# Patient Record
Sex: Female | Born: 1965 | Race: Black or African American | Hispanic: No | Marital: Single | Smoking: Former smoker
Health system: Southern US, Community
[De-identification: ages and names within clinical notes are randomized; demographics above are authoritative.]

## PROBLEM LIST (undated history)

## (undated) DIAGNOSIS — F419 Anxiety disorder, unspecified: Secondary | ICD-10-CM

## (undated) DIAGNOSIS — E785 Hyperlipidemia, unspecified: Secondary | ICD-10-CM

## (undated) DIAGNOSIS — I10 Essential (primary) hypertension: Secondary | ICD-10-CM

## (undated) DIAGNOSIS — H547 Unspecified visual loss: Secondary | ICD-10-CM

## (undated) DIAGNOSIS — C801 Malignant (primary) neoplasm, unspecified: Secondary | ICD-10-CM

## (undated) DIAGNOSIS — I639 Cerebral infarction, unspecified: Secondary | ICD-10-CM

## (undated) HISTORY — PX: HERNIA REPAIR: SHX51

## (undated) HISTORY — DX: Unspecified visual loss: H54.7

## (undated) HISTORY — DX: Cerebral infarction, unspecified: I63.9

---

## 1999-09-22 ENCOUNTER — Ambulatory Visit (HOSPITAL_COMMUNITY): Admission: RE | Admit: 1999-09-22 | Discharge: 1999-09-22 | Payer: Self-pay | Admitting: General Surgery

## 1999-09-22 ENCOUNTER — Encounter: Payer: Self-pay | Admitting: General Surgery

## 1999-10-20 ENCOUNTER — Ambulatory Visit (HOSPITAL_COMMUNITY): Admission: RE | Admit: 1999-10-20 | Discharge: 1999-10-21 | Payer: Self-pay | Admitting: General Surgery

## 2000-02-06 ENCOUNTER — Emergency Department (HOSPITAL_COMMUNITY): Admission: EM | Admit: 2000-02-06 | Discharge: 2000-02-06 | Payer: Self-pay | Admitting: Emergency Medicine

## 2000-09-22 ENCOUNTER — Emergency Department (HOSPITAL_COMMUNITY): Admission: EM | Admit: 2000-09-22 | Discharge: 2000-09-22 | Payer: Self-pay | Admitting: Emergency Medicine

## 2001-05-15 ENCOUNTER — Emergency Department (HOSPITAL_COMMUNITY): Admission: EM | Admit: 2001-05-15 | Discharge: 2001-05-15 | Payer: Self-pay | Admitting: *Deleted

## 2005-07-10 ENCOUNTER — Emergency Department (HOSPITAL_COMMUNITY): Admission: EM | Admit: 2005-07-10 | Discharge: 2005-07-10 | Payer: Self-pay | Admitting: Emergency Medicine

## 2005-08-11 ENCOUNTER — Emergency Department (HOSPITAL_COMMUNITY): Admission: EM | Admit: 2005-08-11 | Discharge: 2005-08-11 | Payer: Self-pay | Admitting: Emergency Medicine

## 2007-06-07 ENCOUNTER — Encounter: Payer: Self-pay | Admitting: Pulmonary Disease

## 2007-06-07 DIAGNOSIS — R911 Solitary pulmonary nodule: Secondary | ICD-10-CM | POA: Insufficient documentation

## 2007-06-07 DIAGNOSIS — J984 Other disorders of lung: Secondary | ICD-10-CM | POA: Insufficient documentation

## 2007-07-11 ENCOUNTER — Ambulatory Visit: Payer: Self-pay | Admitting: Internal Medicine

## 2007-07-11 DIAGNOSIS — C3490 Malignant neoplasm of unspecified part of unspecified bronchus or lung: Secondary | ICD-10-CM | POA: Insufficient documentation

## 2007-07-11 DIAGNOSIS — Z87891 Personal history of nicotine dependence: Secondary | ICD-10-CM | POA: Insufficient documentation

## 2007-07-15 ENCOUNTER — Ambulatory Visit: Payer: Self-pay | Admitting: Cardiovascular Disease

## 2007-08-24 ENCOUNTER — Emergency Department (HOSPITAL_COMMUNITY): Admission: EM | Admit: 2007-08-24 | Discharge: 2007-08-24 | Payer: Self-pay | Admitting: Emergency Medicine

## 2007-08-30 ENCOUNTER — Emergency Department (HOSPITAL_COMMUNITY): Admission: EM | Admit: 2007-08-30 | Discharge: 2007-08-30 | Payer: Self-pay | Admitting: Emergency Medicine

## 2009-02-25 ENCOUNTER — Emergency Department (HOSPITAL_COMMUNITY): Admission: EM | Admit: 2009-02-25 | Discharge: 2009-02-25 | Payer: Self-pay | Admitting: Emergency Medicine

## 2009-05-01 ENCOUNTER — Emergency Department (HOSPITAL_COMMUNITY): Admission: EM | Admit: 2009-05-01 | Discharge: 2009-05-01 | Payer: Self-pay | Admitting: Emergency Medicine

## 2010-02-13 ENCOUNTER — Emergency Department (HOSPITAL_COMMUNITY)
Admission: EM | Admit: 2010-02-13 | Discharge: 2010-02-13 | Payer: Self-pay | Source: Home / Self Care | Admitting: Emergency Medicine

## 2010-03-02 ENCOUNTER — Encounter: Payer: Self-pay | Admitting: Obstetrics & Gynecology

## 2010-05-03 ENCOUNTER — Inpatient Hospital Stay (HOSPITAL_COMMUNITY)
Admission: AD | Admit: 2010-05-03 | Discharge: 2010-05-03 | Disposition: A | Discharge status: Home / Self Care | Payer: Self-pay | Source: Ambulatory Visit | Attending: Obstetrics & Gynecology | Admitting: Obstetrics & Gynecology

## 2010-05-03 DIAGNOSIS — IMO0002 Reserved for concepts with insufficient information to code with codable children: Secondary | ICD-10-CM | POA: Insufficient documentation

## 2010-05-03 DIAGNOSIS — R109 Unspecified abdominal pain: Secondary | ICD-10-CM | POA: Insufficient documentation

## 2010-05-03 LAB — COMPREHENSIVE METABOLIC PANEL
Alkaline Phosphatase: 61 U/L (ref 39–117)
BUN: 8 mg/dL (ref 6–23)
CO2: 26 mEq/L (ref 19–32)
GFR calc non Af Amer: >60 mL/min (ref >60)
Glucose, Bld: 87 mg/dL (ref 70–99)
Potassium: 4.3 mEq/L (ref 3.5–5.1)
Total Bilirubin: 0.4 mg/dL (ref 0.3–1.2)
Total Protein: 6.8 g/dL (ref 6.0–8.3)

## 2010-05-03 LAB — URINALYSIS, ROUTINE W REFLEX MICROSCOPIC
Leukocytes, UA: NEGATIVE
Nitrite: NEGATIVE
Specific Gravity, Urine: 1.01 (ref 1.005–1.030)
pH: 5 (ref 5.0–8.0)

## 2010-05-03 LAB — CBC
HCT: 38.1 % (ref 36.0–46.0)
MCV: 91.6 fL (ref 78.0–100.0)
RDW: 13.8 % (ref 11.5–15.5)
WBC: 6.1 10*3/uL (ref 4.0–10.5)

## 2010-05-03 LAB — POCT PREGNANCY, URINE: Preg Test, Ur: NEGATIVE

## 2010-05-03 LAB — URINE MICROSCOPIC-ADD ON

## 2010-05-05 LAB — CBC
Hemoglobin: 12.5 g/dL (ref 12.0–15.0)
RBC: 4.03 MIL/uL (ref 3.87–5.11)
WBC: 5.1 10*3/uL (ref 4.0–10.5)

## 2010-05-05 LAB — DIFFERENTIAL
Lymphocytes Relative: 14 % (ref 12–46)
Monocytes Absolute: 0.6 10*3/uL (ref 0.1–1.0)
Monocytes Relative: 11 % (ref 3–12)
Neutro Abs: 3.7 10*3/uL (ref 1.7–7.7)

## 2010-05-05 LAB — BASIC METABOLIC PANEL
CO2: 26 mEq/L (ref 19–32)
Calcium: 9.1 mg/dL (ref 8.4–10.5)
GFR calc Af Amer: >60 mL/min (ref >60)
GFR calc non Af Amer: >60 mL/min (ref >60)
Sodium: 134 mEq/L — ABNORMAL LOW (ref 135–145)

## 2010-06-27 NOTE — Op Note (Signed)
[place]  Patient:    Andrea Mullen, Andrea Mullen                       MRN: [id number] Proc. Date: 10/20/99 Adm. Date:  [id number] Attending:  Brandy Hale                           Operative Report  PREOPERATIVE DIAGNOSIS:  Ventral incisional hernia.  POSTOPERATIVE DIAGNOSIS:  Incarcerated ventral incisional hernia.  OPERATION PERFORMED:  Laparoscopic repair of incarcerated ventral incisional hernia with dual mesh.  SURGEON:  Angelia Mould. Derrell Lolling, M.D.  ASSISTANT:  Adolph Pollack, M.D.  ANESTHESIA:  INDICATIONS FOR PROCEDURE:  This is a 45 year old black female who has had eight pregnancies and has seven children.  She has had three cesarean sections in the past.  She presents with abdominal pain around her umbilicus.  On exam, she has a ventral hernia at the umbilicus and below the umbilicus associated with the lower midline incision which extends up to the umbilicus.  Options for intervention have been discussed.  We have decided to go ahead with the laparoscopic repair of her ventral incisional hernia.  OPERATIVE FINDINGS:  The patient had an incarcerated ventral hernia with the defect approximately 3 to 4 cm in diameter and there was incarcerated omentum in the hernia.  The large intestine, small intestine, stomach and liver looked normal.  The lower midline looked perfectly intact and I saw no other hernias anywhere.  DESCRIPTION OF PROCEDURE:  Following the induction of general endotracheal anesthesia, a Foley catheter was inserted.  The abdomen was prepped and draped in a sterile fashion.  0.5% Marcaine with epinephrine was used as a local infiltration anesthetic.  A transverse incision was made in the far lateral left abdominal wall.  Dissection was carried down to the external oblique fascia which was incised transversely.  The internal oblique muscle was retracted.  The transversalis muscle was incised and divided.  Peritoneum  was elevated and we entered the abdominal cavity under direct vision.  A 10 mm Hasson trocar was inserted and secured with a pursestring suture of 0 Vicryl. Pneumoperitoneum was created.  A video camera was inserted with visualization and the findings were as described above.  A 5 mm trocar was placed in the left upper quadrant and a 5 mm trocar placed in the right upper quadrant.  The incarcerated omentum was dissected out of the hernia sac and returned to the abdominal cavity.  We had no significant bleeding from the omentum.  Using spinal needles, we marked out the hernia boundaries and in fact marked it out quite widely.  We made sure that we had used at least 3 or 4 cm of mesh beyond the hernia sac on all sides.  We basically used a 15 cm x 19 cm elliptical piece of mesh.  Externally, the skin and the mesh were marked with a marking pen to orient [place] to the 12 oclock, 3 oclock, 6 oclock and 9 oclock positions.  Mattress sutures of 0 Novofil were placed on the rough side of the mesh in these four locations.  The mesh was rolled up and then inserted into the abdominal cavity.  With video guidance, the mesh was spread out until it was oriented as it had been marked.  Great care was taken to put the rough side of the mesh on the abdominal wall to keep the smooth side toward  the intestine.  We made small incisions at the 12 oclock, 3 oclock, 6 oclock and 9 oclock positions outside of the perimeter of the mesh.  Using the Endoclose device, we brought the ends of the Novofil suture through all four sites and then tied these securely which suspended the mesh against the abdominal wall.  We used two 5 mm tackers firing about 45 to 50 tacks to secure the mesh further all the way around its perimeter and then an inner rim of tacks was placed.  This made two concentric circles of tacks and spread the mesh out quite nicely and securely well over the hernia defect.  Abdominal and pelvic cavities were  inspected.  There was no bleeding or other abnormality. Trocars were removed under direct vision.  There was no bleeding from the trocar sites.  The pneumoperitoneum was released under direct vision.  After all the pneumoperitoneum was released, all of the large trocar was removed. The fascia in the left abdominal wall was closed with 0 Vicryl suture.  All the incisions were closed with subcuticular sutures of 4-0 Vicryl and Steri-Strips.  Clean bandages were placed and the patient taken to the recovery room in stable condition.  Estimated blood loss was about 15 to 20 cc.  Complications were none.  Sponge, needle and instrument counts were correct. DD:  10/20/99 TD:  10/20/99 Job: [id number]

## 2010-12-07 ENCOUNTER — Emergency Department (HOSPITAL_COMMUNITY)
Admission: EM | Admit: 2010-12-07 | Discharge: 2010-12-08 | Discharge status: Against Medical Advice | Payer: Medicaid Other | Attending: Emergency Medicine | Admitting: Emergency Medicine

## 2010-12-07 DIAGNOSIS — R059 Cough, unspecified: Secondary | ICD-10-CM | POA: Insufficient documentation

## 2010-12-07 DIAGNOSIS — R0602 Shortness of breath: Secondary | ICD-10-CM | POA: Insufficient documentation

## 2010-12-07 DIAGNOSIS — R05 Cough: Secondary | ICD-10-CM | POA: Insufficient documentation

## 2010-12-07 DIAGNOSIS — R509 Fever, unspecified: Secondary | ICD-10-CM | POA: Insufficient documentation

## 2010-12-07 DIAGNOSIS — R0989 Other specified symptoms and signs involving the circulatory and respiratory systems: Secondary | ICD-10-CM | POA: Insufficient documentation

## 2010-12-08 ENCOUNTER — Emergency Department (HOSPITAL_COMMUNITY): Payer: Medicaid Other

## 2011-06-01 ENCOUNTER — Encounter (HOSPITAL_COMMUNITY): Payer: Self-pay | Admitting: Emergency Medicine

## 2011-06-01 ENCOUNTER — Emergency Department (HOSPITAL_COMMUNITY): Payer: Medicaid Other

## 2011-06-01 ENCOUNTER — Emergency Department (HOSPITAL_COMMUNITY)
Admission: EM | Admit: 2011-06-01 | Discharge: 2011-06-01 | Disposition: A | Discharge status: Home / Self Care | Payer: Medicaid Other | Attending: Emergency Medicine | Admitting: Emergency Medicine

## 2011-06-01 DIAGNOSIS — R079 Chest pain, unspecified: Secondary | ICD-10-CM | POA: Insufficient documentation

## 2011-06-01 DIAGNOSIS — F419 Anxiety disorder, unspecified: Secondary | ICD-10-CM

## 2011-06-01 DIAGNOSIS — F17200 Nicotine dependence, unspecified, uncomplicated: Secondary | ICD-10-CM | POA: Insufficient documentation

## 2011-06-01 DIAGNOSIS — J45909 Unspecified asthma, uncomplicated: Secondary | ICD-10-CM | POA: Insufficient documentation

## 2011-06-01 DIAGNOSIS — F411 Generalized anxiety disorder: Secondary | ICD-10-CM | POA: Insufficient documentation

## 2011-06-01 DIAGNOSIS — Z85118 Personal history of other malignant neoplasm of bronchus and lung: Secondary | ICD-10-CM | POA: Insufficient documentation

## 2011-06-01 DIAGNOSIS — R42 Dizziness and giddiness: Secondary | ICD-10-CM | POA: Insufficient documentation

## 2011-06-01 DIAGNOSIS — R51 Headache: Secondary | ICD-10-CM | POA: Insufficient documentation

## 2011-06-01 HISTORY — DX: Anxiety disorder, unspecified: F41.9

## 2011-06-01 HISTORY — DX: Malignant (primary) neoplasm, unspecified: C80.1

## 2011-06-01 LAB — CBC
HCT: 37.7 % (ref 36.0–46.0)
Hemoglobin: 13.2 g/dL (ref 12.0–15.0)
MCH: 30.9 pg (ref 26.0–34.0)
MCHC: 35 g/dL (ref 30.0–36.0)
RBC: 4.27 MIL/uL (ref 3.87–5.11)

## 2011-06-01 LAB — BASIC METABOLIC PANEL
BUN: 8 mg/dL (ref 6–23)
CO2: 25 mEq/L (ref 19–32)
GFR calc non Af Amer: 82 mL/min — ABNORMAL LOW (ref >90)
Glucose, Bld: 99 mg/dL (ref 70–99)
Potassium: 3.9 mEq/L (ref 3.5–5.1)

## 2011-06-01 LAB — URINE DRUGS OF ABUSE SCREEN W ALC, ROUTINE (REF LAB)
Barbiturate Quant, Ur: NEGATIVE
Benzodiazepines.: NEGATIVE
Creatinine,U: 118.3 mg/dL
Marijuana Metabolite: NEGATIVE
Methadone: NEGATIVE
Phencyclidine (PCP): NEGATIVE

## 2011-06-01 LAB — POCT I-STAT TROPONIN I

## 2011-06-01 MED ORDER — ASPIRIN 81 MG PO CHEW
324.0000 mg | CHEWABLE_TABLET | Freq: Once | ORAL | Status: AC
  Administered 2011-06-01: 324 mg via ORAL
  Filled 2011-06-01: qty 4

## 2011-06-01 NOTE — ED Notes (Signed)
Pt d/c home in NAD. Pt voiced understanding of d/c instructions and follow up care. Pt ambulated with quick, steady gait and is no longer c/o pain.

## 2011-06-01 NOTE — Discharge Instructions (Signed)
Read instructions below for reasons to return to the Emergency Department. It is recommended that your follow up with your Primary Care Doctor in regards to today's visit.   Chest Pain (Nonspecific)  HOME CARE INSTRUCTIONS  For the next few days, avoid physical activities that bring on chest pain. Continue physical activities as directed.  Do not smoke cigarettes or drink alcohol until your symptoms are gone.  Only take over-the-counter or prescription medicine for pain, discomfort, or fever as directed by your caregiver.  Follow your caregiver's suggestions for further testing if your chest pain does not go away.  Keep any follow-up appointments you made. If you do not go to an appointment, you could develop lasting (chronic) problems with pain. If there is any problem keeping an appointment, you must call to reschedule.  SEEK MEDICAL CARE IF:  You think you are having problems from the medicine you are taking. Read your medicine instructions carefully.  Your chest pain does not go away, even after treatment.  You develop a rash with blisters on your chest.  SEEK IMMEDIATE MEDICAL CARE IF:  You have increased chest pain or pain that spreads to your arm, neck, jaw, back, or belly (abdomen).  You develop shortness of breath, an increasing cough, or you are coughing up blood.  You have severe back or abdominal pain, feel sick to your stomach (nauseous) or throw up (vomit).  You develop severe weakness, fainting, or chills.  You have an oral temperature above 102 F (38.9 C), not controlled by medicine.   THIS IS AN EMERGENCY. Do not wait to see if the pain will go away. Get medical help at once. Call your local emergency services (911 in [place] Do not drive yourself to the hospital.   RESOURCE GUIDE  Dental Problems  Patients with Medicaid: [facility]                      Dental [address]                                           [address] Phone:  [phone]                                                  Phone:  [phone]  If unable to pay or uninsured, contact:  [facility] or [facility] to become qualified for the adult dental clinic.  Chronic Pain Problems Contact [facility]  [phone] Patients need to be referred by their primary care doctor.  Insufficient Money for Medicine Contact [employer]:  call "211" or [facility] 271-5999.  No Primary Care Doctor Call [facility]  832-8000 Other agencies that provide inexpensive medical care    [facility]  832-8035    [facility]  832-7272    [facility]  [phone]    Women[facility] Clinic  832-4777    [facility]  [phone]    [facility]  [phone]  Psychological Services [facility]  [phone] [facility]  [phone] [facility]   800 853-5163 (emergency services 641-4993)  Substance Abuse Resources [facility]    336-882-2125 [facility] 336-784-9470 [facility] 336-285-9073 [facility] 336-845-3988 Residential & Outpatient Substance Abuse Program  800-659-3381  Abuse/Neglect [place] Child Abuse Hotline (336) 641-3795 [place] Child Abuse Hotline 800-378-5315 (After Hours)  Emergency Shelter [facility] (336) 271-5985  [facility] (336) 275-9566 [facility] (704) 372-4663  MRSA Hotline #:   832-7006    [place] [facility]  [facility] [facility]     [employer]                          [facility] [address] [place]                       [address]      [address]  [place]                                                [place]                            [place] Phone:  [phone]                                   Phone:  [phone]                  Phone:  [phone]  [facility] Phone:  [phone]  [place] Child Abuse Hotline (336) 342-1394 (336) 342-3537 (After Hours)   

## 2011-06-01 NOTE — ED Provider Notes (Signed)
Medical screening examination/treatment/procedure(s) were performed by non-physician practitioner and as supervising physician I was immediately available for consultation/collaboration.  Devondre Guzzetta, MD 06/01/11 1621 

## 2011-06-01 NOTE — ED Notes (Signed)
Per ems- pt having complaints of headache x1 day. Left sided chest pain in triage. Upon arrival pt began complaining of upper abdominal cramping. Pt recently ran out of meds.

## 2011-06-01 NOTE — ED Notes (Addendum)
Pt c/o headache, abd cramping, and pain under left breast. Pt. States she hasnt had Xanax in 2 months and requesting to be seen for anxiety and states "that may be part of the problem." Pt states that she almost passed out last night, was "seeing white spots" also states that she does not use drugs but "sells and touches a lot of cocaine".

## 2011-06-01 NOTE — ED Provider Notes (Signed)
History     CSN: [id number]  Arrival date & time 06/01/11  1126   First MD Initiated Contact with Patient 06/01/11 1137      Chief Complaint  Patient presents with  . Headache    (Consider location/radiation/quality/duration/timing/severity/associated sxs/prior treatment) Patient is a 46 y.o. female presenting with headaches and chest pain. The history is provided by the patient.  Headache  This is a new problem. The current episode started 12 to 24 hours ago. The problem occurs every few hours. The problem has been gradually improving. The headache is associated with nothing. The pain is located in the frontal and temporal region. The quality of the pain is described as throbbing. The pain is at a severity of 7/10. The pain is mild. The pain does not radiate. Pertinent negatives include no anorexia, no fever, no malaise/fatigue, no chest pressure, no near-syncope, no orthopnea, no palpitations, no syncope, no shortness of breath, no nausea and no vomiting. Associated symptoms comments: Denies cough, hemoptysis, current cancer, recent surgery. .  Chest Pain The chest pain began 3 - 5 hours ago. Duration of episode(s) is 5 minutes. Chest pain occurs constantly. The chest pain is resolved. Associated with: non exertional, came on after drinking a cup of coffee. At its most intense, the pain is at 6/10. The pain is currently at 0/10. The severity of the pain is mild. The quality of the pain is described as squeezing and tightness. The pain does not radiate. Primary symptoms include dizziness. Pertinent negatives for primary symptoms include no fever, no fatigue, no syncope, no shortness of breath, no cough, no wheezing, no palpitations, no abdominal pain, no nausea, no vomiting and no altered mental status.  Dizziness does not occur with nausea, vomiting, weakness or diaphoresis.  Pertinent negatives for associated symptoms include no claudication, no diaphoresis, no lower extremity edema, no  near-syncope, no numbness, no orthopnea, no paroxysmal nocturnal dyspnea and no weakness. She tried nothing for the symptoms. Risk factors include obesity, lack of exercise and substance abuse.  Her past medical history is significant for cancer (chemo and radiation, treated lung cancer).  Pertinent negatives for past medical history include no COPD, no CHF, no DVT, no PE and no PVD.     Past Medical History  Diagnosis Date  . Cancer   . Asthma   . Anxiety     Past Surgical History  Procedure Date  . Hernia repair   . Cesarean section     No family history on file.  History  Substance Use Topics  . Smoking status: Current Everyday Smoker    Types: Cigarettes  . Smokeless tobacco: Not on file  . Alcohol Use: 3.6 oz/week    6 Cans of beer per week    OB History    Grav Para Term Preterm Abortions TAB SAB Ect Mult Living                  Review of Systems  Constitutional: Negative for fever, malaise/fatigue, diaphoresis and fatigue.  HENT: Negative for neck stiffness and ear discharge.   Respiratory: Negative for cough, shortness of breath and wheezing.   Cardiovascular: Positive for chest pain. Negative for palpitations, orthopnea, claudication, leg swelling, syncope and near-syncope.  Gastrointestinal: Negative for nausea, vomiting, abdominal pain, diarrhea, constipation and anorexia.  Neurological: Positive for dizziness and headaches. Negative for weakness and numbness.  Psychiatric/Behavioral: Negative for altered mental status.  All other systems reviewed and are negative.    Allergies  Bicillin c-r  Home Medications   Current Outpatient Rx  Name Route Sig Dispense Refill  . ALBUTEROL SULFATE HFA 108 (90 BASE) MCG/ACT IN AERS Inhalation Inhale 2 puffs into the lungs every 6 (six) hours as needed. For shortness of breath    . FLEXERIL PO Oral Take 1 tablet by mouth at bedtime as needed. For muscle spasms    . DOCUSATE SODIUM 100 MG PO CAPS Oral Take 100 mg  by mouth daily.    Marland Kitchen FERROUS SULFATE 325 (65 FE) MG PO TABS Oral Take 325 mg by mouth daily.    Marland Kitchen OMEPRAZOLE 20 MG PO CPDR Oral Take 20 mg by mouth daily as needed. For acid reflux      BP 108/67  Pulse 74  Resp 14  SpO2 100%  LMP 05/13/2011  Physical Exam  Nursing note and vitals reviewed. Constitutional: She appears well-developed and well-nourished. No distress.  HENT:  Head: Normocephalic and atraumatic.  Eyes: Conjunctivae and EOM are normal. Pupils are equal, round, and reactive to light.  Neck: Normal range of motion. Neck supple. Normal carotid pulses and no JVD present. Carotid bruit is not present. No rigidity. Normal range of motion present.  Cardiovascular: Normal rate, regular rhythm, S1 normal, S2 normal, normal heart sounds, intact distal pulses and normal pulses.  Exam reveals no gallop and no friction rub.   No murmur heard.      No pitting edema bilaterally, RRR, no aberrant sounds on auscultations, distal pulses intact, no carotid bruit or JVD.   Pulmonary/Chest: Effort normal and breath sounds normal. No accessory muscle usage or stridor. No respiratory distress. She exhibits no tenderness and no bony tenderness.  Abdominal: Bowel sounds are normal.       Obese, soft non tender. Non pulsatile aorta.   Neurological:       Cranial nerves III through XII intact, no bidirectional or vertical nystagmus, coordination and distal sensation intact, normal gait without ataxia.  Skin: Skin is warm, dry and intact. No rash noted. She is not diaphoretic. No cyanosis. Nails show no clubbing.    ED Course  Procedures (including critical care time)   Labs Reviewed  DRUGS OF ABUSE SCREEN W ALC, ROUTINE URINE  CBC  BASIC METABOLIC PANEL   No results found.   No diagnosis found.   Date: 06/01/2011  Rate: 82  Rhythm: normal sinus rhythm  QRS Axis: normal  Intervals: normal  ST/T Wave abnormalities: normal  Conduction Disutrbances: none  Narrative Interpretation:    Old EKG Reviewed: No significant changes noted     MDM  CP, HA, Anxiety   Patient was completely asymptomatic throughout her hospital stay in emergency department.  Symptoms began this morning and were intermittent however have since resolved.  No abnormalities found on physical exam.  Patient did not have a cardiac history nor a family history of early cardiac events.  Presentation non-concerning for ACS. Pt not concerning for PE bc she is not having active CP, SOB and VSS.  EKG within normal limits as well as negative troponins. Pt has been advised to follow up with her PCP this week. She verbalizes understanding.        Jaci Carrel, [place] 06/01/11 1357

## 2011-06-04 LAB — COCAINE, URINE, CONFIRMATION: Benzoylecgonine GC/MS Conf: 2545 NG/ML — ABNORMAL HIGH

## 2011-07-15 ENCOUNTER — Other Ambulatory Visit (HOSPITAL_COMMUNITY): Payer: Self-pay | Admitting: Neurology

## 2011-07-15 DIAGNOSIS — R55 Syncope and collapse: Secondary | ICD-10-CM

## 2011-07-17 ENCOUNTER — Encounter (INDEPENDENT_AMBULATORY_CARE_PROVIDER_SITE_OTHER): Payer: Medicaid Other

## 2011-07-17 DIAGNOSIS — R55 Syncope and collapse: Secondary | ICD-10-CM

## 2011-07-21 ENCOUNTER — Ambulatory Visit (HOSPITAL_COMMUNITY): Payer: Medicaid Other | Attending: Internal Medicine

## 2011-07-21 DIAGNOSIS — F17200 Nicotine dependence, unspecified, uncomplicated: Secondary | ICD-10-CM | POA: Insufficient documentation

## 2011-07-21 DIAGNOSIS — Z9221 Personal history of antineoplastic chemotherapy: Secondary | ICD-10-CM | POA: Insufficient documentation

## 2011-07-21 DIAGNOSIS — R55 Syncope and collapse: Secondary | ICD-10-CM | POA: Insufficient documentation

## 2011-07-21 DIAGNOSIS — Z902 Acquired absence of lung [part of]: Secondary | ICD-10-CM | POA: Insufficient documentation

## 2011-07-21 DIAGNOSIS — C3490 Malignant neoplasm of unspecified part of unspecified bronchus or lung: Secondary | ICD-10-CM | POA: Insufficient documentation

## 2011-07-21 DIAGNOSIS — R42 Dizziness and giddiness: Secondary | ICD-10-CM | POA: Insufficient documentation

## 2011-07-21 DIAGNOSIS — I359 Nonrheumatic aortic valve disorder, unspecified: Secondary | ICD-10-CM | POA: Insufficient documentation

## 2011-07-21 DIAGNOSIS — Z923 Personal history of irradiation: Secondary | ICD-10-CM | POA: Insufficient documentation

## 2011-07-21 NOTE — Progress Notes (Signed)
Echocardiogram performed.  

## 2011-07-22 ENCOUNTER — Encounter (HOSPITAL_COMMUNITY): Payer: Self-pay | Admitting: Neurology

## 2011-09-26 ENCOUNTER — Emergency Department (HOSPITAL_COMMUNITY): Payer: Medicaid Other

## 2011-09-26 ENCOUNTER — Emergency Department (HOSPITAL_COMMUNITY)
Admission: EM | Admit: 2011-09-26 | Discharge: 2011-09-26 | Disposition: A | Discharge status: Home / Self Care | Payer: Medicaid Other | Attending: Emergency Medicine | Admitting: Emergency Medicine

## 2011-09-26 ENCOUNTER — Encounter (HOSPITAL_COMMUNITY): Payer: Self-pay

## 2011-09-26 DIAGNOSIS — T148XXA Other injury of unspecified body region, initial encounter: Secondary | ICD-10-CM

## 2011-09-26 DIAGNOSIS — S0083XA Contusion of other part of head, initial encounter: Secondary | ICD-10-CM | POA: Insufficient documentation

## 2011-09-26 DIAGNOSIS — R42 Dizziness and giddiness: Secondary | ICD-10-CM | POA: Insufficient documentation

## 2011-09-26 DIAGNOSIS — S0003XA Contusion of scalp, initial encounter: Secondary | ICD-10-CM | POA: Insufficient documentation

## 2011-09-26 DIAGNOSIS — R51 Headache: Secondary | ICD-10-CM | POA: Insufficient documentation

## 2011-09-26 DIAGNOSIS — S0990XA Unspecified injury of head, initial encounter: Secondary | ICD-10-CM | POA: Insufficient documentation

## 2011-09-26 MED ORDER — ACETAMINOPHEN 325 MG PO TABS
650.0000 mg | ORAL_TABLET | Freq: Once | ORAL | Status: AC
  Administered 2011-09-26: 650 mg via ORAL
  Filled 2011-09-26: qty 2
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
## 2012-07-28 ENCOUNTER — Emergency Department (HOSPITAL_COMMUNITY)
Admission: EM | Admit: 2012-07-28 | Discharge: 2012-07-28 | Disposition: A | Discharge status: Home / Self Care | Payer: Medicaid Other | Source: Home / Self Care | Attending: Family Medicine | Admitting: Family Medicine

## 2012-07-28 ENCOUNTER — Encounter (HOSPITAL_COMMUNITY): Payer: Self-pay | Admitting: Emergency Medicine

## 2012-07-28 DIAGNOSIS — G8929 Other chronic pain: Secondary | ICD-10-CM

## 2012-07-28 DIAGNOSIS — M25569 Pain in unspecified knee: Secondary | ICD-10-CM

## 2012-07-28 MED ORDER — TRAMADOL HCL 50 MG PO TABS: 50.0000 mg | ORAL_TABLET | Freq: Four times a day (QID) | ORAL | Status: DC | PRN

## 2012-07-28 MED ORDER — DICLOFENAC SODIUM 3 % TD GEL: 1.0000 "application " | Freq: Three times a day (TID) | TRANSDERMAL | Status: DC | PRN

## 2012-07-28 MED ORDER — CAPSAICIN IN LIDOCAINE VEHICLE 0.25 % EX CREA: 1.0000 | TOPICAL_CREAM | Freq: Three times a day (TID) | CUTANEOUS | Status: DC | PRN

## 2012-07-28 NOTE — ED Provider Notes (Signed)
History     CSN: [id number]  Arrival date & time 07/28/12  1356   First MD Initiated Contact with Patient 07/28/12 1422      Chief Complaint  Patient presents with  . Fall    (Consider location/radiation/quality/duration/timing/severity/associated sxs/prior treatment) HPI Comments: 47 year old smoker female with history of chronic bilateral knee pain. Here complaining of bilateral knee pain exacerbation since yesterday. Patient states she slipped while going down stairs yesterday she down her left knee begins the wall and the right knee apparently gave out making her almost fall. No bruising, abrasions or swelling. Her knees have been more tender since yesterday. States she's not taking any medications for pain as she has gastrointestinal side effects with ibuprofen and Tylenol.   Past Medical History  Diagnosis Date  . Cancer   . Asthma   . Anxiety     Past Surgical History  Procedure Laterality Date  . Hernia repair    . Cesarean section      No family history on file.  History  Substance Use Topics  . Smoking status: Current Every Day Smoker    Types: Cigarettes  . Smokeless tobacco: Not on file  . Alcohol Use: 3.6 oz/week    6 Cans of beer per week    OB History   Grav Para Term Preterm Abortions TAB SAB Ect Mult Living                  Review of Systems  Constitutional: Negative for fever and chills.  Cardiovascular: Negative for palpitations and leg swelling.  Musculoskeletal: Negative for joint swelling.  Skin: Negative for color change, rash and wound.  Neurological: Negative for dizziness and headaches.  All other systems reviewed and are negative.    Allergies  Bicillin c-r; Ibuprofen; Penicillins; and Tylenol  Home Medications   Current Outpatient Rx  Name  Route  Sig  Dispense  Refill  . cyclobenzaprine (FLEXERIL) 10 MG tablet   Oral   Take 10 mg by mouth every other day as needed. For muscle spasms         . albuterol (PROVENTIL  HFA;VENTOLIN HFA) 108 (90 BASE) MCG/ACT inhaler   Inhalation   Inhale 2 puffs into the lungs every 6 (six) hours as needed. For shortness of breath         . Capsaicin in Lidocaine Vehicle 0.25 % CREA   Apply externally   Apply 1 applicator topically 3 (three) times daily as needed.   1 Tube   0   . Diclofenac Sodium 3 % GEL   Transdermal   Place 1 application onto the skin 3 (three) times daily as needed.   1 Tube   0   . docusate sodium (COLACE) 100 MG capsule   Oral   Take 100 mg by mouth daily.         . Multiple Vitamin (MULTIVITAMIN WITH MINERALS) TABS   Oral   Take 1 tablet by mouth daily.         Marland Kitchen omeprazole (PRILOSEC) 20 MG capsule   Oral   Take 20 mg by mouth daily as needed. For acid reflux         . traMADol (ULTRAM) 50 MG tablet   Oral   Take 1 tablet (50 mg total) by mouth every 6 (six) hours as needed for pain.   15 tablet   0     BP 120/69  Pulse 104  Temp(Src) 98.7 F (37.1 C)  Resp 15  SpO2 100%  LMP 06/28/2012  Physical Exam  Nursing note and vitals reviewed. Constitutional: She is oriented to person, place, and time. She appears well-developed and well-nourished. No distress.  HENT:  Head: Normocephalic and atraumatic.  Eyes: No scleral icterus.  Cardiovascular: Normal heart sounds.   Pulmonary/Chest: Breath sounds normal.  Musculoskeletal:  Right and left knee: No swelling or deformity. No redness, bruising or increased temperature. No palpable effusion. Reported diffused pain with palpation below to the patella in both knees. No hyper laxity on stress valgo or varus. Some crepitus fell with bilateral flexion and extension and also patient reported pain with active and passive movement bilateral. Negative drawer test. Do not feel Baker's cyst. Both lower extremity with normal superficial sensation and dorsal pedal and tibial posterior pulses. Patient is weightbearing in both legs with no limp.   Neurological: She is alert and  oriented to person, place, and time.  Skin: No rash noted. She is not diaphoretic.  No skin abrasions or lacerations.     ED Course  Procedures (including critical care time)  Labs Reviewed - No data to display No results found.   1. Bilateral chronic knee pain       MDM  Treated with tramadol and topical diclofenac. Also prescribed capsaicin/lidocaine.  Orthopedic referral as needed. Supportive care and red flags that should prompt her return to medical attention discussed with patient and provided in writing.        Sharin Grave, MD 07/29/12 1235

## 2012-07-28 NOTE — ED Notes (Signed)
Pt c/o fall yesterday down the steps. Right knee "gave out" and then she fell. Bruised left knee, but both knees are throbbing still. Patient took Naproxen and arthritis medicine with mild relief. Patient is alert and oriented.

## 2012-10-06 ENCOUNTER — Emergency Department (HOSPITAL_COMMUNITY): Payer: Medicaid Other

## 2012-10-06 ENCOUNTER — Encounter (HOSPITAL_COMMUNITY): Payer: Self-pay | Admitting: Adult Health

## 2012-10-06 ENCOUNTER — Emergency Department (HOSPITAL_COMMUNITY)
Admission: EM | Admit: 2012-10-06 | Discharge: 2012-10-07 | Disposition: A | Discharge status: Home / Self Care | Payer: Medicaid Other | Attending: Emergency Medicine | Admitting: Emergency Medicine

## 2012-10-06 DIAGNOSIS — M25511 Pain in right shoulder: Secondary | ICD-10-CM

## 2012-10-06 DIAGNOSIS — Z888 Allergy status to other drugs, medicaments and biological substances status: Secondary | ICD-10-CM | POA: Insufficient documentation

## 2012-10-06 DIAGNOSIS — Y9389 Activity, other specified: Secondary | ICD-10-CM | POA: Insufficient documentation

## 2012-10-06 DIAGNOSIS — Z79899 Other long term (current) drug therapy: Secondary | ICD-10-CM | POA: Insufficient documentation

## 2012-10-06 DIAGNOSIS — M51369 Other intervertebral disc degeneration, lumbar region without mention of lumbar back pain or lower extremity pain: Secondary | ICD-10-CM

## 2012-10-06 DIAGNOSIS — M5126 Other intervertebral disc displacement, lumbar region: Secondary | ICD-10-CM | POA: Insufficient documentation

## 2012-10-06 DIAGNOSIS — Z881 Allergy status to other antibiotic agents status: Secondary | ICD-10-CM | POA: Insufficient documentation

## 2012-10-06 DIAGNOSIS — J45909 Unspecified asthma, uncomplicated: Secondary | ICD-10-CM | POA: Insufficient documentation

## 2012-10-06 DIAGNOSIS — Y92410 Unspecified street and highway as the place of occurrence of the external cause: Secondary | ICD-10-CM | POA: Insufficient documentation

## 2012-10-06 DIAGNOSIS — F17200 Nicotine dependence, unspecified, uncomplicated: Secondary | ICD-10-CM | POA: Insufficient documentation

## 2012-10-06 DIAGNOSIS — Z880 Allergy status to penicillin: Secondary | ICD-10-CM | POA: Insufficient documentation

## 2012-10-06 DIAGNOSIS — M545 Low back pain, unspecified: Secondary | ICD-10-CM | POA: Insufficient documentation

## 2012-10-06 DIAGNOSIS — R209 Unspecified disturbances of skin sensation: Secondary | ICD-10-CM | POA: Insufficient documentation

## 2012-10-06 DIAGNOSIS — F411 Generalized anxiety disorder: Secondary | ICD-10-CM | POA: Insufficient documentation

## 2012-10-06 DIAGNOSIS — M25519 Pain in unspecified shoulder: Secondary | ICD-10-CM | POA: Insufficient documentation

## 2012-10-06 DIAGNOSIS — Z859 Personal history of malignant neoplasm, unspecified: Secondary | ICD-10-CM | POA: Insufficient documentation

## 2012-10-06 DIAGNOSIS — M5136 Other intervertebral disc degeneration, lumbar region: Secondary | ICD-10-CM

## 2012-10-06 DIAGNOSIS — M542 Cervicalgia: Secondary | ICD-10-CM | POA: Insufficient documentation
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
## 2012-10-07 ENCOUNTER — Emergency Department (HOSPITAL_COMMUNITY): Payer: Medicaid Other

## 2012-10-07 ENCOUNTER — Encounter (HOSPITAL_COMMUNITY): Payer: Self-pay | Admitting: Radiology

## 2012-10-07 MED ORDER — METHOCARBAMOL 500 MG PO TABS: 500.0000 mg | ORAL_TABLET | Freq: Two times a day (BID) | ORAL | Status: DC

## 2012-10-07 MED ORDER — OXYCODONE HCL 5 MG PO TABS: 5.0000 mg | ORAL_TABLET | Freq: Three times a day (TID) | ORAL | Status: DC | PRN

## 2012-10-07 NOTE — ED Notes (Signed)
Pt discharged.Vital signs stable and GCS 15.Pt refused for vital signs.

## 2012-10-09 NOTE — ED Provider Notes (Signed)
Medical screening examination/treatment/procedure(s) were performed by non-physician practitioner and as supervising physician I was immediately available for consultation/collaboration.   Richardean Canal, MD 10/09/12 [phone]

## 2012-11-06 ENCOUNTER — Emergency Department (HOSPITAL_COMMUNITY): Payer: Medicaid Other

## 2012-11-06 ENCOUNTER — Encounter (HOSPITAL_COMMUNITY): Payer: Self-pay | Admitting: *Deleted

## 2012-11-06 ENCOUNTER — Emergency Department (HOSPITAL_COMMUNITY)
Admission: EM | Admit: 2012-11-06 | Discharge: 2012-11-06 | Disposition: A | Discharge status: Home / Self Care | Payer: Medicaid Other | Attending: Emergency Medicine | Admitting: Emergency Medicine

## 2012-11-06 DIAGNOSIS — Z79899 Other long term (current) drug therapy: Secondary | ICD-10-CM | POA: Insufficient documentation

## 2012-11-06 DIAGNOSIS — R1110 Vomiting, unspecified: Secondary | ICD-10-CM | POA: Insufficient documentation

## 2012-11-06 DIAGNOSIS — F17200 Nicotine dependence, unspecified, uncomplicated: Secondary | ICD-10-CM | POA: Insufficient documentation

## 2012-11-06 DIAGNOSIS — J45901 Unspecified asthma with (acute) exacerbation: Secondary | ICD-10-CM | POA: Insufficient documentation

## 2012-11-06 DIAGNOSIS — Z880 Allergy status to penicillin: Secondary | ICD-10-CM | POA: Insufficient documentation

## 2012-11-06 DIAGNOSIS — Z859 Personal history of malignant neoplasm, unspecified: Secondary | ICD-10-CM | POA: Insufficient documentation

## 2012-11-06 DIAGNOSIS — F411 Generalized anxiety disorder: Secondary | ICD-10-CM | POA: Insufficient documentation

## 2012-11-06 MED ORDER — HYDROCODONE-HOMATROPINE 5-1.5 MG/5ML PO SYRP: 2.5000 mL | ORAL_SOLUTION | Freq: Four times a day (QID) | ORAL | Status: DC | PRN

## 2012-11-06 MED ORDER — ALBUTEROL SULFATE (5 MG/ML) 0.5% IN NEBU
5.0000 mg | INHALATION_SOLUTION | Freq: Once | RESPIRATORY_TRACT | Status: AC
  Administered 2012-11-06: 5 mg via RESPIRATORY_TRACT
  Filled 2012-11-06: qty 1

## 2012-11-06 MED ORDER — ALBUTEROL SULFATE HFA 108 (90 BASE) MCG/ACT IN AERS: 2.0000 | INHALATION_SPRAY | Freq: Once | RESPIRATORY_TRACT | Status: DC

## 2012-11-06 MED ORDER — IPRATROPIUM BROMIDE 0.02 % IN SOLN
0.5000 mg | Freq: Once | RESPIRATORY_TRACT | Status: AC
  Administered 2012-11-06: 0.5 mg via RESPIRATORY_TRACT
  Filled 2012-11-06: qty 2.5

## 2012-11-06 MED ORDER — PREDNISONE 20 MG PO TABS
60.0000 mg | ORAL_TABLET | Freq: Once | ORAL | Status: DC
  Filled 2012-11-06: qty 3

## 2012-11-06 MED ORDER — ALBUTEROL SULFATE HFA 108 (90 BASE) MCG/ACT IN AERS: 1.0000 | INHALATION_SPRAY | Freq: Four times a day (QID) | RESPIRATORY_TRACT | Status: DC | PRN

## 2012-11-06 MED ORDER — ALBUTEROL SULFATE (2.5 MG/3ML) 0.083% IN NEBU: 5.0000 mg | INHALATION_SOLUTION | RESPIRATORY_TRACT | Status: DC | PRN

## 2012-11-06 NOTE — ED Provider Notes (Signed)
CSN: [id number]     Arrival date & time 11/06/12  1212 History   First MD Initiated Contact with Patient 11/06/12 1219     Chief Complaint  Patient presents with  . Asthma   Patient is a 47 y.o. female presenting with asthma. The history is provided by the patient.  Asthma This is a recurrent problem. The current episode started 6 to 12 hours ago. The problem occurs hourly. The problem has been gradually worsening. Associated symptoms include shortness of breath. Pertinent negatives include no chest pain. Exacerbated by: activity. The symptoms are relieved by rest. She has tried rest for the symptoms.  Pt reports she woke up with cough/wheeze and shortness of breath She reports she has run out of her asthma meds No active CP She denies h/o ICU admissions She reports she is a smoker but she is trying to quit   Past Medical History  Diagnosis Date  . Cancer   . Asthma   . Anxiety    Past Surgical History  Procedure Laterality Date  . Hernia repair    . Cesarean section     History reviewed. No pertinent family history. History  Substance Use Topics  . Smoking status: Current Every Day Smoker    Types: Cigarettes  . Smokeless tobacco: Not on file  . Alcohol Use: 3.6 oz/week    6 Cans of beer per week   OB History   Grav Para Term Preterm Abortions TAB SAB Ect Mult Living                 Review of Systems  Respiratory: Positive for shortness of breath.   Cardiovascular: Negative for chest pain and leg swelling.  Gastrointestinal: Positive for vomiting.       Post tussive emesis   All other systems reviewed and are negative.    Allergies  Prednisone; Bicillin c-r; Ibuprofen; Nsaids; Penicillins; and Tylenol  Home Medications   Current Outpatient Rx  Name  Route  Sig  Dispense  Refill  . albuterol (PROVENTIL HFA;VENTOLIN HFA) 108 (90 BASE) MCG/ACT inhaler   Inhalation   Inhale 2 puffs into the lungs every 6 (six) hours as needed. For shortness of breath         . albuterol (PROVENTIL) (2.5 MG/3ML) 0.083% nebulizer solution   Nebulization   Take 2.5 mg by nebulization every 6 (six) hours as needed for wheezing or shortness of breath.         . methocarbamol (ROBAXIN) 500 MG tablet   Oral   Take 1 tablet (500 mg total) by mouth 2 (two) times daily.   20 tablet   0   . montelukast (SINGULAIR) 10 MG tablet   Oral   Take 10 mg by mouth at bedtime.         . Multiple Vitamin (MULTIVITAMIN WITH MINERALS) TABS   Oral   Take 1 tablet by mouth daily.          BP 93/52  Pulse 110  Temp(Src) 98.4 F (36.9 C) (Oral)  Resp 24  SpO2 98% BP 94/61  Pulse 84  Temp(Src) 98.4 F (36.9 C) (Oral)  Resp 18  Ht 5\' 4"  (1.626 m)  Wt 230 lb (104.327 kg)  BMI 39.46 kg/m2  SpO2 100%  LMP 10/16/2012  Physical Exam CONSTITUTIONAL: Well developed/well nourished HEAD: Normocephalic/atraumatic EYES: EOMI/PERRL ENMT: Mucous membranes moist, uvula midline, normal phonation, no stridor NECK: supple no meningeal signs SPINE:entire spine nontender CV: S1/S2 noted, no murmurs/rubs/gallops noted LUNGS:  mild tachypnea noted, decreased breath sounds bilaterally with scattered wheeze, ABDOMEN: soft, nontender, no rebound or guarding GU:no cva tenderness NEURO: Pt is awake/alert, moves all extremitiesx4 EXTREMITIES: pulses normal, full ROM, no LE edema SKIN: warm, color normal PSYCH: no abnormalities of mood noted  ED Course  Procedures  12:57 PM Pt ambulatory in the ED I suspect she will improve with neb treatment She refuses to take any steroids for her asthma Will reassess after nebs 1:34 PM Pt reports her SOB is improved but reports she feels soreness in throat and her chest Her lungs are clear and improved air entry Will perform CXR/EKG 3:02 PM Pt stable, watching TV She is in no distress and she has no signs of respiratory compromise She denies any active Chest pain at this tme I advised her to continue using her nebs at home and f/u  with PCP I doubt ACS/PE at this time No signs of acute CHF  MDM  No diagnosis found. Nursing notes including past medical history and social history reviewed and considered in documentation Previous records reviewed and considered - recent ED visits reviewed xrays reviewed and considered    Date: 11/06/2012  Rate: 98  Rhythm: normal sinus rhythm  QRS Axis: normal  Intervals: normal  ST/T Wave abnormalities: nonspecific ST changes  Conduction Disutrbances:none  Narrative Interpretation:   Old EKG Reviewed: unchanged    Joya Gaskins, MD 11/06/12 [phone]

## 2012-11-06 NOTE — ED Notes (Addendum)
Pt reports waking up thsi am with asthma and sob. Pt is out of inhaler and neb tx. spo2 98% and speaking in full sentences, states that it feels difficulty to swallow.

## 2012-12-30 ENCOUNTER — Encounter (HOSPITAL_COMMUNITY): Payer: Self-pay | Admitting: Emergency Medicine

## 2012-12-30 ENCOUNTER — Emergency Department (HOSPITAL_COMMUNITY)
Admission: EM | Admit: 2012-12-30 | Discharge: 2012-12-30 | Discharge status: Absent without leave | Payer: Medicaid Other | Source: Home / Self Care

## 2012-12-30 ENCOUNTER — Emergency Department (HOSPITAL_COMMUNITY)
Admission: EM | Admit: 2012-12-30 | Discharge: 2012-12-30 | Disposition: A | Discharge status: Home / Self Care | Payer: Medicaid Other | Source: Home / Self Care

## 2012-12-30 DIAGNOSIS — T148XXA Other injury of unspecified body region, initial encounter: Secondary | ICD-10-CM

## 2012-12-30 DIAGNOSIS — X58XXXA Exposure to other specified factors, initial encounter: Secondary | ICD-10-CM

## 2012-12-30 DIAGNOSIS — M545 Low back pain: Secondary | ICD-10-CM

## 2012-12-30 MED ORDER — TRAMADOL HCL 50 MG PO TABS: 50.0000 mg | ORAL_TABLET | Freq: Four times a day (QID) | ORAL | Status: DC | PRN
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
## 2012-12-30 NOTE — ED Notes (Signed)
Pt called in all waiting areas - no response. 

## 2012-12-30 NOTE — ED Notes (Signed)
Back pain and cold, onset this morning

## 2012-12-30 NOTE — ED Provider Notes (Signed)
Medical screening examination/treatment/procedure(s) were performed by resident physician or non-physician practitioner and as supervising physician I was immediately available for consultation/collaboration.   Barkley Bruns MD.   Linna Hoff, MD 12/30/12 [phone]

## 2012-12-30 NOTE — ED Notes (Signed)
Questioned referral, reviewed with david, mabe np

## 2013-03-11 ENCOUNTER — Emergency Department (HOSPITAL_COMMUNITY): Payer: Medicaid Other

## 2013-03-11 ENCOUNTER — Emergency Department (HOSPITAL_COMMUNITY)
Admission: EM | Admit: 2013-03-11 | Discharge: 2013-03-11 | Discharge status: Absent without leave | Payer: Medicaid Other | Source: Home / Self Care

## 2013-03-11 ENCOUNTER — Emergency Department (HOSPITAL_COMMUNITY)
Admission: EM | Admit: 2013-03-11 | Discharge: 2013-03-11 | Disposition: A | Discharge status: Home / Self Care | Payer: Medicaid Other | Attending: Emergency Medicine | Admitting: Emergency Medicine

## 2013-03-11 ENCOUNTER — Encounter (HOSPITAL_COMMUNITY): Payer: Self-pay | Admitting: Emergency Medicine

## 2013-03-11 DIAGNOSIS — M7660 Achilles tendinitis, unspecified leg: Secondary | ICD-10-CM

## 2013-03-11 DIAGNOSIS — J40 Bronchitis, not specified as acute or chronic: Secondary | ICD-10-CM

## 2013-03-11 DIAGNOSIS — Z8659 Personal history of other mental and behavioral disorders: Secondary | ICD-10-CM | POA: Insufficient documentation

## 2013-03-11 DIAGNOSIS — M65979 Unspecified synovitis and tenosynovitis, unspecified ankle and foot: Secondary | ICD-10-CM | POA: Insufficient documentation

## 2013-03-11 DIAGNOSIS — F17200 Nicotine dependence, unspecified, uncomplicated: Secondary | ICD-10-CM | POA: Insufficient documentation

## 2013-03-11 DIAGNOSIS — Z79899 Other long term (current) drug therapy: Secondary | ICD-10-CM | POA: Insufficient documentation

## 2013-03-11 DIAGNOSIS — J45901 Unspecified asthma with (acute) exacerbation: Secondary | ICD-10-CM | POA: Insufficient documentation

## 2013-03-11 DIAGNOSIS — M659 Synovitis and tenosynovitis, unspecified: Secondary | ICD-10-CM | POA: Insufficient documentation

## 2013-03-11 DIAGNOSIS — M5380 Other specified dorsopathies, site unspecified: Secondary | ICD-10-CM | POA: Insufficient documentation

## 2013-03-11 DIAGNOSIS — Z791 Long term (current) use of non-steroidal anti-inflammatories (NSAID): Secondary | ICD-10-CM | POA: Insufficient documentation

## 2013-03-11 DIAGNOSIS — M62838 Other muscle spasm: Secondary | ICD-10-CM

## 2013-03-11 DIAGNOSIS — Z880 Allergy status to penicillin: Secondary | ICD-10-CM | POA: Insufficient documentation

## 2013-03-11 DIAGNOSIS — Z859 Personal history of malignant neoplasm, unspecified: Secondary | ICD-10-CM | POA: Insufficient documentation

## 2013-03-11 MED ORDER — TRAMADOL HCL 50 MG PO TABS: 50.0000 mg | ORAL_TABLET | Freq: Four times a day (QID) | ORAL | Status: DC | PRN

## 2013-03-11 MED ORDER — METHOCARBAMOL 500 MG PO TABS: 500.0000 mg | ORAL_TABLET | Freq: Two times a day (BID) | ORAL | Status: DC

## 2013-03-11 MED ORDER — AZITHROMYCIN 250 MG PO TABS: 250.0000 mg | ORAL_TABLET | Freq: Every day | ORAL | Status: DC

## 2013-03-11 NOTE — ED Provider Notes (Signed)
Medical screening examination/treatment/procedure(s) were performed by non-physician practitioner and as supervising physician I was immediately available for consultation/collaboration.  EKG Interpretation   None         Hoy Morn, MD 03/11/13 [phone]

## 2013-03-11 NOTE — ED Notes (Signed)
Pt c/o onset L ankle pain onset yesterday. Denies any injuries. Cms intact. States ankle is tender to touch and it hurts when she walks on it

## 2013-03-11 NOTE — Discharge Instructions (Signed)
Achilles Tendinitis Achilles tendinitis is inflammation of the tough, cord-like band that attaches the lower muscles of your leg to your heel (Achilles tendon). It is usually caused by overusing the tendon and joint involved.  CAUSES Achilles tendinitis can happen because of:  A sudden increase in exercise or activity (such as running).  Doing the same exercises or activities (such as jumping) over and over.  Not warming up calf muscles before exercising.  Exercising in shoes that are worn out or not made for exercise.  Having arthritis or a bone growth on the back of the heel bone. This can rub against the tendon and hurt the tendon. SIGNS AND SYMPTOMS The most common symptoms are:  Pain in the back of the leg, just above the heel. The pain usually gets worse with exercise and better with rest.  Stiffness or soreness in the back of the leg, especially in the morning.  Swelling of the skin over the Achilles tendon.  Trouble standing on tiptoe. Sometimes, an Achilles tendon tears (ruptures). Symptoms of an Achilles tendon rupture can include:  Sudden, severe pain in the back of the leg.  Trouble putting weight on the foot or walking normally. DIAGNOSIS Achilles tendinitis will be diagnosed based on symptoms and a physical examination. An X-ray may be done to check if another condition is causing your symptoms. An MRI may be ordered if your health care provider suspects you may have completely torn your tendon, which is called an Achilles tendon rupture.  TREATMENT  Achilles tendinitis usually gets better over time. It can take weeks to months to heal completely. Treatment focuses on treating the symptoms and helping the injury heal. HOME CARE INSTRUCTIONS   Rest your Achilles tendon and avoid activities that cause pain.  Apply ice to the injured area:  Put ice in a plastic bag.  Place a towel between your skin and the bag.  Leave the ice on for 20 minutes, 2 3 times a  day  Try to avoid using the tendon (other than gentle range of motion) while the tendon is painful. Do not resume use until instructed by your health care provider. Then begin use gradually. Do not increase use to the point of pain. If pain does develop, decrease use and continue the above measures. Gradually increase activities that do not cause discomfort until you achieve normal use.  Do exercises to make your calf muscles stronger and more flexible. Your health care provider or physical therapist can recommend exercises for you to do.  Wrap your ankle with an elastic bandage or other wrap. This can help keep your tendon from moving too much. Your health care provider will show you how to wrap your ankle correctly.  Only take over-the-counter or prescription medicines for pain, discomfort, or fever as directed by your health care provider. SEEK MEDICAL CARE IF:   Your pain and swelling increase or pain is uncontrolled with medicines.  You develop new, unexplained symptoms or your symptoms get worse.  You are unable to move your toes or foot.  You develop warmth and swelling in your foot.  You have an unexplained temperature. MAKE SURE YOU:   Understand these instructions.  Will watch your condition.  Will get help right away if you are not doing well or get worse. Document Released: 11/05/2004 Document Revised: 11/16/2012 Document Reviewed: 09/07/2012 [facility] Patient Information 2014 [place].  Bronchitis Bronchitis is swelling (inflammation) of the air tubes leading to your lungs (bronchi). This causes mucus and  a cough. If the swelling gets bad, you may have trouble breathing. HOME CARE   Rest.  Drink enough fluids to keep your pee (urine) clear or pale yellow (unless you have a condition where you have to watch how much you drink).  Only take medicine as told by your doctor. If you were given antibiotic medicines, finish them even if you start to feel  better.  Avoid smoke, irritating chemicals, and strong smells. These make the problem worse. Quit smoking if you smoke. This helps your lungs heal faster.  Use a cool mist humidifier. Change the water in the humidifier every day. You can also sit in the bathroom with hot shower running for 5 10 minutes. Keep the door closed.  See your health care provider as told.  Wash your hands often. GET HELP IF: Your problems do not get better after 1 week. GET HELP RIGHT AWAY IF:   Your fever gets worse.  You have chills.  Your chest hurts.  Your problems breathing get worse.  You have blood in your mucus.  You pass out (faint).  You feel lightheaded.  You have a bad headache.  You throw up (vomit) again and again. MAKE SURE YOU:  Understand these instructions.  Will watch your condition.  Will get help right away if you are not doing well or get worse. Document Released: 07/15/2007 Document Revised: 11/16/2012 Document Reviewed: 09/20/2012 [facility] Patient Information 2014 [place], [place].  Muscle Cramps and Spasms Muscle cramps and spasms occur when a muscle or muscles tighten and you have no control over this tightening (involuntary muscle contraction). They are a common problem and can develop in any muscle. The most common place is in the calf muscles of the leg. Both muscle cramps and muscle spasms are involuntary muscle contractions, but they also have differences:   Muscle cramps are sporadic and painful. They may last a few seconds to a quarter of an hour. Muscle cramps are often more forceful and last longer than muscle spasms.  Muscle spasms may or may not be painful. They may also last just a few seconds or much longer. CAUSES  It is uncommon for cramps or spasms to be due to a serious underlying problem. In many cases, the cause of cramps or spasms is unknown. Some common causes are:   Overexertion.   Overuse from repetitive motions (doing the same thing over and  over).   Remaining in a certain position for a long period of time.   Improper preparation, form, or technique while performing a sport or activity.   Dehydration.   Injury.   Side effects of some medicines.   Abnormally low levels of the salts and ions in your blood (electrolytes), especially potassium and calcium. This could happen if you are taking water pills (diuretics) or you are pregnant.  Some underlying medical problems can make it more likely to develop cramps or spasms. These include, but are not limited to:   Diabetes.   Parkinson disease.   Hormone disorders, such as thyroid problems.   Alcohol abuse.   Diseases specific to muscles, joints, and bones.   Blood vessel disease where not enough blood is getting to the muscles.  HOME CARE INSTRUCTIONS   Stay well hydrated. Drink enough water and fluids to keep your urine clear or pale yellow.  It may be helpful to massage, stretch, and relax the affected muscle.  For tight or tense muscles, use a warm towel, heating pad, or hot shower water  directed to the affected area.  If you are sore or have pain after a cramp or spasm, applying ice to the affected area may relieve discomfort.  Put ice in a plastic bag.  Place a towel between your skin and the bag.  Leave the ice on for 15-20 minutes, 03-04 times a day.  Medicines used to treat a known cause of cramps or spasms may help reduce their frequency or severity. Only take over-the-counter or prescription medicines as directed by your caregiver. SEEK MEDICAL CARE IF:  Your cramps or spasms get more severe, more frequent, or do not improve over time.  MAKE SURE YOU:   Understand these instructions.  Will watch your condition.  Will get help right away if you are not doing well or get worse. Document Released: 07/18/2001 Document Revised: 05/23/2012 Document Reviewed: 01/13/2012 [facility] Patient Information 2014 [place], [place].

## 2013-03-11 NOTE — ED Provider Notes (Signed)
CSN: [id number]     Arrival date & time 03/11/13  1449 History   This chart was scribed for Ignacia Felling, PA working with Hoy Morn, MD, by Esmond Camper ED Scribe. This patient was seen in room TR06C/TR06C and the patient's care was started at 3:45 PM.      Chief Complaint  Patient presents with  . Ankle Pain    The history is provided by the patient. No language interpreter was used.   HPI Comments: Andrea Mullen is a 48 y.o. female who presents to the Emergency Department complaining of left achilles tendon pain that began two days ago.  Pt denies any recent injuries to the area. Pt states that the pain is worsened by weight bearing and ambulation. Pt is able to move the left ankle. Pt states that she has tried epsom salt and warm water soaking without relief. Pt denies any numbness and tingling. Pt does not have an orthopedist.     Past Medical History  Diagnosis Date  . Cancer   . Asthma   . Anxiety    Past Surgical History  Procedure Laterality Date  . Hernia repair    . Cesarean section     History reviewed. No pertinent family history. History  Substance Use Topics  . Smoking status: Current Every Day Smoker    Types: Cigarettes  . Smokeless tobacco: Not on file  . Alcohol Use: 3.6 oz/week    6 Cans of beer per week   OB History   Grav Para Term Preterm Abortions TAB SAB Ect Mult Living                 Review of Systems  Allergies  Prednisone; Bicillin c-r; Ibuprofen; Nsaids; Penicillins; and Tylenol  Home Medications   Current Outpatient Rx  Name  Route  Sig  Dispense  Refill  . albuterol (PROVENTIL HFA;VENTOLIN HFA) 108 (90 BASE) MCG/ACT inhaler   Inhalation   Inhale 2 puffs into the lungs every 6 (six) hours as needed. For shortness of breath         . albuterol (PROVENTIL) (2.5 MG/3ML) 0.083% nebulizer solution   Nebulization   Take 2.5 mg by nebulization every 6 (six) hours as needed for wheezing or shortness of breath.         .  meloxicam (MOBIC) 15 MG tablet   Oral   Take 15 mg by mouth daily.         . Multiple Vitamin (MULTIVITAMIN WITH MINERALS) TABS   Oral   Take 1 tablet by mouth daily.          Triage Vitals: BP 102/79  Pulse 88  Temp(Src) 98.2 F (36.8 C) (Oral)  Resp 16  Ht 5\' 3"  (1.6 m)  Wt 237 lb 11.2 oz (107.82 kg)  BMI 42.12 kg/m2  SpO2 98%  LMP 03/04/2013  Physical Exam  Nursing note and vitals reviewed. Constitutional: She is oriented to person, place, and time. She appears well-developed and well-nourished. No distress.  HENT:  Head: Normocephalic and atraumatic.  Right Ear: External ear normal.  Left Ear: External ear normal.  Mouth/Throat: Oropharynx is clear and moist. No oropharyngeal exudate.  Boggy nasal mucosa  Eyes: Conjunctivae are normal. Pupils are equal, round, and reactive to light. No scleral icterus.  Neck: Normal range of motion. Neck supple.  Cardiovascular: Normal rate, regular rhythm and normal heart sounds.  Exam reveals no gallop and no friction rub.   No murmur heard. Pulmonary/Chest: Effort  normal and breath sounds normal. No respiratory distress. She has no wheezes. She has no rales. She exhibits no tenderness.  Musculoskeletal:       Right ankle: She exhibits normal range of motion, no swelling, no ecchymosis and normal pulse. No tenderness. No lateral malleolus and no medial malleolus tenderness found. Achilles tendon normal. Achilles tendon exhibits no pain and no defect.       Left ankle: She exhibits normal range of motion, no swelling, no ecchymosis and normal pulse. No tenderness. No lateral malleolus and no medial malleolus tenderness found. Achilles tendon exhibits pain. Achilles tendon exhibits no defect.  Lymphadenopathy:    She has no cervical adenopathy.  Neurological: She is alert and oriented to person, place, and time. She exhibits normal muscle tone. Coordination normal.  Skin: Skin is warm and dry. No rash noted. No erythema. No pallor.   Psychiatric: She has a normal mood and affect. Her behavior is normal. Judgment and thought content normal.    ED Course  Procedures (including critical care time)  DIAGNOSTIC STUDIES: Oxygen Saturation is 98% on RA, normal by my interpretation.    COORDINATION OF CARE: 3:53 PM- Pt advised of plan for treatment and pt agrees.     Labs Review Labs Reviewed - No data to display Imaging Review No results found.  EKG Interpretation   None      Results for orders placed during the hospital encounter of 06/01/11  DRUGS OF ABUSE SCREEN W ALC, ROUTINE URINE      Result Value Range   Amphetamine Screen, Ur NEGATIVE  Negative   Marijuana Metabolite NEGATIVE  Negative   Barbiturate Quant, Ur NEGATIVE  Negative   Methadone NEGATIVE  Negative   Propoxyphene NEGATIVE  Negative   Benzodiazepines. NEGATIVE  Negative   Phencyclidine (PCP) NEGATIVE  Negative   Cocaine Metabolites POSITIVE (*) Negative   Opiate Screen, Urine NEGATIVE  Negative   Ethyl Alcohol <10  <10 mg/dL   Creatinine,U 118.3    CBC      Result Value Range   WBC 3.7 (*) 4.0 - 10.5 K/uL   RBC 4.27  3.87 - 5.11 MIL/uL   Hemoglobin 13.2  12.0 - 15.0 g/dL   HCT 37.7  36.0 - 46.0 %   MCV 88.3  78.0 - 100.0 fL   MCH 30.9  26.0 - 34.0 pg   MCHC 35.0  30.0 - 36.0 g/dL   RDW 13.2  11.5 - 15.5 %   Platelets 230  150 - 400 K/uL  BASIC METABOLIC PANEL      Result Value Range   Sodium 134 (*) 135 - 145 mEq/L   Potassium 3.9  3.5 - 5.1 mEq/L   Chloride 99  96 - 112 mEq/L   CO2 25  19 - 32 mEq/L   Glucose, Bld 99  70 - 99 mg/dL   BUN 8  6 - 23 mg/dL   Creatinine, Ser 0.85  0.50 - 1.10 mg/dL   Calcium 9.2  8.4 - 10.5 mg/dL   GFR calc non Af Amer 82 (*) >90 mL/min   GFR calc Af Amer >90  >90 mL/min  COCAINE, URINE, CONFIRMATION      Result Value Range   Benzoylecgonine GC/MS Conf 2545 (*) CUT-OFF: 150 NG/ML  POCT I-STAT TROPONIN I      Result Value Range   Troponin i, poc 0.00  0.00 - 0.08 ng/mL   Comment 3  Dg Ankle Complete Left  03/11/2013   CLINICAL DATA:  Pain at back of heel near Achilles tendon  EXAM: LEFT ANKLE COMPLETE - 3+ VIEW  COMPARISON:  None  FINDINGS: Osseous mineralization normal.  Ankle mortise intact.  No acute fracture, dislocation or bone destruction.  Small plantar and Achilles insertion calcaneal spurs.  IMPRESSION: Calcaneal spurring.  Otherwise negative exam.   Electronically Signed   By: Lavonia Dana M.D.   On: 03/11/2013 15:47     MDM  Left achilles tendonitis Bronchitis  Patient here with multiple complaints including cough, nasal congestion x 3 days, 2 days of left achilles pain, and "muscle spasms to under my breasts" and back.  Imaging negative - no clinical suspicion for achilles tendon rupture, will give pain control, muscle relaxer and zithromax.  I personally performed the services described in this documentation, which was scribed in my presence. The recorded information has been reviewed and is accurate.   Idalia Needle Joelyn Oms, [place] 03/11/13 [phone]

## 2013-10-09 ENCOUNTER — Encounter (HOSPITAL_COMMUNITY): Payer: Self-pay | Admitting: Emergency Medicine

## 2013-10-09 ENCOUNTER — Emergency Department (HOSPITAL_COMMUNITY)
Admission: EM | Admit: 2013-10-09 | Discharge: 2013-10-09 | Disposition: A | Discharge status: Home / Self Care | Payer: Medicaid Other | Attending: Emergency Medicine | Admitting: Emergency Medicine

## 2013-10-09 DIAGNOSIS — Z792 Long term (current) use of antibiotics: Secondary | ICD-10-CM | POA: Diagnosis not present

## 2013-10-09 DIAGNOSIS — Z791 Long term (current) use of non-steroidal anti-inflammatories (NSAID): Secondary | ICD-10-CM | POA: Diagnosis not present

## 2013-10-09 DIAGNOSIS — Z859 Personal history of malignant neoplasm, unspecified: Secondary | ICD-10-CM | POA: Diagnosis not present

## 2013-10-09 DIAGNOSIS — Z8659 Personal history of other mental and behavioral disorders: Secondary | ICD-10-CM | POA: Insufficient documentation

## 2013-10-09 DIAGNOSIS — Z880 Allergy status to penicillin: Secondary | ICD-10-CM | POA: Insufficient documentation

## 2013-10-09 DIAGNOSIS — M25562 Pain in left knee: Secondary | ICD-10-CM

## 2013-10-09 DIAGNOSIS — Z79899 Other long term (current) drug therapy: Secondary | ICD-10-CM | POA: Diagnosis not present

## 2013-10-09 DIAGNOSIS — M25569 Pain in unspecified knee: Secondary | ICD-10-CM | POA: Diagnosis not present

## 2013-10-09 DIAGNOSIS — E669 Obesity, unspecified: Secondary | ICD-10-CM | POA: Insufficient documentation

## 2013-10-09 DIAGNOSIS — F17200 Nicotine dependence, unspecified, uncomplicated: Secondary | ICD-10-CM | POA: Insufficient documentation

## 2013-10-09 DIAGNOSIS — J45909 Unspecified asthma, uncomplicated: Secondary | ICD-10-CM | POA: Diagnosis not present

## 2013-10-09 DIAGNOSIS — G8929 Other chronic pain: Secondary | ICD-10-CM | POA: Insufficient documentation

## 2013-10-09 DIAGNOSIS — M25561 Pain in right knee: Secondary | ICD-10-CM

## 2013-10-09 MED ORDER — MELOXICAM 15 MG PO TABS: 15.0000 mg | ORAL_TABLET | Freq: Every day | ORAL | Status: DC

## 2013-10-09 NOTE — ED Notes (Signed)
Bilateral knee supports applied. Had to put on outside of pants for now due to tight legged jeans. Instructed to wear under clothes when she gets home.

## 2013-10-09 NOTE — Discharge Instructions (Signed)
Read the information below.  Use the prescribed medication as directed.  Please discuss all new medications with your pharmacist.  You may return to the Emergency Department at any time for worsening condition or any new symptoms that concern you.  If you develop uncontrolled pain, weakness or numbness of the extremity, severe discoloration of the skin, or you are unable to walk, return to the ER for a recheck.      Knee Pain The knee is the complex joint between your thigh and your lower leg. It is made up of bones, tendons, ligaments, and cartilage. The bones that make up the knee are:  The femur in the thigh.  The tibia and fibula in the lower leg.  The patella or kneecap riding in the groove on the lower femur. CAUSES  Knee pain is a common complaint with many causes. A few of these causes are:  Injury, such as:  A ruptured ligament or tendon injury.  Torn cartilage.  Medical conditions, such as:  Gout  Arthritis  Infections  Overuse, over training, or overdoing a physical activity. Knee pain can be minor or severe. Knee pain can accompany debilitating injury. Minor knee problems often respond well to self-care measures or get well on their own. More serious injuries may need medical intervention or even surgery. SYMPTOMS The knee is complex. Symptoms of knee problems can vary widely. Some of the problems are:  Pain with movement and weight bearing.  Swelling and tenderness.  Buckling of the knee.  Inability to straighten or extend your knee.  Your knee locks and you cannot straighten it.  Warmth and redness with pain and fever.  Deformity or dislocation of the kneecap. DIAGNOSIS  Determining what is wrong may be very straight forward such as when there is an injury. It can also be challenging because of the complexity of the knee. Tests to make a diagnosis may include:  Your caregiver taking a history and doing a physical exam.  Routine X-rays can be used to  rule out other problems. X-rays will not reveal a cartilage tear. Some injuries of the knee can be diagnosed by:  Arthroscopy a surgical technique by which a small video camera is inserted through tiny incisions on the sides of the knee. This procedure is used to examine and repair internal knee joint problems. Tiny instruments can be used during arthroscopy to repair the torn knee cartilage (meniscus).  Arthrography is a radiology technique. A contrast liquid is directly injected into the knee joint. Internal structures of the knee joint then become visible on X-ray film.  An MRI scan is a non X-ray radiology procedure in which magnetic fields and a computer produce two- or three-dimensional images of the inside of the knee. Cartilage tears are often visible using an MRI scanner. MRI scans have largely replaced arthrography in diagnosing cartilage tears of the knee.  Blood work.  Examination of the fluid that helps to lubricate the knee joint (synovial fluid). This is done by taking a sample out using a needle and a syringe. TREATMENT The treatment of knee problems depends on the cause. Some of these treatments are:  Depending on the injury, proper casting, splinting, surgery, or physical therapy care will be needed.  Give yourself adequate recovery time. Do not overuse your joints. If you begin to get sore during workout routines, back off. Slow down or do fewer repetitions.  For repetitive activities such as cycling or running, maintain your strength and nutrition.  Alternate muscle  groups. For example, if you are a weight lifter, work the upper body on one day and the lower body the next.  Either tight or weak muscles do not give the proper support for your knee. Tight or weak muscles do not absorb the stress placed on the knee joint. Keep the muscles surrounding the knee strong.  Take care of mechanical problems.  If you have flat feet, orthotics or special shoes may help. See your  caregiver if you need help.  Arch supports, sometimes with wedges on the inner or outer aspect of the heel, can help. These can shift pressure away from the side of the knee most bothered by osteoarthritis.  A brace called an "unloader" brace also may be used to help ease the pressure on the most arthritic side of the knee.  If your caregiver has prescribed crutches, braces, wraps or ice, use as directed. The acronym for this is PRICE. This means protection, rest, ice, compression, and elevation.  Nonsteroidal anti-inflammatory drugs (NSAIDs), can help relieve pain. But if taken immediately after an injury, they may actually increase swelling. Take NSAIDs with food in your stomach. Stop them if you develop stomach problems. Do not take these if you have a history of ulcers, stomach pain, or bleeding from the bowel. Do not take without your caregiver's approval if you have problems with fluid retention, heart failure, or kidney problems.  For ongoing knee problems, physical therapy may be helpful.  Glucosamine and chondroitin are over-the-counter dietary supplements. Both may help relieve the pain of osteoarthritis in the knee. These medicines are different from the usual anti-inflammatory drugs. Glucosamine may decrease the rate of cartilage destruction.  Injections of a corticosteroid drug into your knee joint may help reduce the symptoms of an arthritis flare-up. They may provide pain relief that lasts a few months. You may have to wait a few months between injections. The injections do have a small increased risk of infection, water retention, and elevated blood sugar levels.  Hyaluronic acid injected into damaged joints may ease pain and provide lubrication. These injections may work by reducing inflammation. A series of shots may give relief for as long as 6 months.  Topical painkillers. Applying certain ointments to your skin may help relieve the pain and stiffness of osteoarthritis. Ask your  pharmacist for suggestions. Many over the-counter products are approved for temporary relief of arthritis pain.  In some countries, doctors often prescribe topical NSAIDs for relief of chronic conditions such as arthritis and tendinitis. A review of treatment with NSAID creams found that they worked as well as oral medications but without the serious side effects. PREVENTION  Maintain a healthy weight. Extra pounds put more strain on your joints.  Get strong, stay limber. Weak muscles are a common cause of knee injuries. Stretching is important. Include flexibility exercises in your workouts.  Be smart about exercise. If you have osteoarthritis, chronic knee pain or recurring injuries, you may need to change the way you exercise. This does not mean you have to stop being active. If your knees ache after jogging or playing basketball, consider switching to swimming, water aerobics, or other low-impact activities, at least for a few days a week. Sometimes limiting high-impact activities will provide relief.  Make sure your shoes fit well. Choose footwear that is right for your sport.  Protect your knees. Use the proper gear for knee-sensitive activities. Use kneepads when playing volleyball or laying carpet. Buckle your seat belt every time you drive. Most  shattered kneecaps occur in car accidents.  Rest when you are tired. SEEK MEDICAL CARE IF:  You have knee pain that is continual and does not seem to be getting better.  SEEK IMMEDIATE MEDICAL CARE IF:  Your knee joint feels hot to the touch and you have a high fever. MAKE SURE YOU:   Understand these instructions.  Will watch your condition.  Will get help right away if you are not doing well or get worse. Document Released: 11/23/2006 Document Revised: 04/20/2011 Document Reviewed: 11/23/2006 [facility] Patient Information 2015 [place], [place]. This information is not intended to replace advice given to you by your health care provider.  Make sure you discuss any questions you have with your health care provider.

## 2013-10-09 NOTE — ED Provider Notes (Signed)
CSN: [id number]     Arrival date & time 10/09/13  1049 History  This chart was scribed for non-physician practitioner, Clayton Bibles, PA-C,working with Varney Biles, MD, by Marlowe Kays, ED Scribe. This patient was seen in room TR04C/TR04C and the patient's care was started at 11:40 AM.  Chief Complaint  Patient presents with  . Knee Pain   Patient is a 48 y.o. female presenting with knee pain. The history is provided by the patient. No language interpreter was used.  Knee Pain Associated symptoms: no fever    HPI Comments:  Andrea Mullen is a 48 y.o. obese female with PMH of cancer who presents to the Emergency Department complaining of worsening severe aching, throbbing bilateral knee pain that started approximately one year ago. Pt states the left knee hurts worse than the right.  Pain is improved with compression of tights she is currently wearing.  She states the weather makes her pain worse. She reports falling about two months ago but otherwise has had no injuries and has had no acute change in her pain. Does not take anything for pain. She wants a referral to an orthopedist stating she thinks she has arthritis. She denies fevers, leg swelling, tingling or weakness of the lower extremities.   Past Medical History  Diagnosis Date  . Cancer   . Asthma   . Anxiety    Past Surgical History  Procedure Laterality Date  . Hernia repair    . Cesarean section     No family history on file. History  Substance Use Topics  . Smoking status: Current Every Day Smoker    Types: Cigarettes  . Smokeless tobacco: Not on file  . Alcohol Use: 3.6 oz/week    6 Cans of beer per week   OB History   Grav Para Term Preterm Abortions TAB SAB Ect Mult Living                 Review of Systems  Constitutional: Negative for fever.  Cardiovascular: Negative for leg swelling.  Musculoskeletal: Positive for arthralgias. Negative for gait problem.  Skin: Negative for color change and wound.   Allergic/Immunologic: Negative for immunocompromised state.  Neurological: Negative for weakness and numbness.    Allergies  Prednisone; Bicillin c-r; Ibuprofen; Nsaids; Penicillins; and Tylenol  Home Medications   Prior to Admission medications   Medication Sig Start Date End Date Taking? Authorizing Provider  albuterol (PROVENTIL HFA;VENTOLIN HFA) 108 (90 BASE) MCG/ACT inhaler Inhale 2 puffs into the lungs every 6 (six) hours as needed. For shortness of breath    Historical Provider, MD  albuterol (PROVENTIL) (2.5 MG/3ML) 0.083% nebulizer solution Take 2.5 mg by nebulization every 6 (six) hours as needed for wheezing or shortness of breath.    Historical Provider, MD  azithromycin (ZITHROMAX) 250 MG tablet Take 1 tablet (250 mg total) by mouth daily. Take first 2 tablets together, then 1 every day until finished. 03/11/13   Idalia Needle. Sanford, PA-C  meloxicam (MOBIC) 15 MG tablet Take 15 mg by mouth daily.    Historical Provider, MD  methocarbamol (ROBAXIN) 500 MG tablet Take 1 tablet (500 mg total) by mouth 2 (two) times daily. 03/11/13   Idalia Needle. Sanford, PA-C  Multiple Vitamin (MULTIVITAMIN WITH MINERALS) TABS Take 1 tablet by mouth daily.    Historical Provider, MD  traMADol (ULTRAM) 50 MG tablet Take 1 tablet (50 mg total) by mouth every 6 (six) hours as needed. 03/11/13   Idalia Needle. Joelyn Oms, PA-C  Triage Vitals: BP 146/69  Pulse 97  Temp(Src) 97.9 F (36.6 C) (Oral)  Resp 20  Ht 5\' 3"  (1.6 m)  Wt 224 lb (101.606 kg)  BMI 39.69 kg/m2  SpO2 99%  LMP 10/02/2013 Physical Exam  Nursing note and vitals reviewed. Constitutional: She appears well-developed and well-nourished. No distress.  HENT:  Head: Normocephalic and atraumatic.  Neck: Neck supple.  Pulmonary/Chest: Effort normal.  Musculoskeletal: She exhibits tenderness. She exhibits no edema.  Diffused anterior tenderness of bilateral knees. No joint laxity of either knee. Full active ROM of knees bilaterally. Lower  extremities:  Strength 5/5, sensation intact, distal pulses intact.      Neurological: She is alert.  Skin: She is not diaphoretic. No erythema.  No erythema, edema, or warmth of bilateral knees.    ED Course  Procedures (including critical care time) DIAGNOSTIC STUDIES: Oxygen Saturation is 99% on RA, normal by my interpretation.   COORDINATION OF CARE: 11:46 AM- Will order bilateral knee sleeves and give orthopedic referral. Will prescribe pain medication. Pt verbalizes understanding and agrees to plan.  Medications - No data to display  Labs Review Labs Reviewed - No data to display  Imaging Review No results found.   EKG Interpretation None      MDM   Final diagnoses:  Bilateral chronic knee pain    Afebrile, nontoxic patient with chronic bilateral knee pain without injury or acute change. No red flags.  Doubt septic joints.  D/C home with orthopedic follow up, knee sleeves, mobic.  Discussed result, findings, treatment, and follow up  with patient.  Pt given return precautions.  Pt verbalizes understanding and agrees with plan.       I personally performed the services described in this documentation, which was scribed in my presence. The recorded information has been reviewed and is accurate.    Clayton Bibles, PA-C 10/09/13 1553

## 2013-10-09 NOTE — ED Notes (Signed)
Patient states has bilateral knee pain.   Patient states she needs to be referred to orthopedics.  Patient states needs surgery, but has never been diagnosed as such.

## 2013-10-12 NOTE — ED Provider Notes (Signed)
Medical screening examination/treatment/procedure(s) were performed by non-physician practitioner and as supervising physician I was immediately available for consultation/collaboration.   EKG Interpretation None       Varney Biles, MD 10/12/13 0720

## 2013-11-08 ENCOUNTER — Emergency Department (HOSPITAL_COMMUNITY)
Admission: EM | Admit: 2013-11-08 | Discharge: 2013-11-08 | Disposition: A | Discharge status: Home / Self Care | Payer: Medicaid Other | Attending: Emergency Medicine | Admitting: Emergency Medicine

## 2013-11-08 ENCOUNTER — Encounter (HOSPITAL_COMMUNITY): Payer: Self-pay | Admitting: Emergency Medicine

## 2013-11-08 DIAGNOSIS — M7989 Other specified soft tissue disorders: Secondary | ICD-10-CM | POA: Diagnosis not present

## 2013-11-08 DIAGNOSIS — Z79899 Other long term (current) drug therapy: Secondary | ICD-10-CM | POA: Diagnosis not present

## 2013-11-08 DIAGNOSIS — Z880 Allergy status to penicillin: Secondary | ICD-10-CM | POA: Insufficient documentation

## 2013-11-08 DIAGNOSIS — M79609 Pain in unspecified limb: Secondary | ICD-10-CM

## 2013-11-08 DIAGNOSIS — M25569 Pain in unspecified knee: Secondary | ICD-10-CM | POA: Diagnosis present

## 2013-11-08 DIAGNOSIS — F17200 Nicotine dependence, unspecified, uncomplicated: Secondary | ICD-10-CM | POA: Insufficient documentation

## 2013-11-08 DIAGNOSIS — Z791 Long term (current) use of non-steroidal anti-inflammatories (NSAID): Secondary | ICD-10-CM | POA: Diagnosis not present

## 2013-11-08 DIAGNOSIS — Z87891 Personal history of nicotine dependence: Secondary | ICD-10-CM

## 2013-11-08 DIAGNOSIS — G8929 Other chronic pain: Secondary | ICD-10-CM | POA: Diagnosis not present

## 2013-11-08 DIAGNOSIS — F411 Generalized anxiety disorder: Secondary | ICD-10-CM | POA: Diagnosis not present

## 2013-11-08 DIAGNOSIS — M25561 Pain in right knee: Secondary | ICD-10-CM

## 2013-11-08 DIAGNOSIS — Z85118 Personal history of other malignant neoplasm of bronchus and lung: Secondary | ICD-10-CM | POA: Insufficient documentation

## 2013-11-08 DIAGNOSIS — J45909 Unspecified asthma, uncomplicated: Secondary | ICD-10-CM | POA: Diagnosis not present

## 2013-11-08 DIAGNOSIS — Z792 Long term (current) use of antibiotics: Secondary | ICD-10-CM | POA: Diagnosis not present

## 2013-11-08 DIAGNOSIS — C3490 Malignant neoplasm of unspecified part of unspecified bronchus or lung: Secondary | ICD-10-CM

## 2013-11-08 DIAGNOSIS — R209 Unspecified disturbances of skin sensation: Secondary | ICD-10-CM | POA: Diagnosis not present

## 2013-11-08 MED ORDER — TRAMADOL HCL 50 MG PO TABS: 50.0000 mg | ORAL_TABLET | Freq: Four times a day (QID) | ORAL | Status: DC | PRN

## 2013-11-08 MED ORDER — TRAMADOL HCL 50 MG PO TABS
50.0000 mg | ORAL_TABLET | Freq: Once | ORAL | Status: AC
  Administered 2013-11-08: 50 mg via ORAL
  Filled 2013-11-08: qty 1

## 2013-11-08 NOTE — ED Notes (Signed)
Pt states that she has been having bilateral knee pain x 6 months.  Pt states that rt knee is worse.  Swelling and redness noted.  States "I need to go see an orthopedic"

## 2013-11-08 NOTE — Discharge Instructions (Signed)
You have been evaluated for your knee pain.  No evidence of blood clot, infection, or broken bone on today's exam.  Take pain medication as needed but please follow up with your doctor or with orthopedic doctor for further management of your condition.  Continue with exercise and stretching to alleviate your discomfort.    Knee Pain The knee is the complex joint between your thigh and your lower leg. It is made up of bones, tendons, ligaments, and cartilage. The bones that make up the knee are:  The femur in the thigh.  The tibia and fibula in the lower leg.  The patella or kneecap riding in the groove on the lower femur. CAUSES  Knee pain is a common complaint with many causes. A few of these causes are:  Injury, such as:  A ruptured ligament or tendon injury.  Torn cartilage.  Medical conditions, such as:  Gout  Arthritis  Infections  Overuse, over training, or overdoing a physical activity. Knee pain can be minor or severe. Knee pain can accompany debilitating injury. Minor knee problems often respond well to self-care measures or get well on their own. More serious injuries may need medical intervention or even surgery. SYMPTOMS The knee is complex. Symptoms of knee problems can vary widely. Some of the problems are:  Pain with movement and weight bearing.  Swelling and tenderness.  Buckling of the knee.  Inability to straighten or extend your knee.  Your knee locks and you cannot straighten it.  Warmth and redness with pain and fever.  Deformity or dislocation of the kneecap. DIAGNOSIS  Determining what is wrong may be very straight forward such as when there is an injury. It can also be challenging because of the complexity of the knee. Tests to make a diagnosis may include:  Your caregiver taking a history and doing a physical exam.  Routine X-rays can be used to rule out other problems. X-rays will not reveal a cartilage tear. Some injuries of the knee can be  diagnosed by:  Arthroscopy a surgical technique by which a small video camera is inserted through tiny incisions on the sides of the knee. This procedure is used to examine and repair internal knee joint problems. Tiny instruments can be used during arthroscopy to repair the torn knee cartilage (meniscus).  Arthrography is a radiology technique. A contrast liquid is directly injected into the knee joint. Internal structures of the knee joint then become visible on X-ray film.  An MRI scan is a non X-ray radiology procedure in which magnetic fields and a computer produce two- or three-dimensional images of the inside of the knee. Cartilage tears are often visible using an MRI scanner. MRI scans have largely replaced arthrography in diagnosing cartilage tears of the knee.  Blood work.  Examination of the fluid that helps to lubricate the knee joint (synovial fluid). This is done by taking a sample out using a needle and a syringe. TREATMENT The treatment of knee problems depends on the cause. Some of these treatments are:  Depending on the injury, proper casting, splinting, surgery, or physical therapy care will be needed.  Give yourself adequate recovery time. Do not overuse your joints. If you begin to get sore during workout routines, back off. Slow down or do fewer repetitions.  For repetitive activities such as cycling or running, maintain your strength and nutrition.  Alternate muscle groups. For example, if you are a weight lifter, work the upper body on one day and the lower  body the next.  Either tight or weak muscles do not give the proper support for your knee. Tight or weak muscles do not absorb the stress placed on the knee joint. Keep the muscles surrounding the knee strong.  Take care of mechanical problems.  If you have flat feet, orthotics or special shoes may help. See your caregiver if you need help.  Arch supports, sometimes with wedges on the inner or outer aspect of  the heel, can help. These can shift pressure away from the side of the knee most bothered by osteoarthritis.  A brace called an "unloader" brace also may be used to help ease the pressure on the most arthritic side of the knee.  If your caregiver has prescribed crutches, braces, wraps or ice, use as directed. The acronym for this is PRICE. This means protection, rest, ice, compression, and elevation.  Nonsteroidal anti-inflammatory drugs (NSAIDs), can help relieve pain. But if taken immediately after an injury, they may actually increase swelling. Take NSAIDs with food in your stomach. Stop them if you develop stomach problems. Do not take these if you have a history of ulcers, stomach pain, or bleeding from the bowel. Do not take without your caregiver's approval if you have problems with fluid retention, heart failure, or kidney problems.  For ongoing knee problems, physical therapy may be helpful.  Glucosamine and chondroitin are over-the-counter dietary supplements. Both may help relieve the pain of osteoarthritis in the knee. These medicines are different from the usual anti-inflammatory drugs. Glucosamine may decrease the rate of cartilage destruction.  Injections of a corticosteroid drug into your knee joint may help reduce the symptoms of an arthritis flare-up. They may provide pain relief that lasts a few months. You may have to wait a few months between injections. The injections do have a small increased risk of infection, water retention, and elevated blood sugar levels.  Hyaluronic acid injected into damaged joints may ease pain and provide lubrication. These injections may work by reducing inflammation. A series of shots may give relief for as long as 6 months.  Topical painkillers. Applying certain ointments to your skin may help relieve the pain and stiffness of osteoarthritis. Ask your pharmacist for suggestions. Many over the-counter products are approved for temporary relief of  arthritis pain.  In some countries, doctors often prescribe topical NSAIDs for relief of chronic conditions such as arthritis and tendinitis. A review of treatment with NSAID creams found that they worked as well as oral medications but without the serious side effects. PREVENTION  Maintain a healthy weight. Extra pounds put more strain on your joints.  Get strong, stay limber. Weak muscles are a common cause of knee injuries. Stretching is important. Include flexibility exercises in your workouts.  Be smart about exercise. If you have osteoarthritis, chronic knee pain or recurring injuries, you may need to change the way you exercise. This does not mean you have to stop being active. If your knees ache after jogging or playing basketball, consider switching to swimming, water aerobics, or other low-impact activities, at least for a few days a week. Sometimes limiting high-impact activities will provide relief.  Make sure your shoes fit well. Choose footwear that is right for your sport.  Protect your knees. Use the proper gear for knee-sensitive activities. Use kneepads when playing volleyball or laying carpet. Buckle your seat belt every time you drive. Most shattered kneecaps occur in car accidents.  Rest when you are tired. SEEK MEDICAL CARE IF:  You have  knee pain that is continual and does not seem to be getting better.  SEEK IMMEDIATE MEDICAL CARE IF:  Your knee joint feels hot to the touch and you have a high fever. MAKE SURE YOU:   Understand these instructions.  Will watch your condition.  Will get help right away if you are not doing well or get worse. Document Released: 11/23/2006 Document Revised: 04/20/2011 Document Reviewed: 11/23/2006 [facility] Patient Information 2015 [place], [place]. This information is not intended to replace advice given to you by your health care provider. Make sure you discuss any questions you have with your health care provider.

## 2013-11-08 NOTE — ED Provider Notes (Signed)
CSN: [id number]     Arrival date & time 11/08/13  1159 History  This chart was scribed for Domenic Moras, PA with Charlesetta Shanks, MD by Edison Simon, ED Scribe. This patient was seen in room WTR5/WTR5 and the patient's care was started at 12:34 PM.    Chief Complaint  Patient presents with  . Knee Pain   The history is provided by the patient. No language interpreter was used.    HPI Comments: Andrea Mullen is a 48 y.o. female who presents to the Emergency Department complaining of right knee pain worsening this morning with associated headache. She described the pain as sharp, aching, and throbbing. She reports pain every day in both knees with onset 1 year ago, worse at night and with activity. She states her chronic pain is worse when walking and sometimes eases after stopping walking. She reports an intermittent sharp pain to her posterior knee radiating into her calf; she notes pain to her anterior and lateral knee as well. She states she sometimes has numbness to her lateral knee area and notes swelling to her lower extremity. She also reports hip pain. She denies recent surgery or injury. She reports prior lung cancer that is in remission and states she is being screened for breast cancer tomorrow. She denies history of blood clots.   Past Medical History  Diagnosis Date  . Cancer   . Asthma   . Anxiety    Past Surgical History  Procedure Laterality Date  . Hernia repair    . Cesarean section     No family history on file. History  Substance Use Topics  . Smoking status: Current Every Day Smoker    Types: Cigarettes  . Smokeless tobacco: Not on file  . Alcohol Use: 3.6 oz/week    6 Cans of beer per week   OB History   Grav Para Term Preterm Abortions TAB SAB Ect Mult Living                 Review of Systems  Cardiovascular: Positive for leg swelling.  Musculoskeletal: Positive for arthralgias.  Neurological: Positive for numbness and headaches.      Allergies   Prednisone; Bicillin c-r; Ibuprofen; Nsaids; Penicillins; and Tylenol  Home Medications   Prior to Admission medications   Medication Sig Start Date End Date Taking? Authorizing Provider  albuterol (PROVENTIL HFA;VENTOLIN HFA) 108 (90 BASE) MCG/ACT inhaler Inhale 2 puffs into the lungs every 6 (six) hours as needed. For shortness of breath    Historical Provider, MD  albuterol (PROVENTIL) (2.5 MG/3ML) 0.083% nebulizer solution Take 2.5 mg by nebulization every 6 (six) hours as needed for wheezing or shortness of breath.    Historical Provider, MD  azithromycin (ZITHROMAX) 250 MG tablet Take 1 tablet (250 mg total) by mouth daily. Take first 2 tablets together, then 1 every day until finished. 03/11/13   Idalia Needle. Sanford, PA-C  meloxicam (MOBIC) 15 MG tablet Take 15 mg by mouth daily.    Historical Provider, MD  meloxicam (MOBIC) 15 MG tablet Take 1 tablet (15 mg total) by mouth daily. 10/09/13   Clayton Bibles, PA-C  methocarbamol (ROBAXIN) 500 MG tablet Take 1 tablet (500 mg total) by mouth 2 (two) times daily. 03/11/13   Idalia Needle. Sanford, PA-C  Multiple Vitamin (MULTIVITAMIN WITH MINERALS) TABS Take 1 tablet by mouth daily.    Historical Provider, MD  traMADol (ULTRAM) 50 MG tablet Take 1 tablet (50 mg total) by mouth every 6 (six)  hours as needed. 03/11/13   Idalia Needle. Sanford, PA-C   BP 134/72  Pulse 104  Temp(Src) 97.9 F (36.6 C) (Oral)  Resp 20  SpO2 98%  LMP 10/02/2013 Physical Exam  Nursing note and vitals reviewed. Constitutional: She is oriented to person, place, and time. She appears well-developed and well-nourished.  HENT:  Head: Normocephalic and atraumatic.  Eyes: Conjunctivae are normal.  Neck: Normal range of motion. Neck supple.  Pulmonary/Chest: Effort normal.  Musculoskeletal: Normal range of motion.  Right knee tenderness to the medial aspects of knee on palpation without any obvious deformity, no surrounding erythema or warmth, no joint laxity, pain increased with  knee flexion and extension, mild calf tenderness but no significant edema noted, sensation intact, distal pulses intact  Neurological: She is alert and oriented to person, place, and time.  Skin: Skin is warm and dry.  Psychiatric: She has a normal mood and affect.    ED Course  Procedures (including critical care time) Labs Review Labs Reviewed - No data to display  Imaging Review No results found.   EKG Interpretation None     DIAGNOSTIC STUDIES: Oxygen Saturation is 98% on room air, normal by my interpretation.    COORDINATION OF CARE: 12:39 PM She denies recent injury so unlikely to have acute fracture. Since she does have history of cancer as well as pain to her calf area, I am concerned about possibility of DVT and so will order appropriate tests; she denies previous blood clots in her legs or lungs.   1:36 PM Pt is negative for DVT.  Will provide a short course of pain medication for her knee pain but pt will benefit from orthopedic management or PCP for her chronic knee pain.  Return precaution discussed.    Andrea Mullen, Andrea Mullen Female Jun 13, 1965 [id number]            Progress Notes by Charlaine Dalton, RVT at 11/08/2013 1:26 PM    Author: Charlaine Dalton, RVT Service: Vascular Lab Author Type: Cardiovascular Sonographer   Filed: 11/08/2013 1:27 PM Note Time: 11/08/2013 1:26 PM Status: Signed   Editor: Charlaine Dalton, RVT (Cardiovascular Sonographer)      Right lower extremity venous duplex completed. Right: No evidence of DVT, superficial thrombosis, or Baker's cyst. Left: Negative for DVT in the common femoral vein.       MDM   Final diagnoses:  Chronic knee pain, right    BP 134/72  Pulse 104  Temp(Src) 97.9 F (36.6 C) (Oral)  Resp 20  SpO2 98%  LMP 10/02/2013   I have reviewed nursing notes and vital signs. I personally reviewed the imaging tests through PACS system  I reviewed available ER/hospitalization records thought the EMR  I personally  performed the services described in this documentation, which was scribed in my presence. The recorded information has been reviewed and is accurate.     Domenic Moras, PA-C 11/08/13 1341

## 2013-11-08 NOTE — Progress Notes (Signed)
Right lower extremity venous duplex completed.  Right:  No evidence of DVT, superficial thrombosis, or Baker's cyst.  Left:  Negative for DVT in the common femoral vein.  

## 2013-11-08 NOTE — ED Provider Notes (Signed)
Medical screening examination/treatment/procedure(s) were performed by non-physician practitioner and as supervising physician I was immediately available for consultation/collaboration.   EKG Interpretation None       Charlesetta Shanks, MD 11/08/13 [phone]

## 2014-04-07 ENCOUNTER — Encounter (HOSPITAL_COMMUNITY): Payer: Self-pay | Admitting: Emergency Medicine

## 2014-04-07 ENCOUNTER — Emergency Department (HOSPITAL_COMMUNITY): Payer: Medicaid Other

## 2014-04-07 DIAGNOSIS — J45901 Unspecified asthma with (acute) exacerbation: Secondary | ICD-10-CM | POA: Diagnosis not present

## 2014-04-07 DIAGNOSIS — R079 Chest pain, unspecified: Secondary | ICD-10-CM | POA: Diagnosis not present

## 2014-04-07 DIAGNOSIS — J029 Acute pharyngitis, unspecified: Secondary | ICD-10-CM | POA: Insufficient documentation

## 2014-04-07 DIAGNOSIS — Z720 Tobacco use: Secondary | ICD-10-CM | POA: Insufficient documentation

## 2014-04-07 LAB — BASIC METABOLIC PANEL
ANION GAP: 5 (ref 5–15)
BUN: 10 mg/dL (ref 6–23)
CALCIUM: 8.9 mg/dL (ref 8.4–10.5)
CO2: 29 mmol/L (ref 19–32)
Chloride: 101 mmol/L (ref 96–112)
Creatinine, Ser: 0.74 mg/dL (ref 0.50–1.10)
GFR calc Af Amer: >90 mL/min (ref >90)
Glucose, Bld: 92 mg/dL (ref 70–99)
POTASSIUM: 4.1 mmol/L (ref 3.5–5.1)
Sodium: 135 mmol/L (ref 135–145)

## 2014-04-07 LAB — CBC
HCT: 37 % (ref 36.0–46.0)
Hemoglobin: 12.2 g/dL (ref 12.0–15.0)
MCH: 30.2 pg (ref 26.0–34.0)
MCHC: 33 g/dL (ref 30.0–36.0)
MCV: 91.6 fL (ref 78.0–100.0)
Platelets: 255 10*3/uL (ref 150–400)
RBC: 4.04 MIL/uL (ref 3.87–5.11)
RDW: 14 % (ref 11.5–15.5)
WBC: 4.9 10*3/uL (ref 4.0–10.5)

## 2014-04-07 NOTE — ED Notes (Signed)
Pt sts she has been having sore throat, cough, chest pain and sob x 3 days. Denies congestion when asked about chest discomfort.

## 2014-04-08 ENCOUNTER — Encounter (HOSPITAL_COMMUNITY): Payer: Self-pay | Admitting: *Deleted

## 2014-04-08 ENCOUNTER — Emergency Department (HOSPITAL_COMMUNITY)
Admission: EM | Admit: 2014-04-08 | Discharge: 2014-04-08 | Discharge status: Against Medical Advice | Payer: Medicaid Other | Attending: Emergency Medicine | Admitting: Emergency Medicine

## 2014-04-08 ENCOUNTER — Emergency Department (HOSPITAL_COMMUNITY)
Admission: EM | Admit: 2014-04-08 | Discharge: 2014-04-09 | Disposition: A | Discharge status: Home / Self Care | Payer: Medicaid Other | Attending: Emergency Medicine | Admitting: Emergency Medicine

## 2014-04-08 DIAGNOSIS — J069 Acute upper respiratory infection, unspecified: Secondary | ICD-10-CM

## 2014-04-08 DIAGNOSIS — Z720 Tobacco use: Secondary | ICD-10-CM | POA: Insufficient documentation

## 2014-04-08 DIAGNOSIS — Z79899 Other long term (current) drug therapy: Secondary | ICD-10-CM | POA: Insufficient documentation

## 2014-04-08 DIAGNOSIS — R05 Cough: Secondary | ICD-10-CM | POA: Diagnosis present

## 2014-04-08 DIAGNOSIS — J45909 Unspecified asthma, uncomplicated: Secondary | ICD-10-CM | POA: Diagnosis not present

## 2014-04-08 DIAGNOSIS — Z859 Personal history of malignant neoplasm, unspecified: Secondary | ICD-10-CM | POA: Diagnosis not present

## 2014-04-08 DIAGNOSIS — R45850 Homicidal ideations: Secondary | ICD-10-CM | POA: Insufficient documentation

## 2014-04-08 DIAGNOSIS — Z880 Allergy status to penicillin: Secondary | ICD-10-CM | POA: Diagnosis not present

## 2014-04-08 DIAGNOSIS — Z792 Long term (current) use of antibiotics: Secondary | ICD-10-CM | POA: Diagnosis not present

## 2014-04-08 DIAGNOSIS — Z791 Long term (current) use of non-steroidal anti-inflammatories (NSAID): Secondary | ICD-10-CM | POA: Diagnosis not present

## 2014-04-08 LAB — URINALYSIS, ROUTINE W REFLEX MICROSCOPIC
Bilirubin Urine: NEGATIVE
Glucose, UA: NEGATIVE mg/dL
HGB URINE DIPSTICK: NEGATIVE
KETONES UR: NEGATIVE mg/dL
Leukocytes, UA: NEGATIVE
NITRITE: NEGATIVE
Protein, ur: NEGATIVE mg/dL
SPECIFIC GRAVITY, URINE: 1.023 (ref 1.005–1.030)
Urobilinogen, UA: 1 mg/dL (ref 0.0–1.0)
pH: 7 (ref 5.0–8.0)

## 2014-04-08 LAB — RAPID URINE DRUG SCREEN, HOSP PERFORMED
Amphetamines: NOT DETECTED
Barbiturates: NOT DETECTED
Benzodiazepines: NOT DETECTED
Cocaine: POSITIVE — AB
Opiates: NOT DETECTED
TETRAHYDROCANNABINOL: NOT DETECTED

## 2014-04-08 LAB — RAPID STREP SCREEN (MED CTR MEBANE ONLY): Streptococcus, Group A Screen (Direct): NEGATIVE

## 2014-04-08 MED ORDER — ALBUTEROL SULFATE HFA 108 (90 BASE) MCG/ACT IN AERS: 2.0000 | INHALATION_SPRAY | RESPIRATORY_TRACT | Status: DC | PRN

## 2014-04-08 MED ORDER — ONDANSETRON HCL 4 MG PO TABS: 4.0000 mg | ORAL_TABLET | Freq: Three times a day (TID) | ORAL | Status: DC | PRN

## 2014-04-08 MED ORDER — NICOTINE 21 MG/24HR TD PT24: 21.0000 mg | MEDICATED_PATCH | Freq: Every day | TRANSDERMAL | Status: DC

## 2014-04-08 MED ORDER — ALBUTEROL SULFATE HFA 108 (90 BASE) MCG/ACT IN AERS
2.0000 | INHALATION_SPRAY | Freq: Once | RESPIRATORY_TRACT | Status: AC
  Administered 2014-04-08: 2 via RESPIRATORY_TRACT
  Filled 2014-04-08: qty 6.7

## 2014-04-08 NOTE — ED Notes (Signed)
[employer] at bedside 

## 2014-04-08 NOTE — BHH Counselor (Signed)
Recommended observation overnight with possible am discharge Per Agustina Caroli NP pt does not meet inpatient criteria.   Bedelia Person, M.S., LPCA, [place], [facility] Licensed Professional Counselor Associate  Triage Specialist  [facility]  Therapeutic Triage Services Phone: [phone] Fax: [phone]
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
## 2014-04-08 NOTE — ED Notes (Signed)
PA at bedside.

## 2014-04-08 NOTE — ED Notes (Signed)
ETOH LEVEL D/C'D D/T PT REFUSED MULTIPLE AFTER MUCH ENCOURAGEMENT.

## 2014-04-08 NOTE — ED Notes (Signed)
Patient awake calm cooperative sitter at bedside. Patient denies SI or HI.
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
## 2014-04-08 NOTE — ED Notes (Addendum)
Patient was here and sleeping  Patient was removed from the computer.  Labs, EKG and xray already done.  Flu like symptoms for 2 weeks

## 2014-04-08 NOTE — ED Notes (Signed)
Pt requested Coke and recv the same

## 2014-04-08 NOTE — ED Notes (Signed)
Social Worker at bedside to discuss options for housing assistance appointment tomorow

## 2014-04-08 NOTE — ED Notes (Signed)
PA in process of talking to [employer]

## 2014-04-08 NOTE — ED Notes (Signed)
Christine from TTS called, consult to take place
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
## 2014-04-08 NOTE — ED Notes (Signed)
Breakfast tray ordered 

## 2014-04-09 DIAGNOSIS — R45850 Homicidal ideations: Secondary | ICD-10-CM | POA: Insufficient documentation

## 2014-04-09 DIAGNOSIS — T7621XA Adult sexual abuse, suspected, initial encounter: Secondary | ICD-10-CM

## 2014-04-09 DIAGNOSIS — T7611XA Adult physical abuse, suspected, initial encounter: Secondary | ICD-10-CM

## 2014-04-09 NOTE — ED Notes (Signed)
Pt given [employer] taxi voucher for transport to housing appt and walked to discharge. Pt signed documents stating received all valuables.

## 2014-04-09 NOTE — ED Notes (Signed)
Second attempt to contact Heloise Purpura from [facility] re patients discharge status. Message left.
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
## 2014-04-09 NOTE — ED Provider Notes (Addendum)
Discussed pt with Psych NP - pt has been assessed and plan to d/c home.  Pt not currently a danger to self or others.   Quintella Reichert, MD 04/09/14 [place], MD 04/09/14 [phone]
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
## 2014-04-09 NOTE — Discharge Instructions (Signed)
Upper Respiratory Infection, Adult An upper respiratory infection (URI) is also sometimes known as the common cold. The upper respiratory tract includes the nose, sinuses, throat, trachea, and bronchi. Bronchi are the airways leading to the lungs. Most people improve within 1 week, but symptoms can last up to 2 weeks. A residual cough may last even longer.  CAUSES Many different viruses can infect the tissues lining the upper respiratory tract. The tissues become irritated and inflamed and often become very moist. Mucus production is also common. A cold is contagious. You can easily spread the virus to others by oral contact. This includes kissing, sharing a glass, coughing, or sneezing. Touching your mouth or nose and then touching a surface, which is then touched by another person, can also spread the virus. SYMPTOMS  Symptoms typically develop 1 to 3 days after you come in contact with a cold virus. Symptoms vary from person to person. They may include:  Runny nose.  Sneezing.  Nasal congestion.  Sinus irritation.  Sore throat.  Loss of voice (laryngitis).  Cough.  Fatigue.  Muscle aches.  Loss of appetite.  Headache.  Low-grade fever. DIAGNOSIS  You might diagnose your own cold based on familiar symptoms, since most people get a cold 2 to 3 times a year. Your caregiver can confirm this based on your exam. Most importantly, your caregiver can check that your symptoms are not due to another disease such as strep throat, sinusitis, pneumonia, asthma, or epiglottitis. Blood tests, throat tests, and X-rays are not necessary to diagnose a common cold, but they may sometimes be helpful in excluding other more serious diseases. Your caregiver will decide if any further tests are required. RISKS AND COMPLICATIONS  You may be at risk for a more severe case of the common cold if you smoke cigarettes, have chronic heart disease (such as heart failure) or lung disease (such as asthma), or if  you have a weakened immune system. The very young and very old are also at risk for more serious infections. Bacterial sinusitis, middle ear infections, and bacterial pneumonia can complicate the common cold. The common cold can worsen asthma and chronic obstructive pulmonary disease (COPD). Sometimes, these complications can require emergency medical care and may be life-threatening. PREVENTION  The best way to protect against getting a cold is to practice good hygiene. Avoid oral or hand contact with people with cold symptoms. Wash your hands often if contact occurs. There is no clear evidence that vitamin C, vitamin E, echinacea, or exercise reduces the chance of developing a cold. However, it is always recommended to get plenty of rest and practice good nutrition. TREATMENT  Treatment is directed at relieving symptoms. There is no cure. Antibiotics are not effective, because the infection is caused by a virus, not by bacteria. Treatment may include:  Increased fluid intake. Sports drinks offer valuable electrolytes, sugars, and fluids.  Breathing heated mist or steam (vaporizer or shower).  Eating chicken soup or other clear broths, and maintaining good nutrition.  Getting plenty of rest.  Using gargles or lozenges for comfort.  Controlling fevers with ibuprofen or acetaminophen as directed by your caregiver.  Increasing usage of your inhaler if you have asthma. Zinc gel and zinc lozenges, taken in the first 24 hours of the common cold, can shorten the duration and lessen the severity of symptoms. Pain medicines may help with fever, muscle aches, and throat pain. A variety of non-prescription medicines are available to treat congestion and runny nose. Your caregiver   can make recommendations and may suggest nasal or lung inhalers for other symptoms.  HOME CARE INSTRUCTIONS   Only take over-the-counter or prescription medicines for pain, discomfort, or fever as directed by your  caregiver.  Use a warm mist humidifier or inhale steam from a shower to increase air moisture. This may keep secretions moist and make it easier to breathe.  Drink enough water and fluids to keep your urine clear or pale yellow.  Rest as needed.  Return to work when your temperature has returned to normal or as your caregiver advises. You may need to stay home longer to avoid infecting others. You can also use a face mask and careful hand washing to prevent spread of the virus. SEEK MEDICAL CARE IF:   After the first few days, you feel you are getting worse rather than better.  You need your caregiver's advice about medicines to control symptoms.  You develop chills, worsening shortness of breath, or brown or red sputum. These may be signs of pneumonia.  You develop yellow or brown nasal discharge or pain in the face, especially when you bend forward. These may be signs of sinusitis.  You develop a fever, swollen neck glands, pain with swallowing, or white areas in the back of your throat. These may be signs of strep throat. SEEK IMMEDIATE MEDICAL CARE IF:   You have a fever.  You develop severe or persistent headache, ear pain, sinus pain, or chest pain.  You develop wheezing, a prolonged cough, cough up blood, or have a change in your usual mucus (if you have chronic lung disease).  You develop sore muscles or a stiff neck. Document Released: 07/22/2000 Document Revised: 04/20/2011 Document Reviewed: 05/03/2013 [employer] Patient Information 2015 [employer]. This information is not intended to replace advice given to you by your health care provider. Make sure you discuss any questions you have with your health care provider.  

## 2014-04-09 NOTE — ED Notes (Signed)
[facility] states they will review telepsych with patient at 0830

## 2014-04-10 LAB — CULTURE, GROUP A STREP: Strep A Culture: NEGATIVE

## 2014-05-09 ENCOUNTER — Encounter (HOSPITAL_COMMUNITY): Payer: Self-pay | Admitting: Emergency Medicine

## 2014-05-09 ENCOUNTER — Emergency Department (HOSPITAL_COMMUNITY)
Admission: EM | Admit: 2014-05-09 | Discharge: 2014-05-09 | Disposition: A | Discharge status: Home / Self Care | Payer: Medicaid Other | Attending: Emergency Medicine | Admitting: Emergency Medicine

## 2014-05-09 DIAGNOSIS — Z720 Tobacco use: Secondary | ICD-10-CM | POA: Insufficient documentation

## 2014-05-09 DIAGNOSIS — Z880 Allergy status to penicillin: Secondary | ICD-10-CM | POA: Diagnosis not present

## 2014-05-09 DIAGNOSIS — M791 Myalgia: Secondary | ICD-10-CM | POA: Insufficient documentation

## 2014-05-09 DIAGNOSIS — R51 Headache: Secondary | ICD-10-CM | POA: Diagnosis not present

## 2014-05-09 DIAGNOSIS — Z8589 Personal history of malignant neoplasm of other organs and systems: Secondary | ICD-10-CM | POA: Diagnosis not present

## 2014-05-09 DIAGNOSIS — F419 Anxiety disorder, unspecified: Secondary | ICD-10-CM | POA: Diagnosis not present

## 2014-05-09 DIAGNOSIS — G8929 Other chronic pain: Secondary | ICD-10-CM

## 2014-05-09 MED ORDER — DIPHENHYDRAMINE HCL 25 MG PO CAPS
25.0000 mg | ORAL_CAPSULE | Freq: Once | ORAL | Status: AC
  Administered 2014-05-09: 25 mg via ORAL
  Filled 2014-05-09: qty 1

## 2014-05-09 MED ORDER — METOCLOPRAMIDE HCL 10 MG PO TABS
10.0000 mg | ORAL_TABLET | Freq: Once | ORAL | Status: AC
  Administered 2014-05-09: 10 mg via ORAL
  Filled 2014-05-09: qty 1

## 2014-05-09 MED ORDER — OXYCODONE HCL 5 MG PO TABS
5.0000 mg | ORAL_TABLET | Freq: Once | ORAL | Status: AC
  Administered 2014-05-09: 5 mg via ORAL
  Filled 2014-05-09: qty 1

## 2014-05-09 NOTE — ED Provider Notes (Signed)
CSN: [id number]     Arrival date & time 05/09/14  0020 History   First MD Initiated Contact with Patient 05/09/14 [phone]     Chief Complaint  Patient presents with  . Migraine  . Generalized Body Aches     (Consider location/radiation/quality/duration/timing/severity/associated sxs/prior Treatment) Patient is a 49 y.o. female presenting with headaches. The history is provided by the patient. No language interpreter was used.  Headache Pain location:  Generalized Quality:  Dull Radiates to:  Does not radiate Severity currently:  7/10 Severity at highest:  10/10 Onset quality:  Sudden Duration: years' since 2008 or 2009. Timing: Daily. Progression:  Waxing and waning Chronicity:  Chronic Similar to prior headaches: yes   Relieved by:  Nothing Worsened by:  Nothing Ineffective treatments:  None tried Associated symptoms: myalgias   Associated symptoms: no abdominal pain, no back pain, no congestion, no cough, no diarrhea, no fatigue, no fever, no nausea, no neck pain, no neck stiffness, no numbness, no photophobia, no sore throat, no vomiting and no weakness   Associated symptoms comment:  Today is also complaining of generalized body aches.  She reports increased psychosocial stressors, poor sleep.   Past Medical History  Diagnosis Date  . Cancer   . Asthma   . Anxiety    Past Surgical History  Procedure Laterality Date  . Hernia repair    . Cesarean section     History reviewed. No pertinent family history. History  Substance Use Topics  . Smoking status: Current Every Day Smoker    Types: Cigarettes  . Smokeless tobacco: Not on file  . Alcohol Use: 3.6 oz/week    6 Cans of beer per week   OB History    No data available     Review of Systems  Constitutional: Negative for fever, chills, diaphoresis, activity change, appetite change and fatigue.  HENT: Negative for congestion, facial swelling, rhinorrhea and sore throat.   Eyes: Negative for photophobia and  discharge.  Respiratory: Negative for cough, chest tightness and shortness of breath.   Cardiovascular: Negative for chest pain, palpitations and leg swelling.  Gastrointestinal: Negative for nausea, vomiting, abdominal pain and diarrhea.  Endocrine: Negative for polydipsia and polyuria.  Genitourinary: Negative for dysuria, frequency, difficulty urinating and pelvic pain.  Musculoskeletal: Positive for myalgias. Negative for back pain, arthralgias, neck pain and neck stiffness.  Skin: Negative for color change and wound.  Allergic/Immunologic: Negative for immunocompromised state.  Neurological: Positive for headaches. Negative for facial asymmetry, weakness and numbness.  Hematological: Does not bruise/bleed easily.  Psychiatric/Behavioral: Negative for confusion and agitation.      Allergies  Prednisone; Bicillin c-r; Ibuprofen; Nsaids; Penicillins; and Tylenol  Home Medications   Prior to Admission medications   Medication Sig Start Date End Date Taking? Authorizing Provider  azithromycin (ZITHROMAX) 250 MG tablet Take 1 tablet (250 mg total) by mouth daily. Take first 2 tablets together, then 1 every day until finished. Patient not taking: Reported on 04/08/2014 03/11/13   Ignacia Felling, PA-C  meloxicam (MOBIC) 15 MG tablet Take 1 tablet (15 mg total) by mouth daily. Patient not taking: Reported on 04/08/2014 10/09/13   Clayton Bibles, PA-C  methocarbamol (ROBAXIN) 500 MG tablet Take 1 tablet (500 mg total) by mouth 2 (two) times daily. Patient not taking: Reported on 04/08/2014 03/11/13   Ignacia Felling, PA-C  traMADol (ULTRAM) 50 MG tablet Take 1 tablet (50 mg total) by mouth every 6 (six) hours as needed. Patient not taking: Reported on 05/09/2014 11/08/13  Domenic Moras, PA-C   BP 130/91 mmHg  Pulse 98  Temp(Src) 97.9 F (36.6 C) (Oral)  Resp 18  SpO2 100%  LMP 05/06/2014 (Exact Date) Physical Exam  Constitutional: She is oriented to person, place, and time. She appears  well-developed and well-nourished. No distress.  HENT:  Head: Normocephalic and atraumatic.  Mouth/Throat: No oropharyngeal exudate.  Eyes: Pupils are equal, round, and reactive to light.  Neck: Normal range of motion. Neck supple.  Cardiovascular: Normal rate, regular rhythm and normal heart sounds.  Exam reveals no gallop and no friction rub.   No murmur heard. Pulmonary/Chest: Effort normal and breath sounds normal. No respiratory distress. She has no wheezes. She has no rales.  Abdominal: Soft. Bowel sounds are normal. She exhibits no distension and no mass. There is no tenderness. There is no rebound and no guarding.  Musculoskeletal: Normal range of motion. She exhibits no edema or tenderness.  Neurological: She is alert and oriented to person, place, and time. She has normal strength. She displays no atrophy and no tremor. No cranial nerve deficit or sensory deficit. She exhibits normal muscle tone. She displays a negative Romberg sign. She displays no seizure activity. Coordination and gait normal. GCS eye subscore is 4. GCS verbal subscore is 5. GCS motor subscore is 6.  Skin: Skin is warm and dry.  Psychiatric: She has a normal mood and affect.    ED Course  Procedures (including critical care time) Labs Review Labs Reviewed - No data to display  Imaging Review No results found.   EKG Interpretation None      MDM   Final diagnoses:  Chronic nonintractable headache, unspecified headache type    Pt is a 49 y.o. female with Pmhx as above who presents with daily chronic headaches since 2008 or 9.  She states nothing makes them better or worse.  She does not have associated symptoms.  She also reports current pain all over.  She admits to drinking alcohol and smoking marijuana when her pain gets bad, she seen a neurologist in the past been on for some time.  She reports having history of CTs, which is been negative and interpreted review, I do see a negative CT from 2013.  On  physical exam vitals are stable.  Patient is in no acute distress.  She's no focal neuro findings, though does report having intermittent numbness and tingling in her feet and hands.  Symptoms are not consistent with CVA, TIA, subarachnoid hemorrhage or meningitis.  I do not feel that she needs repeat neuro imaging.  She felt somewhat improved after a by mouth migraine cocktail.  We have asked her to reestablish care with neurology for further symptom Control.      Sallee Provencal evaluation in the Emergency Department is complete. It has been determined that no acute conditions requiring further emergency intervention are present at this time. The patient/guardian have been advised of the diagnosis and plan. We have discussed signs and symptoms that warrant return to the ED, such as changes or worsening in symptoms, fevers, numbness, weakness, confusion      Ernestina Patches, MD 05/09/14 [phone]

## 2014-05-09 NOTE — Discharge Instructions (Signed)

## 2014-05-09 NOTE — ED Notes (Signed)
Pt states she has been having headaches for the past few months but they have been worse lately  Pt states she has pain that goes down the back of her head, neck, shoulders, and back   Pt states she is going through a lot of mental stress right now  Pt states she has arthritis and is aching all over  Pt states she is a cancer survivor as well

## 2014-05-15 ENCOUNTER — Ambulatory Visit: Payer: Self-pay | Admitting: Neurology

## 2014-05-16 ENCOUNTER — Encounter: Payer: Self-pay | Admitting: Neurology

## 2014-10-30 ENCOUNTER — Encounter (HOSPITAL_COMMUNITY): Payer: Self-pay

## 2014-10-30 ENCOUNTER — Emergency Department (HOSPITAL_COMMUNITY): Payer: Medicaid Other

## 2014-10-30 ENCOUNTER — Observation Stay (HOSPITAL_COMMUNITY)
Admission: EM | Admit: 2014-10-30 | Discharge: 2014-11-01 | Disposition: A | Discharge status: Home / Self Care | Payer: Medicaid Other | Attending: Internal Medicine | Admitting: Internal Medicine

## 2014-10-30 DIAGNOSIS — F1910 Other psychoactive substance abuse, uncomplicated: Secondary | ICD-10-CM | POA: Insufficient documentation

## 2014-10-30 DIAGNOSIS — F1010 Alcohol abuse, uncomplicated: Secondary | ICD-10-CM | POA: Diagnosis not present

## 2014-10-30 DIAGNOSIS — IMO0001 Reserved for inherently not codable concepts without codable children: Secondary | ICD-10-CM

## 2014-10-30 DIAGNOSIS — I214 Non-ST elevation (NSTEMI) myocardial infarction: Secondary | ICD-10-CM | POA: Diagnosis not present

## 2014-10-30 DIAGNOSIS — Z6841 Body Mass Index (BMI) 40.0 and over, adult: Secondary | ICD-10-CM | POA: Diagnosis not present

## 2014-10-30 DIAGNOSIS — D649 Anemia, unspecified: Secondary | ICD-10-CM | POA: Insufficient documentation

## 2014-10-30 DIAGNOSIS — R7989 Other specified abnormal findings of blood chemistry: Secondary | ICD-10-CM | POA: Insufficient documentation

## 2014-10-30 DIAGNOSIS — F17210 Nicotine dependence, cigarettes, uncomplicated: Secondary | ICD-10-CM | POA: Insufficient documentation

## 2014-10-30 DIAGNOSIS — R0789 Other chest pain: Secondary | ICD-10-CM | POA: Diagnosis present

## 2014-10-30 DIAGNOSIS — R072 Precordial pain: Secondary | ICD-10-CM

## 2014-10-30 DIAGNOSIS — F17200 Nicotine dependence, unspecified, uncomplicated: Secondary | ICD-10-CM

## 2014-10-30 DIAGNOSIS — Z85118 Personal history of other malignant neoplasm of bronchus and lung: Secondary | ICD-10-CM | POA: Diagnosis not present

## 2014-10-30 DIAGNOSIS — K219 Gastro-esophageal reflux disease without esophagitis: Secondary | ICD-10-CM | POA: Insufficient documentation

## 2014-10-30 DIAGNOSIS — F411 Generalized anxiety disorder: Secondary | ICD-10-CM | POA: Insufficient documentation

## 2014-10-30 DIAGNOSIS — Z720 Tobacco use: Secondary | ICD-10-CM | POA: Diagnosis not present

## 2014-10-30 DIAGNOSIS — F419 Anxiety disorder, unspecified: Secondary | ICD-10-CM | POA: Diagnosis not present

## 2014-10-30 DIAGNOSIS — R079 Chest pain, unspecified: Secondary | ICD-10-CM | POA: Diagnosis present

## 2014-10-30 DIAGNOSIS — F1410 Cocaine abuse, uncomplicated: Secondary | ICD-10-CM | POA: Insufficient documentation

## 2014-10-30 DIAGNOSIS — J45909 Unspecified asthma, uncomplicated: Secondary | ICD-10-CM | POA: Insufficient documentation

## 2014-10-30 DIAGNOSIS — R778 Other specified abnormalities of plasma proteins: Secondary | ICD-10-CM | POA: Insufficient documentation

## 2014-10-30 DIAGNOSIS — I2510 Atherosclerotic heart disease of native coronary artery without angina pectoris: Secondary | ICD-10-CM | POA: Diagnosis not present

## 2014-10-30 DIAGNOSIS — R0602 Shortness of breath: Secondary | ICD-10-CM

## 2014-10-30 LAB — TROPONIN I
TROPONIN I: 0.09 ng/mL — AB (ref <0.031)
TROPONIN I: 0.1 ng/mL — AB (ref <0.031)
TROPONIN I: 0.07 ng/mL — AB (ref <0.031)

## 2014-10-30 LAB — BASIC METABOLIC PANEL
Anion gap: 7 (ref 5–15)
BUN: 9 mg/dL (ref 6–20)
CHLORIDE: 105 mmol/L (ref 101–111)
CO2: 25 mmol/L (ref 22–32)
CREATININE: 0.8 mg/dL (ref 0.44–1.00)
Calcium: 9.1 mg/dL (ref 8.9–10.3)
GFR calc Af Amer: >60 mL/min (ref >60)
GFR calc non Af Amer: >60 mL/min (ref >60)
Glucose, Bld: 107 mg/dL — ABNORMAL HIGH (ref 65–99)
Potassium: 4 mmol/L (ref 3.5–5.1)
SODIUM: 137 mmol/L (ref 135–145)

## 2014-10-30 LAB — CBC
HCT: 34.7 % — ABNORMAL LOW (ref 36.0–46.0)
Hemoglobin: 11.3 g/dL — ABNORMAL LOW (ref 12.0–15.0)
MCH: 29.1 pg (ref 26.0–34.0)
MCHC: 32.6 g/dL (ref 30.0–36.0)
MCV: 89.4 fL (ref 78.0–100.0)
PLATELETS: 229 10*3/uL (ref 150–400)
RBC: 3.88 MIL/uL (ref 3.87–5.11)
RDW: 14.2 % (ref 11.5–15.5)
WBC: 4.2 10*3/uL (ref 4.0–10.5)

## 2014-10-30 LAB — RAPID URINE DRUG SCREEN, HOSP PERFORMED
AMPHETAMINES: NOT DETECTED
Barbiturates: NOT DETECTED
Benzodiazepines: NOT DETECTED
Cocaine: POSITIVE — AB
OPIATES: POSITIVE — AB
Tetrahydrocannabinol: NOT DETECTED

## 2014-10-30 LAB — I-STAT TROPONIN, ED: Troponin i, poc: 0.1 ng/mL (ref 0.00–0.08)

## 2014-10-30 LAB — TSH: TSH: 3.304 u[IU]/mL (ref 0.350–4.500)

## 2014-10-30 LAB — D-DIMER, QUANTITATIVE: D-Dimer, Quant: 1.02 ug/mL-FEU — ABNORMAL HIGH (ref 0.00–0.48)

## 2014-10-30 LAB — BRAIN NATRIURETIC PEPTIDE: B Natriuretic Peptide: 35.4 pg/mL (ref 0.0–100.0)

## 2014-10-30 MED ORDER — DOCUSATE SODIUM 100 MG PO CAPS
100.0000 mg | ORAL_CAPSULE | Freq: Two times a day (BID) | ORAL | Status: DC
  Administered 2014-10-30 – 2014-11-01 (×4): 100 mg via ORAL
  Filled 2014-10-30 (×4): qty 1

## 2014-10-30 MED ORDER — PANTOPRAZOLE SODIUM 40 MG IV SOLR
40.0000 mg | Freq: Two times a day (BID) | INTRAVENOUS | Status: DC
  Administered 2014-10-30 – 2014-10-31 (×2): 40 mg via INTRAVENOUS
  Filled 2014-10-30 (×2): qty 40

## 2014-10-30 MED ORDER — IOHEXOL 350 MG/ML SOLN
100.0000 mL | Freq: Once | INTRAVENOUS | Status: AC | PRN
  Administered 2014-10-30: 100 mL via INTRAVENOUS

## 2014-10-30 MED ORDER — ALBUTEROL SULFATE (2.5 MG/3ML) 0.083% IN NEBU: 2.5000 mg | INHALATION_SOLUTION | RESPIRATORY_TRACT | Status: DC | PRN

## 2014-10-30 MED ORDER — SODIUM CHLORIDE 0.9 % IV SOLN
INTRAVENOUS | Status: DC
  Administered 2014-10-30: 75 mL via INTRAVENOUS
  Administered 2014-10-31: 05:00:00 via INTRAVENOUS

## 2014-10-30 MED ORDER — ONDANSETRON HCL 4 MG PO TABS: 4.0000 mg | ORAL_TABLET | Freq: Four times a day (QID) | ORAL | Status: DC | PRN

## 2014-10-30 MED ORDER — NITROGLYCERIN 0.4 MG SL SUBL: 0.4000 mg | SUBLINGUAL_TABLET | SUBLINGUAL | Status: DC | PRN

## 2014-10-30 MED ORDER — SODIUM CHLORIDE 0.9 % IJ SOLN
3.0000 mL | Freq: Two times a day (BID) | INTRAMUSCULAR | Status: DC
  Administered 2014-10-31: 3 mL via INTRAVENOUS

## 2014-10-30 MED ORDER — ONDANSETRON HCL 4 MG/2ML IJ SOLN: 4.0000 mg | Freq: Four times a day (QID) | INTRAMUSCULAR | Status: DC | PRN

## 2014-10-30 MED ORDER — IPRATROPIUM-ALBUTEROL 0.5-2.5 (3) MG/3ML IN SOLN
3.0000 mL | Freq: Once | RESPIRATORY_TRACT | Status: AC
  Administered 2014-10-30: 3 mL via RESPIRATORY_TRACT
  Filled 2014-10-30: qty 3

## 2014-10-30 MED ORDER — MORPHINE SULFATE (PF) 4 MG/ML IV SOLN
4.0000 mg | Freq: Once | INTRAVENOUS | Status: AC
  Administered 2014-10-30: 4 mg via INTRAVENOUS
  Filled 2014-10-30: qty 1

## 2014-10-30 MED ORDER — ALUM & MAG HYDROXIDE-SIMETH 200-200-20 MG/5ML PO SUSP: 30.0000 mL | Freq: Four times a day (QID) | ORAL | Status: DC | PRN

## 2014-10-30 MED ORDER — ENOXAPARIN SODIUM 40 MG/0.4ML ~~LOC~~ SOLN
40.0000 mg | SUBCUTANEOUS | Status: DC
  Filled 2014-10-30 (×2): qty 0.4

## 2014-10-30 MED ORDER — ASPIRIN 81 MG PO CHEW
324.0000 mg | CHEWABLE_TABLET | Freq: Once | ORAL | Status: AC
  Administered 2014-10-30: 324 mg via ORAL
  Filled 2014-10-30: qty 4

## 2014-10-30 MED ORDER — MORPHINE SULFATE (PF) 2 MG/ML IV SOLN: 1.0000 mg | INTRAVENOUS | Status: DC | PRN

## 2014-10-30 MED ORDER — SODIUM CHLORIDE 0.9 % IV BOLUS (SEPSIS)
1000.0000 mL | Freq: Once | INTRAVENOUS | Status: AC
  Administered 2014-10-30: 1000 mL via INTRAVENOUS

## 2014-10-30 MED ORDER — FAMOTIDINE IN NACL 20-0.9 MG/50ML-% IV SOLN
20.0000 mg | Freq: Once | INTRAVENOUS | Status: AC
  Administered 2014-10-30: 20 mg via INTRAVENOUS
  Filled 2014-10-30: qty 50

## 2014-10-30 NOTE — Progress Notes (Signed)
Patient refused Lovenox injection. Education provided regarding benefits/goals of medication. M.D. Paged regarding pt.'s refusal

## 2014-10-30 NOTE — ED Provider Notes (Signed)
CSN: [id number]     Arrival date & time 10/30/14  1950 History   First MD Initiated Contact with Patient 10/30/14 [phone]     Chief Complaint  Patient presents with  . Chest Pain  . Shortness of Breath     (Consider location/radiation/quality/duration/timing/severity/associated sxs/prior Treatment) HPI   Blood pressure 135/94, pulse 105, temperature 98.3 F (36.8 C), temperature source Oral, resp. rate 19, last menstrual period 10/29/2014, SpO2 100 %.  Andrea Mullen is a 49 y.o. female  with history of lung cancer in remission x9 years ago, asthma, and panic attacks presenting to ED with chest pain and shortness of breath. Chest pain started approximately one week, initially attributed to allergies and took allergy medication; however, no relief. Pain is is predominantly right sided, worse with deep inspiration, with occasional radiation to neck and arm. Complains of cough that is intermittently productive with yellow/green sputum. Reports quitting smoking one week ago (~10.5 pack year history). Endorses DOE and wheezing. Palpitations occasionally. Positive 20lb weight gain in last month. She ambulates without the need for assistive devices. Denies anticoagulant therapy or recent surgery, history of DVT or PE, calf pain or leg swelling. Father positive history of MI in 50-60s.   Past Medical History  Diagnosis Date  . Cancer   . Asthma   . Anxiety    Past Surgical History  Procedure Laterality Date  . Hernia repair    . Cesarean section     History reviewed. No pertinent family history. Social History  Substance Use Topics  . Smoking status: Current Every Day Smoker    Types: Cigarettes  . Smokeless tobacco: None  . Alcohol Use: 3.6 oz/week    6 Cans of beer per week   OB History    No data available     Review of Systems  10 systems reviewed and found to be negative, except as noted in the HPI.  Allergies  Prednisone; Bicillin c-r; Ibuprofen; Nsaids; Penicillins; and  Tylenol  Home Medications   Prior to Admission medications   Medication Sig Start Date End Date Taking? Authorizing Provider  PRESCRIPTION MEDICATION Take 1 tablet by mouth daily as needed (for sinus's). *Sinus Pill* - pharmacy has never filled a sinus pill for patient. Unable to verify name of medication   Yes Historical Provider, MD   BP 119/73 mmHg  Pulse 81  Temp(Src) 98.3 F (36.8 C) (Oral)  Resp 20  SpO2 100%  LMP 10/29/2014 Physical Exam  Constitutional: She is oriented to person, place, and time. She appears well-developed and well-nourished. No distress.  Obese  HENT:  Head: Normocephalic.  Mouth/Throat: Oropharynx is clear and moist.  Eyes: Conjunctivae and EOM are normal.  Neck: Normal range of motion. No JVD present. No tracheal deviation present.  Cardiovascular: Normal rate, regular rhythm and intact distal pulses.   Radial pulse equal bilaterally  Pulmonary/Chest: Effort normal and breath sounds normal. No stridor. No respiratory distress. She has no wheezes. She has no rales. She exhibits tenderness.  Reproducible right chest wall pain at sternal border with palpation.  Abdominal: Soft. She exhibits no distension and no mass. There is no tenderness. There is no rebound and no guarding.  Musculoskeletal: Normal range of motion. She exhibits no edema or tenderness.  No calf asymmetry, superficial collaterals, palpable cords, edema, Homans sign negative bilaterally.    Neurological: She is alert and oriented to person, place, and time.  Skin: Skin is warm. She is not diaphoretic.  Psychiatric: She has a  normal mood and affect.  Nursing note and vitals reviewed.   ED Course  Procedures (including critical care time) Labs Review Labs Reviewed  BASIC METABOLIC PANEL - Abnormal; Notable for the following:    Glucose, Bld 107 (*)    All other components within normal limits  CBC - Abnormal; Notable for the following:    Hemoglobin 11.3 (*)    HCT 34.7 (*)    All  other components within normal limits  D-DIMER, QUANTITATIVE (NOT AT [facility]) - Abnormal; Notable for the following:    D-Dimer, Quant 1.02 (*)    All other components within normal limits  TROPONIN I - Abnormal; Notable for the following:    Troponin I 0.09 (*)    All other components within normal limits  I-STAT TROPOININ, ED - Abnormal; Notable for the following:    Troponin i, poc 0.10 (*)    All other components within normal limits  BRAIN NATRIURETIC PEPTIDE    Imaging Review Dg Chest 2 View  10/30/2014   CLINICAL DATA:  Chest pain, nausea and shortness of Breath for 1 week.  EXAM: CHEST  2 VIEW  COMPARISON:  04/07/2014  FINDINGS: The heart size and mediastinal contours are within normal limits. Both lungs are clear. The visualized skeletal structures are unremarkable.  IMPRESSION: No active cardiopulmonary disease.   Electronically Signed   By: Rolm Baptise M.D.   On: 10/30/2014 10:14   Ct Angio Chest Pe W/cm &/or Wo Cm  10/30/2014   CLINICAL DATA:  Chest pain, shortness of breath.  EXAM: CT ANGIOGRAPHY CHEST WITH CONTRAST  TECHNIQUE: Multidetector CT imaging of the chest was performed using the standard protocol during bolus administration of intravenous contrast. Multiplanar CT image reconstructions and MIPs were obtained to evaluate the vascular anatomy.  CONTRAST:  113m OMNIPAQUE IOHEXOL 350 MG/ML SOLN  COMPARISON:  CT scan of July 15, 2007.  FINDINGS: No pneumothorax or pleural effusion is noted. Scarring is noted anteriorly in the right upper lobe. No acute pulmonary disease is noted. There is no evidence thoracic aortic dissection or aneurysm. There is no evidence of pulmonary embolus. No mediastinal mass or adenopathy is noted. Visualized portion of upper abdomen is unremarkable no significant osseous abnormality is noted.  Review of the MIP images confirms the above findings.  IMPRESSION: No evidence of pulmonary embolus. No acute cardiopulmonary abnormality seen.   Electronically  Signed   By: JMarijo Conception M.D.   On: 10/30/2014 13:12   I have personally reviewed and evaluated these images and lab results as part of my medical decision-making.   EKG Interpretation   Date/Time:  Tuesday October 30 2014 09:36:08 EDT Ventricular Rate:  106 PR Interval:  129 QRS Duration: 74 QT Interval:  337 QTC Calculation: 447 R Axis:   19 Text Interpretation:  Sinus tachycardia Probable left atrial enlargement  Low voltage, precordial leads rate increased compared to prior. Confirmed  by [facility] MD, TREY (([id number] on 10/30/2014 11:24:53 AM      MDM   Final diagnoses:  Chest pain, unspecified chest pain type  SOB (shortness of breath)  Elevated troponin    Filed Vitals:   10/30/14 0938 10/30/14 1217  BP: 135/94 119/73  Pulse: 105 81  Temp: 98.3 F (36.8 C)   TempSrc: Oral   Resp: 19 20  SpO2: 100% 100%    Medications  sodium chloride 0.9 % bolus 1,000 mL (1,000 mLs Intravenous New Bag/Given 10/30/14 1102)  aspirin chewable tablet 324 mg (324  mg Oral Given 10/30/14 1102)  famotidine (PEPCID) IVPB 20 mg premix (0 mg Intravenous Stopped 10/30/14 1132)  ipratropium-albuterol (DUONEB) 0.5-2.5 (3) MG/3ML nebulizer solution 3 mL (3 mLs Nebulization Given 10/30/14 1110)  morphine 4 MG/ML injection 4 mg (4 mg Intravenous Given 10/30/14 1102)  iohexol (OMNIPAQUE) 350 MG/ML injection 100 mL (100 mLs Intravenous Contrast Given 10/30/14 1232)    Andrea Mullen is a pleasant 49 y.o. female presenting with right-sided pleuritic chest pain. Patient is mildly tachycardic, EKG with no ischemia or arrhythmia. Chest x-ray normal. Patient given full dose aspirin and Vicodin for likely musculoskeletal chest pain. This is a right-sided pain that is reproducible to palpation. Patient pending troponin, dimer, proBNP. Patient has no cardiac history, she is low risk by heart score.  Troponin is mildly elevated at 0.1. Will check a lab troponin as this was an i-STAT. Her dimers are also  elevated.   Patient reports complete resolution of chest pain after morphine. CT angiogram is negative for PE or other intrathoracic abnormality.  Case discussed with cardiology, I think this patient can be seen in the morning, obviously troponins are trending up they will need to be called in to evaluate sooner.  Case discussed with [facility] hospitalist Dr. Tyrell Antonio who is uncomfortable admitting this patient without cardiology consult ASAP. Discussed with cardiology who will come to evaluate this patient soon as possible.    Monico Blitz, PA-C 10/30/14 [place], PA-C 10/30/14 [place], MD 10/31/14 2109

## 2014-10-30 NOTE — ED Notes (Signed)
Pt c/o chest pain and nausea "forever" and SOB x 1 week.  Pain score 7/10.  Pt reports she has been taking OTC cold medication w/o relief.  Also, reports she has been under "alot" of stress.

## 2014-10-30 NOTE — ED Notes (Signed)
Patient transported to CT 

## 2014-10-30 NOTE — H&P (Signed)
[facility] History and Physical  Andrea Mullen NOB:[id number] DOB: [birth year] DOA: 10/30/2014  Referring physician: PA, [place] PCP: Pcp Not In System   Chief Complaint: Chest pain.   HPI: Andrea Mullen is a 49 y.o. female with PMH significant for Lung cancer 2008 , remission, Asthma, Current smoker, Obese who presents complaining  of chest pain, middle chest, sharp in quality, Intermittent. Happen at rest when she is " thinking " . She denies chest pain on exertion, does relates dyspnea on exertion. Denies drug use currently. She has been feeling sick, not feeling well. She is chest pain free after morphine.   Evaluation in the ED; EKG sinu tachycardia, D dimer elevated at 1, CT angio negative for PE, Troponin 0.10.   Review of Systems:  Negative, except as per HPI/   Past Medical History  Diagnosis Date  . Cancer   . Asthma   . Anxiety    Past Surgical History  Procedure Laterality Date  . Hernia repair    . Cesarean section     Social History:  reports that she has been smoking Cigarettes.  She does not have any smokeless tobacco history on file. She reports that she drinks about 3.6 oz of alcohol per week. She reports that she does not use illicit drugs.  Allergies  Allergen Reactions  . Prednisone Anaphylaxis    seizure  . Bicillin C-R Hives  . Ibuprofen Nausea Only  . Nsaids Other (See Comments)    Hives   . Penicillins Hives    Has patient had a PCN reaction causing immediate rash, facial/tongue/throat swelling, SOB or lightheadedness with hypotension: No Has patient had a PCN reaction causing severe rash involving mucus membranes or skin necrosis: Yes Has patient had a PCN reaction that required hospitalization No Has patient had a PCN reaction occurring within the last 10 years: Yes  If all of the above answers are "NO", then may proceed with Cephalosporin use.   . Tylenol [Acetaminophen] Nausea Only   Family History: Father HTN, Diabetes.    Prior to Admission medications   Medication Sig Start Date End Date Taking? Authorizing Provider  PRESCRIPTION MEDICATION Take 1 tablet by mouth daily as needed (for sinus's). *Sinus Pill* - pharmacy has never filled a sinus pill for patient. Unable to verify name of medication   Yes Historical Provider, MD   Physical Exam: Filed Vitals:   10/30/14 0938 10/30/14 1217  BP: 135/94 119/73  Pulse: 105 81  Temp: 98.3 F (36.8 C)   TempSrc: Oral   Resp: 19 20  SpO2: 100% 100%    Wt Readings from Last 3 Encounters:  04/08/14 105.235 kg (232 lb)  10/09/13 101.606 kg (224 lb)  03/11/13 107.82 kg (237 lb 11.2 oz)    General:  Appears calm and comfortable Eyes: PERRL, normal lids, irises & conjunctiva ENT: grossly normal hearing, lips & tongue Neck: no LAD, masses or thyromegaly Cardiovascular: RRR, no m/r/g. No LE edema. Telemetry: SR, no arrhythmias  Respiratory: CTA bilaterally, no w/r/r. Normal respiratory effort. Abdomen: soft, ntnd Skin: no rash or induration seen on limited exam Musculoskeletal: grossly normal tone BUE/BLE Psychiatric: grossly normal mood and affect, speech fluent and appropriate Neurologic: grossly non-focal.          Labs on Admission:  Basic Metabolic Panel:  Recent Labs Lab 10/30/14 1047  NA 137  K 4.0  CL 105  CO2 25  GLUCOSE 107*  BUN 9  CREATININE 0.80  CALCIUM 9.1  Liver Function Tests: No results for input(s): AST, ALT, ALKPHOS, BILITOT, PROT, ALBUMIN in the last 168 hours. No results for input(s): LIPASE, AMYLASE in the last 168 hours. No results for input(s): AMMONIA in the last 168 hours. CBC:  Recent Labs Lab 10/30/14 1047  WBC 4.2  HGB 11.3*  HCT 34.7*  MCV 89.4  PLT 229   Cardiac Enzymes:  Recent Labs Lab 10/30/14 1047  TROPONINI 0.09*    BNP (last 3 results)  Recent Labs  10/30/14 1047  BNP 35.4    ProBNP (last 3 results) No results for input(s): PROBNP in the last 8760 hours.  CBG: No results for  input(s): GLUCAP in the last 168 hours.  Radiological Exams on Admission: Dg Chest 2 View  10/30/2014   CLINICAL DATA:  Chest pain, nausea and shortness of Breath for 1 week.  EXAM: CHEST  2 VIEW  COMPARISON:  04/07/2014  FINDINGS: The heart size and mediastinal contours are within normal limits. Both lungs are clear. The visualized skeletal structures are unremarkable.  IMPRESSION: No active cardiopulmonary disease.   Electronically Signed   By: Rolm Baptise M.D.   On: 10/30/2014 10:14   Ct Angio Chest Pe W/cm &/or Wo Cm  10/30/2014   CLINICAL DATA:  Chest pain, shortness of breath.  EXAM: CT ANGIOGRAPHY CHEST WITH CONTRAST  TECHNIQUE: Multidetector CT imaging of the chest was performed using the standard protocol during bolus administration of intravenous contrast. Multiplanar CT image reconstructions and MIPs were obtained to evaluate the vascular anatomy.  CONTRAST:  166m OMNIPAQUE IOHEXOL 350 MG/ML SOLN  COMPARISON:  CT scan of July 15, 2007.  FINDINGS: No pneumothorax or pleural effusion is noted. Scarring is noted anteriorly in the right upper lobe. No acute pulmonary disease is noted. There is no evidence thoracic aortic dissection or aneurysm. There is no evidence of pulmonary embolus. No mediastinal mass or adenopathy is noted. Visualized portion of upper abdomen is unremarkable no significant osseous abnormality is noted.  Review of the MIP images confirms the above findings.  IMPRESSION: No evidence of pulmonary embolus. No acute cardiopulmonary abnormality seen.   Electronically Signed   By: JMarijo Conception M.D.   On: 10/30/2014 13:12    EKG: Independently reviewed. Sinus tachycardia.   Assessment/Plan Active Problems:   Morbid obesity   Atypical chest pain   Smoker   Chest pain   1-Chest pain:  -Admit to telemetry/ continue to cycle troponin. Patient is chest pain free. Cardiology will se patient in am. If troponin continue to increase, might need to be seen by cardiology sooner.  Discussed with Dr CStanford Breed  CT angio negative for PE.  Will check ECHO. Hb-A1c, lipid panel. UDS. Nitroglycerin PRN. patient received aspirin.  IV protonix.  Suspect will need further cardia evaluation, patient is current smoker, obese, prior history of drug use.  NPO after midnight.  If troponin continue to increase, will need to consider start anticoagulation.   2-Asthma: Appears stable. Albuterol PRN.     Code Status: Lovenox.  DVT Prophylaxis: Family Communication: care discussed with patient.  Disposition Plan admit for observation, evaluation chest pain  Time spent: 75 minutes.   RNiel HummerA [facility] Pager [phone]

## 2014-10-31 ENCOUNTER — Observation Stay (HOSPITAL_BASED_OUTPATIENT_CLINIC_OR_DEPARTMENT_OTHER): Payer: Medicaid Other

## 2014-10-31 DIAGNOSIS — R0789 Other chest pain: Secondary | ICD-10-CM

## 2014-10-31 DIAGNOSIS — R079 Chest pain, unspecified: Secondary | ICD-10-CM

## 2014-10-31 DIAGNOSIS — F411 Generalized anxiety disorder: Secondary | ICD-10-CM | POA: Insufficient documentation

## 2014-10-31 DIAGNOSIS — I214 Non-ST elevation (NSTEMI) myocardial infarction: Secondary | ICD-10-CM | POA: Insufficient documentation

## 2014-10-31 DIAGNOSIS — F1910 Other psychoactive substance abuse, uncomplicated: Secondary | ICD-10-CM | POA: Diagnosis not present

## 2014-10-31 LAB — CBC
HCT: 33.8 % — ABNORMAL LOW (ref 36.0–46.0)
HEMOGLOBIN: 10.9 g/dL — AB (ref 12.0–15.0)
MCH: 28.8 pg (ref 26.0–34.0)
MCHC: 32.2 g/dL (ref 30.0–36.0)
MCV: 89.4 fL (ref 78.0–100.0)
PLATELETS: 227 10*3/uL (ref 150–400)
RBC: 3.78 MIL/uL — AB (ref 3.87–5.11)
RDW: 14.2 % (ref 11.5–15.5)
WBC: 3.3 10*3/uL — AB (ref 4.0–10.5)

## 2014-10-31 LAB — COMPREHENSIVE METABOLIC PANEL
ALBUMIN: 3.5 g/dL (ref 3.5–5.0)
ALT: 21 U/L (ref 14–54)
ANION GAP: 6 (ref 5–15)
AST: 24 U/L (ref 15–41)
Alkaline Phosphatase: 57 U/L (ref 38–126)
BILIRUBIN TOTAL: 0.6 mg/dL (ref 0.3–1.2)
BUN: 6 mg/dL (ref 6–20)
CO2: 23 mmol/L (ref 22–32)
Calcium: 8.6 mg/dL — ABNORMAL LOW (ref 8.9–10.3)
Chloride: 105 mmol/L (ref 101–111)
Creatinine, Ser: 0.71 mg/dL (ref 0.44–1.00)
GFR calc Af Amer: >60 mL/min (ref >60)
GLUCOSE: 95 mg/dL (ref 65–99)
POTASSIUM: 4.1 mmol/L (ref 3.5–5.1)
Sodium: 134 mmol/L — ABNORMAL LOW (ref 135–145)
TOTAL PROTEIN: 6.5 g/dL (ref 6.5–8.1)

## 2014-10-31 LAB — LIPID PANEL
CHOL/HDL RATIO: 6.8 ratio
Cholesterol: 183 mg/dL (ref 0–200)
HDL: 27 mg/dL — AB (ref >40)
LDL Cholesterol: 127 mg/dL — ABNORMAL HIGH (ref 0–99)
Triglycerides: 147 mg/dL (ref <150)
VLDL: 29 mg/dL (ref 0–40)

## 2014-10-31 LAB — TROPONIN I: TROPONIN I: 0.06 ng/mL — AB (ref <0.031)

## 2014-10-31 MED ORDER — SODIUM CHLORIDE 0.9 % IV SOLN: 250.0000 mL | INTRAVENOUS | Status: DC | PRN

## 2014-10-31 MED ORDER — ASPIRIN 81 MG PO CHEW
81.0000 mg | CHEWABLE_TABLET | ORAL | Status: AC
  Administered 2014-11-01: 10:00:00 81 mg via ORAL
  Filled 2014-10-31: qty 1

## 2014-10-31 MED ORDER — LORAZEPAM 0.5 MG PO TABS
0.5000 mg | ORAL_TABLET | Freq: Four times a day (QID) | ORAL | Status: DC | PRN
  Administered 2014-10-31 – 2014-11-01 (×3): 0.5 mg via ORAL
  Filled 2014-10-31 (×3): qty 1

## 2014-10-31 MED ORDER — SODIUM CHLORIDE 0.9 % WEIGHT BASED INFUSION
3.0000 mL/kg/h | INTRAVENOUS | Status: DC
  Administered 2014-11-01: 3 mL/kg/h via INTRAVENOUS

## 2014-10-31 MED ORDER — SODIUM CHLORIDE 0.9 % IJ SOLN: 3.0000 mL | INTRAMUSCULAR | Status: DC | PRN

## 2014-10-31 MED ORDER — SODIUM CHLORIDE 0.9 % WEIGHT BASED INFUSION: 1.0000 mL/kg/h | INTRAVENOUS | Status: DC

## 2014-10-31 MED ORDER — SODIUM CHLORIDE 0.9 % IJ SOLN
3.0000 mL | Freq: Two times a day (BID) | INTRAMUSCULAR | Status: DC
  Administered 2014-11-01: 09:00:00 3 mL via INTRAVENOUS

## 2014-10-31 MED ORDER — PANTOPRAZOLE SODIUM 40 MG PO TBEC
40.0000 mg | DELAYED_RELEASE_TABLET | Freq: Two times a day (BID) | ORAL | Status: DC
  Administered 2014-10-31 – 2014-11-01 (×2): 40 mg via ORAL
  Filled 2014-10-31 (×2): qty 1

## 2014-10-31 MED ORDER — ASPIRIN 325 MG PO TABS
325.0000 mg | ORAL_TABLET | Freq: Every day | ORAL | Status: DC
  Administered 2014-10-31: 325 mg via ORAL
  Filled 2014-10-31 (×2): qty 1

## 2014-10-31 NOTE — Progress Notes (Signed)
  Echocardiogram 2D Echocardiogram has been performed.  Darlina Sicilian M 10/31/2014, 1:33 PM

## 2014-10-31 NOTE — Progress Notes (Signed)
PROGRESS NOTE    Andrea Mullen MPN:[id number] DOB: May 15, 1965 DOA: 10/30/2014 PCP: Pcp Not In System  HPI/Brief narrative 49 year old female patient with history of lung cancer in remission, asthma, polysubstance abuse-tobacco, alcohol & cocaine, presented to the [facility] ED on 10/30/14 with complaints of intermittent midsternal chest pain & DOE. In the ED, EKG: Sinus tachycardia, elevated d-dimer, CTA chest negative for PE and troponin 0.10. Admitted to telemetry. Chest pain resolved. Troponins elevated but downward trend. Cardiology evaluated on 9/21 and recommend transferring to [facility] for cardiac cath on 9/22. Patient and family agreeable to plan.  Assessment/Plan:  NSTEMI - Last used cocaine 5 days PTA. Claims to have quit smoking 5 days PTA - EKG: T-wave inversion in lead 3. Troponin peak at 0.1 but since then gradually trending down - CTA chest: No PE and no acute cardiopulmonary abnormality. - Chest pain resolved. - Discussed with cardiology and their input appreciated: Recommend diagnostic cardiac cath. They have discussed with patient who is agreeable. - Although NSAIDs have been listed as allergy, patient tolerated full dose aspirin on admission. Continue aspirin 325 MG daily.  Polysubstance abuse-tobacco, cocaine & alcohol use - States that she last did cocaine 5 days PTA. Claims to drink 40 ounce beers 2 couple times a week-last time was 5 days PTA. Goes through a pack of cigarette in a week. - Cessation counseled. - Check CIWA and if elevated consider Ativan protocol.  Anxiety - When necessary Ativan  Asthma - Stable  Anemia - Unclear etiology. Follow CBCs  NSSVT - Monitor on telemetry. - TSH normal - Follow EF at Cardiac cath   DVT prophylaxis: Lovenox Code Status: Full Family Communication: Discussed with patient and her 2 daughters at bedside on 9/21 Disposition Plan: Transfer to [facility] cardiac telemetry bed. DC home pending clearance by  cardiology and medical stability. Discussed with Dr. Eliseo Squires.   Consultants:  Cardiology  Procedures:  None  Antibiotics:  None   Subjective: No chest pain.  Objective: Filed Vitals:   10/30/14 1618 10/30/14 1700 10/30/14 2145 10/31/14 0425  BP: 133/80 112/96 125/76 124/68  Pulse: 86 77 77 81  Temp:  97.6 F (36.4 C) 98.2 F (36.8 C) 98 F (36.7 C)  TempSrc:  Oral Oral Oral  Resp: '19 20 16 16  '$ Height:  '5\' 5"'$  (1.651 m)    Weight:  112.6 kg (248 lb 3.8 oz)    SpO2: 100% 100% 100% 100%    Intake/Output Summary (Last 24 hours) at 10/31/14 1156 Last data filed at 10/31/14 0455  Gross per 24 hour  Intake   1330 ml  Output      0 ml  Net   1330 ml   Filed Weights   10/30/14 1700  Weight: 112.6 kg (248 lb 3.8 oz)     Exam:  General exam: Moderately built and obese middle-aged female sitting up comfortably in bed. Slightly anxious. Respiratory system: Clear. No increased work of breathing. Cardiovascular system: S1 & S2 heard, RRR. No JVD, murmurs, gallops, clicks or pedal edema. Telemetry: Sinus rhythm with an episode of nonsustained SVT. Gastrointestinal system: Abdomen is nondistended, soft and nontender. Normal bowel sounds heard. Central nervous system: Alert and oriented. No focal neurological deficits. Extremities: Symmetric 5 x 5 power.   Data Reviewed: Basic Metabolic Panel:  Recent Labs Lab 10/30/14 1047 10/31/14 0419  NA 137 134*  K 4.0 4.1  CL 105 105  CO2 25 23  GLUCOSE 107* 95  BUN 9 6  CREATININE 0.80 0.71  CALCIUM 9.1 8.6*   Liver Function Tests:  Recent Labs Lab 10/31/14 0419  AST 24  ALT 21  ALKPHOS 57  BILITOT 0.6  PROT 6.5  ALBUMIN 3.5   No results for input(s): LIPASE, AMYLASE in the last 168 hours. No results for input(s): AMMONIA in the last 168 hours. CBC:  Recent Labs Lab 10/30/14 1047 10/31/14 0419  WBC 4.2 3.3*  HGB 11.3* 10.9*  HCT 34.7* 33.8*  MCV 89.4 89.4  PLT 229 227   Cardiac Enzymes:  Recent  Labs Lab 10/30/14 1047 10/30/14 1446 10/30/14 2100 10/31/14 0419  TROPONINI 0.09* 0.10* 0.07* 0.06*   BNP (last 3 results) No results for input(s): PROBNP in the last 8760 hours. CBG: No results for input(s): GLUCAP in the last 168 hours.  No results found for this or any previous visit (from the past 240 hour(s)).         Studies: Dg Chest 2 View  10/30/2014   CLINICAL DATA:  Chest pain, nausea and shortness of Breath for 1 week.  EXAM: CHEST  2 VIEW  COMPARISON:  04/07/2014  FINDINGS: The heart size and mediastinal contours are within normal limits. Both lungs are clear. The visualized skeletal structures are unremarkable.  IMPRESSION: No active cardiopulmonary disease.   Electronically Signed   By: Rolm Baptise M.D.   On: 10/30/2014 10:14   Ct Angio Chest Pe W/cm &/or Wo Cm  10/30/2014   CLINICAL DATA:  Chest pain, shortness of breath.  EXAM: CT ANGIOGRAPHY CHEST WITH CONTRAST  TECHNIQUE: Multidetector CT imaging of the chest was performed using the standard protocol during bolus administration of intravenous contrast. Multiplanar CT image reconstructions and MIPs were obtained to evaluate the vascular anatomy.  CONTRAST:  162m OMNIPAQUE IOHEXOL 350 MG/ML SOLN  COMPARISON:  CT scan of July 15, 2007.  FINDINGS: No pneumothorax or pleural effusion is noted. Scarring is noted anteriorly in the right upper lobe. No acute pulmonary disease is noted. There is no evidence thoracic aortic dissection or aneurysm. There is no evidence of pulmonary embolus. No mediastinal mass or adenopathy is noted. Visualized portion of upper abdomen is unremarkable no significant osseous abnormality is noted.  Review of the MIP images confirms the above findings.  IMPRESSION: No evidence of pulmonary embolus. No acute cardiopulmonary abnormality seen.   Electronically Signed   By: JMarijo Conception M.D.   On: 10/30/2014 13:12        Scheduled Meds: . docusate sodium  100 mg Oral BID  . enoxaparin  (LOVENOX) injection  40 mg Subcutaneous Q24H  . pantoprazole (PROTONIX) IV  40 mg Intravenous Q12H  . sodium chloride  3 mL Intravenous Q12H   Continuous Infusions: . sodium chloride 10 mL/hr at 10/31/14 1144    Active Problems:   Morbid obesity   Atypical chest pain   Smoker   Chest pain    Time spent: 473minutes    HONGALGI,ANAND, MD, FACP, FHM. [facility] Pager [phone] If 7PM-7AM, please contact night-coverage [email or web address] Password [facility]9/21/2016, 11:56 AM

## 2014-10-31 NOTE — Progress Notes (Signed)
Patient arrived to floor from [facility] stable no complications only complains of being in the hospital and doesn't want to be here her teenage daughter at bedside trying to encourage her to stay and seek care. IV team tried to put second IV site in for cardiac cath after one stick and unable to get she became very loud and refused any more attempts.

## 2014-10-31 NOTE — Hospital Discharge Follow-Up (Signed)
Met with the patient at the request of Myraette McGibboney, CM to inquire about PCP follow up. The patient stated that she already has a PCP - Dr Jimmye Norman.  She also noted that she is able to obtain all of her medications without any problem; however, she does not always take them as ordered. She voiced  no other needs/concerns   Update provided to Andrea Mullen, CM

## 2014-10-31 NOTE — Consult Note (Signed)
Cardiologist:  New Reason for Consult:  Chest Pain Referring Physician:   [name] is an 49 y.o. female.  HPI:   The patient is a 49yo obese female with a history of continued tobacco abuse, lung cancer 2007 or 8, asthma, drug abuse and anxiety.  Echocardiogram 2013 revealed ejection fraction of 65%, mild LVH, moderate focal basal hypertrophy of the septum. Diastolic parameters were normal. There is mild aortic valve regurgitation..  Patient presents with chest pain.  She reports the pain started about a week ago and only occurs at night when sleeping.  It is associated with N, V, SOB.  She is convinced it is her allergies.  Troponin mildly elevated 0.1. D-dimer was elevated and CT angiogram chest showed no pulmonary embolism and no acute cardiopulmonary abnormalities. EKG shows sinus tachycardia.  She is adamant that the last time she used cocaine was 30 days ago however the UDS is positive for cocaine and opiates.  She takes no prescribed meds.  Family history:  Dad is 79 and had MI in 50-60's with CABG.  Mom is 40 and healthy.    Past Medical History  Diagnosis Date  . Cancer   . Asthma   . Anxiety     Past Surgical History  Procedure Laterality Date  . Hernia repair    . Cesarean section      History reviewed. No pertinent family history.  Social History:  reports that she has been smoking Cigarettes.  She does not have any smokeless tobacco history on file. She reports that she drinks about 3.6 oz of alcohol per week. She reports that she does not use illicit drugs.  Allergies:  Allergies  Allergen Reactions  . Prednisone Anaphylaxis    seizure  . Bicillin C-R Hives  . Ibuprofen Nausea Only  . Nsaids Other (See Comments)    Hives   . Penicillins Hives    Has patient had a PCN reaction causing immediate rash, facial/tongue/throat swelling, SOB or lightheadedness with hypotension: No Has patient had a PCN reaction causing severe rash involving mucus membranes or  skin necrosis: Yes Has patient had a PCN reaction that required hospitalization No Has patient had a PCN reaction occurring within the last 10 years: Yes  If all of the above answers are "NO", then may proceed with Cephalosporin use.   . Tylenol [Acetaminophen] Nausea Only    Medications:  Scheduled Meds: . docusate sodium  100 mg Oral BID  . enoxaparin (LOVENOX) injection  40 mg Subcutaneous Q24H  . pantoprazole (PROTONIX) IV  40 mg Intravenous Q12H  . sodium chloride  3 mL Intravenous Q12H   Continuous Infusions: . sodium chloride 75 mL/hr at 10/31/14 0455   PRN Meds:.albuterol, alum & mag hydroxide-simeth, morphine injection, nitroGLYCERIN, ondansetron **OR** ondansetron (ZOFRAN) IV   Results for orders placed or performed during the hospital encounter of 10/30/14 (from the past 48 hour(s))  Basic metabolic panel     Status: Abnormal   Collection Time: 10/30/14 10:47 AM  Result Value Ref Range   Sodium 137 135 - 145 mmol/L   Potassium 4.0 3.5 - 5.1 mmol/L   Chloride 105 101 - 111 mmol/L   CO2 25 22 - 32 mmol/L   Glucose, Bld 107 (H) 65 - 99 mg/dL   BUN 9 6 - 20 mg/dL   Creatinine, Ser 0.80 0.44 - 1.00 mg/dL   Calcium 9.1 8.9 - 10.3 mg/dL   GFR calc non Af Amer >60 >60 mL/min  GFR calc Af Amer >60 >60 mL/min    Comment: (NOTE) The eGFR has been calculated using the CKD EPI equation. This calculation has not been validated in all clinical situations. eGFR's persistently <60 mL/min signify possible Chronic Kidney Disease.    Anion gap 7 5 - 15  CBC     Status: Abnormal   Collection Time: 10/30/14 10:47 AM  Result Value Ref Range   WBC 4.2 4.0 - 10.5 K/uL   RBC 3.88 3.87 - 5.11 MIL/uL   Hemoglobin 11.3 (L) 12.0 - 15.0 g/dL   HCT 34.7 (L) 36.0 - 46.0 %   MCV 89.4 78.0 - 100.0 fL   MCH 29.1 26.0 - 34.0 pg   MCHC 32.6 30.0 - 36.0 g/dL   RDW 14.2 11.5 - 15.5 %   Platelets 229 150 - 400 K/uL  Brain natriuretic peptide     Status: None   Collection Time: 10/30/14  10:47 AM  Result Value Ref Range   B Natriuretic Peptide 35.4 0.0 - 100.0 pg/mL  D-dimer, quantitative (not at [facility])     Status: Abnormal   Collection Time: 10/30/14 10:47 AM  Result Value Ref Range   D-Dimer, Quant 1.02 (H) 0.00 - 0.48 ug/mL-FEU    Comment:        AT THE INHOUSE ESTABLISHED CUTOFF VALUE OF 0.48 ug/mL FEU, THIS ASSAY HAS BEEN DOCUMENTED IN THE LITERATURE TO HAVE A SENSITIVITY AND NEGATIVE PREDICTIVE VALUE OF AT LEAST 98 TO 99%.  THE TEST RESULT SHOULD BE CORRELATED WITH AN ASSESSMENT OF THE CLINICAL PROBABILITY OF DVT / VTE.   Troponin I     Status: Abnormal   Collection Time: 10/30/14 10:47 AM  Result Value Ref Range   Troponin I 0.09 (H) <0.031 ng/mL    Comment:        PERSISTENTLY INCREASED TROPONIN VALUES IN THE RANGE OF 0.04-0.49 ng/mL CAN BE SEEN IN:       -UNSTABLE ANGINA       -CONGESTIVE HEART FAILURE       -MYOCARDITIS       -CHEST TRAUMA       -ARRYHTHMIAS       -LATE PRESENTING MYOCARDIAL INFARCTION       -COPD   CLINICAL FOLLOW-UP RECOMMENDED.   I-stat troponin, ED     Status: Abnormal   Collection Time: 10/30/14 10:52 AM  Result Value Ref Range   Troponin i, poc 0.10 (HH) 0.00 - 0.08 ng/mL   Comment NOTIFIED PHYSICIAN    Comment 3            Comment: Due to the release kinetics of cTnI, a negative result within the first hours of the onset of symptoms does not rule out myocardial infarction with certainty. If myocardial infarction is still suspected, repeat the test at appropriate intervals.   Troponin I (q 6hr x 3)     Status: Abnormal   Collection Time: 10/30/14  2:46 PM  Result Value Ref Range   Troponin I 0.10 (H) <0.031 ng/mL    Comment:        PERSISTENTLY INCREASED TROPONIN VALUES IN THE RANGE OF 0.04-0.49 ng/mL CAN BE SEEN IN:       -UNSTABLE ANGINA       -CONGESTIVE HEART FAILURE       -MYOCARDITIS       -CHEST TRAUMA       -ARRYHTHMIAS       -LATE PRESENTING MYOCARDIAL INFARCTION       -COPD  CLINICAL FOLLOW-UP  RECOMMENDED.   Troponin I (q 6hr x 3)     Status: Abnormal   Collection Time: 10/30/14  9:00 PM  Result Value Ref Range   Troponin I 0.07 (H) <0.031 ng/mL    Comment:        PERSISTENTLY INCREASED TROPONIN VALUES IN THE RANGE OF 0.04-0.49 ng/mL CAN BE SEEN IN:       -UNSTABLE ANGINA       -CONGESTIVE HEART FAILURE       -MYOCARDITIS       -CHEST TRAUMA       -ARRYHTHMIAS       -LATE PRESENTING MYOCARDIAL INFARCTION       -COPD   CLINICAL FOLLOW-UP RECOMMENDED.   TSH     Status: None   Collection Time: 10/30/14  9:00 PM  Result Value Ref Range   TSH 3.304 0.350 - 4.500 uIU/mL  Urine rapid drug screen (hosp performed)     Status: Abnormal   Collection Time: 10/30/14  9:22 PM  Result Value Ref Range   Opiates POSITIVE (A) NONE DETECTED   Cocaine POSITIVE (A) NONE DETECTED   Benzodiazepines NONE DETECTED NONE DETECTED   Amphetamines NONE DETECTED NONE DETECTED   Tetrahydrocannabinol NONE DETECTED NONE DETECTED   Barbiturates NONE DETECTED NONE DETECTED    Comment:        DRUG SCREEN FOR MEDICAL PURPOSES ONLY.  IF CONFIRMATION IS NEEDED FOR ANY PURPOSE, NOTIFY LAB WITHIN 5 DAYS.        LOWEST DETECTABLE LIMITS FOR URINE DRUG SCREEN Drug Class       Cutoff (ng/mL) Amphetamine      1000 Barbiturate      200 Benzodiazepine   884 Tricyclics       166 Opiates          300 Cocaine          300 THC              50   Troponin I (q 6hr x 3)     Status: Abnormal   Collection Time: 10/31/14  4:19 AM  Result Value Ref Range   Troponin I 0.06 (H) <0.031 ng/mL    Comment:        PERSISTENTLY INCREASED TROPONIN VALUES IN THE RANGE OF 0.04-0.49 ng/mL CAN BE SEEN IN:       -UNSTABLE ANGINA       -CONGESTIVE HEART FAILURE       -MYOCARDITIS       -CHEST TRAUMA       -ARRYHTHMIAS       -LATE PRESENTING MYOCARDIAL INFARCTION       -COPD   CLINICAL FOLLOW-UP RECOMMENDED.   Comprehensive metabolic panel     Status: Abnormal   Collection Time: 10/31/14  4:19 AM  Result Value  Ref Range   Sodium 134 (L) 135 - 145 mmol/L   Potassium 4.1 3.5 - 5.1 mmol/L   Chloride 105 101 - 111 mmol/L   CO2 23 22 - 32 mmol/L   Glucose, Bld 95 65 - 99 mg/dL   BUN 6 6 - 20 mg/dL   Creatinine, Ser 0.71 0.44 - 1.00 mg/dL   Calcium 8.6 (L) 8.9 - 10.3 mg/dL   Total Protein 6.5 6.5 - 8.1 g/dL   Albumin 3.5 3.5 - 5.0 g/dL   AST 24 15 - 41 U/L   ALT 21 14 - 54 U/L   Alkaline Phosphatase 57 38 - 126 U/L   Total Bilirubin  0.6 0.3 - 1.2 mg/dL   GFR calc non Af Amer >60 >60 mL/min   GFR calc Af Amer >60 >60 mL/min    Comment: (NOTE) The eGFR has been calculated using the CKD EPI equation. This calculation has not been validated in all clinical situations. eGFR's persistently <60 mL/min signify possible Chronic Kidney Disease.    Anion gap 6 5 - 15  CBC     Status: Abnormal   Collection Time: 10/31/14  4:19 AM  Result Value Ref Range   WBC 3.3 (L) 4.0 - 10.5 K/uL   RBC 3.78 (L) 3.87 - 5.11 MIL/uL   Hemoglobin 10.9 (L) 12.0 - 15.0 g/dL   HCT 33.8 (L) 36.0 - 46.0 %   MCV 89.4 78.0 - 100.0 fL   MCH 28.8 26.0 - 34.0 pg   MCHC 32.2 30.0 - 36.0 g/dL   RDW 14.2 11.5 - 15.5 %   Platelets 227 150 - 400 K/uL    Dg Chest 2 View  10/30/2014   CLINICAL DATA:  Chest pain, nausea and shortness of Breath for 1 week.  EXAM: CHEST  2 VIEW  COMPARISON:  04/07/2014  FINDINGS: The heart size and mediastinal contours are within normal limits. Both lungs are clear. The visualized skeletal structures are unremarkable.  IMPRESSION: No active cardiopulmonary disease.   Electronically Signed   By: Rolm Baptise M.D.   On: 10/30/2014 10:14   Ct Angio Chest Pe W/cm &/or Wo Cm  10/30/2014   CLINICAL DATA:  Chest pain, shortness of breath.  EXAM: CT ANGIOGRAPHY CHEST WITH CONTRAST  TECHNIQUE: Multidetector CT imaging of the chest was performed using the standard protocol during bolus administration of intravenous contrast. Multiplanar CT image reconstructions and MIPs were obtained to evaluate the vascular  anatomy.  CONTRAST:  142m OMNIPAQUE IOHEXOL 350 MG/ML SOLN  COMPARISON:  CT scan of July 15, 2007.  FINDINGS: No pneumothorax or pleural effusion is noted. Scarring is noted anteriorly in the right upper lobe. No acute pulmonary disease is noted. There is no evidence thoracic aortic dissection or aneurysm. There is no evidence of pulmonary embolus. No mediastinal mass or adenopathy is noted. Visualized portion of upper abdomen is unremarkable no significant osseous abnormality is noted.  Review of the MIP images confirms the above findings.  IMPRESSION: No evidence of pulmonary embolus. No acute cardiopulmonary abnormality seen.   Electronically Signed   By: JMarijo Conception M.D.   On: 10/30/2014 13:12    Review of Systems  Constitutional: Negative for fever and diaphoresis.  HENT: Positive for congestion (Nasal).   Respiratory: Positive for shortness of breath. Negative for cough.   Cardiovascular: Positive for chest pain and palpitations. Negative for orthopnea, leg swelling and PND.  Gastrointestinal: Positive for nausea and vomiting. Negative for abdominal pain, blood in stool and melena.  Musculoskeletal: Negative for myalgias.  Neurological: Negative for dizziness.  All other systems reviewed and are negative.  Blood pressure 124/68, pulse 81, temperature 98 F (36.7 C), temperature source Oral, resp. rate 16, height _0  (1.651 m), weight 248 lb 3.8 oz (112.6 kg), last menstrual period 10/29/2014, SpO2 100 %. Physical Exam  Nursing note and vitals reviewed. Constitutional: She appears well-developed. No distress.  Obese  HENT:  Head: Normocephalic and atraumatic.  Eyes: Pupils are equal, round, and reactive to light. No scleral icterus.  Neck: Normal range of motion. Neck supple.  Cardiovascular: Normal rate, regular rhythm, S1 normal and S2 normal.   No murmur heard. Pulses:  Radial pulses are 2+ on the right side, and 2+ on the left side.       Dorsalis pedis pulses are 2+ on  the right side, and 2+ on the left side.  Respiratory: Effort normal and breath sounds normal. She has no wheezes. She has no rales.  GI: Soft. Bowel sounds are normal. She exhibits no distension. There is no tenderness.  Musculoskeletal: She exhibits no edema.  Lymphadenopathy:    She has no cervical adenopathy.  Neurological: She is alert.  Skin: Skin is warm and dry.  Psychiatric: She has a normal mood and affect.    Assessment/Plan: Active Problems:   Morbid obesity   Atypical chest pain   Smoker   Chest pain   NSVT  49yo obese female with a history of continued tobacco abuse, lung cancer 2007 or 8, asthma, drug abuse and anxiety. She presents with CP which only occurs at night when sleeping.  Troponin peaked 0.10.  UDS + for cocaine and opiates.  EKG with TWI in III.  She had one episode of 5 beat NSVT.  Given the above risk factors, and patients size, coronary angiography is recommended.  The risks and benefits were discussed.  The patient is very apprehensive/anxious and wants to wait to see what the echo looks like.  No beta blockers.  Will consider a coronary CT.  HR 70's.   Tarri Fuller, [place] 10/31/2014, 8:02 AM   Patient examined chart reviewed. Possible SEMI ? Due to cocaine although she denies UDS positive She has asthma, obesity, tachycardia with short bursts SVT Not a candidate for cardiac CT or myovue.  Discussed at length favor diagnostic cath.  She needs anxiolytic discussed with Dr Algis Liming.  [facility] to [facility] And plan cath in am with Dr Irish Lack.  Risks discussed with patient and daughters willing to proceed.  Jenkins Rouge

## 2014-10-31 NOTE — Progress Notes (Signed)
Patient is very anxious to leave, explained in detail on leaving against medical advise. Patient is going to wait for now, will continue to monitor.

## 2014-10-31 NOTE — Progress Notes (Signed)

## 2014-11-01 ENCOUNTER — Encounter (HOSPITAL_COMMUNITY)
Admission: EM | Disposition: A | Discharge status: Home / Self Care | Payer: Self-pay | Source: Home / Self Care | Attending: Emergency Medicine

## 2014-11-01 ENCOUNTER — Encounter (HOSPITAL_COMMUNITY): Payer: Self-pay | Admitting: Interventional Cardiology

## 2014-11-01 DIAGNOSIS — K210 Gastro-esophageal reflux disease with esophagitis: Secondary | ICD-10-CM

## 2014-11-01 DIAGNOSIS — R000 Tachycardia, unspecified: Secondary | ICD-10-CM

## 2014-11-01 DIAGNOSIS — R079 Chest pain, unspecified: Secondary | ICD-10-CM | POA: Diagnosis not present

## 2014-11-01 DIAGNOSIS — F1910 Other psychoactive substance abuse, uncomplicated: Secondary | ICD-10-CM | POA: Diagnosis not present

## 2014-11-01 DIAGNOSIS — R7989 Other specified abnormal findings of blood chemistry: Secondary | ICD-10-CM | POA: Insufficient documentation

## 2014-11-01 DIAGNOSIS — I2510 Atherosclerotic heart disease of native coronary artery without angina pectoris: Secondary | ICD-10-CM | POA: Diagnosis not present

## 2014-11-01 DIAGNOSIS — Z720 Tobacco use: Secondary | ICD-10-CM

## 2014-11-01 DIAGNOSIS — R778 Other specified abnormalities of plasma proteins: Secondary | ICD-10-CM | POA: Insufficient documentation

## 2014-11-01 DIAGNOSIS — I214 Non-ST elevation (NSTEMI) myocardial infarction: Secondary | ICD-10-CM | POA: Diagnosis not present

## 2014-11-01 HISTORY — PX: CARDIAC CATHETERIZATION: SHX172

## 2014-11-01 LAB — PROTIME-INR
INR: 1.07 (ref 0.00–1.49)
Prothrombin Time: 14.1 seconds (ref 11.6–15.2)

## 2014-11-01 LAB — MRSA PCR SCREENING: MRSA by PCR: NEGATIVE

## 2014-11-01 LAB — PREGNANCY, URINE: PREG TEST UR: NEGATIVE

## 2014-11-01 LAB — HEMOGLOBIN A1C
Hgb A1c MFr Bld: 5.9 % — ABNORMAL HIGH (ref 4.8–5.6)
Mean Plasma Glucose: 123 mg/dL

## 2014-11-01 SURGERY — LEFT HEART CATH AND CORONARY ANGIOGRAPHY
Anesthesia: LOCAL

## 2014-11-01 MED ORDER — ENOXAPARIN SODIUM 40 MG/0.4ML ~~LOC~~ SOLN
40.0000 mg | SUBCUTANEOUS | Status: DC
  Filled 2014-11-01: qty 0.4

## 2014-11-01 MED ORDER — NITROGLYCERIN 1 MG/10 ML FOR IR/CATH LAB
INTRA_ARTERIAL | Status: AC
  Filled 2014-11-01: qty 10

## 2014-11-01 MED ORDER — GUAIFENESIN ER 600 MG PO TB12
600.0000 mg | ORAL_TABLET | Freq: Two times a day (BID) | ORAL | Status: DC
  Administered 2014-11-01: 600 mg via ORAL
  Filled 2014-11-01 (×2): qty 1

## 2014-11-01 MED ORDER — HEPARIN SODIUM (PORCINE) 1000 UNIT/ML IJ SOLN
INTRAMUSCULAR | Status: DC | PRN
  Administered 2014-11-01: 5000 [IU] via INTRAVENOUS

## 2014-11-01 MED ORDER — LIDOCAINE HCL (PF) 1 % IJ SOLN
INTRAMUSCULAR | Status: DC | PRN
  Administered 2014-11-01: 3 mL

## 2014-11-01 MED ORDER — HEPARIN (PORCINE) IN NACL 2-0.9 UNIT/ML-% IJ SOLN
INTRAMUSCULAR | Status: DC | PRN
  Administered 2014-11-01: 13:00:00

## 2014-11-01 MED ORDER — HEPARIN SODIUM (PORCINE) 1000 UNIT/ML IJ SOLN
INTRAMUSCULAR | Status: AC
  Filled 2014-11-01: qty 1

## 2014-11-01 MED ORDER — NITROGLYCERIN 0.4 MG SL SUBL: 0.4000 mg | SUBLINGUAL_TABLET | SUBLINGUAL | Status: DC | PRN

## 2014-11-01 MED ORDER — ATORVASTATIN CALCIUM 40 MG PO TABS: 40.0000 mg | ORAL_TABLET | Freq: Every day | ORAL | Status: DC

## 2014-11-01 MED ORDER — FENTANYL CITRATE (PF) 100 MCG/2ML IJ SOLN
INTRAMUSCULAR | Status: AC
  Filled 2014-11-01: qty 4

## 2014-11-01 MED ORDER — VERAPAMIL HCL 2.5 MG/ML IV SOLN
INTRAVENOUS | Status: AC
  Filled 2014-11-01: qty 2

## 2014-11-01 MED ORDER — ONDANSETRON HCL 4 MG/2ML IJ SOLN: 4.0000 mg | Freq: Four times a day (QID) | INTRAMUSCULAR | Status: DC | PRN

## 2014-11-01 MED ORDER — CARVEDILOL 3.125 MG PO TABS: 3.1250 mg | ORAL_TABLET | Freq: Two times a day (BID) | ORAL | Status: DC

## 2014-11-01 MED ORDER — HEPARIN (PORCINE) IN NACL 2-0.9 UNIT/ML-% IJ SOLN
INTRAMUSCULAR | Status: AC
  Filled 2014-11-01: qty 1000

## 2014-11-01 MED ORDER — SODIUM CHLORIDE 0.9 % IJ SOLN: 3.0000 mL | INTRAMUSCULAR | Status: DC | PRN

## 2014-11-01 MED ORDER — FENTANYL CITRATE (PF) 100 MCG/2ML IJ SOLN
INTRAMUSCULAR | Status: DC | PRN
  Administered 2014-11-01 (×2): 50 ug via INTRAVENOUS

## 2014-11-01 MED ORDER — SALINE SPRAY 0.65 % NA SOLN
1.0000 | NASAL | Status: DC | PRN
  Filled 2014-11-01: qty 44

## 2014-11-01 MED ORDER — ASPIRIN EC 81 MG PO TBEC: 81.0000 mg | DELAYED_RELEASE_TABLET | Freq: Every day | ORAL | Status: DC

## 2014-11-01 MED ORDER — SODIUM CHLORIDE 0.9 % WEIGHT BASED INFUSION
1.0000 mL/kg/h | INTRAVENOUS | Status: AC
  Administered 2014-11-01: 13:00:00 1 mL/kg/h via INTRAVENOUS

## 2014-11-01 MED ORDER — MIDAZOLAM HCL 2 MG/2ML IJ SOLN
INTRAMUSCULAR | Status: DC | PRN
  Administered 2014-11-01 (×2): 1 mg via INTRAVENOUS

## 2014-11-01 MED ORDER — SODIUM CHLORIDE 0.9 % IJ SOLN
3.0000 mL | Freq: Two times a day (BID) | INTRAMUSCULAR | Status: DC
  Administered 2014-11-01: 14:00:00 3 mL via INTRAVENOUS

## 2014-11-01 MED ORDER — LORATADINE 10 MG PO TABS
10.0000 mg | ORAL_TABLET | Freq: Every day | ORAL | Status: DC | PRN
  Administered 2014-11-01: 06:00:00 10 mg via ORAL
  Filled 2014-11-01 (×2): qty 1

## 2014-11-01 MED ORDER — MIDAZOLAM HCL 2 MG/2ML IJ SOLN
INTRAMUSCULAR | Status: AC
  Filled 2014-11-01: qty 4

## 2014-11-01 MED ORDER — VERAPAMIL HCL 2.5 MG/ML IV SOLN
INTRAVENOUS | Status: DC | PRN
  Administered 2014-11-01: 12:00:00 via INTRA_ARTERIAL

## 2014-11-01 MED ORDER — SODIUM CHLORIDE 0.9 % IV SOLN: 250.0000 mL | INTRAVENOUS | Status: DC | PRN

## 2014-11-01 MED ORDER — LIDOCAINE HCL (PF) 1 % IJ SOLN
INTRAMUSCULAR | Status: AC
  Filled 2014-11-01: qty 30

## 2014-11-01 MED ORDER — IOHEXOL 350 MG/ML SOLN
INTRAVENOUS | Status: DC | PRN
  Administered 2014-11-01: 100 mL via INTRAVENOUS

## 2014-11-01 MED ORDER — DILTIAZEM HCL 60 MG PO TABS: 60.0000 mg | ORAL_TABLET | Freq: Three times a day (TID) | ORAL | Status: DC

## 2014-11-01 SURGICAL SUPPLY — 9 items
CATH INFINITI 5 FR JL3.5 (CATHETERS) ×2 IMPLANT
CATH INFINITI JR4 5F (CATHETERS) ×2 IMPLANT
DEVICE RAD COMP TR BAND LRG (VASCULAR PRODUCTS) ×2 IMPLANT
GLIDESHEATH SLEND A-KIT 6F 22G (SHEATH) ×2 IMPLANT
KIT HEART LEFT (KITS) ×2 IMPLANT
PACK CARDIAC CATHETERIZATION (CUSTOM PROCEDURE TRAY) ×2 IMPLANT
TRANSDUCER W/STOPCOCK (MISCELLANEOUS) ×2 IMPLANT
TUBING CIL FLEX 10 FLL-RA (TUBING) ×2 IMPLANT
WIRE SAFE-T 1.5MM-J .035X260CM (WIRE) ×2 IMPLANT

## 2014-11-01 NOTE — Discharge Instructions (Signed)

## 2014-11-01 NOTE — Interval H&P Note (Signed)
Cath Lab Visit (complete for each Cath Lab visit)  Clinical Evaluation Leading to the Procedure:   ACS: Yes.    Non-ACS:    Anginal Classification: CCS II  Anti-ischemic medical therapy: Minimal Therapy (1 class of medications)  Non-Invasive Test Results: No non-invasive testing performed  Prior CABG: No previous CABG      History and Physical Interval Note:  11/01/2014 8:58 AM  Andrea Mullen  has presented today for surgery, with the diagnosis of cp  The various methods of treatment have been discussed with the patient and family. After consideration of risks, benefits and other options for treatment, the patient has consented to  Procedure(s): Left Heart Cath and Coronary Angiography (N/A) as a surgical intervention .  The patient's history has been reviewed, patient examined, no change in status, stable for surgery.  I have reviewed the patient's chart and labs.  Questions were answered to the patient's satisfaction.     Sinclair Grooms

## 2014-11-01 NOTE — Plan of Care (Signed)
     Andrea Mullen was admitted to the Hospital on 10/30/2014 and Discharged  11/01/2014 and should be excused from work/school   for 3  days starting 10/30/2014 , may return to work/school without any restrictions.  Call [name] MD, [facility]  [phone] with questions.  Thurnell Lose M.D on 11/01/2014,at 5:32 PM  [facility]   Office  [phone]

## 2014-11-01 NOTE — Progress Notes (Signed)
Patient ID: Andrea Mullen, female   DOB: 1965-08-27, 49 y.o.   MRN: [id number]    Subjective:  Denies SSCP, palpitations or Dyspnea Very anxious had panic attack yesterday  Objective:  Filed Vitals:   10/31/14 0425 10/31/14 1401 10/31/14 2115 11/01/14 0457  BP: 124/68 117/56 129/57 97/35  Pulse: 81 83 86 58  Temp: 98 F (36.7 C) 98.3 F (36.8 C) 98.3 F (36.8 C) 98.6 F (37 C)  TempSrc: Oral Oral Oral Oral  Resp: '16 18 25 20  '$ Height:      Weight:   112.583 kg (248 lb 3.2 oz) 112.6 kg (248 lb 3.8 oz)  SpO2: 100% 100% 100% 99%    Intake/Output from previous day: No intake or output data in the 24 hours ending 11/01/14 7517  Physical Exam: Affect appropriate Anxious obese black female  HEENT: normal Neck supple with no adenopathy JVP normal no bruits no thyromegaly Lungs clear with no wheezing and good diaphragmatic motion Heart:  S1/S2 no murmur, no rub, gallop or click PMI normal Abdomen: benighn, BS positve, no tenderness, no AAA no bruit.  No HSM or HJR Distal pulses intact with no bruits No edema Neuro non-focal Skin warm and dry No muscular weakness   Lab Results: Basic Metabolic Panel:  Recent Labs  10/30/14 1047 10/31/14 0419  NA 137 134*  K 4.0 4.1  CL 105 105  CO2 25 23  GLUCOSE 107* 95  BUN 9 6  CREATININE 0.80 0.71  CALCIUM 9.1 8.6*   Liver Function Tests:  Recent Labs  10/31/14 0419  AST 24  ALT 21  ALKPHOS 57  BILITOT 0.6  PROT 6.5  ALBUMIN 3.5   CBC:  Recent Labs  10/30/14 1047 10/31/14 0419  WBC 4.2 3.3*  HGB 11.3* 10.9*  HCT 34.7* 33.8*  MCV 89.4 89.4  PLT 229 227   Cardiac Enzymes:  Recent Labs  10/30/14 1446 10/30/14 2100 10/31/14 0419  TROPONINI 0.10* 0.07* 0.06*   BNP: Invalid input(s): POCBNP D-Dimer:  Recent Labs  10/30/14 1047  DDIMER 1.02*   Hemoglobin A1C:  Recent Labs  10/30/14 2100  HGBA1C 5.9*   Fasting Lipid Panel:  Recent Labs  10/31/14 0419  CHOL 183  HDL 27*  LDLCALC  127*  TRIG 147  CHOLHDL 6.8   Thyroid Function Tests:  Recent Labs  10/30/14 2100  TSH 3.304    Imaging: Dg Chest 2 View  10/30/2014   CLINICAL DATA:  Chest pain, nausea and shortness of Breath for 1 week.  EXAM: CHEST  2 VIEW  COMPARISON:  04/07/2014  FINDINGS: The heart size and mediastinal contours are within normal limits. Both lungs are clear. The visualized skeletal structures are unremarkable.  IMPRESSION: No active cardiopulmonary disease.   Electronically Signed   By: Rolm Baptise M.D.   On: 10/30/2014 10:14   Ct Angio Chest Pe W/cm &/or Wo Cm  10/30/2014   CLINICAL DATA:  Chest pain, shortness of breath.  EXAM: CT ANGIOGRAPHY CHEST WITH CONTRAST  TECHNIQUE: Multidetector CT imaging of the chest was performed using the standard protocol during bolus administration of intravenous contrast. Multiplanar CT image reconstructions and MIPs were obtained to evaluate the vascular anatomy.  CONTRAST:  15m OMNIPAQUE IOHEXOL 350 MG/ML SOLN  COMPARISON:  CT scan of July 15, 2007.  FINDINGS: No pneumothorax or pleural effusion is noted. Scarring is noted anteriorly in the right upper lobe. No acute pulmonary disease is noted. There is no evidence thoracic aortic dissection or aneurysm.  There is no evidence of pulmonary embolus. No mediastinal mass or adenopathy is noted. Visualized portion of upper abdomen is unremarkable no significant osseous abnormality is noted.  Review of the MIP images confirms the above findings.  IMPRESSION: No evidence of pulmonary embolus. No acute cardiopulmonary abnormality seen.   Electronically Signed   By: Marijo Conception, M.D.   On: 10/30/2014 13:12    Cardiac Studies:  ECG:  SR no acute ST changes    Telemetry:  NSR no arrhythmia   Echo:  EF 55-60% no RWMA  Medications:   . aspirin  81 mg Oral Pre-Cath  . aspirin  325 mg Oral Daily  . docusate sodium  100 mg Oral BID  . enoxaparin (LOVENOX) injection  40 mg Subcutaneous Q24H  . guaiFENesin  600 mg Oral  BID  . pantoprazole  40 mg Oral BID  . sodium chloride  3 mL Intravenous Q12H  . sodium chloride  3 mL Intravenous Q12H     . sodium chloride 10 mL/hr at 10/31/14 1144  . sodium chloride 1 mL/kg/hr (11/01/14 5217)    Assessment/Plan:  Chest Pain:  Asthma and obesity preclude myovue.  Tachycardia and panic attacks with short burst SVT preclude cardiac CT For diagnostic cath today. Will need ativan and fentanyl on table   GERD: on protonix if she needs stenting plavix would not be best agent  Jenkins Rouge 11/01/2014, 8:22 AM

## 2014-11-01 NOTE — Progress Notes (Signed)
TR BAND REMOVAL  LOCATION:    right radial  DEFLATED PER PROTOCOL:    Yes.    TIME BAND OFF / DRESSING APPLIED:    1500   SITE UPON ARRIVAL:    Level 0  SITE AFTER BAND REMOVAL:    Level 0  REVERSE ALLEN'S TEST:     positive  CIRCULATION SENSATION AND MOVEMENT:    Within Normal Limits   Yes.    COMMENTS:   Tolerated procedure well

## 2014-11-01 NOTE — Discharge Summary (Signed)
Andrea Mullen, is a 49 y.o. female  DOB [birth year]  MRN [id number].  Admission date:  10/30/2014  Admitting Physician  Elmarie Shiley, MD  Discharge Date:  11/01/2014   Primary MD  Pcp Not In System  Recommendations for primary care physician for things to follow:   Monitor secondaries factors for CAD   Admission Diagnosis  SOB (shortness of breath) [R06.02] Elevated troponin [R79.89] Chest pain, unspecified chest pain type [R07.9]   Discharge Diagnosis  SOB (shortness of breath) [R06.02] Elevated troponin [R79.89] Chest pain, unspecified chest pain type [R07.9]     Active Problems:   Morbid obesity   Atypical chest pain   Smoker   Chest pain   NSTEMI (non-ST elevated myocardial infarction)   Polysubstance abuse   Anxiety state   Elevated troponin      Past Medical History  Diagnosis Date  . Cancer   . Asthma   . Anxiety     Past Surgical History  Procedure Laterality Date  . Hernia repair    . Cesarean section         HPI  from the history and physical done on the day of admission:    Andrea Mullen is a 49 y.o. female with PMH significant for Lung cancer 2008 , remission, Asthma, Current smoker, Obese who presents complaining of chest pain, middle chest, sharp in quality, Intermittent. Happen at rest when she is " thinking " . She denies chest pain on exertion, does relates dyspnea on exertion. Denies drug use currently. She has been feeling sick, not feeling well. She is chest pain free after morphine.   Evaluation in the ED; EKG sinu tachycardia, D dimer elevated at 1, CT angio negative for PE, Troponin 0.10.     Hospital Course:      NSTEMI - Last used cocaine 5 days PTA. Claims to have quit smoking 5 days PTA - Seen by cardiology underwent left heart cath which showed  nonocclusive CAD, tolerated aspirin well while inpatient and now has been placed on aspirin and statin along with Cardizem, avoiding beta blocker due to cocaine use. Outpatient cardiology follow-up. Symptom-free now per cardiology cleared for discharge.   Polysubstance abuse-tobacco, cocaine & alcohol use - Positive for cocaine, also smokes and uses alcohol regularly. Counseled to quit all no signs of withdrawal.  Anxiety - When necessary Ativan  Asthma - Stable  Anemia - Outpatient follow-up with PCP for outpatient anemia workup  NSSVT - Monitor on telemetry. - TSH normal - Stable on telemetry placed on Cardizem and follow with cardiologist postdischarge       Discharge Condition: Stable  Follow UP  Follow-up Information    Follow up with [facility]     In 1 week.   Contact information:   [address]  [place] [address] [phone]      Follow up with [facility]. Schedule an appointment as soon as possible for a visit in 1 week.  Contact information:   [address] [place] [address]        Consults obtained - Cardiology  Diet and Activity recommendation: See Discharge Instructions below  Discharge Instructions           Discharge Instructions    Diet - low sodium heart healthy    Complete by:  As directed      Discharge instructions    Complete by:  As directed   Follow with Primary MD in 7 days   Get CBC, CMP, 2 view Chest X ray checked  by Primary MD next visit.    Activity: As tolerated with Full fall precautions use walker/cane & assistance as needed   Disposition Home     Diet: Heart Healthy    For Heart failure patients - Check your Weight same time everyday, if you gain over 2 pounds, or you develop in leg swelling, experience more shortness of breath or chest pain, call your Primary MD immediately. Follow Cardiac Low Salt Diet and 1.5  lit/day fluid restriction.   On your next visit with your primary care physician please Get Medicines reviewed and adjusted.   Please request your Prim.MD to go over all Hospital Tests and Procedure/Radiological results at the follow up, please get all Hospital records sent to your Prim MD by signing hospital release before you go home.   If you experience worsening of your admission symptoms, develop shortness of breath, life threatening emergency, suicidal or homicidal thoughts you must seek medical attention immediately by calling 911 or calling your MD immediately  if symptoms less severe.  You Must read complete instructions/literature along with all the possible adverse reactions/side effects for all the Medicines you take and that have been prescribed to you. Take any new Medicines after you have completely understood and accpet all the possible adverse reactions/side effects.   Do not drive, operating heavy machinery, perform activities at heights, swimming or participation in water activities or provide baby sitting services if your were admitted for syncope or siezures until you have seen by Primary MD or a Neurologist and advised to do so again.  Do not drive when taking Pain medications.    Do not take more than prescribed Pain, Sleep and Anxiety Medications  Special Instructions: If you have smoked or chewed Tobacco  in the last 2 yrs please stop smoking, stop any regular Alcohol  and or any Recreational drug use.  Wear Seat belts while driving.   Please note  You were cared for by a hospitalist during your hospital stay. If you have any questions about your discharge medications or the care you received while you were in the hospital after you are discharged, you can call the unit and asked to speak with the hospitalist on call if the hospitalist that took care of you is not available. Once you are discharged, your primary care physician will handle any further medical  issues. Please note that NO REFILLS for any discharge medications will be authorized once you are discharged, as it is imperative that you return to your primary care physician (or establish a relationship with a primary care physician if you do not have one) for your aftercare needs so that they can reassess your need for medications and monitor your lab values.     Increase activity slowly    Complete by:  As directed              Discharge Medications  Medication List    TAKE these medications        aspirin EC 81 MG tablet  Take 1 tablet (81 mg total) by mouth daily.     atorvastatin 40 MG tablet  Commonly known as:  LIPITOR  Take 1 tablet (40 mg total) by mouth daily.     diltiazem 60 MG tablet  Commonly known as:  CARDIZEM  Take 1 tablet (60 mg total) by mouth 3 (three) times daily. Please discontinue the previous Coreg prescription     nitroGLYCERIN 0.4 MG SL tablet  Commonly known as:  NITROSTAT  Place 1 tablet (0.4 mg total) under the tongue every 5 (five) minutes as needed for chest pain.     PRESCRIPTION MEDICATION  Take 1 tablet by mouth daily as needed (for sinus's). *Sinus Pill* - pharmacy has never filled a sinus pill for patient. Unable to verify name of medication        Major procedures and Radiology Reports - PLEASE review detailed and final reports for all details, in brief -   L heart Cath by Dr Linard Millers  1. Prox RCA to Mid RCA lesion, 50% stenosed. 2. RPDA lesion, 50% stenosed. 3. Ost 1st Mrg to 1st Mrg lesion, 65% stenosed.   Moderate diffuse mid RCA, eccentric moderate PDA, and less than 70% obstruction in the ostium of the small first obtuse marginal. No critical disease is noted.  Normal left ventricular size and function.  Elevated troponin, trace, uncertain etiology and possibly related to demand supply mismatch.   RECOMMENDATIONS:   Per treating team and possibly discharge later this afternoon.  Ongoing aggressive risk  factor modification including permanent smoking cessation. Attention to lipid management.   Dg Chest 2 View  10/30/2014   CLINICAL DATA:  Chest pain, nausea and shortness of Breath for 1 week.  EXAM: CHEST  2 VIEW  COMPARISON:  04/07/2014  FINDINGS: The heart size and mediastinal contours are within normal limits. Both lungs are clear. The visualized skeletal structures are unremarkable.  IMPRESSION: No active cardiopulmonary disease.   Electronically Signed   By: Rolm Baptise M.D.   On: 10/30/2014 10:14   Ct Angio Chest Pe W/cm &/or Wo Cm  10/30/2014   CLINICAL DATA:  Chest pain, shortness of breath.  EXAM: CT ANGIOGRAPHY CHEST WITH CONTRAST  TECHNIQUE: Multidetector CT imaging of the chest was performed using the standard protocol during bolus administration of intravenous contrast. Multiplanar CT image reconstructions and MIPs were obtained to evaluate the vascular anatomy.  CONTRAST:  177m OMNIPAQUE IOHEXOL 350 MG/ML SOLN  COMPARISON:  CT scan of July 15, 2007.  FINDINGS: No pneumothorax or pleural effusion is noted. Scarring is noted anteriorly in the right upper lobe. No acute pulmonary disease is noted. There is no evidence thoracic aortic dissection or aneurysm. There is no evidence of pulmonary embolus. No mediastinal mass or adenopathy is noted. Visualized portion of upper abdomen is unremarkable no significant osseous abnormality is noted.  Review of the MIP images confirms the above findings.  IMPRESSION: No evidence of pulmonary embolus. No acute cardiopulmonary abnormality seen.   Electronically Signed   By: JMarijo Conception M.D.   On: 10/30/2014 13:12    Micro Results     Recent Results (from the past 240 hour(s))  MRSA PCR Screening     Status: None   Collection Time: 10/31/14 10:35 PM  Result Value Ref Range Status   MRSA by PCR NEGATIVE NEGATIVE Final    Comment:  The GeneXpert MRSA Assay (FDA approved for NASAL specimens only), is one component of a comprehensive MRSA  colonization surveillance program. It is not intended to diagnose MRSA infection nor to guide or monitor treatment for MRSA infections.        Today   Subjective    Andrea Mullen today has no headache,no chest abdominal pain,no new weakness tingling or numbness, feels much better wants to go home today.    Objective   Blood pressure 161/83, pulse 80, temperature 98.1 F (36.7 C), temperature source Oral, resp. rate 15, height '5\' 5"'$  (1.651 m), weight 112.6 kg (248 lb 3.8 oz), last menstrual period 10/29/2014, SpO2 100 %.  No intake or output data in the 24 hours ending 11/01/14 1259  Exam  Awake Alert, Oriented x 3, No new F.N deficits, Normal affect [place].AT,PERRAL Supple Neck,No JVD, No cervical lymphadenopathy appriciated.  Symmetrical Chest wall movement, Good air movement bilaterally, CTAB RRR,No Gallops,Rubs or new Murmurs, No Parasternal Heave +ve B.Sounds, Abd Soft, Non tender, No organomegaly appriciated, No rebound -guarding or rigidity. No Cyanosis, Clubbing or edema, No new Rash or bruise    Data Review   CBC w Diff:  Lab Results  Component Value Date   WBC 3.3* 10/31/2014   HGB 10.9* 10/31/2014   HCT 33.8* 10/31/2014   PLT 227 10/31/2014   LYMPHOPCT 14 05/01/2009   MONOPCT 11 05/01/2009   EOSPCT 2 05/01/2009   BASOPCT 0 05/01/2009    CMP:  Lab Results  Component Value Date   NA 134* 10/31/2014   K 4.1 10/31/2014   CL 105 10/31/2014   CO2 23 10/31/2014   BUN 6 10/31/2014   CREATININE 0.71 10/31/2014   PROT 6.5 10/31/2014   ALBUMIN 3.5 10/31/2014   BILITOT 0.6 10/31/2014   ALKPHOS 57 10/31/2014   AST 24 10/31/2014   ALT 21 10/31/2014  . Lipid Panel     Component Value Date/Time   CHOL 183 10/31/2014 0419   TRIG 147 10/31/2014 0419   HDL 27* 10/31/2014 0419   CHOLHDL 6.8 10/31/2014 0419   VLDL 29 10/31/2014 0419   LDLCALC 127* 10/31/2014 0419      Total Time in preparing paper work, data evaluation and todays exam - 35  minutes  [name] K M.D on 11/01/2014 at 12:59 PM  [facility]   Office  [phone]

## 2014-11-01 NOTE — H&P (View-Only) (Signed)
Patient ID: AI SONNENFELD, female   DOB: 01/29/1966, 49 y.o.   MRN: [id number]    Subjective:  Denies SSCP, palpitations or Dyspnea Very anxious had panic attack yesterday  Objective:  Filed Vitals:   10/31/14 0425 10/31/14 1401 10/31/14 2115 11/01/14 0457  BP: 124/68 117/56 129/57 97/35  Pulse: 81 83 86 58  Temp: 98 F (36.7 C) 98.3 F (36.8 C) 98.3 F (36.8 C) 98.6 F (37 C)  TempSrc: Oral Oral Oral Oral  Resp: '16 18 25 20  '$ Height:      Weight:   112.583 kg (248 lb 3.2 oz) 112.6 kg (248 lb 3.8 oz)  SpO2: 100% 100% 100% 99%    Intake/Output from previous day: No intake or output data in the 24 hours ending 11/01/14 9211  Physical Exam: Affect appropriate Anxious obese black female  HEENT: normal Neck supple with no adenopathy JVP normal no bruits no thyromegaly Lungs clear with no wheezing and good diaphragmatic motion Heart:  S1/S2 no murmur, no rub, gallop or click PMI normal Abdomen: benighn, BS positve, no tenderness, no AAA no bruit.  No HSM or HJR Distal pulses intact with no bruits No edema Neuro non-focal Skin warm and dry No muscular weakness   Lab Results: Basic Metabolic Panel:  Recent Labs  10/30/14 1047 10/31/14 0419  NA 137 134*  K 4.0 4.1  CL 105 105  CO2 25 23  GLUCOSE 107* 95  BUN 9 6  CREATININE 0.80 0.71  CALCIUM 9.1 8.6*   Liver Function Tests:  Recent Labs  10/31/14 0419  AST 24  ALT 21  ALKPHOS 57  BILITOT 0.6  PROT 6.5  ALBUMIN 3.5   CBC:  Recent Labs  10/30/14 1047 10/31/14 0419  WBC 4.2 3.3*  HGB 11.3* 10.9*  HCT 34.7* 33.8*  MCV 89.4 89.4  PLT 229 227   Cardiac Enzymes:  Recent Labs  10/30/14 1446 10/30/14 2100 10/31/14 0419  TROPONINI 0.10* 0.07* 0.06*   BNP: Invalid input(s): POCBNP D-Dimer:  Recent Labs  10/30/14 1047  DDIMER 1.02*   Hemoglobin A1C:  Recent Labs  10/30/14 2100  HGBA1C 5.9*   Fasting Lipid Panel:  Recent Labs  10/31/14 0419  CHOL 183  HDL 27*  LDLCALC  127*  TRIG 147  CHOLHDL 6.8   Thyroid Function Tests:  Recent Labs  10/30/14 2100  TSH 3.304    Imaging: Dg Chest 2 View  10/30/2014   CLINICAL DATA:  Chest pain, nausea and shortness of Breath for 1 week.  EXAM: CHEST  2 VIEW  COMPARISON:  04/07/2014  FINDINGS: The heart size and mediastinal contours are within normal limits. Both lungs are clear. The visualized skeletal structures are unremarkable.  IMPRESSION: No active cardiopulmonary disease.   Electronically Signed   By: Rolm Baptise M.D.   On: 10/30/2014 10:14   Ct Angio Chest Pe W/cm &/or Wo Cm  10/30/2014   CLINICAL DATA:  Chest pain, shortness of breath.  EXAM: CT ANGIOGRAPHY CHEST WITH CONTRAST  TECHNIQUE: Multidetector CT imaging of the chest was performed using the standard protocol during bolus administration of intravenous contrast. Multiplanar CT image reconstructions and MIPs were obtained to evaluate the vascular anatomy.  CONTRAST:  133m OMNIPAQUE IOHEXOL 350 MG/ML SOLN  COMPARISON:  CT scan of July 15, 2007.  FINDINGS: No pneumothorax or pleural effusion is noted. Scarring is noted anteriorly in the right upper lobe. No acute pulmonary disease is noted. There is no evidence thoracic aortic dissection or aneurysm.  There is no evidence of pulmonary embolus. No mediastinal mass or adenopathy is noted. Visualized portion of upper abdomen is unremarkable no significant osseous abnormality is noted.  Review of the MIP images confirms the above findings.  IMPRESSION: No evidence of pulmonary embolus. No acute cardiopulmonary abnormality seen.   Electronically Signed   By: Marijo Conception, M.D.   On: 10/30/2014 13:12    Cardiac Studies:  ECG:  SR no acute ST changes    Telemetry:  NSR no arrhythmia   Echo:  EF 55-60% no RWMA  Medications:   . aspirin  81 mg Oral Pre-Cath  . aspirin  325 mg Oral Daily  . docusate sodium  100 mg Oral BID  . enoxaparin (LOVENOX) injection  40 mg Subcutaneous Q24H  . guaiFENesin  600 mg Oral  BID  . pantoprazole  40 mg Oral BID  . sodium chloride  3 mL Intravenous Q12H  . sodium chloride  3 mL Intravenous Q12H     . sodium chloride 10 mL/hr at 10/31/14 1144  . sodium chloride 1 mL/kg/hr (11/01/14 9458)    Assessment/Plan:  Chest Pain:  Asthma and obesity preclude myovue.  Tachycardia and panic attacks with short burst SVT preclude cardiac CT For diagnostic cath today. Will need ativan and fentanyl on table   GERD: on protonix if she needs stenting plavix would not be best agent  Jenkins Rouge 11/01/2014, 8:22 AM

## 2015-04-18 ENCOUNTER — Emergency Department (HOSPITAL_COMMUNITY)
Admission: EM | Admit: 2015-04-18 | Discharge: 2015-04-18 | Disposition: A | Discharge status: Home / Self Care | Payer: Medicaid Other

## 2015-04-18 NOTE — ED Notes (Addendum)
Pt advising she is leaving AMA.  Attempted to get pt back to do EKG and she stated she was leaving and going to [employer]. Pt stated "Cut this arm band off"

## 2015-11-17 ENCOUNTER — Emergency Department (HOSPITAL_COMMUNITY)
Admission: EM | Admit: 2015-11-17 | Discharge: 2015-11-17 | Disposition: A | Discharge status: Home / Self Care | Payer: Medicaid Other | Attending: Emergency Medicine | Admitting: Emergency Medicine

## 2015-11-17 ENCOUNTER — Emergency Department (HOSPITAL_COMMUNITY): Payer: Medicaid Other

## 2015-11-17 ENCOUNTER — Encounter (HOSPITAL_COMMUNITY): Payer: Self-pay

## 2015-11-17 DIAGNOSIS — F17210 Nicotine dependence, cigarettes, uncomplicated: Secondary | ICD-10-CM | POA: Insufficient documentation

## 2015-11-17 DIAGNOSIS — Z5321 Procedure and treatment not carried out due to patient leaving prior to being seen by health care provider: Secondary | ICD-10-CM | POA: Diagnosis not present

## 2015-11-17 DIAGNOSIS — R079 Chest pain, unspecified: Secondary | ICD-10-CM | POA: Diagnosis not present

## 2015-11-17 DIAGNOSIS — Z7982 Long term (current) use of aspirin: Secondary | ICD-10-CM | POA: Diagnosis not present

## 2015-11-17 DIAGNOSIS — J45909 Unspecified asthma, uncomplicated: Secondary | ICD-10-CM | POA: Diagnosis not present

## 2015-11-17 LAB — BASIC METABOLIC PANEL
Anion gap: 10 (ref 5–15)
BUN: 11 mg/dL (ref 6–20)
CO2: 23 mmol/L (ref 22–32)
Calcium: 9 mg/dL (ref 8.9–10.3)
Chloride: 102 mmol/L (ref 101–111)
Creatinine, Ser: 0.81 mg/dL (ref 0.44–1.00)
GFR calc Af Amer: >60 mL/min (ref >60)
GFR calc non Af Amer: >60 mL/min (ref >60)
Glucose, Bld: 103 mg/dL — ABNORMAL HIGH (ref 65–99)
Potassium: 3.8 mmol/L (ref 3.5–5.1)
Sodium: 135 mmol/L (ref 135–145)

## 2015-11-17 LAB — CBC
HCT: 38.7 % (ref 36.0–46.0)
Hemoglobin: 12.6 g/dL (ref 12.0–15.0)
MCH: 29.4 pg (ref 26.0–34.0)
MCHC: 32.6 g/dL (ref 30.0–36.0)
MCV: 90.4 fL (ref 78.0–100.0)
PLATELETS: 235 10*3/uL (ref 150–400)
RBC: 4.28 MIL/uL (ref 3.87–5.11)
RDW: 13.6 % (ref 11.5–15.5)
WBC: 4.5 10*3/uL (ref 4.0–10.5)

## 2015-11-17 LAB — I-STAT TROPONIN, ED: TROPONIN I, POC: 0 ng/mL (ref 0.00–0.08)

## 2015-11-17 NOTE — ED Triage Notes (Signed)
Per EMS, pt from home, woke up at 0430 with a sweat with CP radiating to left arm with left arm numbness. Pt was clammy upon ems arrival. 12 lead NSR. No cardiac hx. Pt took 324 ASA and 1 nitro prior to EMS arrival without relief. Pt rates pain 3/10. EMS VS 101/57, HR 73, RR 18, 98% RA.

## 2015-11-17 NOTE — ED Notes (Signed)
Pt unhooked self from monitor, got dressed, and walked out of emergency room. Pt was attempting to leave through EMS bay but redirected by staff. When pt name called she threw arm in the air and kept walking.

## 2016-05-31 ENCOUNTER — Encounter (HOSPITAL_COMMUNITY): Payer: Self-pay | Admitting: Emergency Medicine

## 2016-05-31 ENCOUNTER — Emergency Department (HOSPITAL_COMMUNITY)
Admission: EM | Admit: 2016-05-31 | Discharge: 2016-05-31 | Disposition: A | Discharge status: Home / Self Care | Payer: Medicaid Other | Attending: Emergency Medicine | Admitting: Emergency Medicine

## 2016-05-31 DIAGNOSIS — M25511 Pain in right shoulder: Secondary | ICD-10-CM | POA: Diagnosis present

## 2016-05-31 DIAGNOSIS — J45909 Unspecified asthma, uncomplicated: Secondary | ICD-10-CM | POA: Diagnosis not present

## 2016-05-31 DIAGNOSIS — F17210 Nicotine dependence, cigarettes, uncomplicated: Secondary | ICD-10-CM | POA: Insufficient documentation

## 2016-05-31 DIAGNOSIS — Z955 Presence of coronary angioplasty implant and graft: Secondary | ICD-10-CM | POA: Insufficient documentation

## 2016-05-31 DIAGNOSIS — Z7982 Long term (current) use of aspirin: Secondary | ICD-10-CM | POA: Insufficient documentation

## 2016-05-31 DIAGNOSIS — Z85118 Personal history of other malignant neoplasm of bronchus and lung: Secondary | ICD-10-CM | POA: Diagnosis not present

## 2016-05-31 DIAGNOSIS — I252 Old myocardial infarction: Secondary | ICD-10-CM | POA: Diagnosis not present

## 2016-05-31 DIAGNOSIS — Z79899 Other long term (current) drug therapy: Secondary | ICD-10-CM | POA: Diagnosis not present

## 2016-05-31 MED ORDER — ACETAMINOPHEN 325 MG PO TABS
650.0000 mg | ORAL_TABLET | Freq: Once | ORAL | Status: DC
Start: 2016-05-31 — End: 2016-05-31

## 2016-05-31 MED ORDER — KETOROLAC TROMETHAMINE 30 MG/ML IJ SOLN
30.0000 mg | Freq: Once | INTRAMUSCULAR | Status: AC
  Administered 2016-05-31: 30 mg via INTRAMUSCULAR
  Filled 2016-05-31: qty 1

## 2016-05-31 NOTE — Discharge Instructions (Signed)
Please read attached information. If you experience any new or worsening signs or symptoms please return to the emergency room for evaluation. Please follow-up with your primary care provider or specialist as discussed.  °

## 2016-05-31 NOTE — ED Notes (Signed)
Ortho tech at bedside applying sling.

## 2016-05-31 NOTE — ED Provider Notes (Signed)
[facility] DEPT Provider Note   CSN: [id number] Arrival date & time: 05/31/16  1333   By signing my name below, I, Soijett Blue, attest that this documentation has been prepared under the direction and in the presence of Lenn Sink, PA-C Electronically Signed: Soijett Blue, ED Scribe. 05/31/16. 3:08 PM.  History   Chief Complaint Chief Complaint  Patient presents with  . Shoulder Pain    HPI Andrea Mullen is a 51 y.o. female who presents to the Emergency Department complaining of acute-on-chronic, throbbing, right shoulder pain onset 2 weeks ago. Pt has not tried any medications for the relief of her symptoms. Pt right shoulder pain is worsened with movement and alleviated with rest and elevation. She states that she has an appointment with her sports medicine provider tomorrow for a cortisone injection. She notes that she has a hx of right shoulder pain that was alleviated for 3 years with a B12 injection administered at [facility]. She denies color change, wound, and any other symptoms. She states that she is a CA survivor and has been in remission for 10 years.    The history is provided by the patient. No language interpreter was used.    Past Medical History:  Diagnosis Date  . Anxiety   . Asthma   . Cancer [facility])     Patient Active Problem List   Diagnosis Date Noted  . Elevated troponin   . NSTEMI (non-ST elevated myocardial infarction) ([place])   . Polysubstance abuse   . Anxiety state   . Atypical chest pain 10/30/2014  . Smoker 10/30/2014  . Chest pain 10/30/2014  . Homicidal ideation   . MALIGNANT NEOPLASM BRONCHUS&LUNG UNSPEC SITE 07/11/2007  . Morbid obesity ([place]) 07/11/2007  . TOBACCO ABUSE-HISTORY OF 07/11/2007  . PULMONARY NODULE 06/07/2007    Past Surgical History:  Procedure Laterality Date  . CARDIAC CATHETERIZATION N/A 11/01/2014   Procedure: Left Heart Cath and Coronary Angiography;  Surgeon: Belva Crome, MD;  Location: [facility];  Service: Cardiovascular;  Laterality: N/A;  . CESAREAN SECTION    . HERNIA REPAIR      OB History    No data available       Home Medications    Prior to Admission medications   Medication Sig Start Date End Date Taking? Authorizing Provider  aspirin EC 81 MG tablet Take 1 tablet (81 mg total) by mouth daily. 11/01/14   Thurnell Lose, MD  atorvastatin (LIPITOR) 40 MG tablet Take 1 tablet (40 mg total) by mouth daily. 11/01/14   Thurnell Lose, MD  diltiazem (CARDIZEM) 60 MG tablet Take 1 tablet (60 mg total) by mouth 3 (three) times daily. Please discontinue the previous Coreg prescription 11/01/14   Thurnell Lose, MD  nitroGLYCERIN (NITROSTAT) 0.4 MG SL tablet Place 1 tablet (0.4 mg total) under the tongue every 5 (five) minutes as needed for chest pain. 11/01/14   Thurnell Lose, MD  PRESCRIPTION MEDICATION Take 1 tablet by mouth daily as needed (for sinus's). *Sinus Pill* - pharmacy has never filled a sinus pill for patient. Unable to verify name of medication    Historical Provider, MD    Family History No family history on file.  Social History Social History  Substance Use Topics  . Smoking status: Current Every Day Smoker    Packs/day: 0.50    Types: Cigarettes  . Smokeless tobacco: Never Used  . Alcohol use 3.6 oz/week    6 Cans of  beer per week     Allergies   Prednisone; Bicillin c-r; Ibuprofen; Nsaids; Penicillins; and Tylenol [acetaminophen]   Review of Systems Review of Systems  Musculoskeletal: Positive for arthralgias (right shoulder). Negative for joint swelling.  Skin: Negative for color change and wound.     Physical Exam Updated Vital Signs BP 136/77 (BP Location: Left Arm)   Pulse (!) 104   Temp 98.5 F (36.9 C) (Oral)   Resp 20   LMP 05/26/2016   SpO2 100%   Physical Exam  Constitutional: She is oriented to person, place, and time. She appears well-developed and well-nourished. No distress.  HENT:  Head: Normocephalic and  atraumatic.  Eyes: EOM are normal.  Neck: Neck supple.  Cardiovascular: Normal rate.   Pulmonary/Chest: Effort normal. No respiratory distress.  Abdominal: She exhibits no distension.  Musculoskeletal: Normal range of motion.       Right shoulder: She exhibits tenderness. She exhibits no bony tenderness, no swelling and normal strength.  Right shoulder atraumatic. No swelling, redness, or warmth to touch. Non-TTP. Pain with both active and passive ROM. Distal sensation, strength, and motor function intact.   Neurological: She is alert and oriented to person, place, and time.  Skin: Skin is warm and dry.  Psychiatric: She has a normal mood and affect. Her behavior is normal.  Nursing note and vitals reviewed.    ED Treatments / Results  DIAGNOSTIC STUDIES: Oxygen Saturation is 100% on RA, nl by my interpretation.    COORDINATION OF CARE: 2:35 PM Discussed treatment plan with pt at bedside which includes toradol injection and pt agreed to plan.   Procedures Procedures (including critical care time)  Medications Ordered in ED Medications  ketorolac (TORADOL) 30 MG/ML injection 30 mg (30 mg Intramuscular Given 05/31/16 1447)     Initial Impression / Assessment and Plan / ED Course  I have reviewed the triage vital signs and the nursing notes.   51 year old female presents today with acute on chronic right shoulder pain.  This is likely musculoskeletal in nature.  This is positional.  Patient requesting a B12 injection, informed her that that would not be performed here in the ED setting.  She was given a shot of Toradol which seemed to improve her symptoms.  She has no neurological deficits, no signs of infection, no concern for referred pain.  Patient referred to orthopedist which she has an appointment with tomorrow for ongoing management.  She will be discharged home with symptom Medicare instructions and strict return precautions.  She verbalized understanding and agreement to  today's plan had no further questions or concerns   Final Clinical Impressions(s) / ED Diagnoses   Final diagnoses:  Acute pain of right shoulder    New Prescriptions Discharge Medication List as of 05/31/2016  3:08 PM     I personally performed the services described in this documentation, which was scribed in my presence. The recorded information has been reviewed and is accurate.    Okey Regal, PA-C 05/31/16 1820    Charlesetta Shanks, MD 06/01/16 [phone]

## 2016-05-31 NOTE — ED Triage Notes (Signed)
Patient states that she had issues with right shoulder problems and when she went to [facility] and had a B12 shot her pain in right shoulder went away for 3 years. Patient doesn't want any pain pills, just wants a shot to knock pain out.

## 2016-06-09 ENCOUNTER — Encounter (HOSPITAL_COMMUNITY): Payer: Self-pay | Admitting: Emergency Medicine

## 2016-06-09 ENCOUNTER — Emergency Department (HOSPITAL_COMMUNITY)
Admission: EM | Admit: 2016-06-09 | Discharge: 2016-06-09 | Disposition: A | Discharge status: Home / Self Care | Payer: Medicaid Other | Attending: Physician Assistant | Admitting: Physician Assistant

## 2016-06-09 ENCOUNTER — Emergency Department (HOSPITAL_COMMUNITY): Payer: Medicaid Other

## 2016-06-09 DIAGNOSIS — Y939 Activity, unspecified: Secondary | ICD-10-CM | POA: Diagnosis not present

## 2016-06-09 DIAGNOSIS — I252 Old myocardial infarction: Secondary | ICD-10-CM | POA: Diagnosis not present

## 2016-06-09 DIAGNOSIS — S199XXA Unspecified injury of neck, initial encounter: Secondary | ICD-10-CM | POA: Insufficient documentation

## 2016-06-09 DIAGNOSIS — J45909 Unspecified asthma, uncomplicated: Secondary | ICD-10-CM | POA: Diagnosis not present

## 2016-06-09 DIAGNOSIS — Z7982 Long term (current) use of aspirin: Secondary | ICD-10-CM | POA: Insufficient documentation

## 2016-06-09 DIAGNOSIS — Z79899 Other long term (current) drug therapy: Secondary | ICD-10-CM | POA: Diagnosis not present

## 2016-06-09 DIAGNOSIS — Y999 Unspecified external cause status: Secondary | ICD-10-CM | POA: Diagnosis not present

## 2016-06-09 DIAGNOSIS — M549 Dorsalgia, unspecified: Secondary | ICD-10-CM | POA: Insufficient documentation

## 2016-06-09 DIAGNOSIS — F17210 Nicotine dependence, cigarettes, uncomplicated: Secondary | ICD-10-CM | POA: Diagnosis not present

## 2016-06-09 DIAGNOSIS — Y92410 Unspecified street and highway as the place of occurrence of the external cause: Secondary | ICD-10-CM | POA: Insufficient documentation

## 2016-06-09 MED ORDER — CYCLOBENZAPRINE HCL 10 MG PO TABS: 10.0000 mg | ORAL_TABLET | Freq: Two times a day (BID) | ORAL | 0 refills | Status: DC | PRN
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
## 2016-06-09 NOTE — Discharge Instructions (Signed)
Your x-rays are reassuring. Please return if you have any numbness tingling or increasing neck pain. Otherwise use Flexeril to help much relax your muscles. We expect your pain will be a little bit worse tomorrow. Please return follow-up with primary care physician.  To find a primary care or specialty doctor please call [phone] or [phone] to access "[facility] Doctor Service."  You may also go on the [facility] website at [email or web address]  There are also multiple [facility], [facility] and [facility] practices throughout the [place] that are frequently accepting new patients. You may find a clinic that is close to your home and contact them.  [facility] -  [address] [place] [address] [phone]  [facility] in [place] (also locations in [place] and [place]) -  [place] [address]  [place] [address] [phone]

## 2016-12-12 ENCOUNTER — Emergency Department (HOSPITAL_COMMUNITY): Payer: No Typology Code available for payment source

## 2016-12-12 ENCOUNTER — Encounter (HOSPITAL_COMMUNITY): Payer: Self-pay

## 2016-12-12 ENCOUNTER — Emergency Department (HOSPITAL_COMMUNITY)
Admission: EM | Admit: 2016-12-12 | Discharge: 2016-12-12 | Disposition: A | Discharge status: Home / Self Care | Payer: No Typology Code available for payment source | Attending: Emergency Medicine | Admitting: Emergency Medicine

## 2016-12-12 DIAGNOSIS — Z79899 Other long term (current) drug therapy: Secondary | ICD-10-CM | POA: Insufficient documentation

## 2016-12-12 DIAGNOSIS — F17210 Nicotine dependence, cigarettes, uncomplicated: Secondary | ICD-10-CM | POA: Insufficient documentation

## 2016-12-12 DIAGNOSIS — I252 Old myocardial infarction: Secondary | ICD-10-CM | POA: Diagnosis not present

## 2016-12-12 DIAGNOSIS — M5432 Sciatica, left side: Secondary | ICD-10-CM | POA: Diagnosis not present

## 2016-12-12 DIAGNOSIS — J45909 Unspecified asthma, uncomplicated: Secondary | ICD-10-CM | POA: Insufficient documentation

## 2016-12-12 DIAGNOSIS — M25552 Pain in left hip: Secondary | ICD-10-CM | POA: Insufficient documentation

## 2016-12-12 DIAGNOSIS — R519 Headache, unspecified: Secondary | ICD-10-CM

## 2016-12-12 DIAGNOSIS — Z7982 Long term (current) use of aspirin: Secondary | ICD-10-CM | POA: Insufficient documentation

## 2016-12-12 DIAGNOSIS — R51 Headache: Secondary | ICD-10-CM | POA: Insufficient documentation

## 2016-12-12 LAB — POC URINE PREG, ED: PREG TEST UR: NEGATIVE

## 2016-12-12 MED ORDER — METHOCARBAMOL 500 MG PO TABS
750.0000 mg | ORAL_TABLET | Freq: Once | ORAL | Status: AC
  Administered 2016-12-12: 750 mg via ORAL
  Filled 2016-12-12: qty 2

## 2016-12-12 MED ORDER — HYDROCODONE-ACETAMINOPHEN 5-325 MG PO TABS: 1.0000 | ORAL_TABLET | Freq: Four times a day (QID) | ORAL | 0 refills | Status: DC | PRN

## 2016-12-12 MED ORDER — METHOCARBAMOL 500 MG PO TABS: 500.0000 mg | ORAL_TABLET | Freq: Two times a day (BID) | ORAL | 0 refills | Status: DC

## 2016-12-12 MED ORDER — HYDROCODONE-ACETAMINOPHEN 5-325 MG PO TABS
1.0000 | ORAL_TABLET | Freq: Once | ORAL | Status: AC
  Administered 2016-12-12: 1 via ORAL
  Filled 2016-12-12: qty 1

## 2016-12-12 NOTE — ED Notes (Signed)
Patient transported to X-ray 

## 2016-12-12 NOTE — ED Triage Notes (Signed)
Pt ambulated to triage room without difficulty.
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
## 2016-12-12 NOTE — ED Notes (Signed)
Pt still having menstrual cycle; xray states they need pregnancy test on pt. Pt unable to urinate at this time. EDP made aware.

## 2016-12-12 NOTE — Discharge Instructions (Signed)
Take pain medications as prescribed.  Follow-up with family doctor on Wednesday as scheduled.  Return if any trouble controlling your bowels or bladder, any weakness in the extremities, any new concerning symptoms.

## 2016-12-12 NOTE — ED Notes (Signed)
Patient verbalized understanding of discharge instructions and denies any further needs or questions at this time. VS stable. Patient ambulatory with steady gait. Escorted to ED entrance in wheelchair.
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
## 2017-02-22 ENCOUNTER — Encounter (HOSPITAL_COMMUNITY): Payer: Self-pay

## 2017-02-22 ENCOUNTER — Other Ambulatory Visit: Payer: Self-pay

## 2017-02-22 ENCOUNTER — Emergency Department (HOSPITAL_COMMUNITY)
Admission: EM | Admit: 2017-02-22 | Discharge: 2017-02-22 | Disposition: A | Discharge status: Home / Self Care | Payer: Medicaid Other | Attending: Emergency Medicine | Admitting: Emergency Medicine

## 2017-02-22 DIAGNOSIS — F17210 Nicotine dependence, cigarettes, uncomplicated: Secondary | ICD-10-CM | POA: Insufficient documentation

## 2017-02-22 DIAGNOSIS — Z853 Personal history of malignant neoplasm of breast: Secondary | ICD-10-CM | POA: Insufficient documentation

## 2017-02-22 DIAGNOSIS — Z7982 Long term (current) use of aspirin: Secondary | ICD-10-CM | POA: Diagnosis not present

## 2017-02-22 DIAGNOSIS — J45909 Unspecified asthma, uncomplicated: Secondary | ICD-10-CM | POA: Diagnosis not present

## 2017-02-22 DIAGNOSIS — G8929 Other chronic pain: Secondary | ICD-10-CM | POA: Diagnosis not present

## 2017-02-22 DIAGNOSIS — M25511 Pain in right shoulder: Secondary | ICD-10-CM | POA: Insufficient documentation

## 2017-02-22 DIAGNOSIS — Z79899 Other long term (current) drug therapy: Secondary | ICD-10-CM | POA: Diagnosis not present

## 2017-02-22 MED ORDER — CYCLOBENZAPRINE HCL 10 MG PO TABS: 10.0000 mg | ORAL_TABLET | Freq: Two times a day (BID) | ORAL | 0 refills | Status: DC | PRN

## 2017-02-22 MED ORDER — MORPHINE SULFATE (PF) 4 MG/ML IV SOLN
4.0000 mg | Freq: Once | INTRAVENOUS | Status: AC
  Administered 2017-02-22: 4 mg via INTRAMUSCULAR
  Filled 2017-02-22: qty 1
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
## 2017-02-22 NOTE — ED Notes (Signed)
Pt called daughter to come get her.
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
## 2017-05-20 ENCOUNTER — Encounter (HOSPITAL_COMMUNITY): Payer: Self-pay | Admitting: *Deleted

## 2017-05-20 ENCOUNTER — Other Ambulatory Visit: Payer: Self-pay

## 2017-05-20 ENCOUNTER — Emergency Department (HOSPITAL_COMMUNITY)
Admission: EM | Admit: 2017-05-20 | Discharge: 2017-05-20 | Disposition: A | Discharge status: Home / Self Care | Payer: Medicaid Other | Attending: Emergency Medicine | Admitting: Emergency Medicine

## 2017-05-20 DIAGNOSIS — Z7982 Long term (current) use of aspirin: Secondary | ICD-10-CM | POA: Diagnosis not present

## 2017-05-20 DIAGNOSIS — Z85118 Personal history of other malignant neoplasm of bronchus and lung: Secondary | ICD-10-CM | POA: Diagnosis not present

## 2017-05-20 DIAGNOSIS — J45909 Unspecified asthma, uncomplicated: Secondary | ICD-10-CM | POA: Insufficient documentation

## 2017-05-20 DIAGNOSIS — R51 Headache: Secondary | ICD-10-CM | POA: Diagnosis not present

## 2017-05-20 DIAGNOSIS — F17210 Nicotine dependence, cigarettes, uncomplicated: Secondary | ICD-10-CM | POA: Diagnosis not present

## 2017-05-20 DIAGNOSIS — Z79899 Other long term (current) drug therapy: Secondary | ICD-10-CM | POA: Insufficient documentation

## 2017-05-20 DIAGNOSIS — R519 Headache, unspecified: Secondary | ICD-10-CM

## 2017-05-20 MED ORDER — DIPHENHYDRAMINE HCL 50 MG/ML IJ SOLN
50.0000 mg | Freq: Once | INTRAMUSCULAR | Status: AC
  Administered 2017-05-20: 50 mg via INTRAMUSCULAR
  Filled 2017-05-20: qty 1

## 2017-05-20 MED ORDER — ONDANSETRON HCL 4 MG PO TABS
4.0000 mg | ORAL_TABLET | Freq: Once | ORAL | Status: AC
  Administered 2017-05-20: 4 mg via ORAL
  Filled 2017-05-20: qty 1

## 2017-05-20 MED ORDER — ACETAMINOPHEN 500 MG PO TABS: 500.0000 mg | ORAL_TABLET | Freq: Four times a day (QID) | ORAL | 0 refills | Status: DC | PRN

## 2017-05-20 MED ORDER — ONDANSETRON HCL 4 MG PO TABS: 4.0000 mg | ORAL_TABLET | Freq: Three times a day (TID) | ORAL | 0 refills | Status: DC | PRN

## 2017-05-20 NOTE — ED Triage Notes (Signed)
Pt reports headache for over a week. Pt states that she has pain behind her eyes. Pt states that she has been evaluated for the same in the past. Pt alert and oriented in triage. No neuro deficits.

## 2017-05-20 NOTE — ED Provider Notes (Signed)
[facility] EMERGENCY DEPARTMENT Provider Note   CSN: [id number] Arrival date & time: 05/20/17  0854     History   Chief Complaint Chief Complaint  Patient presents with  . Headache    HPI Andrea Mullen is a 52 y.o. female.  52 year old female with prior history of anxiety, and STEMI, polysubstance abuse presents with complaint of headache.  Patient reports that she has had this headache for the last 2 days.  She denies any associated fever.  She denies neck stiffness, visual change, focal weakness, or other complaint.  She reports that she is taken some Tylenol at home with minimal improvement.  She reports that she normally takes Vicodin for her headache but she ran out.  The history is provided by the patient.  Headache   This is a new problem. The current episode started 2 days ago. The problem occurs hourly. The problem has not changed since onset.The headache is associated with nothing. The pain is located in the bilateral region. The pain is mild. The pain does not radiate. She has tried acetaminophen for the symptoms. The treatment provided no relief.    Past Medical History:  Diagnosis Date  . Anxiety   . Asthma   . Cancer [facility])     Patient Active Problem List   Diagnosis Date Noted  . Elevated troponin   . NSTEMI (non-ST elevated myocardial infarction) ([place])   . Polysubstance abuse ([place])   . Anxiety state   . Atypical chest pain 10/30/2014  . Smoker 10/30/2014  . Chest pain 10/30/2014  . Homicidal ideation   . MALIGNANT NEOPLASM BRONCHUS&LUNG UNSPEC SITE 07/11/2007  . Morbid obesity ([place]) 07/11/2007  . TOBACCO ABUSE-HISTORY OF 07/11/2007  . PULMONARY NODULE 06/07/2007    Past Surgical History:  Procedure Laterality Date  . CARDIAC CATHETERIZATION N/A 11/01/2014   Procedure: Left Heart Cath and Coronary Angiography;  Surgeon: Belva Crome, MD;  Location: [place] CV LAB;  Service: Cardiovascular;  Laterality: N/A;  . CESAREAN  SECTION    . HERNIA REPAIR       OB History   None      Home Medications    Prior to Admission medications   Medication Sig Start Date End Date Taking? Authorizing Provider  acetaminophen (TYLENOL) 500 MG tablet Take 1 tablet (500 mg total) by mouth every 6 (six) hours as needed. 05/20/17   Valarie Merino, MD  aspirin EC 81 MG tablet Take 1 tablet (81 mg total) by mouth daily. 11/01/14   Thurnell Lose, MD  atorvastatin (LIPITOR) 40 MG tablet Take 1 tablet (40 mg total) by mouth daily. 11/01/14   Thurnell Lose, MD  cyclobenzaprine (FLEXERIL) 10 MG tablet Take 1 tablet (10 mg total) by mouth 2 (two) times daily as needed for muscle spasms. 02/22/17   Isla Pence, MD  diltiazem (CARDIZEM) 60 MG tablet Take 1 tablet (60 mg total) by mouth 3 (three) times daily. Please discontinue the previous Coreg prescription 11/01/14   Thurnell Lose, MD  HYDROcodone-acetaminophen (NORCO) 5-325 MG tablet Take 1 tablet by mouth every 6 (six) hours as needed for moderate pain. 12/12/16   Kirichenko, Tatyana, PA-C  methocarbamol (ROBAXIN) 500 MG tablet Take 1 tablet (500 mg total) by mouth 2 (two) times daily. 12/12/16   Kirichenko, Lahoma Rocker, PA-C  nitroGLYCERIN (NITROSTAT) 0.4 MG SL tablet Place 1 tablet (0.4 mg total) under the tongue every 5 (five) minutes as needed for chest pain. 11/01/14   Candiss Norse,  Margaree Mackintosh, MD  ondansetron (ZOFRAN) 4 MG tablet Take 1 tablet (4 mg total) by mouth every 8 (eight) hours as needed for nausea or vomiting. 05/20/17   Valarie Merino, MD  PRESCRIPTION MEDICATION Take 1 tablet by mouth daily as needed (for sinus's). *Sinus Pill* - pharmacy has never filled a sinus pill for patient. Unable to verify name of medication    [provider]    Family History No family history on file.  Social History Social History   Tobacco Use  . Smoking status: Current Every Day Smoker    Packs/day: 0.50    Types: Cigarettes  . Smokeless tobacco: Never Used  Substance  Use Topics  . Alcohol use: Yes    Alcohol/week: 3.6 oz    Types: 6 Cans of beer per week  . Drug use: Yes    Types: Marijuana     Allergies   Prednisone; Bicillin c-r; Ibuprofen; Nsaids; Penicillins; and Tylenol [acetaminophen]   Review of Systems Review of Systems  Neurological: Positive for headaches.  All other systems reviewed and are negative.    Physical Exam Updated Vital Signs BP 130/69   Pulse 84   Temp 97.7 F (36.5 C) (Oral)   Resp 18   SpO2 100%   Physical Exam  Constitutional: She is oriented to person, place, and time. She appears well-developed and well-nourished. No distress.  HENT:  Head: Normocephalic and atraumatic.  Mouth/Throat: Oropharynx is clear and moist.  Eyes: Pupils are equal, round, and reactive to light. Conjunctivae and EOM are normal.  Neck: Normal range of motion. Neck supple.  Cardiovascular: Normal rate, regular rhythm and normal heart sounds.  Pulmonary/Chest: Effort normal and breath sounds normal. No respiratory distress.  Abdominal: Soft. She exhibits no distension. There is no tenderness.  Musculoskeletal: Normal range of motion. She exhibits no edema or deformity.  Neurological: She is alert and oriented to person, place, and time. She has normal strength.  Alert and oriented x3  Normal speech  No facial droop  Normal gait  Skin: Skin is warm and dry.  Psychiatric: She has a normal mood and affect.  Nursing note and vitals reviewed.    ED Treatments / Results  Labs (all labs ordered are listed, but only abnormal results are displayed) Labs Reviewed - No data to display  EKG None  Radiology No results found.  Procedures Procedures (including critical care time)  Medications Ordered in ED Medications  diphenhydrAMINE (BENADRYL) injection 50 mg (50 mg Intramuscular Given 05/20/17 1157)  ondansetron (ZOFRAN) tablet 4 mg (4 mg Oral Given 05/20/17 1157)     Initial Impression / Assessment and Plan / ED Course    I have reviewed the triage vital signs and the nursing notes.  Pertinent labs & imaging results that were available during my care of the patient were reviewed by me and considered in my medical decision making (see chart for details).     MDM  Screen complete  Patient is presenting with headache that is most likely a migraine.  No red flag symptoms or findings on exam.  Patient appears much improved following ED treatment.  She now desires discharge home.  Close follow-up is advised.  Strict return precautions are given and understood.  Final Clinical Impressions(s) / ED Diagnoses   Final diagnoses:  Acute nonintractable headache, unspecified headache type    ED Discharge Orders        Ordered    acetaminophen (TYLENOL) 500 MG tablet  Every 6  hours PRN     05/20/17 1314    ondansetron (ZOFRAN) 4 MG tablet  Every 8 hours PRN     05/20/17 1314       Valarie Merino, MD 05/20/17 1330

## 2017-06-06 ENCOUNTER — Emergency Department (HOSPITAL_COMMUNITY)
Admission: EM | Admit: 2017-06-06 | Discharge: 2017-06-07 | Discharge status: Against Medical Advice | Payer: Medicaid Other

## 2017-06-06 NOTE — ED Notes (Signed)
Pt called for triage, no answer

## 2017-06-06 NOTE — ED Notes (Signed)
Pt came up to staff asking if we "analysed food", when staff asked her what she ment she stated that she wanted her slice of cake to be x-rayed because she bit into a piece that had some glass in it. Staff let the patient know that we would look at the cake but not x-ray it. Pt got upset with staff and walked out.

## 2017-06-07 ENCOUNTER — Emergency Department (HOSPITAL_COMMUNITY)
Admission: EM | Admit: 2017-06-07 | Discharge: 2017-06-07 | Disposition: A | Discharge status: Other Acute Inpatient Hospital | Payer: Medicaid Other

## 2018-08-04 ENCOUNTER — Encounter (HOSPITAL_COMMUNITY): Payer: Self-pay | Admitting: Emergency Medicine

## 2018-08-04 ENCOUNTER — Other Ambulatory Visit: Payer: Self-pay

## 2018-08-04 ENCOUNTER — Emergency Department (HOSPITAL_COMMUNITY): Payer: Medicaid Other

## 2018-08-04 ENCOUNTER — Emergency Department (HOSPITAL_COMMUNITY)
Admission: EM | Admit: 2018-08-04 | Discharge: 2018-08-04 | Disposition: A | Discharge status: Home / Self Care | Payer: Medicaid Other | Attending: Emergency Medicine | Admitting: Emergency Medicine

## 2018-08-04 DIAGNOSIS — F17210 Nicotine dependence, cigarettes, uncomplicated: Secondary | ICD-10-CM | POA: Diagnosis not present

## 2018-08-04 DIAGNOSIS — I2510 Atherosclerotic heart disease of native coronary artery without angina pectoris: Secondary | ICD-10-CM | POA: Diagnosis not present

## 2018-08-04 DIAGNOSIS — J45909 Unspecified asthma, uncomplicated: Secondary | ICD-10-CM | POA: Diagnosis not present

## 2018-08-04 DIAGNOSIS — M25512 Pain in left shoulder: Secondary | ICD-10-CM | POA: Insufficient documentation

## 2018-08-04 DIAGNOSIS — R61 Generalized hyperhidrosis: Secondary | ICD-10-CM | POA: Diagnosis not present

## 2018-08-04 LAB — CBC
HCT: 40.2 % (ref 36.0–46.0)
Hemoglobin: 12.8 g/dL (ref 12.0–15.0)
MCH: 29.6 pg (ref 26.0–34.0)
MCHC: 31.8 g/dL (ref 30.0–36.0)
MCV: 92.8 fL (ref 80.0–100.0)
Platelets: 216 10*3/uL (ref 150–400)
RBC: 4.33 MIL/uL (ref 3.87–5.11)
RDW: 13.4 % (ref 11.5–15.5)
WBC: 4.9 10*3/uL (ref 4.0–10.5)
nRBC: 0 % (ref 0.0–0.2)

## 2018-08-04 LAB — TROPONIN I (HIGH SENSITIVITY)
Troponin I (High Sensitivity): 15 ng/L (ref <18)
Troponin I (High Sensitivity): 17 ng/L (ref <18)

## 2018-08-04 LAB — BASIC METABOLIC PANEL
Anion gap: 8 (ref 5–15)
BUN: 13 mg/dL (ref 6–20)
CO2: 26 mmol/L (ref 22–32)
Calcium: 9.1 mg/dL (ref 8.9–10.3)
Chloride: 104 mmol/L (ref 98–111)
Creatinine, Ser: 0.89 mg/dL (ref 0.44–1.00)
GFR calc Af Amer: >60 mL/min (ref >60)
GFR calc non Af Amer: >60 mL/min (ref >60)
Glucose, Bld: 119 mg/dL — ABNORMAL HIGH (ref 70–99)
Potassium: 4.2 mmol/L (ref 3.5–5.1)
Sodium: 138 mmol/L (ref 135–145)

## 2018-08-04 MED ORDER — CYCLOBENZAPRINE HCL 10 MG PO TABS: 10.0000 mg | ORAL_TABLET | Freq: Two times a day (BID) | ORAL | 0 refills | Status: DC | PRN

## 2018-08-04 NOTE — ED Provider Notes (Signed)
[facility] Emergency Department Provider Note MRN:  [id number]  Arrival date & time: 08/04/18     Chief Complaint   Back Pain and Shoulder Pain   History of Present Illness   Andrea Mullen is a 53 y.o. year-old female with a history of CAD presenting to the ED with chief complaint of shoulder pain.  Patient explains that 4 days ago she woke up with a painful stiff neck.  Pain in the neck has been persistent since that time.  Today, about 1 hour prior to arrival, patient experienced some sudden onset left shoulder and arm pain associated with diaphoresis.  Denies chest pain, no shortness of breath, no nausea, no vomiting.  Pain in the arm is constant, described as sharp.  Worse in certain positions.  Patient denies numbness or weakness to the arms or legs, no bowel or bladder dysfunction.  Pain moderate in severity, no other exacerbating or alleviating factors.  Review of Systems  A complete 10 system review of systems was obtained and all systems are negative except as noted in the HPI and PMH.   Patient's Health History    Past Medical History:  Diagnosis Date  . Anxiety   . Asthma   . Cancer [facility])     Past Surgical History:  Procedure Laterality Date  . CARDIAC CATHETERIZATION N/A 11/01/2014   Procedure: Left Heart Cath and Coronary Angiography;  Surgeon: Belva Crome, MD;  Location: [place] CV LAB;  Service: Cardiovascular;  Laterality: N/A;  . CESAREAN SECTION    . HERNIA REPAIR      No family history on file.  Social History   Socioeconomic History  . Marital status: Single    Spouse name: Not on file  . Number of children: Not on file  . Years of education: Not on file  . Highest education level: Not on file  Occupational History  . Not on file  Social Needs  . Financial resource strain: Not on file  . Food insecurity    Worry: Not on file    Inability: Not on file  . Transportation needs    Medical: Not on file    Non-medical: Not on  file  Tobacco Use  . Smoking status: Current Every Day Smoker    Packs/day: 0.50    Types: Cigarettes  . Smokeless tobacco: Never Used  Substance and Sexual Activity  . Alcohol use: Yes    Alcohol/week: 6.0 standard drinks    Types: 6 Cans of beer per week  . Drug use: Yes    Types: Marijuana  . Sexual activity: Yes  Lifestyle  . Physical activity    Days per week: Not on file    Minutes per session: Not on file  . Stress: Not on file  Relationships  . Social Herbalist on phone: Not on file    Gets together: Not on file    Attends religious service: Not on file    Active member of club or organization: Not on file    Attends meetings of clubs or organizations: Not on file    Relationship status: Not on file  . Intimate partner violence    Fear of current or ex partner: Not on file    Emotionally abused: Not on file    Physically abused: Not on file    Forced sexual activity: Not on file  Other Topics Concern  . Not on file  Social History Narrative  .  Not on file     Physical Exam  Vital Signs and Nursing Notes reviewed Vitals:   08/04/18 1500 08/04/18 1619  BP: (!) 147/84 131/79  Pulse: 77 84  Resp: 14 18  Temp:  98 F (36.7 C)  SpO2: 100% 100%    CONSTITUTIONAL: Well-appearing, NAD NEURO:  Alert and oriented x 3, no focal deficits EYES:  eyes equal and reactive ENT/NECK:  no LAD, no JVD CARDIO: Regular rate, well-perfused, normal S1 and S2 PULM:  CTAB no wheezing or rhonchi GI/GU:  normal bowel sounds, non-distended, non-tender MSK/SPINE:  No gross deformities, no edema; normal range of motion of the left shoulder, no tenderness palpation. SKIN:  no rash, atraumatic PSYCH:  Appropriate speech and behavior  Diagnostic and Interventional Summary    EKG Interpretation  Date/Time:  Thursday August 04 2018 12:36:59 EDT Ventricular Rate:  77 PR Interval:    QRS Duration: 90 QT Interval:  401 QTC Calculation: 454 R Axis:   24 Text  Interpretation:  Sinus rhythm Abnormal R-wave progression, early transition Confirmed by Gerlene Fee [phone]) on 08/04/2018 1:01:20 PM      Labs Reviewed  BASIC METABOLIC PANEL - Abnormal; Notable for the following components:      Result Value   Glucose, Bld 119 (*)    All other components within normal limits  CBC  TROPONIN I (HIGH SENSITIVITY)  TROPONIN I (HIGH SENSITIVITY)    DG Chest Port 1 View  Final Result      Medications - No data to display   Procedures Critical Care  ED Course and Medical Decision Making  I have reviewed the triage vital signs and the nursing notes.  Pertinent labs & imaging results that were available during my care of the patient were reviewed by me and considered in my medical decision making (see below for details).  Suspect radicular cervical pain as etiology of patient's arm pain, however given patient's history of CAD and patient's associated symptom of diaphoresis during the onset of left shoulder/arm pain, will evaluate with 2 troponins.  EKG is without ischemic changes.  Work-up is unrevealing.  Troponin is less than 182, per protocol is a candidate for discharge.  Low heart score, atypical presentation, admission not indicated.  Patient is without chest pain or shortness of breath, no evidence of DVT, doubt pulmonary medicine.  Patient does have a reported history of lung cancer for which she is not seeking treatment.  Had a discussion with her about this and strongly encouraged PCP follow-up.  Patient is moving to [place] next week and states that she will consider establishing with a PCP.  After the discussed management above, the patient was determined to be safe for discharge.  The patient was in agreement with this plan and all questions regarding their care were answered.  ED return precautions were discussed and the patient will return to the ED with any significant worsening of condition.  Barth Kirks. Sedonia Small, [place] [email or web address]  Final Clinical Impressions(s) / ED Diagnoses     ICD-10-CM   1. Acute pain of left shoulder  M25.512   2. Diaphoresis  R61 DG Chest Port 1 View    DG Chest Port 1 View    ED Discharge Orders         Ordered    cyclobenzaprine (FLEXERIL) 10 MG tablet  2 times daily PRN     08/04/18 1628  Maudie Flakes, MD 08/04/18 1630

## 2018-08-04 NOTE — ED Triage Notes (Signed)
Neck pain , back pain, and left arm pain  X 2-3 days  Pain rads down left arm
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
## 2018-08-04 NOTE — Discharge Instructions (Signed)
You were evaluated in the Emergency Department and after careful evaluation, we did not find any emergent condition requiring admission or further testing in the hospital.  Your symptoms today seem to be due to muscle strain or spasm.  Your testing today was reassuring.  Please return to the Emergency Department if you experience any worsening of your condition.  We encourage you to follow up with a primary care provider.  Thank you for allowing [place] to be a part of your care.

## 2018-08-04 NOTE — ED Notes (Signed)
Daughter- Andrea Mullen [phone]

## 2018-08-04 NOTE — ED Notes (Signed)
Meal given to pt.

## 2018-10-06 ENCOUNTER — Other Ambulatory Visit: Payer: Self-pay | Admitting: Physician Assistant

## 2018-10-06 DIAGNOSIS — Z1231 Encounter for screening mammogram for malignant neoplasm of breast: Secondary | ICD-10-CM

## 2018-10-12 ENCOUNTER — Encounter: Payer: Self-pay | Admitting: *Deleted

## 2018-11-11 ENCOUNTER — Encounter: Payer: Medicaid Other | Admitting: Obstetrics and Gynecology

## 2018-11-22 ENCOUNTER — Ambulatory Visit
Admission: RE | Admit: 2018-11-22 | Discharge: 2018-11-22 | Disposition: A | Discharge status: Home / Self Care | Payer: Medicaid Other | Source: Ambulatory Visit | Attending: Physician Assistant | Admitting: Physician Assistant

## 2018-11-22 ENCOUNTER — Other Ambulatory Visit: Payer: Self-pay

## 2018-11-22 DIAGNOSIS — Z1231 Encounter for screening mammogram for malignant neoplasm of breast: Secondary | ICD-10-CM

## 2019-07-21 ENCOUNTER — Emergency Department (HOSPITAL_COMMUNITY)
Admission: EM | Admit: 2019-07-21 | Discharge: 2019-07-21 | Disposition: A | Discharge status: Home / Self Care | Payer: Medicaid Other | Attending: Emergency Medicine | Admitting: Emergency Medicine

## 2019-07-21 ENCOUNTER — Encounter (HOSPITAL_COMMUNITY): Payer: Self-pay

## 2019-07-21 DIAGNOSIS — Z5321 Procedure and treatment not carried out due to patient leaving prior to being seen by health care provider: Secondary | ICD-10-CM | POA: Diagnosis not present

## 2019-07-21 DIAGNOSIS — R519 Headache, unspecified: Secondary | ICD-10-CM | POA: Insufficient documentation

## 2019-07-21 DIAGNOSIS — M545 Low back pain: Secondary | ICD-10-CM | POA: Diagnosis not present
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
## 2019-07-22 ENCOUNTER — Encounter (HOSPITAL_COMMUNITY): Payer: Self-pay | Admitting: *Deleted

## 2019-07-22 ENCOUNTER — Ambulatory Visit (HOSPITAL_COMMUNITY)
Admission: EM | Admit: 2019-07-22 | Discharge: 2019-07-22 | Disposition: A | Discharge status: Home / Self Care | Payer: Medicaid Other | Attending: Emergency Medicine | Admitting: Emergency Medicine

## 2019-07-22 ENCOUNTER — Ambulatory Visit (INDEPENDENT_AMBULATORY_CARE_PROVIDER_SITE_OTHER): Payer: Medicaid Other

## 2019-07-22 DIAGNOSIS — M545 Low back pain, unspecified: Secondary | ICD-10-CM

## 2019-07-22 DIAGNOSIS — S8002XA Contusion of left knee, initial encounter: Secondary | ICD-10-CM | POA: Diagnosis not present

## 2019-07-22 DIAGNOSIS — S060X0A Concussion without loss of consciousness, initial encounter: Secondary | ICD-10-CM

## 2019-07-22 DIAGNOSIS — M25562 Pain in left knee: Secondary | ICD-10-CM | POA: Diagnosis not present

## 2019-07-22 MED ORDER — IBUPROFEN 600 MG PO TABS: 600.0000 mg | ORAL_TABLET | Freq: Four times a day (QID) | ORAL | 0 refills | Status: DC | PRN

## 2019-07-22 MED ORDER — CYCLOBENZAPRINE HCL 5 MG PO TABS: 5.0000 mg | ORAL_TABLET | Freq: Two times a day (BID) | ORAL | 0 refills | Status: DC | PRN

## 2019-07-22 MED ORDER — ONDANSETRON 4 MG PO TBDP: 4.0000 mg | ORAL_TABLET | Freq: Three times a day (TID) | ORAL | 0 refills | Status: DC | PRN

## 2019-07-22 MED ORDER — HYDROXYZINE HCL 25 MG PO TABS: 25.0000 mg | ORAL_TABLET | Freq: Four times a day (QID) | ORAL | 0 refills | Status: DC

## 2019-07-22 NOTE — Discharge Instructions (Signed)
Use anti-inflammatories for pain/swelling. You may take up to 600 mg Ibuprofen every 8 hours with food. You may supplement Ibuprofen with Tylenol [phone] mg every 8 hours.   You may use flexeril as needed to help with pain. This is a muscle relaxer and causes sedation- please use only at bedtime or when you will be home and not have to drive/work  Ice and elevate knee  Follow up in emergency room for scan if headache and symptoms worsening
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
## 2019-11-16 ENCOUNTER — Other Ambulatory Visit: Payer: Self-pay | Admitting: Surgery

## 2019-11-16 DIAGNOSIS — K432 Incisional hernia without obstruction or gangrene: Secondary | ICD-10-CM

## 2019-12-04 ENCOUNTER — Other Ambulatory Visit: Payer: Self-pay

## 2019-12-04 ENCOUNTER — Encounter (INDEPENDENT_AMBULATORY_CARE_PROVIDER_SITE_OTHER): Payer: Self-pay

## 2019-12-04 ENCOUNTER — Ambulatory Visit
Admission: RE | Admit: 2019-12-04 | Discharge: 2019-12-04 | Disposition: A | Discharge status: Home / Self Care | Payer: Medicaid Other | Source: Ambulatory Visit | Attending: Surgery | Admitting: Surgery

## 2019-12-04 DIAGNOSIS — K432 Incisional hernia without obstruction or gangrene: Secondary | ICD-10-CM

## 2019-12-04 MED ORDER — IOPAMIDOL (ISOVUE-300) INJECTION 61%
100.0000 mL | Freq: Once | INTRAVENOUS | Status: AC | PRN
  Administered 2019-12-04: 100 mL via INTRAVENOUS

## 2020-04-20 ENCOUNTER — Ambulatory Visit (HOSPITAL_COMMUNITY)
Admission: EM | Admit: 2020-04-20 | Discharge: 2020-04-20 | Disposition: A | Discharge status: Home / Self Care | Payer: Medicaid Other | Attending: Emergency Medicine | Admitting: Emergency Medicine

## 2020-04-20 ENCOUNTER — Encounter (HOSPITAL_COMMUNITY): Payer: Self-pay | Admitting: *Deleted

## 2020-04-20 ENCOUNTER — Other Ambulatory Visit: Payer: Self-pay

## 2020-04-20 ENCOUNTER — Ambulatory Visit (INDEPENDENT_AMBULATORY_CARE_PROVIDER_SITE_OTHER): Payer: Medicaid Other

## 2020-04-20 DIAGNOSIS — R059 Cough, unspecified: Secondary | ICD-10-CM | POA: Diagnosis not present

## 2020-04-20 DIAGNOSIS — J069 Acute upper respiratory infection, unspecified: Secondary | ICD-10-CM | POA: Diagnosis not present

## 2020-04-20 DIAGNOSIS — R058 Other specified cough: Secondary | ICD-10-CM | POA: Diagnosis not present

## 2020-04-20 HISTORY — DX: Essential (primary) hypertension: I10

## 2020-04-20 MED ORDER — PROMETHAZINE-DM 6.25-15 MG/5ML PO SYRP: 5.0000 mL | ORAL_SOLUTION | Freq: Four times a day (QID) | ORAL | 0 refills | Status: DC | PRN

## 2020-04-20 NOTE — ED Triage Notes (Addendum)
C/O productive cough with green sputum x approx 1.5 wks.  Denies runny nose or congestion.  C/O HA with cough.  States chest pain only occurs with coughing, otherwise none. States had negative Covid test 2-3 days ago.  Denies fevers.

## 2020-04-20 NOTE — Discharge Instructions (Signed)
Take the cough medicine as needed for cough; do not drive, operate machinery, or drink alcohol with this medication as it will cause drowsiness.    Your chest x-ray is normal.  Follow up with your primary care provider if your symptoms are not improving.

## 2020-04-20 NOTE — ED Provider Notes (Signed)
[place]    CSN: [id number] Arrival date & time: 04/20/20  1052      History   Chief Complaint Chief Complaint  Patient presents with  . Cough    HPI Andrea Mullen is a 55 y.o. female.   Patient presents with 2-week history of cough productive of green phlegm.  She reports cough is causing a headache and sore throat.  The cough is worse at night and disturbing her sleep.  She denies fever, chills, rhinorrhea, congestion, shortness of breath, or other symptoms.  She reports having a negative Covid test 2 days ago.  Her medical history includes NSTEMI, tobacco abuse, morbid obesity, polysubstance abuse, anxiety, lung cancer.  The history is provided by the patient and medical records.    Past Medical History:  Diagnosis Date  . Anxiety   . Asthma   . Cancer ([place])   . Hypertension     Patient Active Problem List   Diagnosis Date Noted  . Elevated troponin   . NSTEMI (non-ST elevated myocardial infarction) ([place])   . Polysubstance abuse ([place])   . Anxiety state   . Atypical chest pain 10/30/2014  . Smoker 10/30/2014  . Chest pain 10/30/2014  . Homicidal ideation   . MALIGNANT NEOPLASM BRONCHUS&LUNG UNSPEC SITE 07/11/2007  . Morbid obesity ([place]) 07/11/2007  . TOBACCO ABUSE-HISTORY OF 07/11/2007  . PULMONARY NODULE 06/07/2007    Past Surgical History:  Procedure Laterality Date  . CARDIAC CATHETERIZATION N/A 11/01/2014   Procedure: Left Heart Cath and Coronary Angiography;  Surgeon: Belva Crome, MD;  Location: [place] [facility];  Service: Cardiovascular;  Laterality: N/A;  . CESAREAN SECTION     x3  . HERNIA REPAIR      OB History   No obstetric history on file.      Home Medications    Prior to Admission medications   Medication Sig Start Date End Date Taking? Authorizing Provider  promethazine-dextromethorphan (PROMETHAZINE-DM) 6.25-15 MG/5ML syrup Take 5 mLs by mouth 4 (four) times daily as needed for cough. 04/20/20  Yes Sharion Balloon, NP   acetaminophen (TYLENOL) 500 MG tablet Take 1 tablet (500 mg total) by mouth every 6 (six) hours as needed. 05/20/17   Valarie Merino, MD  aspirin EC 81 MG tablet Take 1 tablet (81 mg total) by mouth daily. Patient not taking: No sig reported 11/01/14   Thurnell Lose, MD  Aspirin-Acetaminophen-Caffeine (MIGRAINE FORMULA PO) Take 1 tablet by mouth every 6 (six) hours as needed (migraine).    [provider]  atorvastatin (LIPITOR) 40 MG tablet Take 1 tablet (40 mg total) by mouth daily. Patient not taking: No sig reported 11/01/14   Thurnell Lose, MD  cyclobenzaprine (FLEXERIL) 5 MG tablet Take 1-2 tablets (5-10 mg total) by mouth 2 (two) times daily as needed for muscle spasms. 07/22/19   Wieters, Hallie C, PA-C  diltiazem (CARDIZEM) 60 MG tablet Take 1 tablet (60 mg total) by mouth 3 (three) times daily. Please discontinue the previous Coreg prescription Patient not taking: No sig reported 11/01/14   Thurnell Lose, MD  HYDROcodone-acetaminophen (NORCO) 5-325 MG tablet Take 1 tablet by mouth every 6 (six) hours as needed for moderate pain. Patient not taking: No sig reported 12/12/16   Jeannett Senior, PA-C  hydrOXYzine (ATARAX/VISTARIL) 25 MG tablet Take 1 tablet (25 mg total) by mouth every 6 (six) hours. 07/22/19   Wieters, Hallie C, PA-C  ibuprofen (ADVIL) 600 MG tablet Take 1 tablet (  600 mg total) by mouth every 6 (six) hours as needed. 07/22/19   Wieters, Hallie C, PA-C  methocarbamol (ROBAXIN) 500 MG tablet Take 1 tablet (500 mg total) by mouth 2 (two) times daily. Patient not taking: No sig reported 12/12/16   Jeannett Senior, PA-C  nitroGLYCERIN (NITROSTAT) 0.4 MG SL tablet Place 1 tablet (0.4 mg total) under the tongue every 5 (five) minutes as needed for chest pain. Patient not taking: Reported on 08/04/2018 11/01/14   Thurnell Lose, MD  ondansetron (ZOFRAN ODT) 4 MG disintegrating tablet Take 1 tablet (4 mg total) by mouth every 8 (eight) hours as needed for  nausea or vomiting. 07/22/19   Wieters, Hallie C, PA-C  ondansetron (ZOFRAN) 4 MG tablet Take 1 tablet (4 mg total) by mouth every 8 (eight) hours as needed for nausea or vomiting. Patient not taking: No sig reported 05/20/17   Valarie Merino, MD    Family History Family History  Problem Relation Age of Onset  . Clotting disorder Mother     Social History Social History   Tobacco Use  . Smoking status: Current Every Day Smoker    Packs/day: 0.50    Types: Cigarettes  . Smokeless tobacco: Never Used  Vaping Use  . Vaping Use: Never used  Substance Use Topics  . Alcohol use: Not Currently    Comment: Sober x 1 month  . Drug use: Not Currently    Types: Marijuana, Cocaine    Comment: none x approx 1 month     Allergies   Prednisone, Bicillin c-r, Ibuprofen, Nsaids, and Penicillins   Review of Systems Review of Systems  Constitutional: Negative for chills and fever.  HENT: Positive for sore throat. Negative for ear pain.   Eyes: Negative for pain and visual disturbance.  Respiratory: Positive for cough. Negative for shortness of breath.   Cardiovascular: Negative for chest pain and palpitations.  Gastrointestinal: Negative for abdominal pain and vomiting.  Genitourinary: Negative for dysuria and hematuria.  Musculoskeletal: Negative for arthralgias and back pain.  Skin: Negative for color change and rash.  Neurological: Positive for headaches. Negative for syncope, weakness and numbness.  All other systems reviewed and are negative.    Physical Exam Triage Vital Signs ED Triage Vitals  Enc Vitals Group     BP      Pulse      Resp      Temp      Temp src      SpO2      Weight      Height      Head Circumference      Peak Flow      Pain Score      Pain Loc      Pain Edu?      Excl. in [place]?    No data found.  Updated Vital Signs BP 135/70   Pulse 92   Temp 97.7 F (36.5 C) (Oral)   Resp (!) 22   SpO2 100%   Visual Acuity Right Eye Distance:    Left Eye Distance:   Bilateral Distance:    Right Eye Near:   Left Eye Near:    Bilateral Near:     Physical Exam Vitals and nursing note reviewed.  Constitutional:      General: She is not in acute distress.    Appearance: She is well-developed. She is obese. She is not ill-appearing.  HENT:     Head: Normocephalic and atraumatic.  Nose: Nose normal.     Mouth/Throat:     Mouth: Mucous membranes are moist.     Pharynx: Oropharynx is clear.  Eyes:     Conjunctiva/sclera: Conjunctivae normal.  Cardiovascular:     Rate and Rhythm: Normal rate and regular rhythm.     Heart sounds: Normal heart sounds. No murmur heard.   Pulmonary:     Effort: Pulmonary effort is normal. No respiratory distress.     Breath sounds: Normal breath sounds.  Abdominal:     Palpations: Abdomen is soft.     Tenderness: There is no abdominal tenderness.  Musculoskeletal:     Cervical back: Neck supple.  Skin:    General: Skin is warm and dry.  Neurological:     General: No focal deficit present.     Mental Status: She is alert and oriented to person, place, and time.     Gait: Gait normal.  Psychiatric:        Mood and Affect: Mood normal.        Behavior: Behavior normal.      UC Treatments / Results  Labs (all labs ordered are listed, but only abnormal results are displayed) Labs Reviewed - No data to display  EKG   Radiology DG Chest 2 View  Result Date: 04/20/2020 CLINICAL DATA:  Productive cough for 2 weeks. EXAM: CHEST - 2 VIEW COMPARISON:  August 04, 2018 FINDINGS: The heart size and mediastinal contours are within normal limits. Both lungs are clear. The visualized skeletal structures are unremarkable. IMPRESSION: No active cardiopulmonary disease. Electronically Signed   By: Dorise Bullion III M.D   On: 04/20/2020 12:44    Procedures Procedures (including critical care time)  Medications Ordered in UC Medications - No data to display  Initial Impression / Assessment  and Plan / UC Course  I have reviewed the triage vital signs and the nursing notes.  Pertinent labs & imaging results that were available during my care of the patient were reviewed by me and considered in my medical decision making (see chart for details).   Cough, viral URI.  Patient declines COVID test today.  Chest x-ray normal.  Treating with symptomatic relief including promethazine-dextromethorphan cough syrup; precautions for drowsiness with this medication discussed.  Antibiotic stewardship discussed.  Instructed patient to follow-up with her PCP if her symptoms are not improving.  She agrees to plan of care.   Final Clinical Impressions(s) / UC Diagnoses   Final diagnoses:  Cough  Viral upper respiratory tract infection     Discharge Instructions     Take the cough medicine as needed for cough; do not drive, operate machinery, or drink alcohol with this medication as it will cause drowsiness.    Your chest x-ray is normal.  Follow up with your primary care provider if your symptoms are not improving.         ED Prescriptions    Medication Sig Dispense Auth. Provider   promethazine-dextromethorphan (PROMETHAZINE-DM) 6.25-15 MG/5ML syrup Take 5 mLs by mouth 4 (four) times daily as needed for cough. 100 mL Sharion Balloon, NP     PDMP not reviewed this encounter.   Sharion Balloon, NP 04/20/20 1304

## 2020-07-16 ENCOUNTER — Ambulatory Visit (INDEPENDENT_AMBULATORY_CARE_PROVIDER_SITE_OTHER): Payer: Medicaid Other

## 2020-07-16 ENCOUNTER — Other Ambulatory Visit: Payer: Self-pay | Admitting: Podiatry

## 2020-07-16 ENCOUNTER — Encounter: Payer: Self-pay | Admitting: Podiatry

## 2020-07-16 ENCOUNTER — Ambulatory Visit (INDEPENDENT_AMBULATORY_CARE_PROVIDER_SITE_OTHER): Payer: Medicaid Other | Admitting: Podiatry

## 2020-07-16 ENCOUNTER — Other Ambulatory Visit: Payer: Self-pay

## 2020-07-16 DIAGNOSIS — M7662 Achilles tendinitis, left leg: Secondary | ICD-10-CM

## 2020-07-16 DIAGNOSIS — M216X1 Other acquired deformities of right foot: Secondary | ICD-10-CM | POA: Diagnosis not present

## 2020-07-16 DIAGNOSIS — M2012 Hallux valgus (acquired), left foot: Secondary | ICD-10-CM | POA: Diagnosis not present

## 2020-07-16 DIAGNOSIS — M2011 Hallux valgus (acquired), right foot: Secondary | ICD-10-CM | POA: Diagnosis not present

## 2020-07-16 DIAGNOSIS — M79672 Pain in left foot: Secondary | ICD-10-CM

## 2020-07-16 DIAGNOSIS — G8929 Other chronic pain: Secondary | ICD-10-CM

## 2020-07-16 DIAGNOSIS — M2060 Acquired deformities of toe(s), unspecified, unspecified foot: Secondary | ICD-10-CM

## 2020-07-16 DIAGNOSIS — M216X2 Other acquired deformities of left foot: Secondary | ICD-10-CM

## 2020-07-16 DIAGNOSIS — M2061 Acquired deformities of toe(s), unspecified, right foot: Secondary | ICD-10-CM

## 2020-07-16 MED ORDER — CICLOPIROX 8 % EX SOLN: Freq: Every day | CUTANEOUS | 0 refills | Status: DC

## 2020-07-16 NOTE — Patient Instructions (Addendum)
Achilles Tendinitis  with Rehab Achilles tendinitis is a disorder of the Achilles tendon. The Achilles tendon connects the large calf muscles (Gastrocnemius and Soleus) to the heel bone (calcaneus). This tendon is sometimes called the heel cord. It is important for pushing-off and standing on your toes and is important for walking, running, or jumping. Tendinitis is often caused by overuse and repetitive microtrauma. SYMPTOMS  Pain, tenderness, swelling, warmth, and redness may occur over the Achilles tendon even at rest.  Pain with pushing off, or flexing or extending the ankle.  Pain that is worsened after or during activity. CAUSES   Overuse sometimes seen with rapid increase in exercise programs or in sports requiring running and jumping.  Poor physical conditioning (strength and flexibility or endurance).  Running sports, especially training running down hills.  Inadequate warm-up before practice or play or failure to stretch before participation.  Injury to the tendon. PREVENTION   Warm up and stretch before practice or competition.  Allow time for adequate rest and recovery between practices and competition.  Keep up conditioning.  Keep up ankle and leg flexibility.  Improve or keep muscle strength and endurance.  Improve cardiovascular fitness.  Use proper technique.  Use proper equipment (shoes, skates).  To help prevent recurrence, taping, protective strapping, or an adhesive bandage may be recommended for several weeks after healing is complete. PROGNOSIS   Recovery may take weeks to several months to heal.  Longer recovery is expected if symptoms have been prolonged.  Recovery is usually quicker if the inflammation is due to a direct blow as compared with overuse or sudden strain. RELATED COMPLICATIONS   Healing time will be prolonged if the condition is not correctly treated. The injury must be given plenty of time to heal.  Symptoms can reoccur if  activity is resumed too soon.  Untreated, tendinitis may increase the risk of tendon rupture requiring additional time for recovery and possibly surgery. TREATMENT   The first treatment consists of rest anti-inflammatory medication, and ice to relieve the pain.  Stretching and strengthening exercises after resolution of pain will likely help reduce the risk of recurrence. Referral to a physical therapist or athletic trainer for further evaluation and treatment may be helpful.  A walking boot or cast may be recommended to rest the Achilles tendon. This can help break the cycle of inflammation and microtrauma.  Arch supports (orthotics) may be prescribed or recommended by your caregiver as an adjunct to therapy and rest.  Surgery to remove the inflamed tendon lining or degenerated tendon tissue is rarely necessary and has shown less than predictable results. MEDICATION   Nonsteroidal anti-inflammatory medications, such as aspirin and ibuprofen, may be used for pain and inflammation relief. Do not take within 7 days before surgery. Take these as directed by your caregiver. Contact your caregiver immediately if any bleeding, stomach upset, or signs of allergic reaction occur. Other minor pain relievers, such as acetaminophen, may also be used.  Pain relievers may be prescribed as necessary by your caregiver. Do not take prescription pain medication for longer than 4 to 7 days. Use only as directed and only as much as you need.  Cortisone injections are rarely indicated. Cortisone injections may weaken tendons and predispose to rupture. It is better to give the condition more time to heal than to use them. HEAT AND COLD  Cold is used to relieve pain and reduce inflammation for acute and chronic Achilles tendinitis. Cold should be applied for 10 to 15 minutes  every 2 to 3 hours for inflammation and pain and immediately after any activity that aggravates your symptoms. Use ice packs or an ice  massage.  Heat may be used before performing stretching and strengthening activities prescribed by your caregiver. Use a heat pack or a warm soak. SEEK MEDICAL CARE IF:  Symptoms get worse or do not improve in 2 weeks despite treatment.  New, unexplained symptoms develop. Drugs used in treatment may produce side effects.  EXERCISES:  RANGE OF MOTION (ROM) AND STRETCHING EXERCISES - Achilles Tendinitis  These exercises may help you when beginning to rehabilitate your injury. Your symptoms may resolve with or without further involvement from your physician, physical therapist or athletic trainer. While completing these exercises, remember:   Restoring tissue flexibility helps normal motion to return to the joints. This allows healthier, less painful movement and activity.  An effective stretch should be held for at least 30 seconds.  A stretch should never be painful. You should only feel a gentle lengthening or release in the stretched tissue.  STRETCH  Gastroc, Standing   Place hands on wall.  Extend right / left leg, keeping the front knee somewhat bent.  Slightly point your toes inward on your back foot.  Keeping your right / left heel on the floor and your knee straight, shift your weight toward the wall, not allowing your back to arch.  You should feel a gentle stretch in the right / left calf. Hold this position for 10 seconds. Repeat 3 times. Complete this stretch 2 times per day.  STRETCH  Soleus, Standing   Place hands on wall.  Extend right / left leg, keeping the other knee somewhat bent.  Slightly point your toes inward on your back foot.  Keep your right / left heel on the floor, bend your back knee, and slightly shift your weight over the back leg so that you feel a gentle stretch deep in your back calf.  Hold this position for 10 seconds. Repeat 3 times. Complete this stretch 2 times per day.  STRETCH  Gastrocsoleus, Standing  Note: This exercise can place  a lot of stress on your foot and ankle. Please complete this exercise only if specifically instructed by your caregiver.   Place the ball of your right / left foot on a step, keeping your other foot firmly on the same step.  Hold on to the wall or a rail for balance.  Slowly lift your other foot, allowing your body weight to press your heel down over the edge of the step.  You should feel a stretch in your right / left calf.  Hold this position for 10 seconds.  Repeat this exercise with a slight bend in your knee. Repeat 3 times. Complete this stretch 2 times per day.   STRENGTHENING EXERCISES - Achilles Tendinitis These exercises may help you when beginning to rehabilitate your injury. They may resolve your symptoms with or without further involvement from your physician, physical therapist or athletic trainer. While completing these exercises, remember:   Muscles can gain both the endurance and the strength needed for everyday activities through controlled exercises.  Complete these exercises as instructed by your physician, physical therapist or athletic trainer. Progress the resistance and repetitions only as guided.  You may experience muscle soreness or fatigue, but the pain or discomfort you are trying to eliminate should never worsen during these exercises. If this pain does worsen, stop and make certain you are following the directions exactly. If  the pain is still present after adjustments, discontinue the exercise until you can discuss the trouble with your clinician.  STRENGTH - Plantar-flexors   Sit with your right / left leg extended. Holding onto both ends of a rubber exercise band/tubing, loop it around the ball of your foot. Keep a slight tension in the band.  Slowly push your toes away from you, pointing them downward.  Hold this position for 10 seconds. Return slowly, controlling the tension in the band/tubing. Repeat 3 times. Complete this exercise 2 times per day.    STRENGTH - Plantar-flexors   Stand with your feet shoulder width apart. Steady yourself with a wall or table using as little support as needed.  Keeping your weight evenly spread over the width of your feet, rise up on your toes.*  Hold this position for 10 seconds. Repeat 3 times. Complete this exercise 2 times per day.  *If this is too easy, shift your weight toward your right / left leg until you feel challenged. Ultimately, you may be asked to do this exercise with your right / left foot only.  STRENGTH  Plantar-flexors, Eccentric  Note: This exercise can place a lot of stress on your foot and ankle. Please complete this exercise only if specifically instructed by your caregiver.   Place the balls of your feet on a step. With your hands, use only enough support from a wall or rail to keep your balance.  Keep your knees straight and rise up on your toes.  Slowly shift your weight entirely to your right / left toes and pick up your opposite foot. Gently and with controlled movement, lower your weight through your right / left foot so that your heel drops below the level of the step. You will feel a slight stretch in the back of your calf at the end position.  Use the healthy leg to help rise up onto the balls of both feet, then lower weight only on the right / left leg again. Build up to 15 repetitions. Then progress to 3 consecutive sets of 15 repetitions.*  After completing the above exercise, complete the same exercise with a slight knee bend (about 30 degrees). Again, build up to 15 repetitions. Then progress to 3 consecutive sets of 15 repetitions.* Perform this exercise 2 times per day.  *When you easily complete 3 sets of 15, your physician, physical therapist or athletic trainer may advise you to add resistance by wearing a backpack filled with additional weight.  STRENGTH - Plantar Flexors, Seated   Sit on a chair that allows your feet to rest flat on the ground. If  necessary, sit at the edge of the chair.  Keeping your toes firmly on the ground, lift your right / left heel as far as you can without increasing any discomfort in your ankle. Repeat 3 times. Complete this exercise 2 times a day.    Bunion A bunion (hallux valgus) is a bump that forms slowly on the inner side of the big toe joint. It occurs when the big toe turns toward the second toe. Bunions may be small at first, but they often get larger over time. They can make walking painful. What are the causes? This condition may be caused by:  Wearing narrow or pointed shoes that force the big toe to press against the other toes.  Abnormal foot development that causes the foot to roll inward.  Changes in the foot that are caused by certain diseases, such as rheumatoid  arthritis or polio.  A foot injury. What increases the risk? The following factors may make you more likely to develop this condition:  Wearing shoes that squeeze the toes together.  Having certain diseases, such as: ? Rheumatoid arthritis. ? Polio. ? Cerebral palsy.  Having family members who have bunions.  Being born with abnormally shaped feet (a foot deformity), such as flat feet or low arches.  Doing activities that put a lot of pressure on the feet, such as ballet dancing. What are the signs or symptoms? The main symptom of this condition is a bump on your big toe that you can notice. Other symptoms may include:  Pain.  Redness and inflammation around your big toe.  Thick or hardened skin on your big toe or between your toes.  Stiffness or loss of motion in your big toe.  Trouble with walking.   How is this diagnosed? This condition may be diagnosed based on your symptoms, medical history, and activities. You may also have tests and imaging, such as:  X-rays. These allow your health care provider to check the position of the bones in your foot and look for damage to your joint. They also help your  health care provider determine the severity of your bunion and the best way to treat it.  Joint aspiration. In this test, a sample of fluid is removed from the toe joint. This test may be done if you are in a lot of pain. It helps rule out diseases that cause painful swelling of the joints, such as arthritis or gout. How is this treated? Treatment depends on the severity of your symptoms. The goal of treatment is to relieve symptoms and prevent your bunion from getting worse. Your health care provider may recommend:  Wearing shoes that have a wide toe box, or using bunion pads to cushion the affected area.  Taping your toes together to keep them in a normal position.  Placing a device inside your shoe (orthotic device) to help reduce pressure on your toe joint.  Taking medicine to ease pain and inflammation.  Putting ice or heat on the affected area.  Doing stretching exercises.  Surgery, for severe cases. Follow these instructions at home: Managing pain, stiffness, and swelling  If directed, put ice on the painful area. To do this: ? Put ice in a plastic bag. ? Place a towel between your skin and the bag. ? Leave the ice on for 20 minutes, 2-3 times a day. ? Remove the ice if your skin turns bright red. This is very important. If you cannot feel pain, heat, or cold, you have a greater risk of damage to the area.  If directed, apply heat to the affected area before you exercise. Use the heat source that your health care provider recommends, such as a moist heat pack or a heating pad. ? Place a towel between your skin and the heat source. ? Leave the heat on for 20-30 minutes. ? Remove the heat if your skin turns bright red. This is especially important if you are unable to feel pain, heat, or cold. You have a greater risk of getting burned.      General instructions  Do exercises as told by your health care provider.  Support your toe joint with proper footwear, shoe padding, or  taping as told by your health care provider.  Take over-the-counter and prescription medicines only as told by your health care provider.  Do not use any products that contain  nicotine or tobacco, such as cigarettes, e-cigarettes, and chewing tobacco. If you need help quitting, ask your health care provider.  Keep all follow-up visits. This is important. Contact a health care provider if:  Your symptoms get worse.  Your symptoms do not improve in 2 weeks. Get help right away if:  You have severe pain and trouble with walking. Summary  A bunion is a bump on the inner side of the big toe joint that forms when the big toe turns toward the second toe.  Bunions can make walking painful.  Treatment depends on the severity of your symptoms.  Support your toe joint with proper footwear, shoe padding, or taping as told by your health care provider. This information is not intended to replace advice given to you by your health care provider. Make sure you discuss any questions you have with your health care provider. Document Revised: 06/02/2019 Document Reviewed: 06/02/2019 Elsevier Patient Education  2021 [employer].

## 2020-07-21 NOTE — Progress Notes (Signed)
Subjective:   Patient ID: Andrea Mullen, female   DOB: 55 y.o.   MRN: [id number]   HPI 55 year old female presents the office with multiple concerns.  Her main issue is that she is been discomfort in her left heel putting the Achilles tendon which is been on for the last 2 weeks.  She denies any recent injury or trauma when the symptoms started.  Gradually getting worse.  Also she has a bunion on both feet that cause discomfort.  Hurts with shoes.  She is diabetic but she does not know her last A1c or blood sugar.  She is asking the nails be trimmed today as are thickened elongated she cannot do them herself.  Of note during the visit she states that she did fall at [employer] about a month ago and had pain to her Achilles started after this but it was not immediately.   Review of Systems  All other systems reviewed and are negative.  Past Medical History:  Diagnosis Date   Anxiety    Asthma    Cancer ([place])    Hypertension     Past Surgical History:  Procedure Laterality Date   CARDIAC CATHETERIZATION N/A 11/01/2014   Procedure: Left Heart Cath and Coronary Angiography;  Surgeon: Belva Crome, MD;  Location: [facility];  Service: Cardiovascular;  Laterality: N/A;   CESAREAN SECTION     x3   HERNIA REPAIR       Current Outpatient Medications:    ciclopirox (PENLAC) 8 % solution, Apply topically at bedtime. Apply over nail and surrounding skin. Apply daily over previous coat. After seven (7) days, may remove with alcohol and continue cycle., Disp: 6.6 mL, Rfl: 0   acetaminophen (TYLENOL) 500 MG tablet, Take 1 tablet (500 mg total) by mouth every 6 (six) hours as needed., Disp: 30 tablet, Rfl: 0   aspirin EC 81 MG tablet, Take 1 tablet (81 mg total) by mouth daily. (Patient not taking: No sig reported), Disp: 30 tablet, Rfl: 0   Aspirin-Acetaminophen-Caffeine (MIGRAINE FORMULA PO), Take 1 tablet by mouth every 6 (six) hours as needed (migraine)., Disp: , Rfl:    atorvastatin  (LIPITOR) 40 MG tablet, Take 1 tablet (40 mg total) by mouth daily. (Patient not taking: No sig reported), Disp: 30 tablet, Rfl: 0   cyclobenzaprine (FLEXERIL) 5 MG tablet, Take 1-2 tablets (5-10 mg total) by mouth 2 (two) times daily as needed for muscle spasms., Disp: 24 tablet, Rfl: 0   diltiazem (CARDIZEM) 60 MG tablet, Take 1 tablet (60 mg total) by mouth 3 (three) times daily. Please discontinue the previous Coreg prescription (Patient not taking: No sig reported), Disp: 90 tablet, Rfl: 0   HYDROcodone-acetaminophen (NORCO) 5-325 MG tablet, Take 1 tablet by mouth every 6 (six) hours as needed for moderate pain. (Patient not taking: No sig reported), Disp: 10 tablet, Rfl: 0   hydrOXYzine (ATARAX/VISTARIL) 25 MG tablet, Take 1 tablet (25 mg total) by mouth every 6 (six) hours., Disp: 20 tablet, Rfl: 0   ibuprofen (ADVIL) 600 MG tablet, Take 1 tablet (600 mg total) by mouth every 6 (six) hours as needed., Disp: 30 tablet, Rfl: 0   meloxicam (MOBIC) 15 MG tablet, Take 1 tablet by mouth daily., Disp: , Rfl:    methocarbamol (ROBAXIN) 500 MG tablet, Take 1 tablet (500 mg total) by mouth 2 (two) times daily. (Patient not taking: No sig reported), Disp: 12 tablet, Rfl: 0   nitroGLYCERIN (NITROSTAT) 0.4 MG SL tablet,  Place 1 tablet (0.4 mg total) under the tongue every 5 (five) minutes as needed for chest pain. (Patient not taking: Reported on 08/04/2018), Disp: 20 tablet, Rfl: 12   ondansetron (ZOFRAN ODT) 4 MG disintegrating tablet, Take 1 tablet (4 mg total) by mouth every 8 (eight) hours as needed for nausea or vomiting., Disp: 20 tablet, Rfl: 0   ondansetron (ZOFRAN) 4 MG tablet, Take 1 tablet (4 mg total) by mouth every 8 (eight) hours as needed for nausea or vomiting. (Patient not taking: No sig reported), Disp: 4 tablet, Rfl: 0   promethazine-dextromethorphan (PROMETHAZINE-DM) 6.25-15 MG/5ML syrup, Take 5 mLs by mouth 4 (four) times daily as needed for cough., Disp: 100 mL, Rfl: 0   traMADol (ULTRAM)  50 MG tablet, Take 50 mg by mouth 3 (three) times daily as needed., Disp: , Rfl:   Allergies  Allergen Reactions   Prednisone Anaphylaxis    seizure   Bicillin C-R Hives   Ibuprofen Nausea Only   Nsaids Other (See Comments)    Hives    Penicillins Hives    Has patient had a PCN reaction causing immediate rash, facial/tongue/throat swelling, SOB or lightheadedness with hypotension: No Has patient had a PCN reaction causing severe rash involving mucus membranes or skin necrosis: Yes Has patient had a PCN reaction that required hospitalization No Has patient had a PCN reaction occurring within the last 10 years: Yes  If all of the above answers are "NO", then may proceed with Cephalosporin use.           Objective:  Physical Exam  General: AAO x3, NAD  Dermatological: Nails are hypertrophic, dystrophic, brittle, discolored, elongated 10. No surrounding redness or drainage. Tenderness nails 1-5 bilaterally. No open lesions or pre-ulcerative lesions are identified today.  Vascular: Dorsalis Pedis artery and Posterior Tibial artery pedal pulses are 2/4 bilateral with immedate capillary fill time. There is no pain with calf compression, swelling, warmth, erythema.   Neruologic: Grossly intact via light touch bilateral.  Sensation intact with Semmes Weinstein monofilament  Musculoskeletal: Moderate bunions are present bilaterally.  No significant tenderness palpation today on exam but mostly tender with shoes.  No crepitation or restriction with first IPJ range of motion.  No hypermobility of the first ray.  There is tenderness on the distal portion Achilles tendon along the mid substance and also towards the insertion into the calcaneus.  No pain with all compression of calcaneus.  There is minimal edema.  There is no erythema or warmth.  Flexor, extensor tendons.  Intact.  Thompson test negative.  Muscular strength 5/5 in all groups tested bilateral.  Gait: Unassisted, Nonantalgic.        Assessment:   Achilles tendinitis left side; bilateral bunions; symptomatic onychomycosis     Plan:  -Treatment options discussed including all alternatives, risks, and complications -Etiology of symptoms were discussed -X-rays were obtained and reviewed with the patient.  No subacute fracture identified.  Bunions are evident.  Heel spurs present.  Achilles tendinitis left side - Discussed stretching, icing daily.  Dispensed a heel lift.  Discussed shoe modifications with arch supports.  Referral to physical therapy.  Symptoms continue MRI.   2. Bunions, bilateral  -Discussion modifications, offloading pads.  Offloading pads were dispensed.  If symptoms persist consider surgical intervention in the future if needed.   3. Symptomatic onychomycosis - Nails sharply debrided x2 without any complications or bleeding.  4.  Type 2 diabetes  -Discussed glucose control, daily foot inspection.  Bonna Gains  Jacqualyn Posey DPM

## 2020-08-02 ENCOUNTER — Ambulatory Visit: Payer: Medicaid Other | Attending: Podiatry

## 2020-10-17 ENCOUNTER — Ambulatory Visit: Payer: Medicaid Other | Admitting: Podiatry

## 2021-01-19 ENCOUNTER — Emergency Department (HOSPITAL_COMMUNITY): Payer: Medicaid Other

## 2021-01-19 ENCOUNTER — Other Ambulatory Visit: Payer: Self-pay

## 2021-01-19 ENCOUNTER — Emergency Department (HOSPITAL_COMMUNITY)
Admission: EM | Admit: 2021-01-19 | Discharge: 2021-01-19 | Disposition: A | Discharge status: Home / Self Care | Payer: Medicaid Other | Attending: Emergency Medicine | Admitting: Emergency Medicine

## 2021-01-19 ENCOUNTER — Encounter (HOSPITAL_COMMUNITY): Payer: Self-pay | Admitting: Emergency Medicine

## 2021-01-19 DIAGNOSIS — I10 Essential (primary) hypertension: Secondary | ICD-10-CM | POA: Insufficient documentation

## 2021-01-19 DIAGNOSIS — Z859 Personal history of malignant neoplasm, unspecified: Secondary | ICD-10-CM | POA: Diagnosis not present

## 2021-01-19 DIAGNOSIS — J45909 Unspecified asthma, uncomplicated: Secondary | ICD-10-CM | POA: Insufficient documentation

## 2021-01-19 DIAGNOSIS — R519 Headache, unspecified: Secondary | ICD-10-CM | POA: Insufficient documentation

## 2021-01-19 DIAGNOSIS — M25562 Pain in left knee: Secondary | ICD-10-CM | POA: Diagnosis present

## 2021-01-19 DIAGNOSIS — F17210 Nicotine dependence, cigarettes, uncomplicated: Secondary | ICD-10-CM | POA: Diagnosis not present

## 2021-01-19 DIAGNOSIS — Z7982 Long term (current) use of aspirin: Secondary | ICD-10-CM | POA: Diagnosis not present

## 2021-01-19 DIAGNOSIS — R2689 Other abnormalities of gait and mobility: Secondary | ICD-10-CM | POA: Diagnosis not present

## 2021-01-19 NOTE — ED Triage Notes (Signed)
Pt states she fell on steps 3-4 days ago due to a loose railing.  C/o numbness to L leg and R arm since fall and pain to L knee.  Pt unable to lift leg to put on feet of wheelchair.

## 2021-01-19 NOTE — Progress Notes (Signed)
Orthopedic Tech Progress Note Patient Details:  Andrea Mullen 07-19-1965 [id number]   Ortho Devices Type of Ortho Device: Knee Sleeve (hinged knee brace) Ortho Device/Splint Location: LLE Ortho Device/Splint Interventions: Ordered, Application   Post Interventions Patient Tolerated: Well Instructions Provided: Care of device, Adjustment of device  Andrea Mullen 01/19/2021, 4:32 PM

## 2021-01-19 NOTE — ED Provider Notes (Signed)
[facility] EMERGENCY DEPARTMENT Provider Note  CSN: [id number] Arrival date & time: 01/19/21 1448    History Chief Complaint  Patient presents with   Knee Pain    Andrea Mullen is a 55 y.o. female reports she fell about 4 days ago walking up some stairs, injuring her L knee. Did not have head injury or LOC. She has had continued pain and difficulty standing due to the pain. She was taking APAP with minimal improvement. She reports some tingling distal leg since the fall.   She also reports she has had headaches and feeling 'off-balance' for awhile. Several months at least. She spoke with her PCP about these symptoms and she reports she was told she needed to come to the ER for an MRI. She does not have any acute change in those symptoms in recent days.    Past Medical History:  Diagnosis Date   Anxiety    Asthma    Cancer ([place])    Hypertension     Past Surgical History:  Procedure Laterality Date   CARDIAC CATHETERIZATION N/A 11/01/2014   Procedure: Left Heart Cath and Coronary Angiography;  Surgeon: Belva Crome, MD;  Location: [facility];  Service: Cardiovascular;  Laterality: N/A;   CESAREAN SECTION     x3   HERNIA REPAIR      Family History  Problem Relation Age of Onset   Clotting disorder Mother     Social History   Tobacco Use   Smoking status: Every Day    Packs/day: 0.50    Types: Cigarettes   Smokeless tobacco: Never  Vaping Use   Vaping Use: Never used  Substance Use Topics   Alcohol use: Not Currently    Comment: Sober x 1 month   Drug use: Not Currently    Types: Marijuana, Cocaine    Comment: none x approx 1 month     Home Medications Prior to Admission medications   Medication Sig Start Date End Date Taking? Authorizing Provider  acetaminophen (TYLENOL) 500 MG tablet Take 1 tablet (500 mg total) by mouth every 6 (six) hours as needed. 05/20/17   Valarie Merino, MD  aspirin EC 81 MG tablet Take 1 tablet (81 mg total) by mouth  daily. Patient not taking: No sig reported 11/01/14   Thurnell Lose, MD  Aspirin-Acetaminophen-Caffeine (MIGRAINE FORMULA PO) Take 1 tablet by mouth every 6 (six) hours as needed (migraine).    [provider]  atorvastatin (LIPITOR) 40 MG tablet Take 1 tablet (40 mg total) by mouth daily. Patient not taking: No sig reported 11/01/14   Thurnell Lose, MD  ciclopirox [facility]) 8 % solution Apply topically at bedtime. Apply over nail and surrounding skin. Apply daily over previous coat. After seven (7) days, may remove with alcohol and continue cycle. 07/16/20   Trula Slade, DPM  cyclobenzaprine (FLEXERIL) 5 MG tablet Take 1-2 tablets (5-10 mg total) by mouth 2 (two) times daily as needed for muscle spasms. 07/22/19   Wieters, Hallie C, PA-C  diltiazem (CARDIZEM) 60 MG tablet Take 1 tablet (60 mg total) by mouth 3 (three) times daily. Please discontinue the previous Coreg prescription Patient not taking: No sig reported 11/01/14   Thurnell Lose, MD  HYDROcodone-acetaminophen (NORCO) 5-325 MG tablet Take 1 tablet by mouth every 6 (six) hours as needed for moderate pain. Patient not taking: No sig reported 12/12/16   Jeannett Senior, PA-C  hydrOXYzine (ATARAX/VISTARIL) 25 MG tablet Take 1 tablet (  25 mg total) by mouth every 6 (six) hours. 07/22/19   Wieters, Hallie C, PA-C  ibuprofen (ADVIL) 600 MG tablet Take 1 tablet (600 mg total) by mouth every 6 (six) hours as needed. 07/22/19   Wieters, Hallie C, PA-C  meloxicam (MOBIC) 15 MG tablet Take 1 tablet by mouth daily. 06/17/20   [provider]  methocarbamol (ROBAXIN) 500 MG tablet Take 1 tablet (500 mg total) by mouth 2 (two) times daily. Patient not taking: No sig reported 12/12/16   Jeannett Senior, PA-C  nitroGLYCERIN (NITROSTAT) 0.4 MG SL tablet Place 1 tablet (0.4 mg total) under the tongue every 5 (five) minutes as needed for chest pain. Patient not taking: Reported on 08/04/2018 11/01/14   Thurnell Lose, MD   ondansetron (ZOFRAN ODT) 4 MG disintegrating tablet Take 1 tablet (4 mg total) by mouth every 8 (eight) hours as needed for nausea or vomiting. 07/22/19   Wieters, Hallie C, PA-C  ondansetron (ZOFRAN) 4 MG tablet Take 1 tablet (4 mg total) by mouth every 8 (eight) hours as needed for nausea or vomiting. Patient not taking: No sig reported 05/20/17   Valarie Merino, MD  promethazine-dextromethorphan (PROMETHAZINE-DM) 6.25-15 MG/5ML syrup Take 5 mLs by mouth 4 (four) times daily as needed for cough. 04/20/20   Sharion Balloon, NP  traMADol (ULTRAM) 50 MG tablet Take 50 mg by mouth 3 (three) times daily as needed. 06/03/20   [provider]     Allergies    Prednisone, Bicillin c-r, Ibuprofen, Nsaids, and Penicillins   Review of Systems   Review of Systems A comprehensive review of systems was completed and negative except as noted in HPI.    Physical Exam BP (!) 107/96   Pulse 94   Temp 98 F (36.7 C) (Oral)   Resp 17   SpO2 100%   Physical Exam Vitals and nursing note reviewed.  Constitutional:      Appearance: Normal appearance.  HENT:     Head: Normocephalic and atraumatic.     Nose: Nose normal.     Mouth/Throat:     Mouth: Mucous membranes are moist.  Eyes:     Extraocular Movements: Extraocular movements intact.     Conjunctiva/sclera: Conjunctivae normal.  Cardiovascular:     Rate and Rhythm: Normal rate.  Pulmonary:     Effort: Pulmonary effort is normal.     Breath sounds: Normal breath sounds.  Abdominal:     General: Abdomen is flat.     Palpations: Abdomen is soft.     Tenderness: There is no abdominal tenderness.  Musculoskeletal:        General: Tenderness (mild diffuse L knee) present. No swelling or deformity. Normal range of motion.     Cervical back: Neck supple.  Skin:    General: Skin is warm and dry.  Neurological:     General: No focal deficit present.     Mental Status: She is alert.     Cranial Nerves: No cranial nerve deficit.      Sensory: No sensory deficit.     Motor: No weakness.  Psychiatric:        Mood and Affect: Mood normal.     ED Results / Procedures / Treatments   Labs (all labs ordered are listed, but only abnormal results are displayed) Labs Reviewed - No data to display  EKG None  Radiology DG Knee Complete 4 Views Left  Result Date: 01/19/2021 CLINICAL DATA:  Fall, left knee pain EXAM:  LEFT KNEE - COMPLETE 4+ VIEW COMPARISON:  None. FINDINGS: No evidence of fracture, dislocation, or joint effusion. Mild tricompartmental joint space narrowing with marginal osteophytes. Soft tissues are unremarkable. IMPRESSION: 1.  No acute fracture or dislocation. 2.  Mild knee osteoarthritis. Electronically Signed   By: Keane Police D.O.   On: 01/19/2021 15:28    Procedures Procedures  Medications Ordered in the ED Medications - No data to display   MDM Rules/Calculators/A&P MDM  Patient here primarily for fall with L knee injury. She has a neg xray. Exam is benign. Will place in knee brace, recommend continued APAP and ortho follow up.   With regards to her chronic headaches and balance problems, she was advised this was not something we could adequately workup in the ED today and will be referred to Neurology for further evaluation. PCP follow up recommended as well.  ED Course  I have reviewed the triage vital signs and the nursing notes.  Pertinent labs & imaging results that were available during my care of the patient were reviewed by me and considered in my medical decision making (see chart for details).     Final Clinical Impression(s) / ED Diagnoses Final diagnoses:  Acute pain of left knee  Chronic nonintractable headache, unspecified headache type  Balance problem    Rx / DC Orders ED Discharge Orders     None        Truddie Hidden, MD 01/19/21 [phone]

## 2021-01-19 NOTE — ED Notes (Signed)
Ortho tech aware and coming down

## 2021-01-19 NOTE — ED Provider Notes (Signed)
Emergency Medicine Provider Triage Evaluation Note  Andrea Mullen , a 55 y.o. female  was evaluated in triage.  Pt complains of knee pain.  Patient states that on Thursday she was walking up the steps when the banister gave way and she slid and fell landing on her left knee.  She states that since falling she has had some numbness distal to the knee.  She also states that she feels like she has been off balance since falling.  She denies hitting her head or loss of consciousness.  States that she called her doctor at [facility] who advised her to present to the emergency department.  Review of Systems  Positive: See above Negative:   Physical Exam  BP (!) 107/96   Pulse 94   Temp 98 F (36.7 C) (Oral)   Resp 17   SpO2 100%  Gen:   Awake, no distress   Resp:  Normal effort  MSK:   Moves extremities without difficulty.  Strength 5/5 in bilateral lower extremities.  She has point tenderness to the knee just distal to the patella.  No redness, warmth or obvious swelling.  No obvious deformity. Other:    Medical Decision Making  Medically screening exam initiated at 3:04 PM.  Appropriate orders placed.  NICLOE FRONTERA was informed that the remainder of the evaluation will be completed by another provider, this initial triage assessment does not replace that evaluation, and the importance of remaining in the ED until their evaluation is complete.     Mickie Hillier, PA-C 01/19/21 1506    Truddie Hidden, MD 01/20/21 Lurena Nida

## 2021-04-03 ENCOUNTER — Other Ambulatory Visit: Payer: Self-pay | Admitting: Internal Medicine

## 2021-04-04 LAB — COMPLETE METABOLIC PANEL WITH GFR
AG Ratio: 1.3 (calc) (ref 1.0–2.5)
ALT: 12 U/L (ref 6–29)
AST: 16 U/L (ref 10–35)
Albumin: 4.2 g/dL (ref 3.6–5.1)
Alkaline phosphatase (APISO): 67 U/L (ref 37–153)
BUN: 14 mg/dL (ref 7–25)
CO2: 26 mmol/L (ref 20–32)
Calcium: 9.4 mg/dL (ref 8.6–10.4)
Chloride: 102 mmol/L (ref 98–110)
Creat: 0.72 mg/dL (ref 0.50–1.03)
Globulin: 3.2 g/dL (calc) (ref 1.9–3.7)
Glucose, Bld: 82 mg/dL (ref 65–99)
Potassium: 4.4 mmol/L (ref 3.5–5.3)
Sodium: 137 mmol/L (ref 135–146)
Total Bilirubin: 0.7 mg/dL (ref 0.2–1.2)
Total Protein: 7.4 g/dL (ref 6.1–8.1)
eGFR: 99 mL/min/{1.73_m2} (ref >=60)

## 2021-04-04 LAB — LIPID PANEL
Cholesterol: 243 mg/dL — ABNORMAL HIGH (ref <200)
HDL: 47 mg/dL — ABNORMAL LOW (ref >=50)
LDL Cholesterol (Calc): 174 mg/dL (calc) — ABNORMAL HIGH
Non-HDL Cholesterol (Calc): 196 mg/dL (calc) — ABNORMAL HIGH (ref <130)
Total CHOL/HDL Ratio: 5.2 (calc) — ABNORMAL HIGH (ref <5.0)
Triglycerides: 97 mg/dL (ref <150)

## 2021-04-04 LAB — URINE CULTURE
MICRO NUMBER:: 13048725
Result:: NO GROWTH
SPECIMEN QUALITY:: ADEQUATE

## 2021-04-04 LAB — CBC
HCT: 39.6 % (ref 35.0–45.0)
Hemoglobin: 13.1 g/dL (ref 11.7–15.5)
MCH: 29 pg (ref 27.0–33.0)
MCHC: 33.1 g/dL (ref 32.0–36.0)
MCV: 87.6 fL (ref 80.0–100.0)
MPV: 10.4 fL (ref 7.5–12.5)
Platelets: 220 10*3/uL (ref 140–400)
RBC: 4.52 10*6/uL (ref 3.80–5.10)
RDW: 13.5 % (ref 11.0–15.0)
WBC: 4.5 10*3/uL (ref 3.8–10.8)

## 2021-04-04 LAB — TSH: TSH: 1.8 mIU/L

## 2021-04-04 LAB — VITAMIN D 25 HYDROXY (VIT D DEFICIENCY, FRACTURES): Vit D, 25-Hydroxy: 12 ng/mL — ABNORMAL LOW (ref 30–100)

## 2021-04-23 ENCOUNTER — Ambulatory Visit: Payer: Medicaid Other | Admitting: Podiatry

## 2021-05-05 ENCOUNTER — Ambulatory Visit: Payer: Medicaid Other | Admitting: Podiatrist

## 2021-05-26 ENCOUNTER — Ambulatory Visit: Payer: Medicaid Other | Admitting: Podiatrist

## 2021-05-31 ENCOUNTER — Emergency Department (HOSPITAL_BASED_OUTPATIENT_CLINIC_OR_DEPARTMENT_OTHER)
Admission: EM | Admit: 2021-05-31 | Discharge: 2021-05-31 | Discharge status: Against Medical Advice | Payer: Medicaid Other | Attending: Emergency Medicine | Admitting: Emergency Medicine

## 2021-05-31 ENCOUNTER — Encounter (HOSPITAL_BASED_OUTPATIENT_CLINIC_OR_DEPARTMENT_OTHER): Payer: Self-pay | Admitting: Emergency Medicine

## 2021-05-31 ENCOUNTER — Other Ambulatory Visit: Payer: Self-pay

## 2021-05-31 DIAGNOSIS — M25562 Pain in left knee: Secondary | ICD-10-CM | POA: Diagnosis present

## 2021-05-31 DIAGNOSIS — W108XXA Fall (on) (from) other stairs and steps, initial encounter: Secondary | ICD-10-CM | POA: Insufficient documentation

## 2021-05-31 DIAGNOSIS — Z5321 Procedure and treatment not carried out due to patient leaving prior to being seen by health care provider: Secondary | ICD-10-CM | POA: Insufficient documentation

## 2021-05-31 DIAGNOSIS — Z7982 Long term (current) use of aspirin: Secondary | ICD-10-CM | POA: Insufficient documentation

## 2021-05-31 NOTE — ED Notes (Addendum)
Pt brought back to room and put on a gown, pt then put clothes back on and asked a security officer to be escorted to the cafe upstairs. Pt did not notify anyone that she wanted to leave. Pt not back in room at this time. ?

## 2021-05-31 NOTE — ED Triage Notes (Signed)
Pt fell down steps 2 weeks ago and c/o left knee and hip pain since. ?

## 2021-05-31 NOTE — ED Provider Notes (Deleted)
?[facility] EMERGENCY DEPT ?Provider Note ? ? ?CSN: [id number] ?Arrival date & time: 05/31/21  1849 ? ?  ?Disregard this open note.  Patient eloped prior to any evaluation. ?History ? ?Chief Complaint  ?Patient presents with  ? Knee Pain  ? ? ?Andrea Mullen is a 56 y.o. female. ? ?HPI ? ?  ? ?Home Medications ?Prior to Admission medications   ?Medication Sig Start Date End Date Taking? Authorizing Provider  ?acetaminophen (TYLENOL) 500 MG tablet Take 1 tablet (500 mg total) by mouth every 6 (six) hours as needed. 05/20/17   Valarie Merino, MD  ?aspirin EC 81 MG tablet Take 1 tablet (81 mg total) by mouth daily. ?Patient not taking: No sig reported 11/01/14   Thurnell Lose, MD  ?Aspirin-Acetaminophen-Caffeine (MIGRAINE FORMULA PO) Take 1 tablet by mouth every 6 (six) hours as needed (migraine).    [provider]  ?atorvastatin (LIPITOR) 40 MG tablet Take 1 tablet (40 mg total) by mouth daily. ?Patient not taking: No sig reported 11/01/14   Thurnell Lose, MD  ?ciclopirox (PENLAC) 8 % solution Apply topically at bedtime. Apply over nail and surrounding skin. Apply daily over previous coat. After seven (7) days, may remove with alcohol and continue cycle. 07/16/20   Trula Slade, DPM  ?cyclobenzaprine (FLEXERIL) 5 MG tablet Take 1-2 tablets (5-10 mg total) by mouth 2 (two) times daily as needed for muscle spasms. 07/22/19   Wieters, Hallie C, PA-C  ?diltiazem (CARDIZEM) 60 MG tablet Take 1 tablet (60 mg total) by mouth 3 (three) times daily. Please discontinue the previous Coreg prescription ?Patient not taking: No sig reported 11/01/14   Thurnell Lose, MD  ?HYDROcodone-acetaminophen (NORCO) 5-325 MG tablet Take 1 tablet by mouth every 6 (six) hours as needed for moderate pain. ?Patient not taking: No sig reported 12/12/16   Jeannett Senior, PA-C  ?hydrOXYzine (ATARAX/VISTARIL) 25 MG tablet Take 1 tablet (25 mg total) by mouth every 6 (six) hours. 07/22/19   Wieters, Hallie C,  PA-C  ?ibuprofen (ADVIL) 600 MG tablet Take 1 tablet (600 mg total) by mouth every 6 (six) hours as needed. 07/22/19   Wieters, Hallie C, PA-C  ?meloxicam (MOBIC) 15 MG tablet Take 1 tablet by mouth daily. 06/17/20   [provider]  ?methocarbamol (ROBAXIN) 500 MG tablet Take 1 tablet (500 mg total) by mouth 2 (two) times daily. ?Patient not taking: No sig reported 12/12/16   Jeannett Senior, PA-C  ?nitroGLYCERIN (NITROSTAT) 0.4 MG SL tablet Place 1 tablet (0.4 mg total) under the tongue every 5 (five) minutes as needed for chest pain. ?Patient not taking: Reported on 08/04/2018 11/01/14   Thurnell Lose, MD  ?ondansetron (ZOFRAN ODT) 4 MG disintegrating tablet Take 1 tablet (4 mg total) by mouth every 8 (eight) hours as needed for nausea or vomiting. 07/22/19   Wieters, Hallie C, PA-C  ?ondansetron (ZOFRAN) 4 MG tablet Take 1 tablet (4 mg total) by mouth every 8 (eight) hours as needed for nausea or vomiting. ?Patient not taking: No sig reported 05/20/17   Valarie Merino, MD  ?promethazine-dextromethorphan (PROMETHAZINE-DM) 6.25-15 MG/5ML syrup Take 5 mLs by mouth 4 (four) times daily as needed for cough. 04/20/20   Sharion Balloon, NP  ?traMADol (ULTRAM) 50 MG tablet Take 50 mg by mouth 3 (three) times daily as needed. 06/03/20   [provider]  ?   ? ?Allergies    ?Prednisone, Bicillin c-r, Ibuprofen, Nsaids, and Penicillins   ? ?Review of  Systems   ?Review of Systems ? ?Physical Exam ?Updated Vital Signs ?BP 113/73   Pulse 93   Temp 98.3 ?F (36.8 ?C) (Oral)   Resp 20   SpO2 100%  ?Physical Exam ? ?ED Results / Procedures / Treatments   ?Labs ?(all labs ordered are listed, but only abnormal results are displayed) ?Labs Reviewed - No data to display ? ?EKG ?None ? ?Radiology ?No results found. ? ?Procedures ?Procedures  ? ? ?Medications Ordered in ED ?Medications - No data to display ? ?ED Course/ Medical Decision Making/ A&P ?  ?                        ?Medical Decision  Making ? ? ? ? ? ? ? ? ?Final Clinical Impression(s) / ED Diagnoses ?Final diagnoses:  ?None  ? ? ?Rx / DC Orders ?ED Discharge Orders   ? ? None  ? ?  ? ? ?  ?Charlesetta Shanks, MD ?06/02/21 1931 ? ?

## 2021-06-23 ENCOUNTER — Ambulatory Visit (INDEPENDENT_AMBULATORY_CARE_PROVIDER_SITE_OTHER): Payer: Medicaid Other | Admitting: Podiatrist

## 2021-06-23 ENCOUNTER — Encounter: Payer: Self-pay | Admitting: Podiatrist

## 2021-06-23 DIAGNOSIS — M7662 Achilles tendinitis, left leg: Secondary | ICD-10-CM | POA: Diagnosis not present

## 2021-06-23 DIAGNOSIS — M25571 Pain in right ankle and joints of right foot: Secondary | ICD-10-CM

## 2021-06-23 NOTE — Patient Instructions (Signed)
Try the boot, heel lifts and ankle squeezy sock.  If no better after a couple weeks, call and I will write you some Physical therapy. ? ?Try naproxen 200mg  twice daily every day for 14 days. ? ? ?

## 2021-06-23 NOTE — Progress Notes (Signed)
Subjective: ?Andrea Mullen is a 56 y.o. female patient who presents to office today for pain on the back of the left heel and pain on the medial side of the right ankle.  ? ?Patient Active Problem List  ? Diagnosis Date Noted  ? Elevated troponin   ? NSTEMI (non-ST elevated myocardial infarction) ([place])   ? Polysubstance abuse ([place])   ? Anxiety state   ? Atypical chest pain 10/30/2014  ? Smoker 10/30/2014  ? Chest pain 10/30/2014  ? Homicidal ideation   ? MALIGNANT NEOPLASM BRONCHUS&LUNG UNSPEC SITE 07/11/2007  ? Morbid obesity ([place]) 07/11/2007  ? TOBACCO ABUSE-HISTORY OF 07/11/2007  ? PULMONARY NODULE 06/07/2007  ? ?Current Outpatient Medications on File Prior to Visit  ?Medication Sig Dispense Refill  ? acetaminophen (TYLENOL) 500 MG tablet Take 1 tablet (500 mg total) by mouth every 6 (six) hours as needed. 30 tablet 0  ? aspirin EC 81 MG tablet Take 1 tablet (81 mg total) by mouth daily. (Patient not taking: No sig reported) 30 tablet 0  ? Aspirin-Acetaminophen-Caffeine (MIGRAINE FORMULA PO) Take 1 tablet by mouth every 6 (six) hours as needed (migraine).    ? atorvastatin (LIPITOR) 40 MG tablet Take 1 tablet (40 mg total) by mouth daily. (Patient not taking: No sig reported) 30 tablet 0  ? ciclopirox (PENLAC) 8 % solution Apply topically at bedtime. Apply over nail and surrounding skin. Apply daily over previous coat. After seven (7) days, may remove with alcohol and continue cycle. 6.6 mL 0  ? cyclobenzaprine (FLEXERIL) 5 MG tablet Take 1-2 tablets (5-10 mg total) by mouth 2 (two) times daily as needed for muscle spasms. 24 tablet 0  ? diltiazem (CARDIZEM) 60 MG tablet Take 1 tablet (60 mg total) by mouth 3 (three) times daily. Please discontinue the previous Coreg prescription (Patient not taking: No sig reported) 90 tablet 0  ? HYDROcodone-acetaminophen (NORCO) 5-325 MG tablet Take 1 tablet by mouth every 6 (six) hours as needed for moderate pain. (Patient not taking: No sig reported) 10 tablet 0  ?  hydrOXYzine (ATARAX/VISTARIL) 25 MG tablet Take 1 tablet (25 mg total) by mouth every 6 (six) hours. 20 tablet 0  ? ibuprofen (ADVIL) 600 MG tablet Take 1 tablet (600 mg total) by mouth every 6 (six) hours as needed. 30 tablet 0  ? meloxicam (MOBIC) 15 MG tablet Take 1 tablet by mouth daily.    ? methocarbamol (ROBAXIN) 500 MG tablet Take 1 tablet (500 mg total) by mouth 2 (two) times daily. (Patient not taking: No sig reported) 12 tablet 0  ? nitroGLYCERIN (NITROSTAT) 0.4 MG SL tablet Place 1 tablet (0.4 mg total) under the tongue every 5 (five) minutes as needed for chest pain. (Patient not taking: Reported on 08/04/2018) 20 tablet 12  ? ondansetron (ZOFRAN ODT) 4 MG disintegrating tablet Take 1 tablet (4 mg total) by mouth every 8 (eight) hours as needed for nausea or vomiting. 20 tablet 0  ? ondansetron (ZOFRAN) 4 MG tablet Take 1 tablet (4 mg total) by mouth every 8 (eight) hours as needed for nausea or vomiting. (Patient not taking: No sig reported) 4 tablet 0  ? promethazine-dextromethorphan (PROMETHAZINE-DM) 6.25-15 MG/5ML syrup Take 5 mLs by mouth 4 (four) times daily as needed for cough. 100 mL 0  ? traMADol (ULTRAM) 50 MG tablet Take 50 mg by mouth 3 (three) times daily as needed.    ? ?No current facility-administered medications on file prior to visit.  ? ?Allergies  ?Allergen Reactions  ?  Prednisone Anaphylaxis  ?  seizure  ? Bicillin C-R Hives  ? Ibuprofen Nausea Only  ? Nsaids Other (See Comments)  ?  Hives ?  ? Penicillins Hives  ?  Has patient had a PCN reaction causing immediate rash, facial/tongue/throat swelling, SOB or lightheadedness with hypotension: No ?Has patient had a PCN reaction causing severe rash involving mucus membranes or skin necrosis: Yes ?Has patient had a PCN reaction that required hospitalization No ?Has patient had a PCN reaction occurring within the last 10 years: Yes  ?If all of the above answers are "NO", then may proceed with Cephalosporin use. ?   ? ? ? ?Objective: ?General: Patient is awake, alert, and oriented x 3 and in no acute distress. ? ?Integument: Skin is warm, dry and supple bilateral. Nails are asymptomatic 1-5 bilateral. No signs of infection. No open lesions or preulcerative lesions present bilateral. Remaining integument unremarkable. ? ?Vasculature:  Dorsalis Pedis pulse 2/4 bilateral. Posterior Tibial pulse 2 /4 bilateral.  ?Capillary fill time <3 sec 1-5 bilateral. Positive hair growth to the level of the digits. ?Temperature gradient within normal limits. No varicosities present bilateral. No edema present bilateral.  ? ?Neurology: The patient has intact sensation measured with a 5.07/10g Semmes Weinstein Monofilament at all pedal sites bilateral . Vibratory sensation intact bilateral with tuning fork. No Babinski sign present bilateral.  ? ?Musculoskeletal: pes planus foot type noted bilateral. Pain at insertion of achiles on the posterior calcaneus noted left.  Right ankle pain at the anterior aspect of the medial deltoid ligament noted. No pain at the sinus tarsi.  ? ?Assessment and Plan: ?  ICD-10-CM   ?1. Tendonitis, Achilles, left  M76.62   ?  ?2. Right ankle pain, unspecified chronicity  M25.571   ?  ? ? ? ?-Examined patient. ?- recommended a boot and wrote a rx for [facility] to obtain the boot. ?- dispensed heel lifts and a compressive piece of surgigrip for her right ankle ?-recommended use of OTC naprosyn for 2 weeks  ?-she will call if she would like to go to Physical therapy for her foot pain conditions.  ?-Patient advised to call the office if any problems or questions arise in the meantime. ? ?Bronson Ing, DPM ?

## 2021-11-08 ENCOUNTER — Observation Stay (HOSPITAL_BASED_OUTPATIENT_CLINIC_OR_DEPARTMENT_OTHER)
Admission: EM | Admit: 2021-11-08 | Discharge: 2021-11-10 | Disposition: A | Discharge status: Home Health | Payer: Medicaid Other | Attending: Family Medicine | Admitting: Family Medicine

## 2021-11-08 ENCOUNTER — Emergency Department (HOSPITAL_BASED_OUTPATIENT_CLINIC_OR_DEPARTMENT_OTHER): Payer: Medicaid Other

## 2021-11-08 ENCOUNTER — Encounter (HOSPITAL_COMMUNITY): Payer: Self-pay

## 2021-11-08 ENCOUNTER — Encounter (HOSPITAL_BASED_OUTPATIENT_CLINIC_OR_DEPARTMENT_OTHER): Payer: Self-pay | Admitting: Emergency Medicine

## 2021-11-08 ENCOUNTER — Other Ambulatory Visit: Payer: Self-pay

## 2021-11-08 DIAGNOSIS — I2510 Atherosclerotic heart disease of native coronary artery without angina pectoris: Secondary | ICD-10-CM

## 2021-11-08 DIAGNOSIS — Z79899 Other long term (current) drug therapy: Secondary | ICD-10-CM | POA: Insufficient documentation

## 2021-11-08 DIAGNOSIS — E785 Hyperlipidemia, unspecified: Secondary | ICD-10-CM

## 2021-11-08 DIAGNOSIS — I10 Essential (primary) hypertension: Secondary | ICD-10-CM

## 2021-11-08 DIAGNOSIS — H5462 Unqualified visual loss, left eye, normal vision right eye: Secondary | ICD-10-CM | POA: Diagnosis present

## 2021-11-08 DIAGNOSIS — IMO0001 Reserved for inherently not codable concepts without codable children: Secondary | ICD-10-CM

## 2021-11-08 DIAGNOSIS — Z20822 Contact with and (suspected) exposure to covid-19: Secondary | ICD-10-CM | POA: Diagnosis not present

## 2021-11-08 DIAGNOSIS — E782 Mixed hyperlipidemia: Secondary | ICD-10-CM | POA: Diagnosis not present

## 2021-11-08 DIAGNOSIS — R9431 Abnormal electrocardiogram [ECG] [EKG]: Secondary | ICD-10-CM | POA: Insufficient documentation

## 2021-11-08 DIAGNOSIS — J45909 Unspecified asthma, uncomplicated: Secondary | ICD-10-CM | POA: Diagnosis not present

## 2021-11-08 DIAGNOSIS — I350 Nonrheumatic aortic (valve) stenosis: Secondary | ICD-10-CM

## 2021-11-08 DIAGNOSIS — Z85118 Personal history of other malignant neoplasm of bronchus and lung: Secondary | ICD-10-CM | POA: Diagnosis not present

## 2021-11-08 DIAGNOSIS — I639 Cerebral infarction, unspecified: Principal | ICD-10-CM | POA: Diagnosis present

## 2021-11-08 DIAGNOSIS — F17200 Nicotine dependence, unspecified, uncomplicated: Secondary | ICD-10-CM

## 2021-11-08 DIAGNOSIS — D509 Iron deficiency anemia, unspecified: Secondary | ICD-10-CM | POA: Diagnosis not present

## 2021-11-08 DIAGNOSIS — F411 Generalized anxiety disorder: Secondary | ICD-10-CM | POA: Diagnosis present

## 2021-11-08 DIAGNOSIS — R8290 Unspecified abnormal findings in urine: Secondary | ICD-10-CM | POA: Diagnosis not present

## 2021-11-08 DIAGNOSIS — Z7982 Long term (current) use of aspirin: Secondary | ICD-10-CM | POA: Diagnosis not present

## 2021-11-08 DIAGNOSIS — H53132 Sudden visual loss, left eye: Secondary | ICD-10-CM

## 2021-11-08 DIAGNOSIS — J984 Other disorders of lung: Secondary | ICD-10-CM | POA: Diagnosis present

## 2021-11-08 DIAGNOSIS — D649 Anemia, unspecified: Secondary | ICD-10-CM

## 2021-11-08 DIAGNOSIS — F17210 Nicotine dependence, cigarettes, uncomplicated: Secondary | ICD-10-CM | POA: Insufficient documentation

## 2021-11-08 DIAGNOSIS — R911 Solitary pulmonary nodule: Secondary | ICD-10-CM | POA: Diagnosis not present

## 2021-11-08 DIAGNOSIS — R040 Epistaxis: Secondary | ICD-10-CM | POA: Diagnosis not present

## 2021-11-08 HISTORY — DX: Hyperlipidemia, unspecified: E78.5

## 2021-11-08 LAB — RETICULOCYTES
Immature Retic Fract: 9.1 % (ref 2.3–15.9)
RBC.: 4.12 MIL/uL (ref 3.87–5.11)
Retic Count, Absolute: 66.7 10*3/uL (ref 19.0–186.0)
Retic Ct Pct: 1.6 % (ref 0.4–3.1)

## 2021-11-08 LAB — COMPREHENSIVE METABOLIC PANEL
ALT: 10 U/L (ref 0–44)
AST: 13 U/L — ABNORMAL LOW (ref 15–41)
Albumin: 4.2 g/dL (ref 3.5–5.0)
Alkaline Phosphatase: 62 U/L (ref 38–126)
Anion gap: 7 (ref 5–15)
BUN: 12 mg/dL (ref 6–20)
CO2: 29 mmol/L (ref 22–32)
Calcium: 9.6 mg/dL (ref 8.9–10.3)
Chloride: 103 mmol/L (ref 98–111)
Creatinine, Ser: 0.78 mg/dL (ref 0.44–1.00)
GFR, Estimated: >60 mL/min (ref >60)
Glucose, Bld: 99 mg/dL (ref 70–99)
Potassium: 4 mmol/L (ref 3.5–5.1)
Sodium: 139 mmol/L (ref 135–145)
Total Bilirubin: 0.6 mg/dL (ref 0.3–1.2)
Total Protein: 7.1 g/dL (ref 6.5–8.1)

## 2021-11-08 LAB — URINALYSIS, ROUTINE W REFLEX MICROSCOPIC
Bilirubin Urine: NEGATIVE
Glucose, UA: NEGATIVE mg/dL
Hgb urine dipstick: NEGATIVE
Ketones, ur: NEGATIVE mg/dL
Nitrite: NEGATIVE
Protein, ur: NEGATIVE mg/dL
Specific Gravity, Urine: 1.02 (ref 1.005–1.030)
pH: 5.5 (ref 5.0–8.0)

## 2021-11-08 LAB — DIFFERENTIAL
Abs Immature Granulocytes: 0.01 10*3/uL (ref 0.00–0.07)
Basophils Absolute: 0 10*3/uL (ref 0.0–0.1)
Basophils Relative: 0 %
Eosinophils Absolute: 0.1 10*3/uL (ref 0.0–0.5)
Eosinophils Relative: 3 %
Immature Granulocytes: 0 %
Lymphocytes Relative: 32 %
Lymphs Abs: 1.5 10*3/uL (ref 0.7–4.0)
Monocytes Absolute: 0.4 10*3/uL (ref 0.1–1.0)
Monocytes Relative: 8 %
Neutro Abs: 2.5 10*3/uL (ref 1.7–7.7)
Neutrophils Relative %: 57 %

## 2021-11-08 LAB — ETHANOL: Alcohol, Ethyl (B): <10 mg/dL (ref <10)

## 2021-11-08 LAB — CBC
HCT: 36.8 % (ref 36.0–46.0)
Hemoglobin: 11.9 g/dL — ABNORMAL LOW (ref 12.0–15.0)
MCH: 29 pg (ref 26.0–34.0)
MCHC: 32.3 g/dL (ref 30.0–36.0)
MCV: 89.5 fL (ref 80.0–100.0)
Platelets: 183 10*3/uL (ref 150–400)
RBC: 4.11 MIL/uL (ref 3.87–5.11)
RDW: 13.4 % (ref 11.5–15.5)
WBC: 4.5 10*3/uL (ref 4.0–10.5)
nRBC: 0 % (ref 0.0–0.2)

## 2021-11-08 LAB — PREGNANCY, URINE: Preg Test, Ur: NEGATIVE

## 2021-11-08 LAB — HEMOGLOBIN A1C
Hgb A1c MFr Bld: 5.3 % (ref 4.8–5.6)
Mean Plasma Glucose: 105.41 mg/dL

## 2021-11-08 LAB — RESP PANEL BY RT-PCR (FLU A&B, COVID) ARPGX2
Influenza A by PCR: NEGATIVE
Influenza B by PCR: NEGATIVE
SARS Coronavirus 2 by RT PCR: NEGATIVE

## 2021-11-08 LAB — RAPID URINE DRUG SCREEN, HOSP PERFORMED
Amphetamines: NOT DETECTED
Barbiturates: NOT DETECTED
Benzodiazepines: NOT DETECTED
Cocaine: NOT DETECTED
Opiates: NOT DETECTED
Tetrahydrocannabinol: NOT DETECTED

## 2021-11-08 MED ORDER — ALBUTEROL SULFATE (2.5 MG/3ML) 0.083% IN NEBU: 2.5000 mg | INHALATION_SOLUTION | Freq: Four times a day (QID) | RESPIRATORY_TRACT | Status: DC | PRN

## 2021-11-08 MED ORDER — ENOXAPARIN SODIUM 40 MG/0.4ML IJ SOSY
40.0000 mg | PREFILLED_SYRINGE | Freq: Every day | INTRAMUSCULAR | Status: DC
  Filled 2021-11-08 (×2): qty 0.4

## 2021-11-08 MED ORDER — IOHEXOL 350 MG/ML SOLN
100.0000 mL | Freq: Once | INTRAVENOUS | Status: AC | PRN
  Administered 2021-11-08: 100 mL via INTRAVENOUS

## 2021-11-08 MED ORDER — ACETAMINOPHEN 650 MG RE SUPP: 650.0000 mg | RECTAL | Status: DC | PRN

## 2021-11-08 MED ORDER — CLOPIDOGREL BISULFATE 300 MG PO TABS
300.0000 mg | ORAL_TABLET | Freq: Once | ORAL | Status: AC
  Administered 2021-11-08: 300 mg via ORAL
  Filled 2021-11-08: qty 1

## 2021-11-08 MED ORDER — STROKE: EARLY STAGES OF RECOVERY BOOK
Freq: Once | Status: AC
  Filled 2021-11-08: qty 1

## 2021-11-08 MED ORDER — ACETAMINOPHEN 160 MG/5ML PO SOLN: 650.0000 mg | ORAL | Status: DC | PRN

## 2021-11-08 MED ORDER — ASPIRIN 325 MG PO TABS
325.0000 mg | ORAL_TABLET | Freq: Every day | ORAL | Status: DC
  Administered 2021-11-08: 325 mg via ORAL
  Filled 2021-11-08: qty 1

## 2021-11-08 MED ORDER — ACETAMINOPHEN 325 MG PO TABS: 650.0000 mg | ORAL_TABLET | ORAL | Status: DC | PRN

## 2021-11-08 NOTE — Progress Notes (Signed)
Plan of Care Note for accepted transfer   Patient: Andrea Mullen MRN: [id number]   DOA: 11/08/2021  Facility requesting transfer: [facility] Requesting Provider: Armandina Gemma Reason for transfer: Concern for CVA Facility course: Patient with h/o lung CA s/p chemo/rads, HTN, and HLD presenting with acute onset of UL vision loss.  This occurred a month ago, saw eye doctor yesterday and he recommended she come.  CTA concerning for stenosis.  Neurology recommends permissive HTN, ASA, Plavix.  BP is normal currently.  Needs MRI brain and orbits with/without contrast.  On CT also with chest nodule, suggested to have chest CT for further evaluation, she is a daily smoker (not ordered since she already got contrast).     Plan of care: The patient is accepted for observation to Telemetry unit, at [facility].  Author: Karmen Bongo, MD 11/08/2021  Check [email or web address] for on-call coverage.  Nursing staff, Please call [facility] number on Amion as soon as patient's arrival, so appropriate admitting provider can evaluate the pt.

## 2021-11-08 NOTE — ED Provider Notes (Signed)
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 female who presents emergency department with chief complaint of vision loss in the left eye.  Patient states that this occurred 1 month ago.  She followed up with her eye doctor yesterday who did an eye exam and told her that she likely had a stroke and needed to come to the ER.  Patient presented today for evaluation.  She denies any other neurologic deficits.  She is able to see a little bit of light and shadow out of her left eye but has no other vision.  She wears glasses.  She denies injuries to the eye.  She is a daily cigarette smoker, she has high blood pressure, high cholesterol and history of diabetes.   Eye Problem Associated symptoms: no discharge, no headaches, no itching, no numbness and no weakness        Home Medications Prior to Admission medications   Medication Sig Start Date End Date Taking? Authorizing Provider  acetaminophen (TYLENOL) 500 MG tablet Take 1 tablet (500 mg total) by mouth every 6 (six) hours as needed. 05/20/17   Valarie Merino, MD  aspirin EC 81 MG tablet Take 1 tablet (81 mg total) by mouth daily. Patient not taking: No sig reported 11/01/14   Thurnell Lose, MD  Aspirin-Acetaminophen-Caffeine (MIGRAINE FORMULA PO) Take 1 tablet by mouth every 6 (six) hours as needed (migraine).    [provider]  atorvastatin (LIPITOR) 40 MG tablet Take 1 tablet (40 mg total) by mouth daily. Patient not taking: No sig reported 11/01/14   Thurnell Lose, MD  ciclopirox [facility]) 8 % solution Apply topically at bedtime. Apply over nail and surrounding skin. Apply daily over previous coat. After seven (7) days, may remove with alcohol and continue cycle. 07/16/20   Trula Slade, DPM  cyclobenzaprine (FLEXERIL)  5 MG tablet Take 1-2 tablets (5-10 mg total) by mouth 2 (two) times daily as needed for muscle spasms. 07/22/19   Wieters, Hallie C, PA-C  diltiazem (CARDIZEM) 60 MG tablet Take 1 tablet (60 mg total) by mouth 3 (three) times daily. Please discontinue the previous Coreg prescription Patient not taking: No sig reported 11/01/14   Thurnell Lose, MD  HYDROcodone-acetaminophen (NORCO) 5-325 MG tablet Take 1 tablet by mouth every 6 (six) hours as needed for moderate pain. Patient not taking: No sig reported 12/12/16   Jeannett Senior, PA-C  hydrOXYzine (ATARAX/VISTARIL) 25 MG tablet Take 1 tablet (25 mg total) by mouth every 6 (six) hours. 07/22/19   Wieters, Hallie C, PA-C  ibuprofen (ADVIL) 600 MG tablet Take 1 tablet (600 mg total) by mouth every 6 (six) hours as needed. 07/22/19   Wieters, Hallie C, PA-C  meloxicam (MOBIC) 15 MG tablet Take 1 tablet by mouth daily. 06/17/20   [provider]  methocarbamol (ROBAXIN) 500 MG tablet Take 1 tablet (500 mg total) by mouth 2 (two) times daily. Patient not taking: No sig reported 12/12/16   Jeannett Senior, PA-C  nitroGLYCERIN (NITROSTAT) 0.4 MG SL tablet Place 1 tablet (0.4 mg total) under the tongue every 5 (five) minutes as needed for chest pain. Patient not taking: Reported on 08/04/2018 11/01/14   Thurnell Lose, MD  ondansetron (ZOFRAN ODT) 4 MG disintegrating  tablet Take 1 tablet (4 mg total) by mouth every 8 (eight) hours as needed for nausea or vomiting. 07/22/19   Wieters, Hallie C, PA-C  ondansetron (ZOFRAN) 4 MG tablet Take 1 tablet (4 mg total) by mouth every 8 (eight) hours as needed for nausea or vomiting. Patient not taking: No sig reported 05/20/17   Valarie Merino, MD  promethazine-dextromethorphan (PROMETHAZINE-DM) 6.25-15 MG/5ML syrup Take 5 mLs by mouth 4 (four) times daily as needed for cough. 04/20/20   Sharion Balloon, NP  traMADol (ULTRAM) 50 MG tablet Take 50 mg by mouth 3 (three) times daily as needed. 06/03/20    [provider]      Allergies    Prednisone, Bicillin c-r, Ibuprofen, Nsaids, and Penicillins    Review of Systems   Review of Systems  Eyes:  Positive for visual disturbance. Negative for pain, discharge and itching.  Neurological:  Negative for dizziness, tremors, seizures, syncope, facial asymmetry, speech difficulty, weakness, light-headedness, numbness and headaches.    Physical Exam Updated Vital Signs BP 130/85 (BP Location: Right Arm)   Pulse 96   Temp 98.1 F (36.7 C) (Oral)   Resp 20   LMP 11/16/2016   SpO2 98%  Physical Exam Vitals and nursing note reviewed.  Constitutional:      General: She is not in acute distress.    Appearance: She is well-developed. She is not diaphoretic.  HENT:     Head: Normocephalic and atraumatic.     Right Ear: External ear normal.     Left Ear: External ear normal.     Nose: Nose normal.     Mouth/Throat:     Mouth: Mucous membranes are moist.  Eyes:     General: No scleral icterus.    Conjunctiva/sclera: Conjunctivae normal.     Comments: Pos test of skew. No vision in the left eye.  Miotic pupils- equivocal exam to light and pupillary response  Cardiovascular:     Rate and Rhythm: Normal rate and regular rhythm.     Heart sounds: Normal heart sounds. No murmur heard.    No friction rub. No gallop.  Pulmonary:     Effort: Pulmonary effort is normal. No respiratory distress.     Breath sounds: Normal breath sounds.  Abdominal:     General: Bowel sounds are normal. There is no distension.     Palpations: Abdomen is soft. There is no mass.     Tenderness: There is no abdominal tenderness. There is no guarding.  Musculoskeletal:     Cervical back: Normal range of motion.  Skin:    General: Skin is warm and dry.  Neurological:     Mental Status: She is alert and oriented to person, place, and time.     Cranial Nerves: Cranial nerve deficit present.     Sensory: No sensory deficit.     Motor: No weakness.      Coordination: Coordination normal.     Gait: Gait normal.     Deep Tendon Reflexes: Reflexes normal.  Psychiatric:        Behavior: Behavior normal.     ED Results / Procedures / Treatments   Labs (all labs ordered are listed, but only abnormal results are displayed) Labs Reviewed  RESP PANEL BY RT-PCR (FLU A&B, COVID) ARPGX2  ETHANOL  CBC  DIFFERENTIAL  COMPREHENSIVE METABOLIC PANEL  RAPID URINE DRUG SCREEN, HOSP PERFORMED  URINALYSIS, ROUTINE W REFLEX MICROSCOPIC  PREGNANCY, URINE    EKG None  Radiology  No results found.  Procedures Procedures    Medications Ordered in ED Medications - No data to display  ED Course/ Medical Decision Making/ A&P Clinical Course as of 11/08/21 2226  Sat Nov 08, 2021  1324 I visualized and interpreted EKG, sinus rhythm at a rate of 86 [AH]  1324 Comprehensive metabolic panel(!) CMP without significant abnormalities [AH]  1324 CBC(!) CBC with mild normocytic anemia of insignificant value [AH]  1538 MRI  brain and orbits w/wo , asa/ plavix and permissive hypertension. Per Dr. Quinn Axe  [AH]    Clinical Course User Index [AH] Margarita Mail, PA-C                           Medical Decision Making Patient here with painless vision loss x 1 month. CRAO,CRVO, retinal detachment, trace hemorrhage. Patient seen in the outpatient setting by ophthalmology.  Had a normal eye examination and was told that she needed to come in for rule out of stroke.  She has no other abnormal findings on her neurologic exam.  Case discussed with Dr. Su Monks who recommends admission, permissive hypertension, Plavix, aspirin.  Amount and/or Complexity of Data Reviewed Labs: ordered. Decision-making details documented in ED Course.    Details: As discussed in ED course Radiology: ordered and independent interpretation performed.    Details: Interpreted CT angiogram of the head and neck which shows abnormal caliber and stenosis of cerebral  arteries ECG/medicine tests: ordered and independent interpretation performed.  Risk Prescription drug management. Decision regarding hospitalization.           Final Clinical Impression(s) / ED Diagnoses Final diagnoses:  None    Rx / DC Orders ED Discharge Orders     None         Margarita Mail, PA-C 11/08/21 2305    Regan Lemming, MD 11/09/21 [phone]

## 2021-11-08 NOTE — H&P (Signed)
History and Physical    ANNEL ZUNKER XFG:[id number] DOB: 26-Sep-1965 DOA: 11/08/2021  PCP: Nolene Ebbs, MD  Patient coming from: Home  Chief Complaint: Vision loss  HPI: Andrea Mullen is a 56 y.o. female with medical history significant of anxiety, asthma, history of lung cancer status post chemo and radiation, hyperlipidemia, hypertension, CAD, anemia, polysubstance abuse (cocaine, marijuana, tobacco) presented to the ED with complaint of left eye vision loss x1 month.  Saw ophthalmology yesterday and was advised to come into the ED due to concern for stroke.  Patient presented to the ED today.  In the ED, vital signs stable.  No significant lab abnormalities except hemoglobin 11.9 with MCV 89.5 (hemoglobin was 13.1 in February 2023).  UA showing negative nitrite, moderate leukocytes, and microscopy showing 11-20 WBCs, and rare bacteria.    CTA head and neck showing: "IMPRESSION: CT head:   1. No evidence of acute intracranial abnormality. 2. Parenchymal atrophy and progressive chronic small-vessel ischemic disease with chronic lacunar infarcts as described.   CTA neck:   1. The common carotid and internal carotid arteries are patent within the neck without hemodynamically significant stenosis. Mild atherosclerotic plaque about the left carotid bifurcation and within the proximal left ICA. 2. Venous reflux from a left-sided contrast bolus partially obscures the V1 and proximal V2 left vertebral artery. Within this limitation, the cervical vertebral arteries are patent without hemodynamically significant stenosis. 3. Apparent partially imaged right upper lobe pulmonary nodule, measuring at least 8 mm, and concerning for possible pulmonary neoplasm. A chest CT is recommended for further evaluation.   CTA head:   1. No intracranial large vessel occlusion is identified. 2. Intracranial atherosclerotic disease with multifocal stenoses, most notably as follows. 3. Moderate  stenosis within the cavernous right ICA. 4. Moderate to severe stenosis within the cavernous left ICA. 5. Moderate stenosis within the left PCA P2 segment. 6. Severe stenoses within PCA branches at the P2/P3 junction, bilaterally."  ED provider discussed the case with neurology and patient was given aspirin 325 mg and Plavix 300 mg.  Neurology recommended permissive hypertension and MRI of brain and orbits with and without contrast for further evaluation.  Patient reports 6-week history of left eye vision loss.  States she was seen by her ophthalmologist 2 days ago and advised to come into the ED for further evaluation due to concern for stroke.  Patient states she does not like going to hospitals and came into the ED today.  Denies history of prior strokes.  She has also experienced intermittent left arm numbness which goes away every time she massages her armpit.  Denies any weakness.  Denies fevers, chills, cough, shortness of breath, chest pain, nausea, vomiting, abdominal pain, diarrhea, constipation, or any urinary symptoms.  Denies hematemesis, hematochezia, or melena.  She reports history of lung cancer 13 years ago and received treatment at that time.  She continues to smoke 2 to 3 cigarettes daily.  Review of Systems:  Review of Systems  All other systems reviewed and are negative.   Past Medical History:  Diagnosis Date   Anxiety    Asthma    Cancer ([place])    lung, tx radiation and chemo   Hyperlipemia    Hypertension     Past Surgical History:  Procedure Laterality Date   CARDIAC CATHETERIZATION N/A 11/01/2014   Procedure: Left Heart Cath and Coronary Angiography;  Surgeon: Belva Crome, MD;  Location: [facility];  Service: Cardiovascular;  Laterality: N/A;  CESAREAN SECTION     x3   HERNIA REPAIR       reports that she has been smoking cigarettes. She has been smoking an average of .5 packs per day. She has never used smokeless tobacco. She reports that she does  not currently use alcohol. She reports that she does not currently use drugs after having used the following drugs: Marijuana and Cocaine.  Allergies  Allergen Reactions   Prednisone Anaphylaxis    seizure   Bicillin C-R Hives   Ibuprofen Nausea Only   Nsaids Other (See Comments)    Hives    Penicillins Hives    Has patient had a PCN reaction causing immediate rash, facial/tongue/throat swelling, SOB or lightheadedness with hypotension: No Has patient had a PCN reaction causing severe rash involving mucus membranes or skin necrosis: Yes Has patient had a PCN reaction that required hospitalization No Has patient had a PCN reaction occurring within the last 10 years: Yes  If all of the above answers are "NO", then may proceed with Cephalosporin use.     Family History  Problem Relation Age of Onset   Clotting disorder Mother     Prior to Admission medications   Medication Sig Start Date End Date Taking? Authorizing Provider  acetaminophen (TYLENOL) 500 MG tablet Take 1 tablet (500 mg total) by mouth every 6 (six) hours as needed. 05/20/17   Valarie Merino, MD  aspirin EC 81 MG tablet Take 1 tablet (81 mg total) by mouth daily. Patient not taking: No sig reported 11/01/14   Thurnell Lose, MD  Aspirin-Acetaminophen-Caffeine (MIGRAINE FORMULA PO) Take 1 tablet by mouth every 6 (six) hours as needed (migraine).    [provider]  atorvastatin (LIPITOR) 40 MG tablet Take 1 tablet (40 mg total) by mouth daily. Patient not taking: No sig reported 11/01/14   Thurnell Lose, MD  ciclopirox [facility]) 8 % solution Apply topically at bedtime. Apply over nail and surrounding skin. Apply daily over previous coat. After seven (7) days, may remove with alcohol and continue cycle. 07/16/20   Trula Slade, DPM  cyclobenzaprine (FLEXERIL) 5 MG tablet Take 1-2 tablets (5-10 mg total) by mouth 2 (two) times daily as needed for muscle spasms. 07/22/19   Wieters, Hallie C, PA-C  diltiazem  (CARDIZEM) 60 MG tablet Take 1 tablet (60 mg total) by mouth 3 (three) times daily. Please discontinue the previous Coreg prescription Patient not taking: No sig reported 11/01/14   Thurnell Lose, MD  HYDROcodone-acetaminophen (NORCO) 5-325 MG tablet Take 1 tablet by mouth every 6 (six) hours as needed for moderate pain. Patient not taking: No sig reported 12/12/16   Jeannett Senior, PA-C  hydrOXYzine (ATARAX/VISTARIL) 25 MG tablet Take 1 tablet (25 mg total) by mouth every 6 (six) hours. 07/22/19   Wieters, Hallie C, PA-C  ibuprofen (ADVIL) 600 MG tablet Take 1 tablet (600 mg total) by mouth every 6 (six) hours as needed. 07/22/19   Wieters, Hallie C, PA-C  meloxicam (MOBIC) 15 MG tablet Take 1 tablet by mouth daily. 06/17/20   [provider]  methocarbamol (ROBAXIN) 500 MG tablet Take 1 tablet (500 mg total) by mouth 2 (two) times daily. Patient not taking: No sig reported 12/12/16   Jeannett Senior, PA-C  nitroGLYCERIN (NITROSTAT) 0.4 MG SL tablet Place 1 tablet (0.4 mg total) under the tongue every 5 (five) minutes as needed for chest pain. Patient not taking: Reported on 08/04/2018 11/01/14   Thurnell Lose, MD  ondansetron (ZOFRAN ODT) 4 MG disintegrating tablet Take 1 tablet (4 mg total) by mouth every 8 (eight) hours as needed for nausea or vomiting. 07/22/19   Wieters, Hallie C, PA-C  ondansetron (ZOFRAN) 4 MG tablet Take 1 tablet (4 mg total) by mouth every 8 (eight) hours as needed for nausea or vomiting. Patient not taking: No sig reported 05/20/17   Valarie Merino, MD  promethazine-dextromethorphan (PROMETHAZINE-DM) 6.25-15 MG/5ML syrup Take 5 mLs by mouth 4 (four) times daily as needed for cough. 04/20/20   Sharion Balloon, NP  traMADol (ULTRAM) 50 MG tablet Take 50 mg by mouth 3 (three) times daily as needed. 06/03/20   [provider]    Physical Exam: Vitals:   11/08/21 1845 11/08/21 1900 11/08/21 1951 11/08/21 2046  BP: (!) 133/95 (!) 137/92 106/63 112/66   Pulse: 88 92 85   Resp: 18 18 16 17   Temp:   98.4 F (36.9 C) 98.6 F (37 C)  TempSrc:   Oral Oral  SpO2: 98% 98% 99% 99%  Weight:    107.7 kg  Height:    5\' 7"  (1.702 m)    Physical Exam Vitals reviewed.  Constitutional:      General: She is not in acute distress. HENT:     Head: Normocephalic and atraumatic.  Eyes:     Extraocular Movements: Extraocular movements intact.     Comments: Left eye vision loss  Cardiovascular:     Rate and Rhythm: Normal rate and regular rhythm.     Pulses: Normal pulses.  Pulmonary:     Effort: Pulmonary effort is normal. No respiratory distress.     Breath sounds: No rales.     Comments: Mild scattered wheezing Abdominal:     General: Bowel sounds are normal. There is no distension.     Palpations: Abdomen is soft.     Tenderness: There is no abdominal tenderness.  Musculoskeletal:        General: No swelling or tenderness.     Cervical back: Normal range of motion.  Skin:    General: Skin is warm and dry.  Neurological:     Mental Status: She is alert and oriented to person, place, and time.     Sensory: No sensory deficit.     Motor: No weakness.     Comments: Speech fluent, tongue midline, no facial droop     Labs on Admission: I have personally reviewed following labs and imaging studies  CBC: Recent Labs  Lab 11/08/21 1238  WBC 4.5  NEUTROABS 2.5  HGB 11.9*  HCT 36.8  MCV 89.5  PLT 488   Basic Metabolic Panel: Recent Labs  Lab 11/08/21 1238  NA 139  K 4.0  CL 103  CO2 29  GLUCOSE 99  BUN 12  CREATININE 0.78  CALCIUM 9.6   GFR: Estimated Creatinine Clearance: 100.3 mL/min (by C-G formula based on SCr of 0.78 mg/dL). Liver Function Tests: Recent Labs  Lab 11/08/21 1238  AST 13*  ALT 10  ALKPHOS 62  BILITOT 0.6  PROT 7.1  ALBUMIN 4.2   No results for input(s): "LIPASE", "AMYLASE" in the last 168 hours. No results for input(s): "AMMONIA" in the last 168 hours. Coagulation Profile: No results for  input(s): "INR", "PROTIME" in the last 168 hours. Cardiac Enzymes: No results for input(s): "CKTOTAL", "CKMB", "CKMBINDEX", "TROPONINI" in the last 168 hours. BNP (last 3 results) No results for input(s): "PROBNP" in the last 8760 hours. HbA1C: No results for input(s): "  HGBA1C" in the last 72 hours. CBG: No results for input(s): "GLUCAP" in the last 168 hours. Lipid Profile: No results for input(s): "CHOL", "HDL", "LDLCALC", "TRIG", "CHOLHDL", "LDLDIRECT" in the last 72 hours. Thyroid Function Tests: No results for input(s): "TSH", "T4TOTAL", "FREET4", "T3FREE", "THYROIDAB" in the last 72 hours. Anemia Panel: No results for input(s): "VITAMINB12", "FOLATE", "FERRITIN", "TIBC", "IRON", "RETICCTPCT" in the last 72 hours. Urine analysis:    Component Value Date/Time   COLORURINE YELLOW 11/08/2021 1238   APPEARANCEUR HAZY (A) 11/08/2021 1238   LABSPEC 1.020 11/08/2021 1238   PHURINE 5.5 11/08/2021 1238   GLUCOSEU NEGATIVE 11/08/2021 1238   HGBUR NEGATIVE 11/08/2021 [place] 11/08/2021 1238   KETONESUR NEGATIVE 11/08/2021 1238   PROTEINUR NEGATIVE 11/08/2021 1238   UROBILINOGEN 1.0 04/08/2014 1729   NITRITE NEGATIVE 11/08/2021 1238   LEUKOCYTESUR MODERATE (A) 11/08/2021 1238    Radiological Exams on Admission: CT ANGIO HEAD NECK W WO CM  Result Date: 11/08/2021 CLINICAL DATA:  Neuro deficit, acute, stroke suspected. Additional history provided: Sudden loss of vision in left eye. EXAM: CT ANGIOGRAPHY HEAD AND NECK TECHNIQUE: Multidetector CT imaging of the head and neck was performed using the standard protocol during bolus administration of intravenous contrast. Multiplanar CT image reconstructions and MIPs were obtained to evaluate the vascular anatomy. Carotid stenosis measurements (when applicable) are obtained utilizing NASCET criteria, using the distal internal carotid diameter as the denominator. RADIATION DOSE REDUCTION: This exam was performed according to the  departmental dose-optimization program which includes automated exposure control, adjustment of the mA and/or kV according to patient size and/or use of iterative reconstruction technique. CONTRAST:  129mL OMNIPAQUE IOHEXOL 350 MG/ML SOLN COMPARISON:  Head CT 09/26/2011. FINDINGS: CT HEAD FINDINGS Brain: Generalized cerebral and cerebellar atrophy. Chronic lacunar infarct versus prominent perivascular space within the left basal ganglia. Chronic lacunar infarct within the right basal ganglia. Chronic lacunar infarct within the right thalamus. Chronic infarct within the central pons. Background mild patchy and ill-defined hypoattenuation within the cerebral white matter, nonspecific but compatible with chronic small vessel image disease. There is no acute intracranial hemorrhage. No demarcated cortical infarct. No extra-axial fluid collection. No evidence of an intracranial mass. No midline shift. Vascular: No hyperdense vessel.  Atherosclerotic calcifications. Skull: No fracture or aggressive osseous lesion. Sinuses/Orbits: No mass or acute finding within the imaged orbits. Minimal mucosal thickening within the left maxillary sinus. Review of the MIP images confirms the above findings CTA NECK FINDINGS Motion degraded examination. Aortic arch: Common origin of the innominate and left common carotid arteries. Atherosclerotic plaque within the visualized aortic arch and proximal major branch vessels of the neck. Streak and beam hardening artifact arising from a dense left-sided contrast bolus partially obscures the left subclavian artery. Within this limitation, there is no appreciable hemodynamically significant innominate or proximal subclavian artery stenosis. Right carotid system: CCA and ICA patent within the neck without hemodynamically significant stenosis (50% or greater). Partially retropharyngeal course of the cervical ICA. Left carotid system: CCA and ICA patent within the neck without hemodynamically  significant stenosis (50% or greater). Mild atherosclerotic plaque about the carotid bifurcation and within the proximal ICA. Partially retropharyngeal course of the cervical ICA. Vertebral arteries: Venous reflux from a left-sided contrast bolus partially obscures the V1 and proximal V2 left vertebral artery. Within this limitation, the vertebral arteries are patent within the neck without appreciable hemodynamically significant stenosis. Skeleton: No acute fracture or aggressive osseous lesion. Other neck: No neck mass or cervical lymphadenopathy. Upper chest:  Suspected incompletely imaged pulmonary nodule within the right upper lobe, measuring at least 8 mm (series 11, image 303). Review of the MIP images confirms the above findings CTA HEAD FINDINGS Anterior circulation: ICA atherosclerotic plaque within both vessels. Most notably, there is up to moderate stenosis within the right cavernous segment and apparent moderate to severe stenosis within the left cavernous segment. The M1 middle cerebral arteries are patent. No M2 proximal branch occlusion or high-grade proximal stenosis. The anterior cerebral arteries are patent. Atherosclerotic irregularity of both anterior cerebral arteries without high-grade proximal stenosis. No intracranial aneurysm is identified. Posterior circulation: The intracranial vertebral arteries are patent. The basilar artery is patent. The posterior cerebral arteries are patent. Atherosclerotic irregularity of both vessels. Most notably, there is a moderate stenosis within the left P2 segment, severe stenosis within a left PCA branch at the P2/P3 junction, and severe stenosis within the right PCA branch at the P2/P3 junction. Venous sinuses: Within the limitations of contrast timing, no convincing thrombus. Anatomic variants: As described. Review of the MIP images confirms the above findings IMPRESSION: CT head: 1. No evidence of acute intracranial abnormality. 2. Parenchymal atrophy and  progressive chronic small-vessel ischemic disease with chronic lacunar infarcts as described. CTA neck: 1. The common carotid and internal carotid arteries are patent within the neck without hemodynamically significant stenosis. Mild atherosclerotic plaque about the left carotid bifurcation and within the proximal left ICA. 2. Venous reflux from a left-sided contrast bolus partially obscures the V1 and proximal V2 left vertebral artery. Within this limitation, the cervical vertebral arteries are patent without hemodynamically significant stenosis. 3. Apparent partially imaged right upper lobe pulmonary nodule, measuring at least 8 mm, and concerning for possible pulmonary neoplasm. A chest CT is recommended for further evaluation. CTA head: 1. No intracranial large vessel occlusion is identified. 2. Intracranial atherosclerotic disease with multifocal stenoses, most notably as follows. 3. Moderate stenosis within the cavernous right ICA. 4. Moderate to severe stenosis within the cavernous left ICA. 5. Moderate stenosis within the left PCA P2 segment. 6. Severe stenoses within PCA branches at the P2/P3 junction, bilaterally. Electronically Signed   By: Kellie Simmering D.O.   On: 11/08/2021 15:18    EKG: Independently reviewed.  Sinus rhythm.  New T wave inversions in inferolateral leads and ST elevation in V2.  Assessment and Plan  Left eye vision loss Symptoms started 6 weeks ago.  CTA head and neck concerning for stenosis.  -I have notified neurology of patient's arrival to [place] monitoring -MRI of brain and orbits with and without contrast -Antiplatelet therapy per neurology recommendations -A1c, lipid panel -Echocardiogram -Frequent neurochecks -PT, OT, speech therapy.  Pulmonary nodule Patient with history of lung cancer treated many years ago.  She continues to smoke 2 to 3 cigarettes daily.  CTA neck showing partially imaged right upper lobe pulmonary nodule measuring  at least 8 mm concerning for possible pulmonary neoplasm. -Imaging findings discussed with the patient and CT chest ordered for further evaluation. -Patient has been counseled to stop smoking cigarettes.  EKG changes History of CAD EKG showing new T wave inversions in inferolateral leads and ST elevation in V2.  Last EKG in the chart is from over 3 years ago.  ACS less likely as patient is not endorsing chest pain and appears comfortable. Cath done in 2016 showing moderate diffuse mid RCA, eccentric moderate PDA, and less than 70% obstruction in the ostium of the small first obtuse marginal. -Check troponin -Echocardiogram  Mild  normocytic anemia No signs of active bleeding.  Patient is not endorsing any symptoms of GI bleed. -Anemia panel  Abnormal urinalysis UA showing negative nitrite, moderate leukocytes, and microscopy showing 11-20 WBCs, and rare bacteria.  Patient is not endorsing urinary symptoms.  No fever or leukocytosis. -Add on urine culture  Asthma Has mild scattered wheezing on exam but otherwise comfortable and satting well on room air. -Albuterol neb as needed -Pharmacy med rec pending.  Hypertension Currently normotensive. -Allow permissive hypertension at this time, pending neurology evaluation  Anxiety Hyperlipidemia -Pharmacy med rec pending.   DVT prophylaxis: Lovenox Code Status: Full Code (discussed with the patient) Family Communication: Patient's daughter at bedside with her fianc. Consults called: Neurology (Dr. Leonel Ramsay) Level of care: Telemetry bed Admission status: It is my clinical opinion that referral for OBSERVATION is reasonable and necessary in this patient based on the above information provided. The aforementioned taken together are felt to place the patient at high risk for further clinical deterioration. However, it is anticipated that the patient may be medically stable for discharge from the hospital within 24 to 48 hours.   Shela Leff MD [facility]  If 7PM-7AM, please contact night-coverage [email or web address]  11/08/2021, 9:03 PM

## 2021-11-08 NOTE — ED Notes (Signed)
Patient transported to CT 

## 2021-11-08 NOTE — ED Triage Notes (Signed)
Pt went to eye dr due to sudden loss of vision in left eye, eye dr stated she  had a stroke at some time and needed to be evaluated. Still severe decreased vision in left eye. 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

## 2021-11-08 NOTE — ED Notes (Signed)
Attempted to call report, phone rang with no answer.

## 2021-11-08 NOTE — Progress Notes (Signed)
Patient arrived to the floor via EMS, ambulated to the bed, refuses skin assessment, but did her own CHG bath. Stand up scale obtained. Patient is NPO , but she insiste that she hasn't eat all day and she will eat.

## 2021-11-08 NOTE — ED Triage Notes (Signed)
Pt also states that her right arm is numb intermittently at times for about a month.

## 2021-11-09 ENCOUNTER — Observation Stay (HOSPITAL_COMMUNITY): Payer: Medicaid Other

## 2021-11-09 ENCOUNTER — Encounter (HOSPITAL_COMMUNITY): Payer: Self-pay

## 2021-11-09 ENCOUNTER — Observation Stay (HOSPITAL_BASED_OUTPATIENT_CLINIC_OR_DEPARTMENT_OTHER): Payer: Medicaid Other

## 2021-11-09 DIAGNOSIS — I6389 Other cerebral infarction: Secondary | ICD-10-CM | POA: Diagnosis not present

## 2021-11-09 DIAGNOSIS — E782 Mixed hyperlipidemia: Secondary | ICD-10-CM | POA: Diagnosis not present

## 2021-11-09 DIAGNOSIS — H5462 Unqualified visual loss, left eye, normal vision right eye: Secondary | ICD-10-CM | POA: Diagnosis not present

## 2021-11-09 LAB — ECHOCARDIOGRAM COMPLETE
AR max vel: 0.62 cm2
AV Area VTI: 0.52 cm2
AV Area mean vel: 0.51 cm2
AV Mean grad: 59 mmHg
AV Peak grad: 92.2 mmHg
Ao pk vel: 4.8 m/s
Area-P 1/2: 2.11 cm2
Height: 67 in
S' Lateral: 2.8 cm
Weight: 3800 oz

## 2021-11-09 LAB — IRON AND TIBC
Iron: 94 ug/dL (ref 28–170)
Saturation Ratios: 25 % (ref 10.4–31.8)
TIBC: 379 ug/dL (ref 250–450)
UIBC: 285 ug/dL

## 2021-11-09 LAB — LIPID PANEL
Cholesterol: 237 mg/dL — ABNORMAL HIGH (ref 0–200)
HDL: 39 mg/dL — ABNORMAL LOW (ref >40)
LDL Cholesterol: 182 mg/dL — ABNORMAL HIGH (ref 0–99)
Total CHOL/HDL Ratio: 6.1 RATIO
Triglycerides: 78 mg/dL (ref <150)
VLDL: 16 mg/dL (ref 0–40)

## 2021-11-09 LAB — HIV ANTIBODY (ROUTINE TESTING W REFLEX): HIV Screen 4th Generation wRfx: NONREACTIVE

## 2021-11-09 LAB — TROPONIN I (HIGH SENSITIVITY): Troponin I (High Sensitivity): 12 ng/L (ref <18)

## 2021-11-09 LAB — FERRITIN: Ferritin: 53 ng/mL (ref 11–307)

## 2021-11-09 LAB — FOLATE: Folate: 13.9 ng/mL (ref >5.9)

## 2021-11-09 LAB — VITAMIN B12: Vitamin B-12: 231 pg/mL (ref 180–914)

## 2021-11-09 MED ORDER — ROSUVASTATIN CALCIUM 20 MG PO TABS
40.0000 mg | ORAL_TABLET | Freq: Every day | ORAL | Status: DC
  Administered 2021-11-09 – 2021-11-10 (×2): 40 mg via ORAL
  Filled 2021-11-09 (×2): qty 2

## 2021-11-09 MED ORDER — GADOPICLENOL 0.5 MMOL/ML IV SOLN
10.0000 mL | Freq: Once | INTRAVENOUS | Status: AC | PRN
  Administered 2021-11-09: 10 mL via INTRAVENOUS

## 2021-11-09 MED ORDER — LORAZEPAM 2 MG/ML IJ SOLN
0.5000 mg | Freq: Once | INTRAMUSCULAR | Status: AC | PRN
  Administered 2021-11-09: 0.5 mg via INTRAVENOUS
  Filled 2021-11-09: qty 1

## 2021-11-09 MED ORDER — CLOPIDOGREL BISULFATE 75 MG PO TABS
75.0000 mg | ORAL_TABLET | Freq: Every day | ORAL | Status: DC
  Administered 2021-11-09 – 2021-11-10 (×2): 75 mg via ORAL
  Filled 2021-11-09 (×2): qty 1

## 2021-11-09 MED ORDER — LORAZEPAM 2 MG/ML IJ SOLN
0.5000 mg | Freq: Once | INTRAMUSCULAR | Status: DC
  Filled 2021-11-09: qty 1

## 2021-11-09 MED ORDER — ASPIRIN 325 MG PO TBEC
325.0000 mg | DELAYED_RELEASE_TABLET | Freq: Every day | ORAL | Status: DC
  Administered 2021-11-09 – 2021-11-10 (×2): 325 mg via ORAL
  Filled 2021-11-09 (×2): qty 1

## 2021-11-09 NOTE — Progress Notes (Signed)
Patient refuses the Lovenox, and called her daughter who was cursing out on the speaker phone about giving patient the Lovenox  while she is already on blood thinner.

## 2021-11-09 NOTE — Plan of Care (Signed)
POC initiated and progressing. 

## 2021-11-09 NOTE — Progress Notes (Signed)
Patient returned from CT and was eating cracker when nurse went to check her vitals.MD notified.was able to swallow well

## 2021-11-09 NOTE — Progress Notes (Signed)
PROGRESS NOTE    Andrea Mullen  NFA:[id number] DOB: 1965/12/03 DOA: 11/08/2021 PCP: Nolene Ebbs, MD  Outpatient Specialists:     Brief Narrative:  As per H&P done earlier: " Andrea Mullen is a 56 y.o. female with medical history significant of anxiety, asthma, history of lung cancer status post chemo and radiation, hyperlipidemia, hypertension, CAD, anemia, polysubstance abuse (cocaine, marijuana, tobacco) presented to the ED with complaint of left eye vision loss x1 month.  Saw ophthalmology yesterday and was advised to come into the ED due to concern for stroke.  Patient presented to the ED today.  In the ED, vital signs stable.  No significant lab abnormalities except hemoglobin 11.9 with MCV 89.5 (hemoglobin was 13.1 in February 2023).  UA showing negative nitrite, moderate leukocytes, and microscopy showing 11-20 WBCs, and rare bacteria.     CTA head and neck showing: "IMPRESSION: CT head:   1. No evidence of acute intracranial abnormality. 2. Parenchymal atrophy and progressive chronic small-vessel ischemic disease with chronic lacunar infarcts as described.   CTA neck:   1. The common carotid and internal carotid arteries are patent within the neck without hemodynamically significant stenosis. Mild atherosclerotic plaque about the left carotid bifurcation and within the proximal left ICA. 2. Venous reflux from a left-sided contrast bolus partially obscures the V1 and proximal V2 left vertebral artery. Within this limitation, the cervical vertebral arteries are patent without hemodynamically significant stenosis. 3. Apparent partially imaged right upper lobe pulmonary nodule, measuring at least 8 mm, and concerning for possible pulmonary neoplasm. A chest CT is recommended for further evaluation.   CTA head:   1. No intracranial large vessel occlusion is identified. 2. Intracranial atherosclerotic disease with multifocal stenoses, most notably as follows. 3.  Moderate stenosis within the cavernous right ICA. 4. Moderate to severe stenosis within the cavernous left ICA. 5. Moderate stenosis within the left PCA P2 segment. 6. Severe stenoses within PCA branches at the P2/P3 junction, bilaterally."   ED provider discussed the case with neurology and patient was given aspirin 325 mg and Plavix 300 mg.  Neurology recommended permissive hypertension and MRI of brain and orbits with and without contrast for further evaluation.   Patient reports 6-week history of left eye vision loss.  States she was seen by her ophthalmologist 2 days ago and advised to come into the ED for further evaluation due to concern for stroke.  Patient states she does not like going to hospitals and came into the ED today.  Denies history of prior strokes.  She has also experienced intermittent left arm numbness which goes away every time she massages her armpit.  Denies any weakness.  Denies fevers, chills, cough, shortness of breath, chest pain, nausea, vomiting, abdominal pain, diarrhea, constipation, or any urinary symptoms.  Denies hematemesis, hematochezia, or melena.  She reports history of lung cancer 13 years ago and received treatment at that time.  She continues to smoke 2 to 3 cigarettes daily".  11/09/2021: Patient seen.  Neurology input is appreciated.  Patient has remained stable.  No significant new findings.  Likely discharge in the next 1 to 2 days.      Assessment & Plan:   Principal Problem:   Vision loss of left eye Active Problems:   Pulmonary nodule   Anxiety state   CAD (coronary artery disease)   Anemia   Asthma, chronic   HTN (hypertension)   HLD (hyperlipidemia)   Left eye vision loss Symptoms started 6 weeks ago.  CTA head and neck concerning for stenosis.  -I have notified neurology of patient's arrival to [facility] monitoring -MRI of brain and orbits with and without contrast -Antiplatelet therapy per neurology  recommendations -A1c, lipid panel -Echocardiogram -Frequent neurochecks -PT, OT, speech therapy. 11/09/2021: Patient seen.  Discussed with the neurology team.  No new findings.  Follow CT chest resolved.  Likely discharge tomorrow.   Pulmonary nodule Patient with history of lung cancer treated many years ago.  She continues to smoke 2 to 3 cigarettes daily.  CTA neck showing partially imaged right upper lobe pulmonary nodule measuring at least 8 mm concerning for possible pulmonary neoplasm. -Imaging findings discussed with the patient and CT chest ordered for further evaluation. -Patient has been counseled to stop smoking cigarettes. 11/09/2021: CT chest result is pending.  Further management will depend on above.   EKG changes History of CAD EKG showing new T wave inversions in inferolateral leads and ST elevation in V2.  Last EKG in the chart is from over 3 years ago.  ACS less likely as patient is not endorsing chest pain and appears comfortable. Cath done in 2016 showing moderate diffuse mid RCA, eccentric moderate PDA, and less than 70% obstruction in the ostium of the small first obtuse marginal. -Check troponin -Echocardiogram   Mild normocytic anemia No signs of active bleeding.  Patient is not endorsing any symptoms of GI bleed. -Anemia panel   Abnormal urinalysis UA showing negative nitrite, moderate leukocytes, and microscopy showing 11-20 WBCs, and rare bacteria.  Patient is not endorsing urinary symptoms.  No fever or leukocytosis. -Add on urine culture   Asthma Has mild scattered wheezing on exam but otherwise comfortable and satting well on room air. -Albuterol neb as needed -Pharmacy med rec pending. 11/09/2021: Stable.  No wheezes.   Hypertension Currently normotensive. -Allow permissive hypertension at this time, pending neurology evaluation   Anxiety Hyperlipidemia -Pharmacy med rec pending.   Severe aortic stenosis: -Follow-up with cardiology.   DVT  prophylaxis: Subcutaneous Lovenox 40 Mg once daily. Code Status: Full code Family Communication:  Disposition Plan: Likely home with PT OT tomorrow.   Consultants:  Neurology  Procedures:  None  Antimicrobials:  None   Subjective: No new complaints.  Objective: Vitals:   11/08/21 2046 11/09/21 0836 11/09/21 1306 11/09/21 1601  BP: 112/66 119/84  (!) 121/58  Pulse:  87 84 85  Resp: 17 16  16   Temp: 98.6 F (37 C) 98.4 F (36.9 C) 97.8 F (36.6 C) 98.3 F (36.8 C)  TempSrc: Oral Oral Oral Oral  SpO2: 99% 100% 92% 94%  Weight: 107.7 kg     Height: 5\' 7"  (1.702 m)      No intake or output data in the 24 hours ending 11/09/21 1706 Filed Weights   11/08/21 2046  Weight: 107.7 kg    Examination:  General exam: Appears calm and comfortable  Respiratory system: Clear to auscultation.  Cardiovascular system: S1 & S2, with systolic murmur (ESM).   Gastrointestinal system: Abdomen is obese, soft and nontender.   Central nervous system: Alert and oriented.  Patient moves all extremities.  Patient is not very compliant with physical examination. Extremities: No leg edema.  Data Reviewed: I have personally reviewed following labs and imaging studies  CBC: Recent Labs  Lab 11/08/21 1238  WBC 4.5  NEUTROABS 2.5  HGB 11.9*  HCT 36.8  MCV 89.5  PLT 782   Basic Metabolic Panel: Recent Labs  Lab 11/08/21 1238  NA 139  K 4.0  CL 103  CO2 29  GLUCOSE 99  BUN 12  CREATININE 0.78  CALCIUM 9.6   GFR: Estimated Creatinine Clearance: 100.3 mL/min (by C-G formula based on SCr of 0.78 mg/dL). Liver Function Tests: Recent Labs  Lab 11/08/21 1238  AST 13*  ALT 10  ALKPHOS 62  BILITOT 0.6  PROT 7.1  ALBUMIN 4.2   No results for input(s): "LIPASE", "AMYLASE" in the last 168 hours. No results for input(s): "AMMONIA" in the last 168 hours. Coagulation Profile: No results for input(s): "INR", "PROTIME" in the last 168 hours. Cardiac Enzymes: No results for  input(s): "CKTOTAL", "CKMB", "CKMBINDEX", "TROPONINI" in the last 168 hours. BNP (last 3 results) No results for input(s): "PROBNP" in the last 8760 hours. HbA1C: Recent Labs    11/08/21 2307  HGBA1C 5.3   CBG: No results for input(s): "GLUCAP" in the last 168 hours. Lipid Profile: Recent Labs    11/08/21 2307  CHOL 237*  HDL 39*  LDLCALC 182*  TRIG 78  CHOLHDL 6.1   Thyroid Function Tests: No results for input(s): "TSH", "T4TOTAL", "FREET4", "T3FREE", "THYROIDAB" in the last 72 hours. Anemia Panel: Recent Labs    11/08/21 2307  VITAMINB12 231  FOLATE 13.9  FERRITIN 53  TIBC 379  IRON 94  RETICCTPCT 1.6   Urine analysis:    Component Value Date/Time   COLORURINE YELLOW 11/08/2021 1238   APPEARANCEUR HAZY (A) 11/08/2021 1238   LABSPEC 1.020 11/08/2021 1238   PHURINE 5.5 11/08/2021 1238   GLUCOSEU NEGATIVE 11/08/2021 1238   HGBUR NEGATIVE 11/08/2021 1238   BILIRUBINUR NEGATIVE 11/08/2021 1238   KETONESUR NEGATIVE 11/08/2021 1238   PROTEINUR NEGATIVE 11/08/2021 1238   UROBILINOGEN 1.0 04/08/2014 1729   NITRITE NEGATIVE 11/08/2021 1238   LEUKOCYTESUR MODERATE (A) 11/08/2021 1238   Sepsis Labs: @LABRCNTIP (procalcitonin:4,lacticidven:4)  ) Recent Results (from the past 240 hour(s))  Resp Panel by RT-PCR (Flu A&B, Covid) Anterior Nasal Swab     Status: None   Collection Time: 11/08/21 12:38 PM   Specimen: Anterior Nasal Swab  Result Value Ref Range Status   SARS Coronavirus 2 by RT PCR NEGATIVE NEGATIVE Final    Comment: (NOTE) SARS-CoV-2 target nucleic acids are NOT DETECTED.  The SARS-CoV-2 RNA is generally detectable in upper respiratory specimens during the acute phase of infection. The lowest concentration of SARS-CoV-2 viral copies this assay can detect is 138 copies/mL. A negative result does not preclude SARS-Cov-2 infection and should not be used as the sole basis for treatment or other patient management decisions. A negative result may occur  with  improper specimen collection/handling, submission of specimen other than nasopharyngeal swab, presence of viral mutation(s) within the areas targeted by this assay, and inadequate number of viral copies(<138 copies/mL). A negative result must be combined with clinical observations, patient history, and epidemiological information. The expected result is Negative.  Fact Sheet for Patients:  EntrepreneurPulse.com.au  Fact Sheet for Healthcare Providers:  IncredibleEmployment.be  This test is no t yet approved or cleared by the [place] FDA and  has been authorized for detection and/or diagnosis of SARS-CoV-2 by FDA under an Emergency Use Authorization (EUA). This EUA will remain  in effect (meaning this test can be used) for the duration of the COVID-19 declaration under Section 564(b)(1) of the Act, 21 U.S.C.section 360bbb-3(b)(1), unless the authorization is terminated  or revoked sooner.       Influenza A by PCR NEGATIVE NEGATIVE Final   Influenza B by PCR  NEGATIVE NEGATIVE Final    Comment: (NOTE) The Xpert Xpress SARS-CoV-2/FLU/RSV plus assay is intended as an aid in the diagnosis of influenza from Nasopharyngeal swab specimens and should not be used as a sole basis for treatment. Nasal washings and aspirates are unacceptable for Xpert Xpress SARS-CoV-2/FLU/RSV testing.  Fact Sheet for Patients: EntrepreneurPulse.com.au  Fact Sheet for Healthcare Providers: IncredibleEmployment.be  This test is not yet approved or cleared by the [place] FDA and has been authorized for detection and/or diagnosis of SARS-CoV-2 by FDA under an Emergency Use Authorization (EUA). This EUA will remain in effect (meaning this test can be used) for the duration of the COVID-19 declaration under Section 564(b)(1) of the Act, 21 U.S.C. section 360bbb-3(b)(1), unless the authorization is terminated  or revoked.  Performed at [facility], [address], [place], [place] [address]          Radiology Studies: ECHOCARDIOGRAM COMPLETE  Result Date: 11/09/2021    ECHOCARDIOGRAM REPORT   Patient Name:   Andrea Mullen Date of Exam: 11/09/2021 Medical Rec #:  [id number]        Height:       67.0 in Accession #:    [id number]       Weight:       237.5 lb Date of Birth:  Sep 28, 1965        BSA:          2.175 m Patient Age:    62 years         BP:           119/84 mmHg Patient Gender: F                HR:           87 bpm. Exam Location:  Inpatient Procedure: 2D Echo, Cardiac Doppler and Color Doppler Indications:    I63.9 Stroke  History:        Patient has prior history of Echocardiogram examinations, most                 recent 10/31/2014. CAD and Previous Myocardial Infarction; Risk                 Factors:Hypertension and Dyslipidemia. Polysubstance abuse                 ([place]), Polysubstance abuse & Smoker from Hx. Obesity.  Sonographer:    Alvino Chapel RCS Referring Phys: [id number] [place]  1. Left ventricular ejection fraction, by estimation, is 60 to 65%. The left ventricle has normal function. The left ventricle has no regional wall motion abnormalities. There is moderate left ventricular hypertrophy. Left ventricular diastolic parameters are consistent with Grade I diastolic dysfunction (impaired relaxation).  2. Right ventricular systolic function is low normal. The right ventricular size is normal.  3. Left atrial size was mildly dilated.  4. The mitral valve is abnormal. Trivial mitral valve regurgitation. No evidence of mitral stenosis. Moderate mitral annular calcification.  5. The aortic valve has an indeterminant number of cusps and is heavily calcified, possibly bicuspid. Aortic valve regurgitation is trivial. Severe aortic valve stenosis. Aortic valve area, by VTI measures 0.52 cm. Aortic valve mean gradient measures 59.0 mmHg. Aortic valve Vmax  measures 4.80 m/s. Peak gradient 92 mmHg. DI is 0.15. Comparison(s): Changes from prior study are noted. 10/31/2014: LVEF 55-60%, mild AI. FINDINGS  Left Ventricle: Left ventricular ejection fraction, by estimation, is 60 to 65%. The left ventricle has normal function. The  left ventricle has no regional wall motion abnormalities. The left ventricular internal cavity size was normal in size. There is  moderate left ventricular hypertrophy. Left ventricular diastolic parameters are consistent with Grade I diastolic dysfunction (impaired relaxation). Normal left ventricular filling pressure. Right Ventricle: The right ventricular size is normal. No increase in right ventricular wall thickness. Right ventricular systolic function is low normal. Left Atrium: Left atrial size was mildly dilated. Right Atrium: Right atrial size was normal in size. Pericardium: There is no evidence of pericardial effusion. Mitral Valve: The mitral valve is abnormal. Moderate mitral annular calcification. Trivial mitral valve regurgitation. No evidence of mitral valve stenosis. Tricuspid Valve: The tricuspid valve is grossly normal. Tricuspid valve regurgitation is not demonstrated. Aortic Valve: The aortic valve has an indeterminant number of cusps. Aortic valve regurgitation is trivial. Severe aortic stenosis is present. Aortic valve mean gradient measures 59.0 mmHg. Aortic valve peak gradient measures 92.2 mmHg. Aortic valve area, by VTI measures 0.52 cm. Pulmonic Valve: The pulmonic valve was normal in structure. Pulmonic valve regurgitation is not visualized. Aorta: The aortic root is normal in size and structure. Venous: The inferior vena cava was not well visualized. IAS/Shunts: No atrial level shunt detected by color flow Doppler.  LEFT VENTRICLE PLAX 2D LVIDd:         4.00 cm   Diastology LVIDs:         2.80 cm   LV e' medial:    4.03 cm/s LV PW:         1.40 cm   LV E/e' medial:  39.2 LV IVS:        1.50 cm   LV e' lateral:   6.31  cm/s LVOT diam:     2.10 cm   LV E/e' lateral: 25.0 LV SV:         59 LV SV Index:   27 LVOT Area:     3.46 cm  RIGHT VENTRICLE RV S prime:     10.20 cm/s TAPSE (M-mode): 1.8 cm LEFT ATRIUM             Index        RIGHT ATRIUM           Index LA diam:        3.00 cm 1.38 cm/m   RA Area:     17.50 cm LA Vol (A2C):   90.7 ml 41.69 ml/m  RA Volume:   48.40 ml  22.25 ml/m LA Vol (A4C):   69.3 ml 31.86 ml/m LA Biplane Vol: 79.9 ml 36.73 ml/m  AORTIC VALVE AV Area (Vmax):    0.62 cm AV Area (Vmean):   0.51 cm AV Area (VTI):     0.52 cm AV Vmax:           480.00 cm/s AV Vmean:          371.000 cm/s AV VTI:            1.130 m AV Peak Grad:      92.2 mmHg AV Mean Grad:      59.0 mmHg LVOT Vmax:         85.50 cm/s LVOT Vmean:        55.100 cm/s LVOT VTI:          0.170 m LVOT/AV VTI ratio: 0.15  AORTA Ao Root diam: 3.00 cm MITRAL VALVE MV Area (PHT): 2.11 cm     SHUNTS MV Decel Time: 359 msec     Systemic VTI:  0.17 m MV E velocity: 158.00 cm/s  Systemic Diam: 2.10 cm MV A velocity: 152.00 cm/s MV E/A ratio:  1.04 Lyman Bishop MD Electronically signed by Lyman Bishop MD Signature Date/Time: 11/09/2021/4:11:06 PM    Final    MR ORBITS W WO CONTRAST  Result Date: 11/09/2021 CLINICAL DATA:  Provided history: Vision loss, monocular. EXAM: MRI HEAD AND ORBITS WITHOUT AND WITH CONTRAST TECHNIQUE: Multiplanar, multiecho pulse sequences of the brain and surrounding structures were obtained without and with intravenous contrast. Multiplanar, multiecho pulse sequences of the orbits and surrounding structures were obtained including fat saturation techniques, before and after intravenous contrast administration. CONTRAST:  10 mL Vueway intravenous contrast. COMPARISON:  Noncontrast head CT and CT angiogram head/neck 11/08/2021. FINDINGS: Intermittently motion degraded examination, somewhat limiting evaluation. Most notably, there is moderate motion degradation of the sagittal T1 weighted sequence, moderate to severe  motion degradation of the coronal T1 weighted postcontrast sequence and severe motion degradation of the sagittal T1 weighted postcontrast sequence. Within this limitation, findings are as follows. MRI HEAD FINDINGS Brain: Generalized cerebral and cerebellar atrophy. Chronic hemorrhagic infarct within the right corona radiata/basal ganglia. Chronic small-vessel infarct within the left corona radiata/basal ganglia. Chronic lacunar infarcts within the right thalamus and within the central pons. Background mild-to-moderate multifocal T2 FLAIR hyperintense signal abnormality within the cerebral white matter, nonspecific but compatible with chronic small vessel disease. Several scattered chronic microhemorrhages within the supratentorial brain. Numerous tiny chronic infarcts within both cerebellar hemispheres. There is no acute infarct. No evidence of an intracranial mass. No extra-axial fluid collection. No midline shift. No pathologic intracranial enhancement identified. Vascular: Maintained flow voids within the proximal large arterial vessels. Skull and upper cervical spine: No focal suspicious marrow lesion. MRI ORBITS FINDINGS Mild intermittent motion degradation. Orbits: The globes are normal in size and contour. Portions of the intraorbital left optic nerve appear atrophic posteriorly (for instance as seen on series 11, images 12-14). The extraocular muscles and lateral glands are symmetric and unremarkable. No intraorbital mass or pathologic intraorbital enhancement. Visualized sinuses: Mild paranasal sinus mucosal thickening, greatest within the bilateral ethmoid and left maxillary sinuses. Soft tissues: Unremarkable appearance of the visualized maxillofacial and upper neck soft tissues. IMPRESSION: MRI brain: 1. Motion degraded examination. 2. No evidence of acute intracranial abnormality. 3. Parenchymal atrophy, chronic small vessel disease and chronic infarcts as detailed. 4. Several chronic parenchymal  microhemorrhages within the supratentorial brain. 5. Numerous tiny chronic infarcts within bilateral cerebellar hemispheres. MRI Orbits: 1. Mildly motion degraded exam. 2. Portions of the intraorbital left optic nerve appear atrophic. 3. No intraorbital mass or pathologic or intraorbital enhancement. 4. Mild paranasal sinus mucosal thickening. Electronically Signed   By: Kellie Simmering D.O.   On: 11/09/2021 12:38   MR BRAIN W WO CONTRAST  Result Date: 11/09/2021 CLINICAL DATA:  Provided history: Vision loss, monocular. EXAM: MRI HEAD AND ORBITS WITHOUT AND WITH CONTRAST TECHNIQUE: Multiplanar, multiecho pulse sequences of the brain and surrounding structures were obtained without and with intravenous contrast. Multiplanar, multiecho pulse sequences of the orbits and surrounding structures were obtained including fat saturation techniques, before and after intravenous contrast administration. CONTRAST:  10 mL Vueway intravenous contrast. COMPARISON:  Noncontrast head CT and CT angiogram head/neck 11/08/2021. FINDINGS: Intermittently motion degraded examination, somewhat limiting evaluation. Most notably, there is moderate motion degradation of the sagittal T1 weighted sequence, moderate to severe motion degradation of the coronal T1 weighted postcontrast sequence and severe motion degradation of the sagittal T1 weighted postcontrast sequence. Within this limitation,  findings are as follows. MRI HEAD FINDINGS Brain: Generalized cerebral and cerebellar atrophy. Chronic hemorrhagic infarct within the right corona radiata/basal ganglia. Chronic small-vessel infarct within the left corona radiata/basal ganglia. Chronic lacunar infarcts within the right thalamus and within the central pons. Background mild-to-moderate multifocal T2 FLAIR hyperintense signal abnormality within the cerebral white matter, nonspecific but compatible with chronic small vessel disease. Several scattered chronic microhemorrhages within the  supratentorial brain. Numerous tiny chronic infarcts within both cerebellar hemispheres. There is no acute infarct. No evidence of an intracranial mass. No extra-axial fluid collection. No midline shift. No pathologic intracranial enhancement identified. Vascular: Maintained flow voids within the proximal large arterial vessels. Skull and upper cervical spine: No focal suspicious marrow lesion. MRI ORBITS FINDINGS Mild intermittent motion degradation. Orbits: The globes are normal in size and contour. Portions of the intraorbital left optic nerve appear atrophic posteriorly (for instance as seen on series 11, images 12-14). The extraocular muscles and lateral glands are symmetric and unremarkable. No intraorbital mass or pathologic intraorbital enhancement. Visualized sinuses: Mild paranasal sinus mucosal thickening, greatest within the bilateral ethmoid and left maxillary sinuses. Soft tissues: Unremarkable appearance of the visualized maxillofacial and upper neck soft tissues. IMPRESSION: MRI brain: 1. Motion degraded examination. 2. No evidence of acute intracranial abnormality. 3. Parenchymal atrophy, chronic small vessel disease and chronic infarcts as detailed. 4. Several chronic parenchymal microhemorrhages within the supratentorial brain. 5. Numerous tiny chronic infarcts within bilateral cerebellar hemispheres. MRI Orbits: 1. Mildly motion degraded exam. 2. Portions of the intraorbital left optic nerve appear atrophic. 3. No intraorbital mass or pathologic or intraorbital enhancement. 4. Mild paranasal sinus mucosal thickening. Electronically Signed   By: Kellie Simmering D.O.   On: 11/09/2021 12:38   CT ANGIO HEAD NECK W WO CM  Result Date: 11/08/2021 CLINICAL DATA:  Neuro deficit, acute, stroke suspected. Additional history provided: Sudden loss of vision in left eye. EXAM: CT ANGIOGRAPHY HEAD AND NECK TECHNIQUE: Multidetector CT imaging of the head and neck was performed using the standard protocol  during bolus administration of intravenous contrast. Multiplanar CT image reconstructions and MIPs were obtained to evaluate the vascular anatomy. Carotid stenosis measurements (when applicable) are obtained utilizing NASCET criteria, using the distal internal carotid diameter as the denominator. RADIATION DOSE REDUCTION: This exam was performed according to the departmental dose-optimization program which includes automated exposure control, adjustment of the mA and/or kV according to patient size and/or use of iterative reconstruction technique. CONTRAST:  178mL OMNIPAQUE IOHEXOL 350 MG/ML SOLN COMPARISON:  Head CT 09/26/2011. FINDINGS: CT HEAD FINDINGS Brain: Generalized cerebral and cerebellar atrophy. Chronic lacunar infarct versus prominent perivascular space within the left basal ganglia. Chronic lacunar infarct within the right basal ganglia. Chronic lacunar infarct within the right thalamus. Chronic infarct within the central pons. Background mild patchy and ill-defined hypoattenuation within the cerebral white matter, nonspecific but compatible with chronic small vessel image disease. There is no acute intracranial hemorrhage. No demarcated cortical infarct. No extra-axial fluid collection. No evidence of an intracranial mass. No midline shift. Vascular: No hyperdense vessel.  Atherosclerotic calcifications. Skull: No fracture or aggressive osseous lesion. Sinuses/Orbits: No mass or acute finding within the imaged orbits. Minimal mucosal thickening within the left maxillary sinus. Review of the MIP images confirms the above findings CTA NECK FINDINGS Motion degraded examination. Aortic arch: Common origin of the innominate and left common carotid arteries. Atherosclerotic plaque within the visualized aortic arch and proximal major branch vessels of the neck. Streak and beam hardening artifact arising from  a dense left-sided contrast bolus partially obscures the left subclavian artery. Within this  limitation, there is no appreciable hemodynamically significant innominate or proximal subclavian artery stenosis. Right carotid system: CCA and ICA patent within the neck without hemodynamically significant stenosis (50% or greater). Partially retropharyngeal course of the cervical ICA. Left carotid system: CCA and ICA patent within the neck without hemodynamically significant stenosis (50% or greater). Mild atherosclerotic plaque about the carotid bifurcation and within the proximal ICA. Partially retropharyngeal course of the cervical ICA. Vertebral arteries: Venous reflux from a left-sided contrast bolus partially obscures the V1 and proximal V2 left vertebral artery. Within this limitation, the vertebral arteries are patent within the neck without appreciable hemodynamically significant stenosis. Skeleton: No acute fracture or aggressive osseous lesion. Other neck: No neck mass or cervical lymphadenopathy. Upper chest: Suspected incompletely imaged pulmonary nodule within the right upper lobe, measuring at least 8 mm (series 11, image 303). Review of the MIP images confirms the above findings CTA HEAD FINDINGS Anterior circulation: ICA atherosclerotic plaque within both vessels. Most notably, there is up to moderate stenosis within the right cavernous segment and apparent moderate to severe stenosis within the left cavernous segment. The M1 middle cerebral arteries are patent. No M2 proximal branch occlusion or high-grade proximal stenosis. The anterior cerebral arteries are patent. Atherosclerotic irregularity of both anterior cerebral arteries without high-grade proximal stenosis. No intracranial aneurysm is identified. Posterior circulation: The intracranial vertebral arteries are patent. The basilar artery is patent. The posterior cerebral arteries are patent. Atherosclerotic irregularity of both vessels. Most notably, there is a moderate stenosis within the left P2 segment, severe stenosis within a left  PCA branch at the P2/P3 junction, and severe stenosis within the right PCA branch at the P2/P3 junction. Venous sinuses: Within the limitations of contrast timing, no convincing thrombus. Anatomic variants: As described. Review of the MIP images confirms the above findings IMPRESSION: CT head: 1. No evidence of acute intracranial abnormality. 2. Parenchymal atrophy and progressive chronic small-vessel ischemic disease with chronic lacunar infarcts as described. CTA neck: 1. The common carotid and internal carotid arteries are patent within the neck without hemodynamically significant stenosis. Mild atherosclerotic plaque about the left carotid bifurcation and within the proximal left ICA. 2. Venous reflux from a left-sided contrast bolus partially obscures the V1 and proximal V2 left vertebral artery. Within this limitation, the cervical vertebral arteries are patent without hemodynamically significant stenosis. 3. Apparent partially imaged right upper lobe pulmonary nodule, measuring at least 8 mm, and concerning for possible pulmonary neoplasm. A chest CT is recommended for further evaluation. CTA head: 1. No intracranial large vessel occlusion is identified. 2. Intracranial atherosclerotic disease with multifocal stenoses, most notably as follows. 3. Moderate stenosis within the cavernous right ICA. 4. Moderate to severe stenosis within the cavernous left ICA. 5. Moderate stenosis within the left PCA P2 segment. 6. Severe stenoses within PCA branches at the P2/P3 junction, bilaterally. Electronically Signed   By: Kellie Simmering D.O.   On: 11/08/2021 15:18        Scheduled Meds:   stroke: early stages of recovery book   Does not apply Once   aspirin EC  325 mg Oral Daily   clopidogrel  75 mg Oral Daily   enoxaparin (LOVENOX) injection  40 mg Subcutaneous QHS   LORazepam  0.5 mg Intravenous Once   rosuvastatin  40 mg Oral Daily   Continuous Infusions:   LOS: 0 days    Time spent: 55  minutes.  Dana Allan, MD  [facility] Pager #: [phone] 7PM-7AM contact night coverage as above

## 2021-11-09 NOTE — Consult Note (Signed)
Neurology Consultation Reason for Consult: Stroke Referring Physician: Marlowe Sax, V  CC: Stroke  History is obtained from: Patient, family  HPI: Andrea Mullen is a 56 y.o. female with a history of hypertension and hyperlipidemia who noticed significant change in her vision of her left eye about a month ago.  She saw an eye doctor yesterday who referred her to the emergency department because of concern that this was an ischemic event to the eye.  She was initially reticent to do so, but she has been having increasing numbness of her right arm and therefore was concerned prompting her visit.  Of note, she has had a longstanding history of intermittent numbness of the right arm, which she describes as circumferential.  It comes and goes and has been going on for about 2 years.  She also has some right leg weakness that started about the same time as her right eye symptoms.   LKW: Month ago  tpa given?: no, out of window  Past Medical History:  Diagnosis Date   Anxiety    Asthma    Cancer ([facility])    lung, tx radiation and chemo   Hyperlipemia    Hypertension      Family History  Problem Relation Age of Onset   Clotting disorder Mother      Social History:  reports that she has been smoking cigarettes. She has been smoking an average of .5 packs per day. She has never used smokeless tobacco. She reports that she does not currently use alcohol. She reports that she does not currently use drugs after having used the following drugs: Marijuana and Cocaine.   Exam: Current vital signs: BP 112/66 (BP Location: Left Arm)   Pulse 85   Temp 98.6 F (37 C) (Oral)   Resp 17   Ht 5\' 7"  (1.702 m)   Wt 107.7 kg   LMP 11/16/2016   SpO2 99%   BMI 37.20 kg/m  Vital signs in last 24 hours: Temp:  [98.1 F (36.7 C)-98.6 F (37 C)] 98.6 F (37 C) (09/30 2046) Pulse Rate:  [84-96] 85 (09/30 1951) Resp:  [15-22] 17 (09/30 2046) BP: (100-137)/(63-95) 112/66 (09/30 2046) SpO2:  [98  %-100 %] 99 % (09/30 2046) Weight:  [107.7 kg] 107.7 kg (09/30 2046)   Physical Exam  Constitutional: Appears well-developed and well-nourished.  Psych: She seems irritable (?  Nicotine withdrawal)  Neuro: Mental Status: Patient is awake, alert, oriented to person, place, month, year, and situation. Patient is able to give a clear and coherent history. No signs of aphasia or neglect Cranial Nerves: II: Visual Fields are full in the right eye, little vision in the left eye.  She has significant afferent pupillary defect.   III,IV, VI: EOMI without ptosis or diploplia.  V: Facial sensation is symmetric to temperature VII: Facial movement is symmetric.  VIII: hearing is intact to voice X: Uvula elevates symmetrically XI: Shoulder shrug is symmetric. XII: tongue is midline without atrophy or fasciculations.  Motor: Tone is normal. Bulk is normal. 5/5 strength was present in bilateral arms and left leg, 4/5 in the right leg Sensory: Sensation is symmetric to light touch and temperature in the arms and legs. Cerebellar: No ataxia on finger-nose-finger  I have reviewed labs in epic and the results pertinent to this consultation are:  CMP-unremarkable  I have reviewed the images obtained: CT head-unremarkable, CTA significant intracranial carotid stenosis  Impression: 56 year old female with what is likely an embolic event to her left  eye.  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  She has not had any degree of medical management, and therefore I would favor starting antiplatelet therapy and statin.  We will also get imaging to assess for intracranial embolic events.  Recommendations: - HgbA1c, fasting lipid panel - MRI brain, MRI orbits as per ophthalmology request - Frequent neuro checks - Echocardiogram - Prophylactic  therapy-Antiplatelet FOY:DXAJOI 75mg  daily  after 300mg  load, listed allergy to NSAIDs. - Risk factor modification - Telemetry monitoring - PT consult, OT consult, Speech consult - Stroke team to follow    Roland Rack, MD [facility] [phone]  If 7pm- 7am, please page neurology on call as listed in [place].

## 2021-11-09 NOTE — Evaluation (Signed)
Physical Therapy Evaluation Patient Details Name: Andrea Mullen MRN: [id number] DOB: 1965-04-27 Today's Date: 11/09/2021  History of Present Illness  Pt is a 56 year old woman admitted on 10/29/21 with 6 week period of vision changes in L eye and chronic, intermittent R UE numbness concerning for stroke. Head CT negative for acute changes, + multiple chronic lacunar strokes. Pt also with L carotid stenosis. PMH: HTN, HLD, anxiety, asthma, lung ca, CAD, anemia, hx of polysubstance abuse, current smoker.  Clinical Impression  Pt admitted secondary to problem above with deficits below. Pt requiring min guard A for mobility. Decreased safety awareness and notable visual deficits in L eye. Tended to run into objects on L and required safety cues throughout. Recommending HHPT to address deficits. Will continue to follow acutely.        Recommendations for follow up therapy are one component of a multi-disciplinary discharge planning process, led by the attending physician.  Recommendations may be updated based on patient status, additional functional criteria and insurance authorization.  Follow Up Recommendations Home health PT      Assistance Recommended at Discharge Intermittent Supervision/Assistance  Patient can return home with the following  Assistance with cooking/housework;Assist for transportation;Help with stairs or ramp for entrance    Equipment Recommendations Rolling walker (2 wheels);BSC/3in1 (tub bench)  Recommendations for Other Services       Functional Status Assessment Patient has had a recent decline in their functional status and demonstrates the ability to make significant improvements in function in a reasonable and predictable amount of time.     Precautions / Restrictions Precautions Precautions: Fall Precaution Comments: endorses falls when walking to the store Restrictions Weight Bearing Restrictions: No      Mobility  Bed Mobility Overal bed mobility:  Modified Independent             General bed mobility comments: HOB up    Transfers Overall transfer level: Needs assistance Equipment used: None, Rolling walker (2 wheels) Transfers: Sit to/from Stand Sit to Stand: Supervision           General transfer comment: uses momentum from low surface, no physical assist    Ambulation/Gait Ambulation/Gait assistance: Min guard Gait Distance (Feet): 50 Feet Assistive device: Rolling walker (2 wheels), None Gait Pattern/deviations: Step-through pattern, Decreased stride length, Shuffle Gait velocity: decreased     General Gait Details: Pt with shuffle type steps with use of RW and required cues for safety throughout. Without RW, pt with increased gait speed. Poor awareness of L periphery and ran into object on L.  Stairs            Wheelchair Mobility    Modified Rankin (Stroke Patients Only)       Balance Overall balance assessment: Needs assistance Sitting-balance support: No upper extremity supported Sitting balance-Leahy Scale: Good Sitting balance - Comments: no LOB donning socks   Standing balance support: No upper extremity supported, Bilateral upper extremity supported Standing balance-Leahy Scale: Fair                               Pertinent Vitals/Pain Pain Assessment Pain Assessment: Faces Faces Pain Scale: No hurt    Home Living Family/patient expects to be discharged to:: Private residence Living Arrangements: Children Available Help at Discharge: Family;Available PRN/intermittently Type of Home: Apartment Home Access: Stairs to enter Entrance Stairs-Rails: Right;Left;Can reach both Entrance Stairs-Number of Steps: 2 flights   Home Layout: One level  Home Equipment: Kasandra Knudsen - single point      Prior Function Prior Level of Function : Independent/Modified Independent             Mobility Comments: Uses cane for longer distances ADLs Comments: Pt is able to don socks, but  frequently relies on her granddaughter to help. Reports she is afraid she is going to fall in the tub and struggles to stand from standard toilet.     Hand Dominance   Dominant Hand: Right    Extremity/Trunk Assessment   Upper Extremity Assessment Upper Extremity Assessment: Defer to OT evaluation    Lower Extremity Assessment Lower Extremity Assessment: Generalized weakness    Cervical / Trunk Assessment Cervical / Trunk Assessment: Other exceptions;Normal (obesity)  Communication   Communication: No difficulties  Cognition Arousal/Alertness: Awake/alert Behavior During Therapy: Impulsive Overall Cognitive Status: Impaired/Different from baseline Area of Impairment: Safety/judgement                         Safety/Judgement: Decreased awareness of safety, Decreased awareness of deficits     General Comments: likely baseline cognition, pt refused to seek further medical workup when vision became impaired        General Comments      Exercises     Assessment/Plan    PT Assessment Patient needs continued PT services  PT Problem List Decreased strength;Decreased range of motion;Decreased balance;Decreased activity tolerance;Decreased mobility;Decreased knowledge of use of DME;Decreased safety awareness;Decreased knowledge of precautions       PT Treatment Interventions DME instruction;Gait training;Stair training;Functional mobility training;Therapeutic activities;Therapeutic exercise;Balance training;Patient/family education    PT Goals (Current goals can be found in the Care Plan section)  Acute Rehab PT Goals Patient Stated Goal: to go home PT Goal Formulation: With patient Time For Goal Achievement: 11/23/21 Potential to Achieve Goals: Good    Frequency Min 3X/week     Co-evaluation               AM-PAC PT "6 Clicks" Mobility  Outcome Measure Help needed turning from your back to your side while in a flat bed without using bedrails?:  None Help needed moving from lying on your back to sitting on the side of a flat bed without using bedrails?: None Help needed moving to and from a bed to a chair (including a wheelchair)?: A Little Help needed standing up from a chair using your arms (e.g., wheelchair or bedside chair)?: A Little Help needed to walk in hospital room?: A Little Help needed climbing 3-5 steps with a railing? : A Little 6 Click Score: 20    End of Session   Activity Tolerance: Patient tolerated treatment well Patient left: in bed;with call bell/phone within reach;with family/visitor present Nurse Communication: Mobility status PT Visit Diagnosis: Unsteadiness on feet (R26.81);Muscle weakness (generalized) (M62.81)    Time: 5462-7035 PT Time Calculation (min) (ACUTE ONLY): 20 min   Charges:   PT Evaluation $PT Eval Moderate Complexity: 1 Mod          Reuel Derby, PT, DPT  Acute Rehabilitation Services  Office: [phone]   Rudean Hitt 11/09/2021, 2:49 PM

## 2021-11-09 NOTE — Progress Notes (Signed)
*  PRELIMINARY RESULTS* Echocardiogram 2D Echocardiogram has been performed.  Andrea Mullen 11/09/2021, 3:02 PM

## 2021-11-09 NOTE — Progress Notes (Signed)
STROKE TEAM PROGRESS NOTE   SUBJECTIVE (INTERVAL HISTORY) Her daughter is at the bedside.  Overall her condition is stable.  Patient stated that she had left eye vision loss 2 months ago, same time she had right arm numbness with pain, right leg weakness with dragging.  She went to see her eye doctor and recommended come to hospital for evaluation.  However, patient did not seek further medical attention.  Recently, patient followed up with ophthalmology with persistent left eye vision loss, and was encouraged to come to ER immediately.   OBJECTIVE Temp:  [97.8 F (36.6 C)-98.6 F (37 C)] 97.8 F (36.6 C) (10/01 1306) Pulse Rate:  [84-92] 84 (10/01 1306) Cardiac Rhythm: Normal sinus rhythm (10/01 0700) Resp:  [15-20] 16 (10/01 0836) BP: (106-137)/(63-95) 119/84 (10/01 0836) SpO2:  [92 %-100 %] 92 % (10/01 1306) Weight:  [107.7 kg] 107.7 kg (09/30 2046)  No results for input(s): "GLUCAP" in the last 168 hours. Recent Labs  Lab 11/08/21 1238  NA 139  K 4.0  CL 103  CO2 29  GLUCOSE 99  BUN 12  CREATININE 0.78  CALCIUM 9.6   Recent Labs  Lab 11/08/21 1238  AST 13*  ALT 10  ALKPHOS 62  BILITOT 0.6  PROT 7.1  ALBUMIN 4.2   Recent Labs  Lab 11/08/21 1238  WBC 4.5  NEUTROABS 2.5  HGB 11.9*  HCT 36.8  MCV 89.5  PLT 183   No results for input(s): "CKTOTAL", "CKMB", "CKMBINDEX", "TROPONINI" in the last 168 hours. No results for input(s): "LABPROT", "INR" in the last 72 hours. Recent Labs    11/08/21 1238  COLORURINE YELLOW  LABSPEC 1.020  PHURINE 5.5  GLUCOSEU NEGATIVE  HGBUR NEGATIVE  BILIRUBINUR NEGATIVE  KETONESUR NEGATIVE  PROTEINUR NEGATIVE  NITRITE NEGATIVE  LEUKOCYTESUR MODERATE*       Component Value Date/Time   CHOL 237 (H) 11/08/2021 2307   TRIG 78 11/08/2021 2307   HDL 39 (L) 11/08/2021 2307   CHOLHDL 6.1 11/08/2021 2307   VLDL 16 11/08/2021 2307   LDLCALC 182 (H) 11/08/2021 2307   LDLCALC 174 (H) 04/03/2021 0000   Lab Results  Component  Value Date   HGBA1C 5.3 11/08/2021      Component Value Date/Time   LABOPIA NONE DETECTED 11/08/2021 1238   COCAINSCRNUR NONE DETECTED 11/08/2021 1238   COCAINSCRNUR POSITIVE (A) 06/01/2011 1231   LABBENZ NONE DETECTED 11/08/2021 1238   LABBENZ NEGATIVE 06/01/2011 1231   AMPHETMU NONE DETECTED 11/08/2021 1238   THCU NONE DETECTED 11/08/2021 1238   LABBARB NONE DETECTED 11/08/2021 1238    Recent Labs  Lab 11/08/21 1238  ETH <10    I have personally reviewed the radiological images below and agree with the radiology interpretations.  MR ORBITS W WO CONTRAST  Result Date: 11/09/2021 CLINICAL DATA:  Provided history: Vision loss, monocular. EXAM: MRI HEAD AND ORBITS WITHOUT AND WITH CONTRAST TECHNIQUE: Multiplanar, multiecho pulse sequences of the brain and surrounding structures were obtained without and with intravenous contrast. Multiplanar, multiecho pulse sequences of the orbits and surrounding structures were obtained including fat saturation techniques, before and after intravenous contrast administration. CONTRAST:  10 mL Vueway intravenous contrast. COMPARISON:  Noncontrast head CT and CT angiogram head/neck 11/08/2021. FINDINGS: Intermittently motion degraded examination, somewhat limiting evaluation. Most notably, there is moderate motion degradation of the sagittal T1 weighted sequence, moderate to severe motion degradation of the coronal T1 weighted postcontrast sequence and severe motion degradation of the sagittal T1 weighted postcontrast sequence. Within this  limitation, findings are as follows. MRI HEAD FINDINGS Brain: Generalized cerebral and cerebellar atrophy. Chronic hemorrhagic infarct within the right corona radiata/basal ganglia. Chronic small-vessel infarct within the left corona radiata/basal ganglia. Chronic lacunar infarcts within the right thalamus and within the central pons. Background mild-to-moderate multifocal T2 FLAIR hyperintense signal abnormality within the  cerebral white matter, nonspecific but compatible with chronic small vessel disease. Several scattered chronic microhemorrhages within the supratentorial brain. Numerous tiny chronic infarcts within both cerebellar hemispheres. There is no acute infarct. No evidence of an intracranial mass. No extra-axial fluid collection. No midline shift. No pathologic intracranial enhancement identified. Vascular: Maintained flow voids within the proximal large arterial vessels. Skull and upper cervical spine: No focal suspicious marrow lesion. MRI ORBITS FINDINGS Mild intermittent motion degradation. Orbits: The globes are normal in size and contour. Portions of the intraorbital left optic nerve appear atrophic posteriorly (for instance as seen on series 11, images 12-14). The extraocular muscles and lateral glands are symmetric and unremarkable. No intraorbital mass or pathologic intraorbital enhancement. Visualized sinuses: Mild paranasal sinus mucosal thickening, greatest within the bilateral ethmoid and left maxillary sinuses. Soft tissues: Unremarkable appearance of the visualized maxillofacial and upper neck soft tissues. IMPRESSION: MRI brain: 1. Motion degraded examination. 2. No evidence of acute intracranial abnormality. 3. Parenchymal atrophy, chronic small vessel disease and chronic infarcts as detailed. 4. Several chronic parenchymal microhemorrhages within the supratentorial brain. 5. Numerous tiny chronic infarcts within bilateral cerebellar hemispheres. MRI Orbits: 1. Mildly motion degraded exam. 2. Portions of the intraorbital left optic nerve appear atrophic. 3. No intraorbital mass or pathologic or intraorbital enhancement. 4. Mild paranasal sinus mucosal thickening. Electronically Signed   By: Kellie Simmering D.O.   On: 11/09/2021 12:38   MR BRAIN W WO CONTRAST  Result Date: 11/09/2021 CLINICAL DATA:  Provided history: Vision loss, monocular. EXAM: MRI HEAD AND ORBITS WITHOUT AND WITH CONTRAST TECHNIQUE:  Multiplanar, multiecho pulse sequences of the brain and surrounding structures were obtained without and with intravenous contrast. Multiplanar, multiecho pulse sequences of the orbits and surrounding structures were obtained including fat saturation techniques, before and after intravenous contrast administration. CONTRAST:  10 mL Vueway intravenous contrast. COMPARISON:  Noncontrast head CT and CT angiogram head/neck 11/08/2021. FINDINGS: Intermittently motion degraded examination, somewhat limiting evaluation. Most notably, there is moderate motion degradation of the sagittal T1 weighted sequence, moderate to severe motion degradation of the coronal T1 weighted postcontrast sequence and severe motion degradation of the sagittal T1 weighted postcontrast sequence. Within this limitation, findings are as follows. MRI HEAD FINDINGS Brain: Generalized cerebral and cerebellar atrophy. Chronic hemorrhagic infarct within the right corona radiata/basal ganglia. Chronic small-vessel infarct within the left corona radiata/basal ganglia. Chronic lacunar infarcts within the right thalamus and within the central pons. Background mild-to-moderate multifocal T2 FLAIR hyperintense signal abnormality within the cerebral white matter, nonspecific but compatible with chronic small vessel disease. Several scattered chronic microhemorrhages within the supratentorial brain. Numerous tiny chronic infarcts within both cerebellar hemispheres. There is no acute infarct. No evidence of an intracranial mass. No extra-axial fluid collection. No midline shift. No pathologic intracranial enhancement identified. Vascular: Maintained flow voids within the proximal large arterial vessels. Skull and upper cervical spine: No focal suspicious marrow lesion. MRI ORBITS FINDINGS Mild intermittent motion degradation. Orbits: The globes are normal in size and contour. Portions of the intraorbital left optic nerve appear atrophic posteriorly (for instance  as seen on series 11, images 12-14). The extraocular muscles and lateral glands are symmetric and unremarkable. No intraorbital  mass or pathologic intraorbital enhancement. Visualized sinuses: Mild paranasal sinus mucosal thickening, greatest within the bilateral ethmoid and left maxillary sinuses. Soft tissues: Unremarkable appearance of the visualized maxillofacial and upper neck soft tissues. IMPRESSION: MRI brain: 1. Motion degraded examination. 2. No evidence of acute intracranial abnormality. 3. Parenchymal atrophy, chronic small vessel disease and chronic infarcts as detailed. 4. Several chronic parenchymal microhemorrhages within the supratentorial brain. 5. Numerous tiny chronic infarcts within bilateral cerebellar hemispheres. MRI Orbits: 1. Mildly motion degraded exam. 2. Portions of the intraorbital left optic nerve appear atrophic. 3. No intraorbital mass or pathologic or intraorbital enhancement. 4. Mild paranasal sinus mucosal thickening. Electronically Signed   By: Kellie Simmering D.O.   On: 11/09/2021 12:38   CT ANGIO HEAD NECK W WO CM  Result Date: 11/08/2021 CLINICAL DATA:  Neuro deficit, acute, stroke suspected. Additional history provided: Sudden loss of vision in left eye. EXAM: CT ANGIOGRAPHY HEAD AND NECK TECHNIQUE: Multidetector CT imaging of the head and neck was performed using the standard protocol during bolus administration of intravenous contrast. Multiplanar CT image reconstructions and MIPs were obtained to evaluate the vascular anatomy. Carotid stenosis measurements (when applicable) are obtained utilizing NASCET criteria, using the distal internal carotid diameter as the denominator. RADIATION DOSE REDUCTION: This exam was performed according to the departmental dose-optimization program which includes automated exposure control, adjustment of the mA and/or kV according to patient size and/or use of iterative reconstruction technique. CONTRAST:  133mL OMNIPAQUE IOHEXOL 350 MG/ML  SOLN COMPARISON:  Head CT 09/26/2011. FINDINGS: CT HEAD FINDINGS Brain: Generalized cerebral and cerebellar atrophy. Chronic lacunar infarct versus prominent perivascular space within the left basal ganglia. Chronic lacunar infarct within the right basal ganglia. Chronic lacunar infarct within the right thalamus. Chronic infarct within the central pons. Background mild patchy and ill-defined hypoattenuation within the cerebral white matter, nonspecific but compatible with chronic small vessel image disease. There is no acute intracranial hemorrhage. No demarcated cortical infarct. No extra-axial fluid collection. No evidence of an intracranial mass. No midline shift. Vascular: No hyperdense vessel.  Atherosclerotic calcifications. Skull: No fracture or aggressive osseous lesion. Sinuses/Orbits: No mass or acute finding within the imaged orbits. Minimal mucosal thickening within the left maxillary sinus. Review of the MIP images confirms the above findings CTA NECK FINDINGS Motion degraded examination. Aortic arch: Common origin of the innominate and left common carotid arteries. Atherosclerotic plaque within the visualized aortic arch and proximal major branch vessels of the neck. Streak and beam hardening artifact arising from a dense left-sided contrast bolus partially obscures the left subclavian artery. Within this limitation, there is no appreciable hemodynamically significant innominate or proximal subclavian artery stenosis. Right carotid system: CCA and ICA patent within the neck without hemodynamically significant stenosis (50% or greater). Partially retropharyngeal course of the cervical ICA. Left carotid system: CCA and ICA patent within the neck without hemodynamically significant stenosis (50% or greater). Mild atherosclerotic plaque about the carotid bifurcation and within the proximal ICA. Partially retropharyngeal course of the cervical ICA. Vertebral arteries: Venous reflux from a left-sided  contrast bolus partially obscures the V1 and proximal V2 left vertebral artery. Within this limitation, the vertebral arteries are patent within the neck without appreciable hemodynamically significant stenosis. Skeleton: No acute fracture or aggressive osseous lesion. Other neck: No neck mass or cervical lymphadenopathy. Upper chest: Suspected incompletely imaged pulmonary nodule within the right upper lobe, measuring at least 8 mm (series 11, image 303). Review of the MIP images confirms the above findings CTA HEAD FINDINGS  Anterior circulation: ICA atherosclerotic plaque within both vessels. Most notably, there is up to moderate stenosis within the right cavernous segment and apparent moderate to severe stenosis within the left cavernous segment. The M1 middle cerebral arteries are patent. No M2 proximal branch occlusion or high-grade proximal stenosis. The anterior cerebral arteries are patent. Atherosclerotic irregularity of both anterior cerebral arteries without high-grade proximal stenosis. No intracranial aneurysm is identified. Posterior circulation: The intracranial vertebral arteries are patent. The basilar artery is patent. The posterior cerebral arteries are patent. Atherosclerotic irregularity of both vessels. Most notably, there is a moderate stenosis within the left P2 segment, severe stenosis within a left PCA branch at the P2/P3 junction, and severe stenosis within the right PCA branch at the P2/P3 junction. Venous sinuses: Within the limitations of contrast timing, no convincing thrombus. Anatomic variants: As described. Review of the MIP images confirms the above findings IMPRESSION: CT head: 1. No evidence of acute intracranial abnormality. 2. Parenchymal atrophy and progressive chronic small-vessel ischemic disease with chronic lacunar infarcts as described. CTA neck: 1. The common carotid and internal carotid arteries are patent within the neck without hemodynamically significant stenosis.  Mild atherosclerotic plaque about the left carotid bifurcation and within the proximal left ICA. 2. Venous reflux from a left-sided contrast bolus partially obscures the V1 and proximal V2 left vertebral artery. Within this limitation, the cervical vertebral arteries are patent without hemodynamically significant stenosis. 3. Apparent partially imaged right upper lobe pulmonary nodule, measuring at least 8 mm, and concerning for possible pulmonary neoplasm. A chest CT is recommended for further evaluation. CTA head: 1. No intracranial large vessel occlusion is identified. 2. Intracranial atherosclerotic disease with multifocal stenoses, most notably as follows. 3. Moderate stenosis within the cavernous right ICA. 4. Moderate to severe stenosis within the cavernous left ICA. 5. Moderate stenosis within the left PCA P2 segment. 6. Severe stenoses within PCA branches at the P2/P3 junction, bilaterally. Electronically Signed   By: Kellie Simmering D.O.   On: 11/08/2021 15:18     PHYSICAL EXAM  Temp:  [97.8 F (36.6 C)-98.6 F (37 C)] 97.8 F (36.6 C) (10/01 1306) Pulse Rate:  [84-92] 84 (10/01 1306) Resp:  [15-20] 16 (10/01 0836) BP: (106-137)/(63-95) 119/84 (10/01 0836) SpO2:  [92 %-100 %] 92 % (10/01 1306) Weight:  [107.7 kg] 107.7 kg (09/30 2046)  General - Well nourished, well developed, in no apparent distress.  Ophthalmologic - fundi not visualized due to noncooperation.  Cardiovascular - Regular rhythm and rate.  Mental Status -  Level of arousal and orientation to time, place, and person were intact. Language including expression, naming, repetition, comprehension was assessed and found intact. Fund of Knowledge was assessed and was intact.  Cranial Nerves II - XII - II - Visual field intact OD.  Total vision loss OS. III, IV, VI - Extraocular movements intact. V - Facial sensation intact bilaterally. VII - Facial movement intact bilaterally. VIII - Hearing & vestibular intact  bilaterally. X - Palate elevates symmetrically. XI - Chin turning & shoulder shrug intact bilaterally. XII - Tongue protrusion intact.  Motor Strength - The patient's strength was normal in LUE and BLEs. RUE 4/5 with pain at shoulder with movement. Bulk was normal and fasciculations were absent.   Motor Tone - Muscle tone was assessed at the neck and appendages and was normal.  Reflexes - The patient's reflexes were symmetrical in all extremities and she had no pathological reflexes.  Sensory - Light touch, temperature/pinprick were assessed and were  symmetrical.    Coordination - The patient had normal movements in the hands with no ataxia or dysmetria.  Tremor was absent.  Gait and Station - deferred.   ASSESSMENT/PLAN Ms. Andrea Mullen is a 56 y.o. female with history of hypertension, hyperlipidemia, smoker, lung cancer status postchemotherapy and radiation admitted for left eye vision loss 2 months ago with right arm numbness/pain and right leg weakness. No tPA given due to outside window.    Stroke: likely chronic left BG/CR infarct with left CRAO, likely secondary to large vessel disease source of left ICA siphon atherosclerosis/stenosis Left total vision loss with left sided numbness/weakness 2 months ago CT no acute abnormality CTA head and neck bilateral ICA siphon, left P2 and bilateral P2/P3 stenosis. 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 2D Echo EF 60 to 65% LDL 182 HgbA1c 5.3 UDS negative Lovenox for VTE prophylaxis No antithrombotic prior to admission, now on aspirin 325 mg daily and clopidogrel 75 mg daily DAPT for 3 months and then aspirin alone given large vessel disease. Patient counseled to be compliant with her antithrombotic medications Ongoing aggressive stroke risk factor management Therapy recommendations: Home health PT/OT Disposition: Pending  Left CRAO Total vision loss for last 2 months Continue follow-up  with ophthalmology  Lung nodules History of lung cancer status post chemotherapy and radiation CTA neck showed right upper lobe lung nodule CTA chest pending  Hypertension Stable Long term BP goal normotensive  Hyperlipidemia Home meds: None LDL 92, goal < 70 Now on Crestor 40 Continue statin at discharge  Tobacco abuse Current smoker, patient stated that he smokes only after meal Smoking cessation counseling provided Pt is willing to quit  Other Stroke Risk Factors Obesity, Body mass index is 37.2 kg/m.   Other Active Problems UA WBC 11-20  Hospital day # 0  Neurology will sign off. Please call with questions. Pt will follow up with stroke clinic NP at [facility] in about 4 weeks. Thanks for the consult.   Rosalin Hawking, MD PhD Stroke Neurology 11/09/2021 2:11 PM    To contact Stroke Continuity provider, please refer to [email or web address] After hours, contact General Neurology

## 2021-11-09 NOTE — Evaluation (Addendum)
Occupational Therapy Evaluation Patient Details Name: Andrea Mullen MRN: [id number] DOB: [birth year] Today's Date: 11/09/2021   History of Present Illness Pt is a 56 year old woman admitted on 10/29/21 with 6 week period of vision changes in L eye and chronic, intermittent R UE numbness concerning for stroke. Head CT negative for acute changes, + multiple chronic lacunar strokes. Pt also with L carotid stenosis. PMH: HTN, HLD, anxiety, asthma, lung ca, CAD, anemia, hx of polysubstance abuse, current smoker.   Clinical Impression   Pt lives with her family and typically walks with a cane when outside of her home. She reports difficulty standing from low surfaces such as her standard toilet and must use her UEs and momentum. Pt is fearful she will fall in the shower. Pt presents with impulsivity, decreased safety awareness, impaired vision in L eye and impaired standing balance. She requires supervision for safety with and without RW and set up to supervision for ADLs. Recommending DME as detailed below and HHOT. Pt educated in risk factors and s/s of stroke.     Recommendations for follow up therapy are one component of a multi-disciplinary discharge planning process, led by the attending physician.  Recommendations may be updated based on patient status, additional functional criteria and insurance authorization.   Follow Up Recommendations  Home Health OT    Assistance Recommended at Discharge Intermittent Supervision/Assistance  Patient can return home with the following Assist for transportation;Help with stairs or ramp for entrance    Functional Status Assessment  Patient has had a recent decline in their functional status and demonstrates the ability to make significant improvements in function in a reasonable and predictable amount of time.  Equipment Recommendations  BSC/3in1;Tub/shower bench    Recommendations for Other Services       Precautions / Restrictions  Precautions Precautions: Fall Precaution Comments: endorses falls when walking to the store      Mobility Bed Mobility Overal bed mobility: Modified Independent             General bed mobility comments: HOB up    Transfers Overall transfer level: Needs assistance Equipment used: None, Rolling walker (2 wheels) Transfers: Sit to/from Stand Sit to Stand: Supervision           General transfer comment: uses momentum from low surface, no physical assist      Balance Overall balance assessment: Needs assistance   Sitting balance-Leahy Scale: Good Sitting balance - Comments: no LOB donning socks     Standing balance-Leahy Scale: Fair                             ADL either performed or assessed with clinical judgement   ADL Overall ADL's : Needs assistance/impaired Eating/Feeding: Independent;Sitting   Grooming: Supervision/safety;Standing   Upper Body Bathing: Set up;Sitting   Lower Body Bathing: Supervison/ safety;Sit to/from stand   Upper Body Dressing : Set up;Sitting   Lower Body Dressing: Supervision/safety;Sit to/from stand Lower Body Dressing Details (indicate cue type and reason): can perform figure 4 to reach feet in sitting Toilet Transfer: Supervision/safety;Ambulation   Toileting- Clothing Manipulation and Hygiene: Supervision/safety;Sit to/from stand       Functional mobility during ADLs: Supervision/safety;Rolling walker (2 wheels)       Vision Ability to See in Adequate Light: 1 Impaired Patient Visual Report: Blurring of vision (L eye) Additional Comments: educated in compensatory strategy of turning her head to look at items in L visual  field, pt nearly fell without RW due to not attending to obstacles on L     Perception     Praxis      Pertinent Vitals/Pain Pain Assessment Pain Assessment: Faces Faces Pain Scale: No hurt     Hand Dominance Right   Extremity/Trunk Assessment Upper Extremity Assessment Upper  Extremity Assessment: Overall WFL for tasks assessed   Lower Extremity Assessment Lower Extremity Assessment: Defer to PT evaluation   Cervical / Trunk Assessment Cervical / Trunk Assessment: Other exceptions;Normal (obesity)   Communication Communication Communication: No difficulties   Cognition Arousal/Alertness: Awake/alert Behavior During Therapy: Impulsive Overall Cognitive Status: Impaired/Different from baseline Area of Impairment: Safety/judgement                         Safety/Judgement: Decreased awareness of safety, Decreased awareness of deficits     General Comments: likely baseline cognition, pt refused to seek further medical workup when vision became impaired     General Comments       Exercises     Shoulder Instructions      Home Living Family/patient expects to be discharged to:: Private residence Living Arrangements: Children Available Help at Discharge: Family;Available PRN/intermittently Type of Home: Apartment Home Access: Stairs to enter [employer] of Steps: 2 flights Entrance Stairs-Rails: Right;Left;Can reach both Home Layout: One level     Bathroom Shower/Tub: Teacher, early years/pre: Standard     Home Equipment: Cane - single point          Prior Functioning/Environment Prior Level of Function : Independent/Modified Independent             Mobility Comments: Uses cane for longer distances ADLs Comments: Pt is able to don socks, but frequently relies on her granddaughter to help. Reports she is afraid she is going to fall in the tub and struggles to stand from standard toilet.        OT Problem List: Impaired balance (sitting and/or standing);Decreased activity tolerance;Decreased cognition;Decreased safety awareness;Decreased knowledge of use of DME or AE;Obesity      OT Treatment/Interventions: Self-care/ADL training;DME and/or AE instruction;Therapeutic activities;Patient/family  education;Balance training    OT Goals(Current goals can be found in the care plan section) Acute Rehab OT Goals OT Goal Formulation: With patient Time For Goal Achievement: 11/23/21 Potential to Achieve Goals: Good ADL Goals Pt Will Perform Grooming: with modified independence;standing Pt Will Transfer to Toilet: with modified independence;ambulating;bedside commode Pt Will Perform Toileting - Clothing Manipulation and hygiene: with modified independence;sit to/from stand Additional ADL Goal #1: Pt will state at least 3 fall prevention strategies as instructed. Additional ADL Goal #2: Pt will implement compensatory strategies for blurred vision in L eye to prevent falls and injury.  OT Frequency: Min 2X/week    Co-evaluation              AM-PAC OT "6 Clicks" Daily Activity     Outcome Measure Help from another person eating meals?: None Help from another person taking care of personal grooming?: A Little Help from another person toileting, which includes using toliet, bedpan, or urinal?: A Little Help from another person bathing (including washing, rinsing, drying)?: A Little Help from another person to put on and taking off regular upper body clothing?: None Help from another person to put on and taking off regular lower body clothing?: A Little 6 Click Score: 20   End of Session Equipment Utilized During Treatment: Rolling walker (2 wheels) Nurse Communication:  Other (comment) (pt wants to eat)  Activity Tolerance: Patient tolerated treatment well Patient left: in bed;with call bell/phone within reach;with family/visitor present  OT Visit Diagnosis: Unsteadiness on feet (R26.81);Other abnormalities of gait and mobility (R26.89);Other symptoms and signs involving cognitive function                Time: 9024-0973 OT Time Calculation (min): 19 min Charges:  OT General Charges $OT Visit: 1 Visit OT Evaluation $OT Eval Low Complexity: [place], OTR/L Acute  Rehabilitation Services Office: [phone]  Malka So 11/09/2021, 12:12 PM

## 2021-11-10 ENCOUNTER — Other Ambulatory Visit (HOSPITAL_COMMUNITY): Payer: Self-pay

## 2021-11-10 ENCOUNTER — Encounter (HOSPITAL_COMMUNITY): Payer: Self-pay | Admitting: Emergency Medicine

## 2021-11-10 ENCOUNTER — Emergency Department (HOSPITAL_COMMUNITY)
Admission: EM | Admit: 2021-11-10 | Discharge: 2021-11-11 | Discharge status: Against Medical Advice | Payer: Medicaid Other | Attending: Emergency Medicine | Admitting: Emergency Medicine

## 2021-11-10 ENCOUNTER — Emergency Department (HOSPITAL_COMMUNITY): Payer: Medicaid Other

## 2021-11-10 ENCOUNTER — Other Ambulatory Visit: Payer: Self-pay

## 2021-11-10 DIAGNOSIS — Z5321 Procedure and treatment not carried out due to patient leaving prior to being seen by health care provider: Secondary | ICD-10-CM | POA: Diagnosis not present

## 2021-11-10 DIAGNOSIS — R040 Epistaxis: Secondary | ICD-10-CM | POA: Diagnosis present

## 2021-11-10 DIAGNOSIS — Z7901 Long term (current) use of anticoagulants: Secondary | ICD-10-CM | POA: Diagnosis not present

## 2021-11-10 DIAGNOSIS — I639 Cerebral infarction, unspecified: Secondary | ICD-10-CM | POA: Diagnosis not present

## 2021-11-10 DIAGNOSIS — I350 Nonrheumatic aortic (valve) stenosis: Secondary | ICD-10-CM

## 2021-11-10 LAB — I-STAT BETA HCG BLOOD, ED (MC, WL, AP ONLY): I-stat hCG, quantitative: <5 m[IU]/mL (ref <5)

## 2021-11-10 MED ORDER — CLOPIDOGREL BISULFATE 75 MG PO TABS
75.0000 mg | ORAL_TABLET | Freq: Every day | ORAL | 2 refills | Status: DC
  Filled 2021-11-10: qty 30, 30d supply, fill #0
  Filled 2021-12-11: qty 30, 30d supply, fill #1

## 2021-11-10 MED ORDER — ASPIRIN 325 MG PO TBEC
325.0000 mg | DELAYED_RELEASE_TABLET | Freq: Every day | ORAL | 3 refills | Status: DC
  Filled 2021-11-10: qty 30, 30d supply, fill #0
  Filled 2021-12-11 (×2): qty 30, 30d supply, fill #1

## 2021-11-10 MED ORDER — ROSUVASTATIN CALCIUM 40 MG PO TABS
40.0000 mg | ORAL_TABLET | Freq: Every day | ORAL | 3 refills | Status: DC
  Filled 2021-11-10: qty 30, 30d supply, fill #0
  Filled 2021-12-11 (×2): qty 30, 30d supply, fill #1

## 2021-11-10 NOTE — Hospital Course (Signed)
Andrea Mullen is 56 y.o. F HTN, CAD, anemia, polysubstance abuse, asthma, anxiety and hx lung CA s/p chemo rad who presented with left eye vision loss for 1 month.  Neurology were consulted.  MRI showed basal ganglia/corona radiata stroke as culprit.

## 2021-11-10 NOTE — Assessment & Plan Note (Signed)
-   Follow up with PCP if not up to date with colonoscopy, should be arranged

## 2021-11-10 NOTE — ED Provider Triage Note (Signed)
Emergency Medicine Provider Triage Evaluation Note  Andrea Mullen , a 56 y.o. female  was evaluated in triage.  Pt complains of bleeding.  Seen here recently for CVA, started on ASA and plavix.  Today having bleeding issues-- epistaxis and afterwards started coughing up blood.  Denies bloody stools or emesis.  Has been compliant with meds.  No hx of same.  Review of Systems  Positive: bleeding Negative: fever  Physical Exam  BP 119/79 (BP Location: Right Arm)   Pulse 90   Temp 98.2 F (36.8 C) (Oral)   Resp 15   LMP 11/16/2016   SpO2 100%   Gen:   Awake, no distress   Resp:  Normal effort  MSK:   Moves extremities without difficulty  Other:  Dried blood in left nostril, emesis bag at bedside, intermittently spitting out bloody mucous  Medical Decision Making  Medically screening exam initiated at 11:30 PM.  Appropriate orders placed.  Andrea Mullen was informed that the remainder of the evaluation will be completed by another provider, this initial triage assessment does not replace that evaluation, and the importance of remaining in the ED until their evaluation is complete.  Bleeding.  Recent CVA, started ASA and plavix.  Now having epistaxis and spitting up blood.  Denies bloody stools/emesis.  HD stable in triage without active bleeding.  Will check EKG, labs, type and screen.   Larene Pickett, PA-C 11/10/21 2333

## 2021-11-10 NOTE — ED Triage Notes (Signed)
Pt reports she was started on plavix and Asprin x 3 days, pt reports she was d/c from the hosp today and began having a nose bleed and coughing up blood

## 2021-11-10 NOTE — Assessment & Plan Note (Signed)
likely chronic left BG/CR infarct with left CRAO, likely secondary to large vessel disease source of left ICA siphon atherosclerosis/stenosis -Non-invasive angiography showed  bilateral ICA siphon, left P2 and bilateral P2/P3 stenosis. -Echocardiogram showed no cardiogenic source of embolism -Carotid imaging unremarkable   -Lipids ordered: LDL 182 discharged on ** -Aspirin ordered at admission --> discharged on **now on aspirin 325 mg daily and clopidogrel 75 mg daily DAPT for 3 months and then aspirin alone given large vessel disease. -Atrial fibrillation: ** -tPA not given because ** -Dysphagia screen ordered in ER -PT eval ordered: recommended ** -Smoking cessation: **

## 2021-11-10 NOTE — Discharge Summary (Signed)
Physician Discharge Summary   Patient: Andrea Mullen MRN: [id number] DOB: [birth year]  Admit date:     11/08/2021  Discharge date: 11/10/21  Discharge Physician: Edwin Dada   PCP: Nolene Ebbs, MD     Recommendations at discharge:  Follow up with [facility] primary care in 1 week Follow up with [facility] in 6-8 weeks for stroke Follow up with Cardiology (referral sent) for aortic stenosis [facility]: Please obtain a CT chest due to incidental lung nodule in this patient with history lung cancer [facility]: Follow up anemia and refer for colonoscopy if not up to date     Discharge Diagnoses: Principal Problem:   Stroke [facility]) Active Problems:   Morbid obesity ([place])   Pulmonary nodule   Smoking   Anxiety state   CAD (coronary artery disease)   Microcytic anemia   Asthma, chronic   HTN (hypertension)   HLD (hyperlipidemia)   Aortic stenosis     Hospital Course: Andrea Mullen is 56 y.o. F HTN, CAD, anemia, polysubstance abuse, asthma, anxiety and hx lung CA s/p chemo rad who presented with left eye vision loss for 1 month.  Neurology were consulted.  MRI showed basal ganglia/corona radiata stroke as culprit.   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  Noninvasive angiography showed bilateral ICA siphon, left P2 and bilateral P2/P3 stenoses Echocardiogram showed no cardiogenic source of embolism Lipids showed LDL 182, she was discharged on Crestor 40 mg nightly  Aspirin was ordered at admission and she was discharged on aspirin 325 daily and clopidogrel 75 mg for 3 months, followed by aspirin alone indefinitely  Atrial fibrillation not noted on monitoring  tPA not given because outside the window  Dysphagia screen ordered in the ER, PT  recommended home health, smoking cessation recommended       Microcytic anemia - Follow up with PCP if not up to date with colonoscopy, should be arranged  Aortic stenosis Incidental finding noted on echocardiogram which was obtained during stroke work-up.  She has severely restricted aortic valve area, cardiology follow-up was arranged.  Pulmonary nodules These were noted incidentally on CT of the neck obtained for stroke work-up.  Given her history of smoking, close follow-up was recommended.  Recommend this be coordinated by her PCP.  Explained to patient and daughter.          The [facility] Controlled Substances Registry was reviewed for this patient prior to discharge.  Consultants: Neurology Procedures performed: CTA head and neck, MRI brain, echocardiogram  Disposition: Home health Diet recommendation:  Discharge Diet Orders (From admission, onward)     Start     Ordered   11/10/21 0000  Diet - low sodium heart healthy        11/10/21 1057             DISCHARGE MEDICATION: Allergies as of 11/10/2021       Reactions   Prednisone Anaphylaxis   seizure   Bicillin C-r Hives   Ibuprofen Nausea Only   Nsaids Other (See Comments)   Hives   Penicillins Hives   Has patient had a PCN reaction causing immediate rash, facial/tongue/throat swelling, SOB or lightheadedness with hypotension: No Has patient had a PCN reaction causing severe rash involving mucus membranes or skin necrosis: Yes Has patient had a  PCN reaction that required hospitalization No Has patient had a PCN reaction occurring within the last 10 years: Yes  If all of the above answers are "NO", then may proceed with Cephalosporin use.        Medication List     STOP taking these medications    MIGRAINE FORMULA PO   naproxen sodium 220 MG tablet Commonly known as: ALEVE       TAKE these medications    acetaminophen 500 MG tablet Commonly known as: TYLENOL Take 1 tablet (500  mg total) by mouth every 6 (six) hours as needed. What changed: reasons to take this   albuterol 108 (90 Base) MCG/ACT inhaler Commonly known as: VENTOLIN HFA Inhale 2 puffs into the lungs every 6 (six) hours as needed for wheezing or shortness of breath.   aspirin EC 325 MG tablet Take 1 tablet (325 mg total) by mouth daily. Start taking on: November 11, 2021   cholecalciferol 25 MCG (1000 UNIT) tablet Commonly known as: VITAMIN D3 Take 1,000 Units by mouth daily.   clopidogrel 75 MG tablet Commonly known as: PLAVIX Take 1 tablet (75 mg total) by mouth daily. Start taking on: November 11, 2021   rosuvastatin 40 MG tablet Commonly known as: CRESTOR Take 1 tablet (40 mg total) by mouth daily. Start taking on: November 11, 2021        Follow-up Information     [facility] Follow up.   Specialty: Neurology Why: Stroke clinic.  Call if you haven't heard from them 55 in1 week Contact information: [facility] Follow up.          Cardiologist Follow up.   Why: They should call you.  Ask your primary care for a referral if they haven't called you by the time of your primary care doctor follow up appointment                Discharge Instructions     Ambulatory referral to Neurology   Complete by: As directed    Follow up with stroke clinic NP (Jessica Vanschaick or Cecille Rubin, if both not available, consider Zachery Dauer, or Ahern) at [place] in about 4 weeks. Thanks.   Diet - low sodium heart healthy   Complete by: As directed    Discharge instructions   Complete by: As directed    IMPORTANT: GO see your primary doctor in 1 week  I have sent a referral to the Neurology specialists (stroke doctors) to see you in the office in 6-8 weeks at [facility]  Take the new cholesterol medicien Crestor/rosuvastatin 40 mg nightly (take it at  night)  Take the aspirin 325 mg once daily and clopidogrel/Plavix 75 mg daily Take the Plavix for 3 months then stop  Take the aspirin from now on, life long  Do not take other NSAIDs like Aleve with aspirin and Plavix  Finally, control your blood pressure  IMPORTANT: Ask your doctor at [facility] to repeat a CT of your chest to look at the  lung nodule  IMPORTANT: Follow up with a Cardiologist I have sent a referral to a cardiologist and they should call you, but if you haven't heard from them by the time you see your primary care doctor, get a referral   Increase activity slowly   Complete by: As directed        Discharge Exam: Filed Weights  11/08/21 2046  Weight: 107.7 kg    General: Pt is alert, awake, not in acute distress Cardiovascular: RRR, nl S1-S2, no murmurs appreciated.   No LE edema.   Respiratory: Normal respiratory rate and rhythm.  CTAB without rales or wheezes. Abdominal: Abdomen soft and non-tender.  No distension or HSM.   Neuro/Psych: Speech fluent, mild right sided weakness, vision deficit of left eye.  Judgment and insight appear normal.   Condition at discharge: good  The results of significant diagnostics from this hospitalization (including imaging, microbiology, ancillary and laboratory) are listed below for reference.   Imaging Studies: ECHOCARDIOGRAM COMPLETE  Result Date: 11/09/2021    ECHOCARDIOGRAM REPORT   Patient Name:   MATRICIA BEGNAUD Ruggles Date of Exam: 11/09/2021 Medical Rec #:  [id number]        Height:       67.0 in Accession #:    [id number]       Weight:       237.5 lb Date of Birth:  1965-06-25        BSA:          2.175 m Patient Age:    56 years         BP:           119/84 mmHg Patient Gender: F                HR:           87 bpm. Exam Location:  Inpatient Procedure: 2D Echo, Cardiac Doppler and Color Doppler Indications:    I63.9 Stroke  History:        Patient has prior history of Echocardiogram examinations, most                 recent  10/31/2014. CAD and Previous Myocardial Infarction; Risk                 Factors:Hypertension and Dyslipidemia. Polysubstance abuse                 ([place]), Polysubstance abuse & Smoker from Hx. Obesity.  Sonographer:    Alvino Chapel RCS Referring Phys: [id number] Macy  1. Left ventricular ejection fraction, by estimation, is 60 to 65%. The left ventricle has normal function. The left ventricle has no regional wall motion abnormalities. There is moderate left ventricular hypertrophy. Left ventricular diastolic parameters are consistent with Grade I diastolic dysfunction (impaired relaxation).  2. Right ventricular systolic function is low normal. The right ventricular size is normal.  3. Left atrial size was mildly dilated.  4. The mitral valve is abnormal. Trivial mitral valve regurgitation. No evidence of mitral stenosis. Moderate mitral annular calcification.  5. The aortic valve has an indeterminant number of cusps and is heavily calcified, possibly bicuspid. Aortic valve regurgitation is trivial. Severe aortic valve stenosis. Aortic valve area, by VTI measures 0.52 cm. Aortic valve mean gradient measures 59.0 mmHg. Aortic valve Vmax measures 4.80 m/s. Peak gradient 92 mmHg. DI is 0.15. Comparison(s): Changes from prior study are noted. 10/31/2014: LVEF 55-60%, mild AI. FINDINGS  Left Ventricle: Left ventricular ejection fraction, by estimation, is 60 to 65%. The left ventricle has normal function. The left ventricle has no regional wall motion abnormalities. The left ventricular internal cavity size was normal in size. There is  moderate left ventricular hypertrophy. Left ventricular diastolic parameters are consistent with Grade I diastolic dysfunction (impaired relaxation). Normal left ventricular filling pressure. Right Ventricle: The right ventricular size is normal. No  increase in right ventricular wall thickness. Right ventricular systolic function is low normal. Left Atrium: Left  atrial size was mildly dilated. Right Atrium: Right atrial size was normal in size. Pericardium: There is no evidence of pericardial effusion. Mitral Valve: The mitral valve is abnormal. Moderate mitral annular calcification. Trivial mitral valve regurgitation. No evidence of mitral valve stenosis. Tricuspid Valve: The tricuspid valve is grossly normal. Tricuspid valve regurgitation is not demonstrated. Aortic Valve: The aortic valve has an indeterminant number of cusps. Aortic valve regurgitation is trivial. Severe aortic stenosis is present. Aortic valve mean gradient measures 59.0 mmHg. Aortic valve peak gradient measures 92.2 mmHg. Aortic valve area, by VTI measures 0.52 cm. Pulmonic Valve: The pulmonic valve was normal in structure. Pulmonic valve regurgitation is not visualized. Aorta: The aortic root is normal in size and structure. Venous: The inferior vena cava was not well visualized. IAS/Shunts: No atrial level shunt detected by color flow Doppler.  LEFT VENTRICLE PLAX 2D LVIDd:         4.00 cm   Diastology LVIDs:         2.80 cm   LV e' medial:    4.03 cm/s LV PW:         1.40 cm   LV E/e' medial:  39.2 LV IVS:        1.50 cm   LV e' lateral:   6.31 cm/s LVOT diam:     2.10 cm   LV E/e' lateral: 25.0 LV SV:         59 LV SV Index:   27 LVOT Area:     3.46 cm  RIGHT VENTRICLE RV S prime:     10.20 cm/s TAPSE (M-mode): 1.8 cm LEFT ATRIUM             Index        RIGHT ATRIUM           Index LA diam:        3.00 cm 1.38 cm/m   RA Area:     17.50 cm LA Vol (A2C):   90.7 ml 41.69 ml/m  RA Volume:   48.40 ml  22.25 ml/m LA Vol (A4C):   69.3 ml 31.86 ml/m LA Biplane Vol: 79.9 ml 36.73 ml/m  AORTIC VALVE AV Area (Vmax):    0.62 cm AV Area (Vmean):   0.51 cm AV Area (VTI):     0.52 cm AV Vmax:           480.00 cm/s AV Vmean:          371.000 cm/s AV VTI:            1.130 m AV Peak Grad:      92.2 mmHg AV Mean Grad:      59.0 mmHg LVOT Vmax:         85.50 cm/s LVOT Vmean:        55.100 cm/s LVOT VTI:           0.170 m LVOT/AV VTI ratio: 0.15  AORTA Ao Root diam: 3.00 cm MITRAL VALVE MV Area (PHT): 2.11 cm     SHUNTS MV Decel Time: 359 msec     Systemic VTI:  0.17 m MV E velocity: 158.00 cm/s  Systemic Diam: 2.10 cm MV A velocity: 152.00 cm/s MV E/A ratio:  1.04 Lyman Bishop MD Electronically signed by Lyman Bishop MD Signature Date/Time: 11/09/2021/4:11:06 PM    Final    MR ORBITS W WO CONTRAST  Result Date: 11/09/2021  CLINICAL DATA:  Provided history: Vision loss, monocular. EXAM: MRI HEAD AND ORBITS WITHOUT AND WITH CONTRAST TECHNIQUE: Multiplanar, multiecho pulse sequences of the brain and surrounding structures were obtained without and with intravenous contrast. Multiplanar, multiecho pulse sequences of the orbits and surrounding structures were obtained including fat saturation techniques, before and after intravenous contrast administration. CONTRAST:  10 mL Vueway intravenous contrast. COMPARISON:  Noncontrast head CT and CT angiogram head/neck 11/08/2021. FINDINGS: Intermittently motion degraded examination, somewhat limiting evaluation. Most notably, there is moderate motion degradation of the sagittal T1 weighted sequence, moderate to severe motion degradation of the coronal T1 weighted postcontrast sequence and severe motion degradation of the sagittal T1 weighted postcontrast sequence. Within this limitation, findings are as follows. MRI HEAD FINDINGS Brain: Generalized cerebral and cerebellar atrophy. Chronic hemorrhagic infarct within the right corona radiata/basal ganglia. Chronic small-vessel infarct within the left corona radiata/basal ganglia. Chronic lacunar infarcts within the right thalamus and within the central pons. Background mild-to-moderate multifocal T2 FLAIR hyperintense signal abnormality within the cerebral white matter, nonspecific but compatible with chronic small vessel disease. Several scattered chronic microhemorrhages within the supratentorial brain. Numerous tiny chronic  infarcts within both cerebellar hemispheres. There is no acute infarct. No evidence of an intracranial mass. No extra-axial fluid collection. No midline shift. No pathologic intracranial enhancement identified. Vascular: Maintained flow voids within the proximal large arterial vessels. Skull and upper cervical spine: No focal suspicious marrow lesion. MRI ORBITS FINDINGS Mild intermittent motion degradation. Orbits: The globes are normal in size and contour. Portions of the intraorbital left optic nerve appear atrophic posteriorly (for instance as seen on series 11, images 12-14). The extraocular muscles and lateral glands are symmetric and unremarkable. No intraorbital mass or pathologic intraorbital enhancement. Visualized sinuses: Mild paranasal sinus mucosal thickening, greatest within the bilateral ethmoid and left maxillary sinuses. Soft tissues: Unremarkable appearance of the visualized maxillofacial and upper neck soft tissues. IMPRESSION: MRI brain: 1. Motion degraded examination. 2. No evidence of acute intracranial abnormality. 3. Parenchymal atrophy, chronic small vessel disease and chronic infarcts as detailed. 4. Several chronic parenchymal microhemorrhages within the supratentorial brain. 5. Numerous tiny chronic infarcts within bilateral cerebellar hemispheres. MRI Orbits: 1. Mildly motion degraded exam. 2. Portions of the intraorbital left optic nerve appear atrophic. 3. No intraorbital mass or pathologic or intraorbital enhancement. 4. Mild paranasal sinus mucosal thickening. Electronically Signed   By: Kellie Simmering D.O.   On: 11/09/2021 12:38   MR BRAIN W WO CONTRAST  Result Date: 11/09/2021 CLINICAL DATA:  Provided history: Vision loss, monocular. EXAM: MRI HEAD AND ORBITS WITHOUT AND WITH CONTRAST TECHNIQUE: Multiplanar, multiecho pulse sequences of the brain and surrounding structures were obtained without and with intravenous contrast. Multiplanar, multiecho pulse sequences of the orbits  and surrounding structures were obtained including fat saturation techniques, before and after intravenous contrast administration. CONTRAST:  10 mL Vueway intravenous contrast. COMPARISON:  Noncontrast head CT and CT angiogram head/neck 11/08/2021. FINDINGS: Intermittently motion degraded examination, somewhat limiting evaluation. Most notably, there is moderate motion degradation of the sagittal T1 weighted sequence, moderate to severe motion degradation of the coronal T1 weighted postcontrast sequence and severe motion degradation of the sagittal T1 weighted postcontrast sequence. Within this limitation, findings are as follows. MRI HEAD FINDINGS Brain: Generalized cerebral and cerebellar atrophy. Chronic hemorrhagic infarct within the right corona radiata/basal ganglia. Chronic small-vessel infarct within the left corona radiata/basal ganglia. Chronic lacunar infarcts within the right thalamus and within the central pons. Background mild-to-moderate multifocal T2 FLAIR hyperintense signal abnormality  within the cerebral white matter, nonspecific but compatible with chronic small vessel disease. Several scattered chronic microhemorrhages within the supratentorial brain. Numerous tiny chronic infarcts within both cerebellar hemispheres. There is no acute infarct. No evidence of an intracranial mass. No extra-axial fluid collection. No midline shift. No pathologic intracranial enhancement identified. Vascular: Maintained flow voids within the proximal large arterial vessels. Skull and upper cervical spine: No focal suspicious marrow lesion. MRI ORBITS FINDINGS Mild intermittent motion degradation. Orbits: The globes are normal in size and contour. Portions of the intraorbital left optic nerve appear atrophic posteriorly (for instance as seen on series 11, images 12-14). The extraocular muscles and lateral glands are symmetric and unremarkable. No intraorbital mass or pathologic intraorbital enhancement. Visualized  sinuses: Mild paranasal sinus mucosal thickening, greatest within the bilateral ethmoid and left maxillary sinuses. Soft tissues: Unremarkable appearance of the visualized maxillofacial and upper neck soft tissues. IMPRESSION: MRI brain: 1. Motion degraded examination. 2. No evidence of acute intracranial abnormality. 3. Parenchymal atrophy, chronic small vessel disease and chronic infarcts as detailed. 4. Several chronic parenchymal microhemorrhages within the supratentorial brain. 5. Numerous tiny chronic infarcts within bilateral cerebellar hemispheres. MRI Orbits: 1. Mildly motion degraded exam. 2. Portions of the intraorbital left optic nerve appear atrophic. 3. No intraorbital mass or pathologic or intraorbital enhancement. 4. Mild paranasal sinus mucosal thickening. Electronically Signed   By: Kellie Simmering D.O.   On: 11/09/2021 12:38   CT ANGIO HEAD NECK W WO CM  Result Date: 11/08/2021 CLINICAL DATA:  Neuro deficit, acute, stroke suspected. Additional history provided: Sudden loss of vision in left eye. EXAM: CT ANGIOGRAPHY HEAD AND NECK TECHNIQUE: Multidetector CT imaging of the head and neck was performed using the standard protocol during bolus administration of intravenous contrast. Multiplanar CT image reconstructions and MIPs were obtained to evaluate the vascular anatomy. Carotid stenosis measurements (when applicable) are obtained utilizing NASCET criteria, using the distal internal carotid diameter as the denominator. RADIATION DOSE REDUCTION: This exam was performed according to the departmental dose-optimization program which includes automated exposure control, adjustment of the mA and/or kV according to patient size and/or use of iterative reconstruction technique. CONTRAST:  145mL OMNIPAQUE IOHEXOL 350 MG/ML SOLN COMPARISON:  Head CT 09/26/2011. FINDINGS: CT HEAD FINDINGS Brain: Generalized cerebral and cerebellar atrophy. Chronic lacunar infarct versus prominent perivascular space within  the left basal ganglia. Chronic lacunar infarct within the right basal ganglia. Chronic lacunar infarct within the right thalamus. Chronic infarct within the central pons. Background mild patchy and ill-defined hypoattenuation within the cerebral white matter, nonspecific but compatible with chronic small vessel image disease. There is no acute intracranial hemorrhage. No demarcated cortical infarct. No extra-axial fluid collection. No evidence of an intracranial mass. No midline shift. Vascular: No hyperdense vessel.  Atherosclerotic calcifications. Skull: No fracture or aggressive osseous lesion. Sinuses/Orbits: No mass or acute finding within the imaged orbits. Minimal mucosal thickening within the left maxillary sinus. Review of the MIP images confirms the above findings CTA NECK FINDINGS Motion degraded examination. Aortic arch: Common origin of the innominate and left common carotid arteries. Atherosclerotic plaque within the visualized aortic arch and proximal major branch vessels of the neck. Streak and beam hardening artifact arising from a dense left-sided contrast bolus partially obscures the left subclavian artery. Within this limitation, there is no appreciable hemodynamically significant innominate or proximal subclavian artery stenosis. Right carotid system: CCA and ICA patent within the neck without hemodynamically significant stenosis (50% or greater). Partially retropharyngeal course of the cervical ICA. Left  carotid system: CCA and ICA patent within the neck without hemodynamically significant stenosis (50% or greater). Mild atherosclerotic plaque about the carotid bifurcation and within the proximal ICA. Partially retropharyngeal course of the cervical ICA. Vertebral arteries: Venous reflux from a left-sided contrast bolus partially obscures the V1 and proximal V2 left vertebral artery. Within this limitation, the vertebral arteries are patent within the neck without appreciable hemodynamically  significant stenosis. Skeleton: No acute fracture or aggressive osseous lesion. Other neck: No neck mass or cervical lymphadenopathy. Upper chest: Suspected incompletely imaged pulmonary nodule within the right upper lobe, measuring at least 8 mm (series 11, image 303). Review of the MIP images confirms the above findings CTA HEAD FINDINGS Anterior circulation: ICA atherosclerotic plaque within both vessels. Most notably, there is up to moderate stenosis within the right cavernous segment and apparent moderate to severe stenosis within the left cavernous segment. The M1 middle cerebral arteries are patent. No M2 proximal branch occlusion or high-grade proximal stenosis. The anterior cerebral arteries are patent. Atherosclerotic irregularity of both anterior cerebral arteries without high-grade proximal stenosis. No intracranial aneurysm is identified. Posterior circulation: The intracranial vertebral arteries are patent. The basilar artery is patent. The posterior cerebral arteries are patent. Atherosclerotic irregularity of both vessels. Most notably, there is a moderate stenosis within the left P2 segment, severe stenosis within a left PCA branch at the P2/P3 junction, and severe stenosis within the right PCA branch at the P2/P3 junction. Venous sinuses: Within the limitations of contrast timing, no convincing thrombus. Anatomic variants: As described. Review of the MIP images confirms the above findings IMPRESSION: CT head: 1. No evidence of acute intracranial abnormality. 2. Parenchymal atrophy and progressive chronic small-vessel ischemic disease with chronic lacunar infarcts as described. CTA neck: 1. The common carotid and internal carotid arteries are patent within the neck without hemodynamically significant stenosis. Mild atherosclerotic plaque about the left carotid bifurcation and within the proximal left ICA. 2. Venous reflux from a left-sided contrast bolus partially obscures the V1 and proximal V2  left vertebral artery. Within this limitation, the cervical vertebral arteries are patent without hemodynamically significant stenosis. 3. Apparent partially imaged right upper lobe pulmonary nodule, measuring at least 8 mm, and concerning for possible pulmonary neoplasm. A chest CT is recommended for further evaluation. CTA head: 1. No intracranial large vessel occlusion is identified. 2. Intracranial atherosclerotic disease with multifocal stenoses, most notably as follows. 3. Moderate stenosis within the cavernous right ICA. 4. Moderate to severe stenosis within the cavernous left ICA. 5. Moderate stenosis within the left PCA P2 segment. 6. Severe stenoses within PCA branches at the P2/P3 junction, bilaterally. Electronically Signed   By: Kellie Simmering D.O.   On: 11/08/2021 15:18    Microbiology: Results for orders placed or performed during the hospital encounter of 11/08/21  Resp Panel by RT-PCR (Flu A&B, Covid) Anterior Nasal Swab     Status: None   Collection Time: 11/08/21 12:38 PM   Specimen: Anterior Nasal Swab  Result Value Ref Range Status   SARS Coronavirus 2 by RT PCR NEGATIVE NEGATIVE Final    Comment: (NOTE) SARS-CoV-2 target nucleic acids are NOT DETECTED.  The SARS-CoV-2 RNA is generally detectable in upper respiratory specimens during the acute phase of infection. The lowest concentration of SARS-CoV-2 viral copies this assay can detect is 138 copies/mL. A negative result does not preclude SARS-Cov-2 infection and should not be used as the sole basis for treatment or other patient management decisions. A negative result may occur with  improper specimen collection/handling, submission of specimen other than nasopharyngeal swab, presence of viral mutation(s) within the areas targeted by this assay, and inadequate number of viral copies(<138 copies/mL). A negative result must be combined with clinical observations, patient history, and epidemiological information. The expected  result is Negative.  Fact Sheet for Patients:  EntrepreneurPulse.com.au  Fact Sheet for Healthcare Providers:  IncredibleEmployment.be  This test is no t yet approved or cleared by the [place] FDA and  has been authorized for detection and/or diagnosis of SARS-CoV-2 by FDA under an Emergency Use Authorization (EUA). This EUA will remain  in effect (meaning this test can be used) for the duration of the COVID-19 declaration under Section 564(b)(1) of the Act, 21 U.S.C.section 360bbb-3(b)(1), unless the authorization is terminated  or revoked sooner.       Influenza A by PCR NEGATIVE NEGATIVE Final   Influenza B by PCR NEGATIVE NEGATIVE Final    Comment: (NOTE) The Xpert Xpress SARS-CoV-2/FLU/RSV plus assay is intended as an aid in the diagnosis of influenza from Nasopharyngeal swab specimens and should not be used as a sole basis for treatment. Nasal washings and aspirates are unacceptable for Xpert Xpress SARS-CoV-2/FLU/RSV testing.  Fact Sheet for Patients: EntrepreneurPulse.com.au  Fact Sheet for Healthcare Providers: IncredibleEmployment.be  This test is not yet approved or cleared by the [place] FDA and has been authorized for detection and/or diagnosis of SARS-CoV-2 by FDA under an Emergency Use Authorization (EUA). This EUA will remain in effect (meaning this test can be used) for the duration of the COVID-19 declaration under Section 564(b)(1) of the Act, 21 U.S.C. section 360bbb-3(b)(1), unless the authorization is terminated or revoked.  Performed at [facility], [address], [place], [place] [address]     Labs: CBC: Recent Labs  Lab 11/08/21 1238  WBC 4.5  NEUTROABS 2.5  HGB 11.9*  HCT 36.8  MCV 89.5  PLT 810   Basic Metabolic Panel: Recent Labs  Lab 11/08/21 1238  NA 139  K 4.0  CL 103  CO2 29  GLUCOSE 99  BUN 12  CREATININE 0.78   CALCIUM 9.6   Liver Function Tests: Recent Labs  Lab 11/08/21 1238  AST 13*  ALT 10  ALKPHOS 62  BILITOT 0.6  PROT 7.1  ALBUMIN 4.2   CBG: No results for input(s): "GLUCAP" in the last 168 hours.  Discharge time spent: approximately 35 minutes spent on discharge counseling, evaluation of patient on day of discharge, and coordination of discharge planning with nursing, social work, pharmacy and case management  Signed: Edwin Dada, MD [facility] 11/10/2021

## 2021-11-10 NOTE — TOC Transition Note (Signed)
Transition of Care (TOC) - CM/SW Discharge Note Marvetta Gibbons RN, BSN Transitions of Care Unit 4E- RN Case Manager See Treatment Team for direct phone #    Patient Details  Name: Andrea Mullen MRN: [id number] Date of Birth: 11-23-1965  Transition of Care [facility]) CM/SW Contact:  Dawayne Patricia, RN Phone Number: 11/10/2021, 12:01 PM   Clinical Narrative:    Pt stable for transition home today. HH orders placed for PT/OT.  CM spoke with pt at bedside regarding [place] needs. Pt is agreeable to services- list provided for [facility] choice Per CMS guidelines from medicare.gov website with star ratings (copy placed in shadow chart), pt states she does not have a preference, however request that this writer reach out to her daughter Jhonnie Garner to see if she has any idea about the [facility] agencies. Pt provided phone # [phone].   Per pt one of her daughter's will provide transportation home, she goes to [facility] for primary care needs.   Call made to daughter as per pt request- discussed [facility] agencies, and per Jhonnie Garner she is familiar with [facility] (now known as [facility])- if they are unable to accept, no further preference.   Call made to [facility] with [facility]- for HHPT/OT referral- after review, notified they are unable to accept at this time..  Call made to [facility] with [facility]- referral has been accepted for HHPT/OT- they will contact patient post discharge to schedule.   RNCM will sign off for now as intervention is no longer needed. Please re-consult  if new needs arise, or contact RNCM assigned to treatment team for further questions/concerns.      Final next level of care: [place] Barriers to Discharge: No Barriers Identified   Patient Goals and CMS Choice Patient states their goals for this hospitalization and ongoing recovery are:: return home CMS Medicare.gov Compare Post Acute Care list provided to:: Patient Choice offered to / list presented to :  Patient, Adult Children  Discharge Placement               Home w/ [facility]        Discharge Plan and Services   Discharge Planning Services: CM Consult Post Acute Care Choice: Home Health          DME Arranged: N/A DME Agency: NA       HH Arranged: PT, OT [place] Agency: [place] Date [place]: 11/10/21 Time [place] Agency Contacted: 1200 Representative spoke with at [place]: Tommi Rumps  Social Determinants of Health ([place]) Interventions     Readmission Risk Interventions     No data to display

## 2021-11-11 LAB — CBC WITH DIFFERENTIAL/PLATELET
Abs Immature Granulocytes: 0.01 10*3/uL (ref 0.00–0.07)
Basophils Absolute: 0 10*3/uL (ref 0.0–0.1)
Basophils Relative: 0 %
Eosinophils Absolute: 0.1 10*3/uL (ref 0.0–0.5)
Eosinophils Relative: 2 %
HCT: 38.8 % (ref 36.0–46.0)
Hemoglobin: 12.5 g/dL (ref 12.0–15.0)
Immature Granulocytes: 0 %
Lymphocytes Relative: 26 %
Lymphs Abs: 1.3 10*3/uL (ref 0.7–4.0)
MCH: 29.6 pg (ref 26.0–34.0)
MCHC: 32.2 g/dL (ref 30.0–36.0)
MCV: 91.7 fL (ref 80.0–100.0)
Monocytes Absolute: 0.5 10*3/uL (ref 0.1–1.0)
Monocytes Relative: 10 %
Neutro Abs: 3 10*3/uL (ref 1.7–7.7)
Neutrophils Relative %: 62 %
Platelets: 197 10*3/uL (ref 150–400)
RBC: 4.23 MIL/uL (ref 3.87–5.11)
RDW: 13.2 % (ref 11.5–15.5)
WBC: 4.9 10*3/uL (ref 4.0–10.5)
nRBC: 0 % (ref 0.0–0.2)

## 2021-11-11 LAB — COMPREHENSIVE METABOLIC PANEL
ALT: 15 U/L (ref 0–44)
AST: 17 U/L (ref 15–41)
Albumin: 3.8 g/dL (ref 3.5–5.0)
Alkaline Phosphatase: 64 U/L (ref 38–126)
Anion gap: 11 (ref 5–15)
BUN: 12 mg/dL (ref 6–20)
CO2: 26 mmol/L (ref 22–32)
Calcium: 9.5 mg/dL (ref 8.9–10.3)
Chloride: 103 mmol/L (ref 98–111)
Creatinine, Ser: 0.76 mg/dL (ref 0.44–1.00)
GFR, Estimated: >60 mL/min (ref >60)
Glucose, Bld: 123 mg/dL — ABNORMAL HIGH (ref 70–99)
Potassium: 4.4 mmol/L (ref 3.5–5.1)
Sodium: 140 mmol/L (ref 135–145)
Total Bilirubin: 0.5 mg/dL (ref 0.3–1.2)
Total Protein: 7.3 g/dL (ref 6.5–8.1)

## 2021-11-11 LAB — TYPE AND SCREEN
ABO/RH(D): B POS
Antibody Screen: NEGATIVE

## 2021-11-11 LAB — PROTIME-INR
INR: 1 (ref 0.8–1.2)
Prothrombin Time: 13.4 seconds (ref 11.4–15.2)

## 2021-11-11 LAB — ABO/RH: ABO/RH(D): B POS

## 2021-11-11 LAB — APTT: aPTT: 26 seconds (ref 24–36)

## 2021-11-11 NOTE — ED Notes (Signed)
X2 no response for vitals

## 2021-11-11 NOTE — ED Notes (Signed)
Patient complaining of chest cramping. Triage RN notifed

## 2021-11-12 ENCOUNTER — Telehealth: Payer: Self-pay | Admitting: Emergency Medicine

## 2021-11-12 NOTE — Telephone Encounter (Signed)
This patient needs an OV with Antonio Creswell, Icard or Meier to follow up CT findings. Thanks.

## 2021-11-12 NOTE — Telephone Encounter (Signed)
Attempted to call patient to schedule her visit to go over CT scan with [place], [facility] or [facility]. Unable to leave voicemail due to mailbox was full. Will try again.

## 2021-11-20 NOTE — Telephone Encounter (Signed)
Attempted to call pt to get her scheduled for an appt but unable to reach. Unable to leave VM as mailbox is full. Have sent pt a mychart message to have her call the office to get a consult appt scheduled with either Dr. Lamonte Sakai, Dr. Valeta Harms, or Dr. Verlee Monte.  Routing this to front desk pool for help as well.

## 2021-11-21 NOTE — Telephone Encounter (Signed)
ATC, VM remains full, unable to leave a message.  Unable to reach letter mailed.  Will await response from patient.

## 2021-12-10 NOTE — Progress Notes (Signed)
[facility] [address] [place] [address] [phone]       [place] Andrea Mullen Date of Birth:  11/11/1965 Medical Record Number:  [id number]   Reason for Referral:  hospital stroke follow up    SUBJECTIVE:   CHIEF COMPLAINT:  No chief complaint on file.   HPI:   Ms. Andrea Mullen is a 56 y.o. female with history of hypertension, hyperlipidemia, smoker, lung cancer status postchemotherapy and radiation who presented on 11/08/2021 with left eye vision loss 2 months ago with right arm numbness/pain and right leg weakness. Evaluated by Dr. 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 Placed on DAPT for 3 months then aspirin alone. LDL 92 - placed on Crestor 40mg  daily. Advised to continue to f/u with ophthalmology regarding L CRAO. CTA neck showed right upper lobe lung nodule with hx of lung cancer s/p chemo and radiation, CTA chest showed ***. Current tobacco use with smoking cessation counseling provided. Therapies recommended home health PT/OT.         PERTINENT IMAGING  Per hospitalization 11/09/2021 - *** CT no acute abnormality CTA head and neck bilateral ICA siphon, left P2 and bilateral P2/P3 stenosis. 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 2D Echo EF 60 to 65% LDL 182 HgbA1c 5.3    ROS:   14 system review of systems performed and negative with exception of ***  PMH:  Past Medical History:  Diagnosis Date   Anxiety    Asthma    Cancer ([place])    lung, tx radiation and chemo   Hyperlipemia    Hypertension     PSH:  Past Surgical History:  Procedure Laterality Date   CARDIAC CATHETERIZATION N/A 11/01/2014   Procedure: Left Heart Cath and Coronary Angiography;  Surgeon: Belva Crome, MD;  Location: [facility];  Service: Cardiovascular;  Laterality:  N/A;   CESAREAN SECTION     x3   HERNIA REPAIR      Social History:  Social History   Socioeconomic History   Marital status: Single    Spouse name: Not on file   Number of children: Not on file   Years of education: Not on file   Highest education level: Not on file  Occupational History   Not on file  Tobacco Use   Smoking status: Every Day    Packs/day: 0.50    Types: Cigarettes   Smokeless tobacco: Never  Vaping Use   Vaping Use: Never used  Substance and Sexual Activity   Alcohol use: Not Currently    Comment: Sober x 1 month   Drug use: Not Currently    Types: Marijuana, Cocaine    Comment: none x approx 1 month   Sexual activity: Not on file  Other Topics Concern   Not on file  Social History Narrative   Not on file   Social Determinants of Health   Financial Resource Strain: Not on file  Food Insecurity: Not on file  Transportation Needs: Not on file  Physical Activity: Not on file  Stress: Not on file  Social Connections: Not on file  Intimate Partner Violence: Not on file    Family History:  Family History  Problem Relation Age of Onset   Clotting disorder Mother     Medications:   Current Outpatient Medications on File Prior to  Visit  Medication Sig Dispense Refill   acetaminophen (TYLENOL) 500 MG tablet Take 1 tablet (500 mg total) by mouth every 6 (six) hours as needed. (Patient taking differently: Take 500 mg by mouth every 6 (six) hours as needed for mild pain.) 30 tablet 0   albuterol (VENTOLIN HFA) 108 (90 Base) MCG/ACT inhaler Inhale 2 puffs into the lungs every 6 (six) hours as needed for wheezing or shortness of breath.     aspirin EC 325 MG tablet Take 1 tablet (325 mg total) by mouth daily. 30 tablet 3   cholecalciferol (VITAMIN D3) 25 MCG (1000 UNIT) tablet Take 1,000 Units by mouth daily.     clopidogrel (PLAVIX) 75 MG tablet Take 1 tablet (75 mg total) by mouth daily. 30 tablet 2   rosuvastatin (CRESTOR) 40 MG tablet Take 1 tablet  (40 mg total) by mouth daily. 30 tablet 3   No current facility-administered medications on file prior to visit.    Allergies:   Allergies  Allergen Reactions   Prednisone Anaphylaxis    seizure   Bicillin C-R Hives   Ibuprofen Nausea Only   Nsaids Other (See Comments)    Hives    Penicillins Hives    Has patient had a PCN reaction causing immediate rash, facial/tongue/throat swelling, SOB or lightheadedness with hypotension: No Has patient had a PCN reaction causing severe rash involving mucus membranes or skin necrosis: Yes Has patient had a PCN reaction that required hospitalization No Has patient had a PCN reaction occurring within the last 10 years: Yes  If all of the above answers are "NO", then may proceed with Cephalosporin use.       OBJECTIVE:  Physical Exam  There were no vitals filed for this visit. There is no height or weight on file to calculate BMI. No results found.      No data to display           General: well developed, well nourished, seated, in no evident distress Head: head normocephalic and atraumatic.   Neck: supple with no carotid or supraclavicular bruits Cardiovascular: regular rate and rhythm, no murmurs Musculoskeletal: no deformity Skin:  no rash/petichiae Vascular:  Normal pulses all extremities   Neurologic Exam Mental Status: Awake and fully alert. Oriented to place and time. Recent and remote memory intact. Attention span, concentration and fund of knowledge appropriate. Mood and affect appropriate.  Cranial Nerves: Fundoscopic exam reveals sharp disc margins. Pupils equal, briskly reactive to light. Extraocular movements full without nystagmus. Visual fields full to confrontation. Hearing intact. Facial sensation intact. Face, tongue, palate moves normally and symmetrically.  Motor: Normal bulk and tone. Normal strength in all tested extremity muscles Sensory.: intact to touch , pinprick , position and vibratory sensation.   Coordination: Rapid alternating movements normal in all extremities. Finger-to-nose and heel-to-shin performed accurately bilaterally. Gait and Station: Arises from chair without difficulty. Stance is normal. Gait demonstrates normal stride length and balance with ***. Tandem walk and heel toe ***.  Reflexes: 1+ and symmetric. Toes downgoing.     NIHSS  *** Modified Rankin  ***      ASSESSMENT: Andrea Mullen is a 57 y.o. 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 Vascular risk factors include HTN, HLD, tobacco use and intracranial stenosis ***.      PLAN:  L BG/CR stroke L CRAO :  Residual deficit: ***.  Continue aspirin  325mg  daily and Plavix for total of 3 months then aspirin alone and rosuvastatin (Crestor) for secondary stroke prevention.   Discussed secondary stroke prevention measures and importance of close PCP follow up for aggressive stroke risk factor management including BP goal<130/90, HLD with LDL goal<70 and DM with A1c.<7 .  Stroke labs 11/2021: LDL 182, A1c 5.3 I have gone over the pathophysiology of stroke, warning signs and symptoms, risk factors and their management in some detail with instructions to go to the closest emergency room for symptoms of concern.     Follow up in *** or call earlier if needed   CC:  [facility] provider: Dr. Leonie Man PCP: [facility] [place] spent *** minutes of face-to-face and non-face-to-face time with patient.  This included previsit chart review including review of recent hospitalization, lab review, study review, order entry, electronic health record documentation, patient education regarding recent stroke including etiology, secondary stroke prevention measures and importance of managing stroke risk factors, residual deficits and typical recovery time and answered all other questions to patient  satisfaction   Frann Rider, AGNP-BC  [facility] [address] [place], [place] [address]  Phone [phone] Fax [phone] Note: This document was prepared with digital dictation and possible smart phrase technology. Any transcriptional errors that result from this process are unintentional.

## 2021-12-11 ENCOUNTER — Other Ambulatory Visit (HOSPITAL_COMMUNITY): Payer: Self-pay

## 2021-12-11 ENCOUNTER — Ambulatory Visit (INDEPENDENT_AMBULATORY_CARE_PROVIDER_SITE_OTHER): Payer: Medicaid Other | Admitting: Adult Health

## 2021-12-11 ENCOUNTER — Encounter: Payer: Self-pay | Admitting: Adult Health

## 2021-12-11 VITALS — BP 117/77 | HR 94 | Ht 64.0 in | Wt 237.4 lb

## 2021-12-11 DIAGNOSIS — H3412 Central retinal artery occlusion, left eye: Secondary | ICD-10-CM

## 2021-12-11 DIAGNOSIS — I69398 Other sequelae of cerebral infarction: Secondary | ICD-10-CM | POA: Diagnosis not present

## 2021-12-11 DIAGNOSIS — G8191 Hemiplegia, unspecified affecting right dominant side: Secondary | ICD-10-CM

## 2021-12-11 DIAGNOSIS — R2689 Other abnormalities of gait and mobility: Secondary | ICD-10-CM | POA: Diagnosis not present

## 2021-12-11 DIAGNOSIS — I6381 Other cerebral infarction due to occlusion or stenosis of small artery: Secondary | ICD-10-CM | POA: Diagnosis not present

## 2021-12-11 DIAGNOSIS — R269 Unspecified abnormalities of gait and mobility: Secondary | ICD-10-CM

## 2021-12-11 NOTE — Patient Instructions (Addendum)
Restart aspirin 325 mg daily and clopidogrel 75 mg daily  and Crestor 40 mg daily for secondary stroke prevention Continue plavix for additional 2 months then stop  You will be called by [facility] to start physical and occupational therapy   Follow-up with pulmonology as scheduled on 11/8  Continue to follow up with PCP regarding blood pressure and cholesterol management  Maintain strict control of hypertension with blood pressure goal below 130/90 and cholesterol with LDL cholesterol (bad cholesterol) goal below 70 mg/dL.   Signs of a Stroke? Follow the BEFAST method:  Balance Watch for a sudden loss of balance, trouble with coordination or vertigo Eyes Is there a sudden loss of vision in one or both eyes? Or double vision?  Face: Ask the person to smile. Does one side of the face droop or is it numb?  Arms: Ask the person to raise both arms. Does one arm drift downward? Is there weakness or numbness of a leg? Speech: Ask the person to repeat a simple phrase. Does the speech sound slurred/strange? Is the person confused ? Time: If you observe any of these signs, call 911.       Thank you for coming to see [place] at [facility]. I hope we have been able to provide you high quality care today.  You may receive a patient satisfaction survey over the next few weeks. We would appreciate your feedback and comments so that we may continue to improve ourselves and the health of our patients.

## 2021-12-12 ENCOUNTER — Telehealth: Payer: Self-pay | Admitting: *Deleted

## 2021-12-12 NOTE — Telephone Encounter (Signed)
[name] daughter calling on [employer], pt present. States pt needs refills of all meds. Advised PCP or prescriber would need to refill. Not on our system. Advised UC if needed, may be able to prescribe. Verbalizes understanding.

## 2021-12-16 ENCOUNTER — Other Ambulatory Visit (HOSPITAL_COMMUNITY): Payer: Self-pay

## 2021-12-17 ENCOUNTER — Institutional Professional Consult (permissible substitution): Payer: Medicaid Other | Admitting: Emergency Medicine

## 2021-12-18 ENCOUNTER — Ambulatory Visit (HOSPITAL_COMMUNITY)
Admission: EM | Admit: 2021-12-18 | Discharge: 2021-12-18 | Discharge status: Against Medical Advice | Payer: Medicaid Other | Attending: Family Medicine | Admitting: Family Medicine

## 2021-12-18 NOTE — Progress Notes (Deleted)
Cardiology Office Note:    Date:  12/18/2021   ID:  Andrea Mullen, DOB 14-Aug-1965, MRN [id number]  PCP:  [facility] [place] Providers Cardiologist:  None   Referring MD: Nolene Ebbs, MD    History of Present Illness:    Andrea Mullen is a 56 y.o. female with a hx of HTN, HLD, prior CVA, tobacco abuse, polysubstance abuse, anxiety and lung cancer s/p chemo/XRT who was referred by Dr. Jeanie Cooks for further evaluation of severe aortic stenosis.  Patient was admitted to [facility] from 11/08/21-11/10/21 for left eye vision loss x1 month. MRI showed basal ganglia/corona radiata stroke. CTA head and neck with significant atherosclerotic regions with multifocal areas of stenosis as noted in report below. TTE with LVEF 60-65% with severe aortic stenosis. Aortic valve area, by VTI measures 0.52 cm.  Aortic valve mean gradient measures 59.0 mmHg. Aortic valve Vmax measures 4.80 m/s. Peak gradient 92 mmHg. DI is 0.15.   Today, ***   Past Medical History:  Diagnosis Date   Anxiety    Asthma    Blind    left eye   Cancer ([place])    lung, tx radiation and chemo   Hyperlipemia    Hypertension    Stroke [facility])     Past Surgical History:  Procedure Laterality Date   CARDIAC CATHETERIZATION N/A 11/01/2014   Procedure: Left Heart Cath and Coronary Angiography;  Surgeon: Belva Crome, MD;  Location: [facility];  Service: Cardiovascular;  Laterality: N/A;   CESAREAN SECTION     x3   HERNIA REPAIR      Current Medications: No outpatient medications have been marked as taking for the 12/23/21 encounter (Appointment) with Freada Bergeron, MD.     Allergies:   Prednisone, Bicillin c-r, Ibuprofen, Nsaids, and Penicillins   Social History   Socioeconomic History   Marital status: Single    Spouse name: Not on file   Number of children: 7   Years of education: Not on file   Highest education level: Some college, no degree  Occupational History    Not on file  Tobacco Use   Smoking status: Every Day    Packs/day: 0.50    Types: Cigarettes   Smokeless tobacco: Never   Tobacco comments:    12/11/21 smoke 3 cigs a day  Vaping Use   Vaping Use: Never used  Substance and Sexual Activity   Alcohol use: Not Currently    Comment: Sober x 1 month   Drug use: Not Currently    Types: Marijuana, Cocaine    Comment: none x approx 1 month   Sexual activity: Not on file    Comment: 12/11/21 "clean x 3 months"  Other Topics Concern   Not on file  Social History Narrative   Lives with daughter   Social Determinants of Health   Financial Resource Strain: Not on file  Food Insecurity: Not on file  Transportation Needs: Not on file  Physical Activity: Not on file  Stress: Not on file  Social Connections: Not on file     Family History: The patient's ***family history includes Clotting disorder in her mother.  ROS:   Please see the history of present illness.    *** All other systems reviewed and are negative.  EKGs/Labs/Other Studies Reviewed:    The following studies were reviewed today: TTE November 22, 2021: IMPRESSIONS     1. Left ventricular ejection fraction, by estimation, is 60 to 65%.  The  left ventricle has normal function. The left ventricle has no regional  wall motion abnormalities. There is moderate left ventricular hypertrophy.  Left ventricular diastolic  parameters are consistent with Grade I diastolic dysfunction (impaired  relaxation).   2. Right ventricular systolic function is low normal. The right  ventricular size is normal.   3. Left atrial size was mildly dilated.   4. The mitral valve is abnormal. Trivial mitral valve regurgitation. No  evidence of mitral stenosis. Moderate mitral annular calcification.   5. The aortic valve has an indeterminant number of cusps and is heavily  calcified, possibly bicuspid. Aortic valve regurgitation is trivial.  Severe aortic valve stenosis. Aortic valve area, by VTI  measures 0.52 cm.  Aortic valve mean gradient measures  59.0 mmHg. Aortic valve Vmax measures 4.80 m/s. Peak gradient 92 mmHg. DI  is 0.15.   Comparison(s): Changes from prior study are noted. 10/31/2014: LVEF 55-60%,  mild AI.   CTA head 11/08/21: CTA head:   1. No intracranial large vessel occlusion is identified. 2. Intracranial atherosclerotic disease with multifocal stenoses, most notably as follows. 3. Moderate stenosis within the cavernous right ICA. 4. Moderate to severe stenosis within the cavernous left ICA. 5. Moderate stenosis within the left PCA P2 segment. 6. Severe stenoses within PCA branches at the P2/P3 junction, bilaterally.  EKG:  EKG is *** ordered today.  The ekg ordered today demonstrates ***  Recent Labs: 04/03/2021: TSH 1.80 11/10/2021: ALT 15; BUN 12; Creatinine, Ser 0.76; Hemoglobin 12.5; Platelets 197; Potassium 4.4; Sodium 140  Recent Lipid Panel    Component Value Date/Time   CHOL 237 (H) 11/08/2021 2307   TRIG 78 11/08/2021 2307   HDL 39 (L) 11/08/2021 2307   CHOLHDL 6.1 11/08/2021 2307   VLDL 16 11/08/2021 2307   LDLCALC 182 (H) 11/08/2021 2307   LDLCALC 174 (H) 04/03/2021 0000     Risk Assessment/Calculations:   {Does this patient have ATRIAL FIBRILLATION?:[id number]}  No BP recorded.  {Refresh Note OR Click here to enter BP  :1}***         Physical Exam:    VS:  LMP 11/16/2016     Wt Readings from Last 3 Encounters:  12/11/21 237 lb 6.4 oz (107.7 kg)  11/10/21 237 lb (107.5 kg)  11/08/21 237 lb 8 oz (107.7 kg)     GEN: *** Well nourished, well developed in no acute distress HEENT: Normal NECK: No JVD; No carotid bruits LYMPHATICS: No lymphadenopathy CARDIAC: ***RRR, no murmurs, rubs, gallops RESPIRATORY:  Clear to auscultation without rales, wheezing or rhonchi  ABDOMEN: Soft, non-tender, non-distended MUSCULOSKELETAL:  No edema; No deformity  SKIN: Warm and dry NEUROLOGIC:  Alert and oriented x 3 PSYCHIATRIC:  Normal  affect   ASSESSMENT:    No diagnosis found. PLAN:    In order of problems listed above:  #CVA: Patient with left eye vision loss found to have basal ganglia/corona radiata stroke as well as multiple territories of atherosclerotic large vessel disease. No known Afib. Follows with Neuro -Check 30 day monitor -Continue ASA 325mg  daily, plavix 75mg  daily -Continue crestor 40mg  daily  #Severe AS: TTE with LVEF 60-65% with severe AS (likely bicuspid AoV) with Aortic valve area, by VTI measures 0.52 cm.  Aortic valve mean gradient measures 59.0 mmHg. Aortic valve Vmax measures 4.80 m/s. Peak gradient 92 mmHg. DI is 0.15. Currently, *** -Refer to structural team (? If surgical candidate given recent stroke)  #HLD: -Continue crestor 40mg  daily      {  Are you ordering a CV Procedure (e.g. stress test, cath, DCCV, TEE, etc)?   Press F2        :[id number]}    Medication Adjustments/Labs and Tests Ordered: Current medicines are reviewed at length with the patient today.  Concerns regarding medicines are outlined above.  No orders of the defined types were placed in this encounter.  No orders of the defined types were placed in this encounter.   There are no Patient Instructions on file for this visit.   Signed, Freada Bergeron, MD  12/18/2021 11:46 AM    [place]

## 2021-12-19 ENCOUNTER — Other Ambulatory Visit (HOSPITAL_COMMUNITY): Payer: Self-pay

## 2021-12-23 ENCOUNTER — Ambulatory Visit: Payer: Medicaid Other | Admitting: Cardiology

## 2022-01-05 ENCOUNTER — Ambulatory Visit: Payer: Medicaid Other | Attending: Adult Health

## 2022-01-05 ENCOUNTER — Ambulatory Visit: Payer: Medicaid Other | Admitting: Occupational Therapy

## 2022-01-07 ENCOUNTER — Ambulatory Visit (INDEPENDENT_AMBULATORY_CARE_PROVIDER_SITE_OTHER): Payer: Medicaid Other | Admitting: Emergency Medicine

## 2022-01-07 ENCOUNTER — Encounter: Payer: Self-pay | Admitting: Emergency Medicine

## 2022-01-07 VITALS — BP 132/84 | HR 104 | Temp 98.4°F | Ht 64.5 in | Wt 235.0 lb

## 2022-01-07 DIAGNOSIS — I639 Cerebral infarction, unspecified: Secondary | ICD-10-CM

## 2022-01-07 DIAGNOSIS — R911 Solitary pulmonary nodule: Secondary | ICD-10-CM

## 2022-01-07 MED ORDER — ROSUVASTATIN CALCIUM 40 MG PO TABS: 40.0000 mg | ORAL_TABLET | Freq: Every day | ORAL | 3 refills | Status: AC

## 2022-01-07 MED ORDER — CLOPIDOGREL BISULFATE 75 MG PO TABS: 75.0000 mg | ORAL_TABLET | Freq: Every day | ORAL | 2 refills | Status: DC

## 2022-01-07 NOTE — Assessment & Plan Note (Signed)
High risk due to history of continued tobacco, prior small cell lung cancer that was treated in the [facility] system (with an amazing response).  We will repeat her CT in 3 months.  If there is interval growth, continued suspicion then I will recommend navigational bronchoscopy and biopsies.

## 2022-01-07 NOTE — Progress Notes (Signed)
Subjective:    Patient ID: Andrea Mullen, female    DOB: [birth year], 56 y.o.   MRN: [id number]  HPI 56 year old woman with history tobacco use (35-40 pack years), still smoking approximately 3-10 pack a day.  Also with hypertension, COPD/asthma, recent hospitalization for CVA.   She also carries a history of apparent small cell lung cancer that was noted and evaluated, treated at [facility] 2007 through 2009.  CT reports from that time noted a large infiltrative right hilar mass with right lower lobe mediastinal invasion, PA encasement and pleural extension.  Right middle lobe bronchial brushings showed small cell lung cancer.  I do not have full details on what, treatment was given but sounds like she received chemotherapy and XRT.  On imaging reported resolution.  CT scan in our system from 10/30/2014 showed no effusions, some anterior right upper lobe scar, no infiltrates, mediastinal mass or adenopathy.  She had a right upper lobe pulmonary nodule noted on a CT scan of the neck that was done during her recent admission for CVA.  This prompted dedicated CT chest.  She is here now to review findings on CT chest done 11/09/2021. She reports that she has stopped all of her meds, including plavix and ASA following her CVA. She is feeling well.   CT chest 11/09/2021 reviewed by me, shows no pathologically enlarged nodes.  The central airways are patent.  There is a solid 10 mm right upper lobe pulmonary nodule, 5 mm groundglass right upper lobe nodule.  Also noted subpleural linear opacities in the right upper lobe consistent with scar.   Review of Systems As per HPI  Past Medical History:  Diagnosis Date   Anxiety    Asthma    Blind    left eye   Cancer ([place])    lung, tx radiation and chemo   Hyperlipemia    Hypertension    Stroke ([place])      Family History  Problem Relation Age of Onset   Clotting disorder Mother      Social History   Socioeconomic History   Marital status: Single     Spouse name: Not on file   Number of children: 7   Years of education: Not on file   Highest education level: Some college, no degree  Occupational History   Not on file  Tobacco Use   Smoking status: Every Day    Packs/day: 0.50    Types: Cigarettes   Smokeless tobacco: Never   Tobacco comments:    smoke 3 cigs a day 01/07/22 [facility]   Vaping Use   Vaping Use: Never used  Substance and Sexual Activity   Alcohol use: Not Currently    Comment: Sober x 1 month   Drug use: Not Currently    Types: Marijuana, Cocaine    Comment: none x approx 1 month   Sexual activity: Not on file    Comment: 12/11/21 "clean x 3 months"  Other Topics Concern   Not on file  Social History Narrative   Lives with daughter   Social Determinants of Health   Financial Resource Strain: Not on file  Food Insecurity: Not on file  Transportation Needs: Not on file  Physical Activity: Not on file  Stress: Not on file  Social Connections: Not on file  Intimate Partner Violence: Not on file     Allergies  Allergen Reactions   Prednisone Anaphylaxis    seizure   Bicillin C-R Hives   Ibuprofen Nausea  Only   Nsaids Other (See Comments)    Hives    Penicillins Hives    Has patient had a PCN reaction causing immediate rash, facial/tongue/throat swelling, SOB or lightheadedness with hypotension: No Has patient had a PCN reaction causing severe rash involving mucus membranes or skin necrosis: Yes Has patient had a PCN reaction that required hospitalization No Has patient had a PCN reaction occurring within the last 10 years: Yes  If all of the above answers are "NO", then may proceed with Cephalosporin use.      Outpatient Medications Prior to Visit  Medication Sig Dispense Refill   acetaminophen (TYLENOL) 500 MG tablet Take 1 tablet (500 mg total) by mouth every 6 (six) hours as needed. (Patient not taking: Reported on 01/07/2022) 30 tablet 0   albuterol (VENTOLIN HFA) 108 (90 Base) MCG/ACT inhaler  Inhale 2 puffs into the lungs every 6 (six) hours as needed for wheezing or shortness of breath. (Patient not taking: Reported on 01/07/2022)     aspirin EC 325 MG tablet Take 1 tablet (325 mg total) by mouth daily. (Patient not taking: Reported on 01/07/2022) 30 tablet 3   cholecalciferol (VITAMIN D3) 25 MCG (1000 UNIT) tablet Take 1,000 Units by mouth daily. (Patient not taking: Reported on 01/07/2022)     clopidogrel (PLAVIX) 75 MG tablet Take 1 tablet (75 mg total) by mouth daily. (Patient not taking: Reported on 01/07/2022) 30 tablet 2   OVER THE COUNTER MEDICATION Flu and cold medicine (Patient not taking: Reported on 01/07/2022)     rosuvastatin (CRESTOR) 40 MG tablet Take 1 tablet (40 mg total) by mouth daily. (Patient not taking: Reported on 01/07/2022) 30 tablet 3   UNABLE TO FIND Med Name: preservision (Patient not taking: Reported on 01/07/2022)     No facility-administered medications prior to visit.         Objective:   Physical Exam Vitals:   01/07/22 1324  BP: 132/84  Pulse: (!) 104  Temp: 98.4 F (36.9 C)  TempSrc: Oral  SpO2: 99%  Weight: 235 lb (106.6 kg)  Height: 5' 4.5" (1.638 m)   Gen: Pleasant, obese woman, in no distress,  normal affect  ENT: No lesions,  mouth clear,  oropharynx clear, no postnasal drip  Neck: No JVD, no stridor  Lungs: No use of accessory muscles, no crackles or wheezing on normal respiration, no wheeze on forced expiration  Cardiovascular: RRR, heart sounds normal, no murmur or gallops, no peripheral edema  Musculoskeletal: No deformities, no cyanosis or clubbing  Neuro: alert, awake, non focal  Skin: Warm, no lesions or rashes       Assessment & Plan:   Pulmonary nodule High risk due to history of continued tobacco, prior small cell lung cancer that was treated in the [facility] system (with an amazing response).  We will repeat her CT in 3 months.  If there is interval growth, continued suspicion then I will recommend  navigational bronchoscopy and biopsies.  Stroke [facility]) Recent stroke.  She saw neurology at the beginning of this month and they wanted her to stay on aspirin plus Plavix for 3 months, then switch to aspirin alone.  She was also supposed to stay on rosuvastatin.  She ran out of the medications, was having some epistaxis when she was on aspirin and Plavix together.  I will refill the Plavix and the rosuvastatin so she can complete the appropriate course.   We will refill your Plavix.  Take once daily for 2 more  months and then stop. We will refill your rosuvastatin.  Neurology has recommended that you stay on this medication indefinitely. Restart your aspirin 325 mg once daily.   Baltazar Apo, MD, PhD 01/07/2022, 1:46 PM [facility] [phone] or if no answer before 7:00PM call [phone] For any issues after 7:00PM please call eLink [phone]

## 2022-01-07 NOTE — Patient Instructions (Addendum)
We reviewed your CT scan of the chest today.  We will plan to repeat your CT scan in early January 2024 to follow a small pulmonary nodule. We will refill your Plavix.  Take once daily for 2 more months and then stop. We will refill your rosuvastatin.  Neurology has recommended that you stay on this medication indefinitely. Restart your aspirin 325 mg once daily. Follow with Dr Lamonte Sakai in after your CT chest so we can review the results together.

## 2022-01-07 NOTE — Assessment & Plan Note (Signed)
Recent stroke.  She saw neurology at the beginning of this month and they wanted her to stay on aspirin plus Plavix for 3 months, then switch to aspirin alone.  She was also supposed to stay on rosuvastatin.  She ran out of the medications, was having some epistaxis when she was on aspirin and Plavix together.  I will refill the Plavix and the rosuvastatin so she can complete the appropriate course.   We will refill your Plavix.  Take once daily for 2 more months and then stop. We will refill your rosuvastatin.  Neurology has recommended that you stay on this medication indefinitely. Restart your aspirin 325 mg once daily.

## 2022-01-07 NOTE — Addendum Note (Signed)
Addended by: Gavin Potters R on: 01/07/2022 02:51 PM   Modules accepted: Orders

## 2022-01-22 ENCOUNTER — Ambulatory Visit: Payer: Medicaid Other | Attending: Adult Health | Admitting: Occupational Therapy

## 2022-01-22 ENCOUNTER — Ambulatory Visit: Payer: Medicaid Other

## 2022-01-29 ENCOUNTER — Emergency Department (HOSPITAL_COMMUNITY): Payer: Medicaid Other

## 2022-01-29 ENCOUNTER — Other Ambulatory Visit: Payer: Self-pay

## 2022-01-29 ENCOUNTER — Emergency Department (HOSPITAL_COMMUNITY)
Admission: EM | Admit: 2022-01-29 | Discharge: 2022-01-29 | Disposition: A | Discharge status: Home / Self Care | Payer: Medicaid Other | Attending: Emergency Medicine | Admitting: Emergency Medicine

## 2022-01-29 DIAGNOSIS — M25552 Pain in left hip: Secondary | ICD-10-CM | POA: Diagnosis not present

## 2022-01-29 DIAGNOSIS — R0789 Other chest pain: Secondary | ICD-10-CM | POA: Insufficient documentation

## 2022-01-29 DIAGNOSIS — Y92410 Unspecified street and highway as the place of occurrence of the external cause: Secondary | ICD-10-CM | POA: Insufficient documentation

## 2022-01-29 DIAGNOSIS — M25512 Pain in left shoulder: Secondary | ICD-10-CM | POA: Insufficient documentation

## 2022-01-29 MED ORDER — LIDOCAINE 5 % EX PTCH: 1.0000 | MEDICATED_PATCH | CUTANEOUS | 0 refills | Status: DC
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
## 2022-01-29 NOTE — ED Notes (Signed)
Pt able to move all extremities and stand w/o assistance. Pt denies numbness and tingling
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
## 2022-03-18 DIAGNOSIS — F17210 Nicotine dependence, cigarettes, uncomplicated: Secondary | ICD-10-CM | POA: Diagnosis not present

## 2022-03-18 DIAGNOSIS — R911 Solitary pulmonary nodule: Secondary | ICD-10-CM | POA: Diagnosis not present

## 2022-03-18 DIAGNOSIS — Z6841 Body Mass Index (BMI) 40.0 and over, adult: Secondary | ICD-10-CM | POA: Diagnosis not present

## 2022-03-18 DIAGNOSIS — E559 Vitamin D deficiency, unspecified: Secondary | ICD-10-CM | POA: Diagnosis not present

## 2022-03-18 DIAGNOSIS — R030 Elevated blood-pressure reading, without diagnosis of hypertension: Secondary | ICD-10-CM | POA: Diagnosis not present

## 2022-03-18 DIAGNOSIS — M25511 Pain in right shoulder: Secondary | ICD-10-CM | POA: Diagnosis not present

## 2022-03-18 DIAGNOSIS — C3490 Malignant neoplasm of unspecified part of unspecified bronchus or lung: Secondary | ICD-10-CM | POA: Diagnosis not present

## 2022-03-18 DIAGNOSIS — E7800 Pure hypercholesterolemia, unspecified: Secondary | ICD-10-CM | POA: Diagnosis not present

## 2022-03-27 IMAGING — DX DG KNEE COMPLETE 4+V*L*
4 series · 4 of 4 positions shown · non-contrast
Comparison: None.

CLINICAL DATA: Fall, left knee pain

EXAM:
LEFT KNEE - COMPLETE 4+ VIEW

[knee ap]
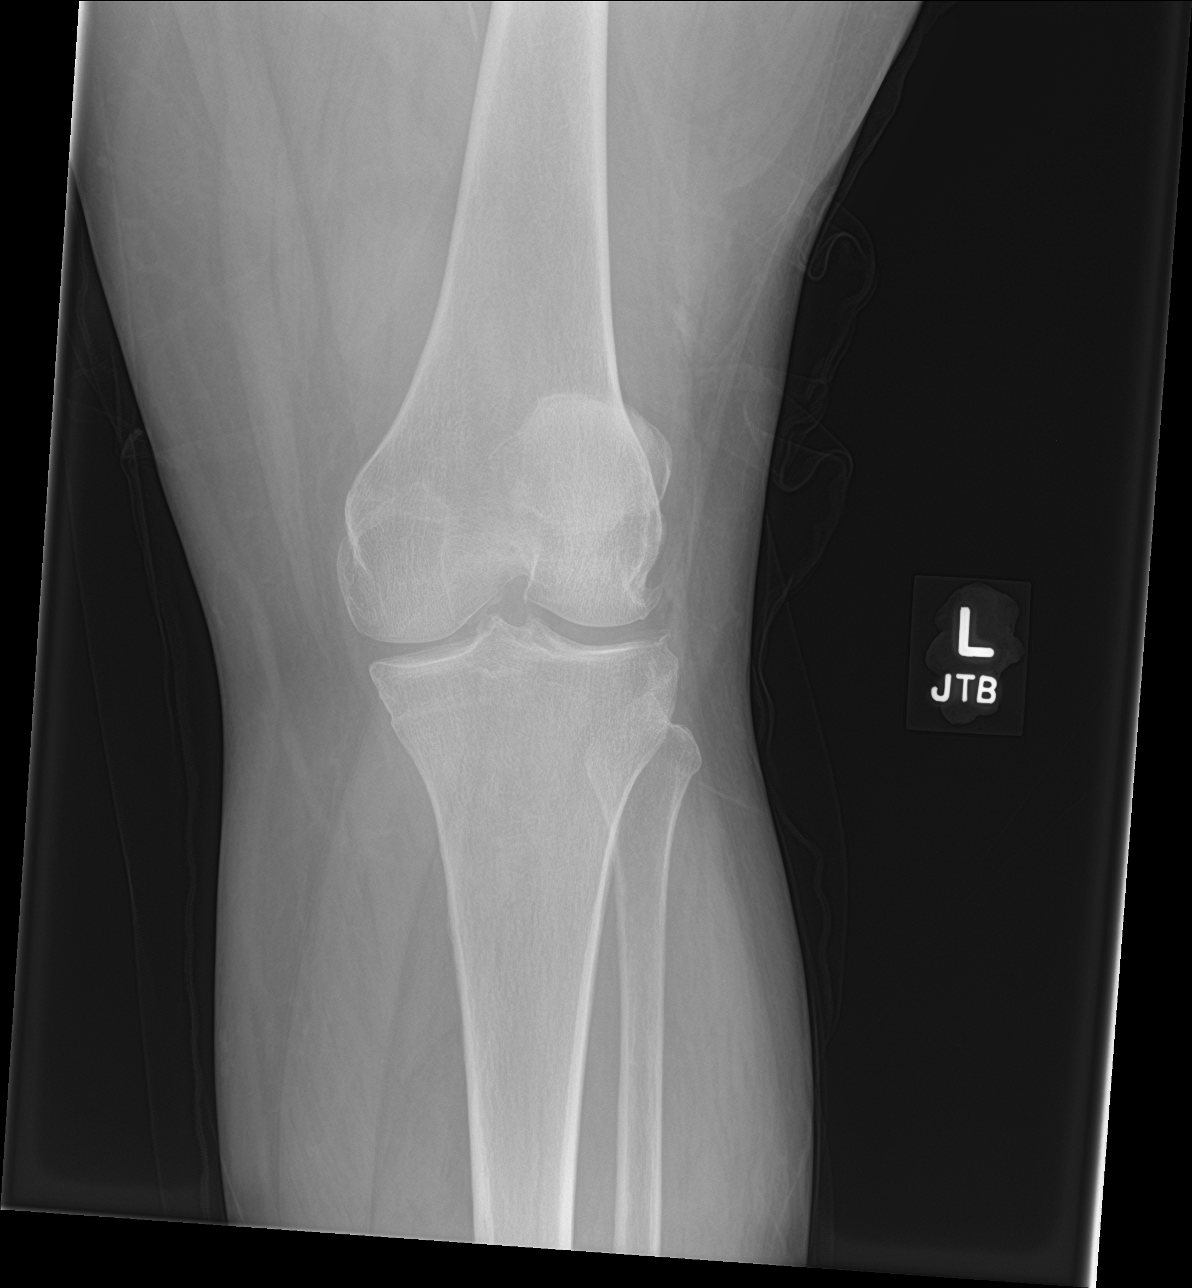

[knee lat]
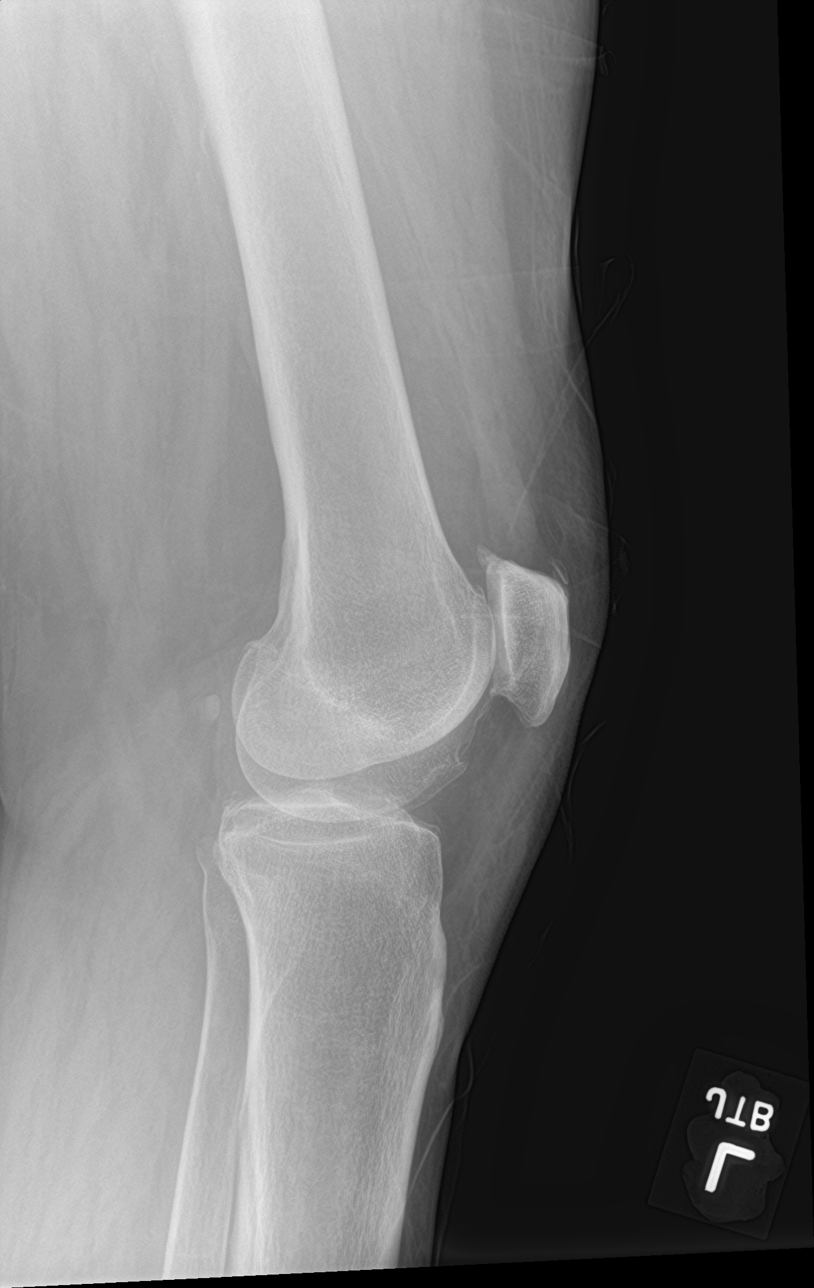

[knee obl (1 of 2)]
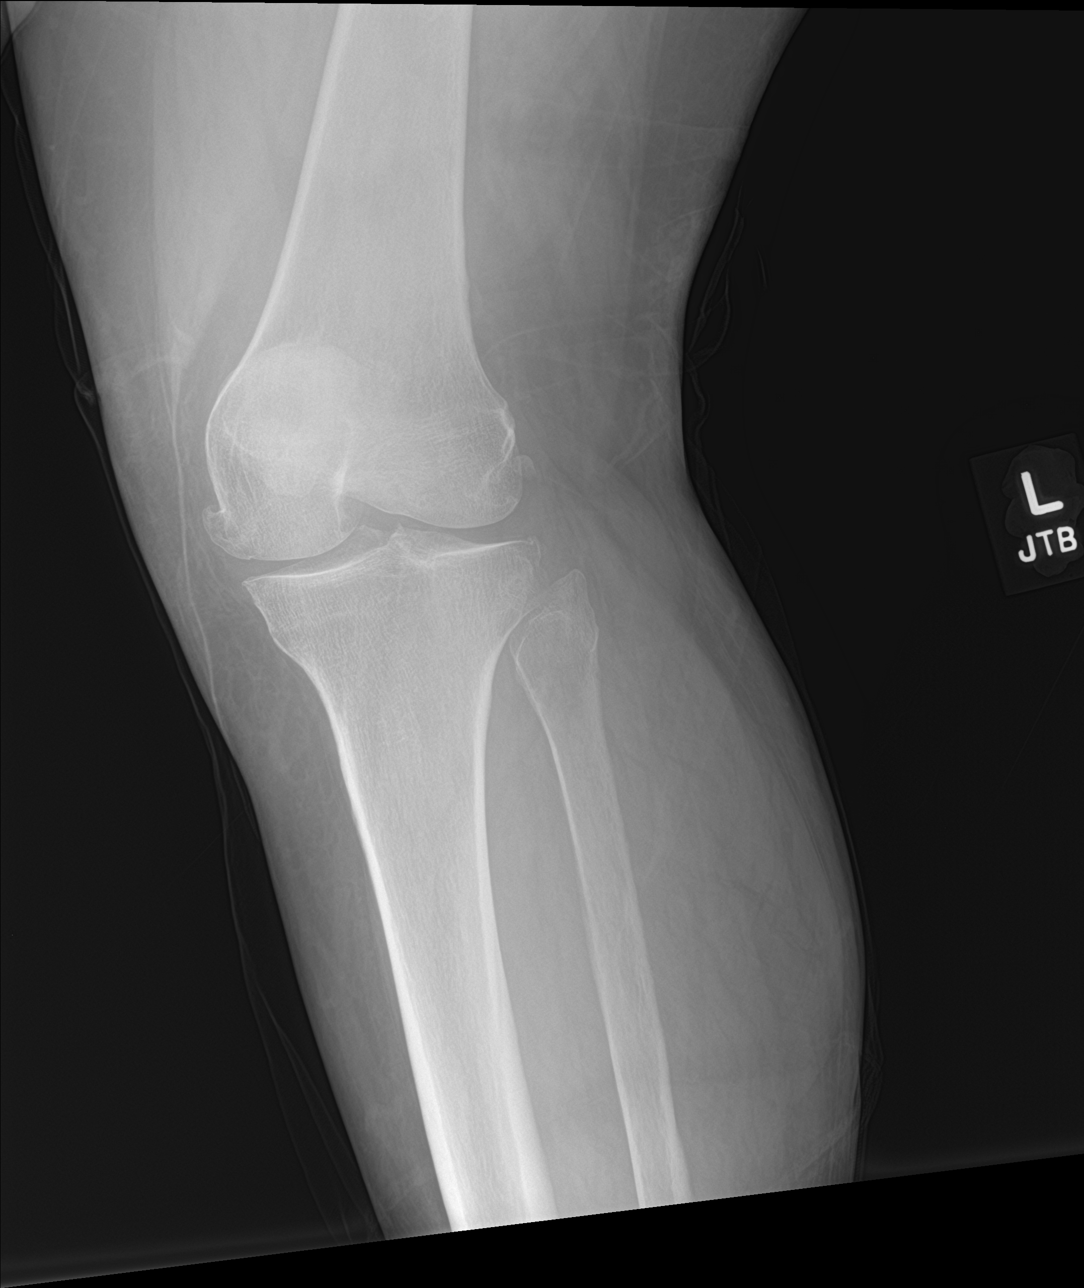

[knee obl (2 of 2)]
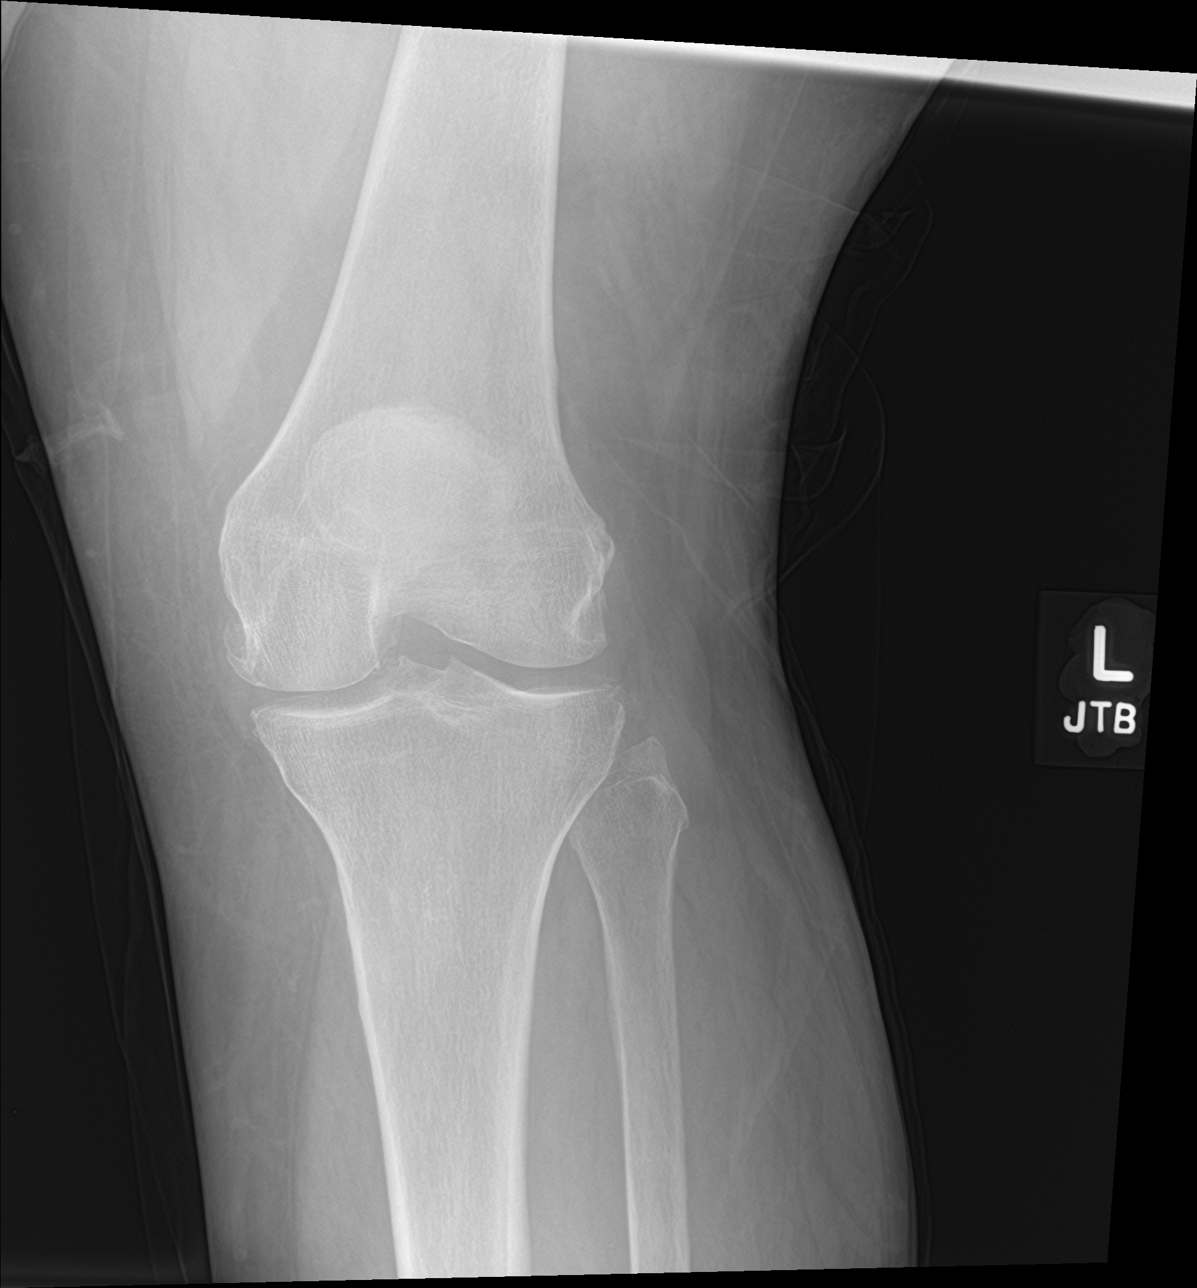

[4 of 4 positions shown; findings below may reference images not displayed]

FINDINGS: No evidence of fracture, dislocation, or joint effusion. Mild
tricompartmental joint space narrowing with marginal osteophytes.
Soft tissues are unremarkable.
IMPRESSION: 1.  No acute fracture or dislocation.

2.  Mild knee osteoarthritis.

## 2022-04-06 DIAGNOSIS — R222 Localized swelling, mass and lump, trunk: Secondary | ICD-10-CM | POA: Diagnosis not present

## 2022-04-06 DIAGNOSIS — R918 Other nonspecific abnormal finding of lung field: Secondary | ICD-10-CM | POA: Diagnosis not present

## 2022-04-06 DIAGNOSIS — Z0189 Encounter for other specified special examinations: Secondary | ICD-10-CM | POA: Diagnosis not present

## 2022-04-06 DIAGNOSIS — F17210 Nicotine dependence, cigarettes, uncomplicated: Secondary | ICD-10-CM | POA: Diagnosis not present

## 2022-04-06 DIAGNOSIS — C3490 Malignant neoplasm of unspecified part of unspecified bronchus or lung: Secondary | ICD-10-CM | POA: Diagnosis not present

## 2022-04-06 DIAGNOSIS — R942 Abnormal results of pulmonary function studies: Secondary | ICD-10-CM | POA: Diagnosis not present

## 2022-05-14 ENCOUNTER — Telehealth: Payer: Self-pay | Admitting: Emergency Medicine

## 2022-05-14 NOTE — Telephone Encounter (Signed)
Pt called the office to schedule a follow up appt. She also mentioned that she thought a bronch was going to be scheduled as well to take a look at the nodule that she had.  Looked at pt's last visit with [place] 12/2021 and saw that pt was to have a repeat CT and then follow up after this with RB. Asked pt if she had a repeat CT and pt first said that she had one done at [facility] but then said that she does not know if she has had a repeat one. Said that her memory is not that great after recent stroke that she had.  Dr. Lamonte Sakai, please advise if you do still want pt to have a CT prior to having a repeat OV. Do not see where an order was placed after last appt with you.

## 2022-05-14 NOTE — Telephone Encounter (Signed)
Not see a repeat CT chest in our system or in canopy.  Unless she has 1 that she can bring from [facility] for me to review then we need to arrange for a stat CT chest and then follow-up with me

## 2022-05-15 NOTE — Telephone Encounter (Signed)
Called the pt and there was no answer- LMTCB    

## 2022-05-21 NOTE — Telephone Encounter (Signed)
Attempted to call pt but unable to reach. Unable to leave VM due to mailbox being full. Due to multiple attempts trying to reach pt, encounter will be closed per protocol.

## 2022-06-04 ENCOUNTER — Ambulatory Visit (INDEPENDENT_AMBULATORY_CARE_PROVIDER_SITE_OTHER): Payer: 59 | Admitting: Emergency Medicine

## 2022-06-04 ENCOUNTER — Encounter: Payer: Self-pay | Admitting: Emergency Medicine

## 2022-06-04 VITALS — BP 158/78 | HR 91 | Temp 98.1°F | Ht 64.0 in | Wt 238.8 lb

## 2022-06-04 DIAGNOSIS — R911 Solitary pulmonary nodule: Secondary | ICD-10-CM

## 2022-06-04 DIAGNOSIS — J4540 Moderate persistent asthma, uncomplicated: Secondary | ICD-10-CM | POA: Diagnosis not present

## 2022-06-04 MED ORDER — SPIRIVA RESPIMAT 1.25 MCG/ACT IN AERS: 2.0000 | INHALATION_SPRAY | Freq: Every day | RESPIRATORY_TRACT | 0 refills | Status: DC

## 2022-06-04 NOTE — Progress Notes (Signed)
Subjective:    Patient ID: Andrea Mullen, female    DOB: 1965-06-16, 57 y.o.   MRN: [id number]  HPI 57 year old woman with history tobacco use (35-40 pack years), still smoking approximately 3-10 pack a day.  Also with hypertension, COPD/asthma, recent hospitalization for CVA.   She also carries a history of apparent small cell lung cancer that was noted and evaluated, treated at [facility] 2007 through 2009.  CT reports from that time noted a large infiltrative right hilar mass with right lower lobe mediastinal invasion, PA encasement and pleural extension.  Right middle lobe bronchial brushings showed small cell lung cancer.  I do not have full details on what, treatment was given but sounds like she received chemotherapy and XRT.  On imaging reported resolution.  CT scan in our system from 10/30/2014 showed no effusions, some anterior right upper lobe scar, no infiltrates, mediastinal mass or adenopathy.  She had a right upper lobe pulmonary nodule noted on a CT scan of the neck that was done during her recent admission for CVA.  This prompted dedicated CT chest.  She is here now to review findings on CT chest done 11/09/2021. She reports that she has stopped all of her meds, including plavix and ASA following her CVA. She is feeling well.   CT chest 11/09/2021 reviewed by me, shows no pathologically enlarged nodes.  The central airways are patent.  There is a solid 10 mm right upper lobe pulmonary nodule, 5 mm groundglass right upper lobe nodule.  Also noted subpleural linear opacities in the right upper lobe consistent with scar.   Acute OV 06/04/22 --57 year old woman with a history of tobacco use, COPD/asthma, history of CVA, small cell lung cancer [facility] 2000 08-2007).  I saw her in 12/2021 for an abnormal CT scan of the chest with a 10 mm right upper lobe pulmonary nodule, 5 mm groundglass right upper lobe nodule and some subpleural linear opacities in the right upper lobe consistent with scar.  We had  planned to repeat her CT chest but that has not been done. She reports today that she has been experiencing URI sx since last week, cough and congestion. Has had increased SOB, especially when she lays down at night. Has heard some wheeze. Has used her albuterol about 2x a day with some relief. She is allergic to prednisone.    Review of Systems As per HPI  Past Medical History:  Diagnosis Date   Anxiety    Asthma    Blind    left eye   Cancer    lung, tx radiation and chemo   Hyperlipemia    Hypertension    Stroke      Family History  Problem Relation Age of Onset   Clotting disorder Mother      Social History   Socioeconomic History   Marital status: Single    Spouse name: Not on file   Number of children: 7   Years of education: Not on file   Highest education level: Some college, no degree  Occupational History   Not on file  Tobacco Use   Smoking status: Every Day    Packs/day: .5    Types: Cigarettes   Smokeless tobacco: Never   Tobacco comments:    Pt stopped smoking 06/04/22 [facility]   Vaping Use   Vaping Use: Never used  Substance and Sexual Activity   Alcohol use: Not Currently    Comment: Sober x 1 month   Drug use:  Not Currently    Types: Marijuana, Cocaine    Comment: none x approx 1 month   Sexual activity: Not on file    Comment: 12/11/21 "clean x 3 months"  Other Topics Concern   Not on file  Social History Narrative   Lives with daughter   Social Determinants of Health   Financial Resource Strain: Not on file  Food Insecurity: Not on file  Transportation Needs: Not on file  Physical Activity: Not on file  Stress: Not on file  Social Connections: Not on file  Intimate Partner Violence: Not on file     Allergies  Allergen Reactions   Prednisone Anaphylaxis    seizure   Bicillin C-R Hives   Ibuprofen Nausea Only   Nsaids Other (See Comments)    Hives    Penicillins Hives    Has patient had a PCN reaction causing immediate rash,  facial/tongue/throat swelling, SOB or lightheadedness with hypotension: No Has patient had a PCN reaction causing severe rash involving mucus membranes or skin necrosis: Yes Has patient had a PCN reaction that required hospitalization No Has patient had a PCN reaction occurring within the last 10 years: Yes  If all of the above answers are "NO", then may proceed with Cephalosporin use.      Outpatient Medications Prior to Visit  Medication Sig Dispense Refill   rosuvastatin (CRESTOR) 40 MG tablet Take 1 tablet (40 mg total) by mouth daily. 30 tablet 3   acetaminophen (TYLENOL) 500 MG tablet Take 1 tablet (500 mg total) by mouth every 6 (six) hours as needed. (Patient not taking: Reported on 06/04/2022) 30 tablet 0   albuterol (VENTOLIN HFA) 108 (90 Base) MCG/ACT inhaler Inhale 2 puffs into the lungs every 6 (six) hours as needed for wheezing or shortness of breath. (Patient not taking: Reported on 06/04/2022)     aspirin EC 325 MG tablet Take 1 tablet (325 mg total) by mouth daily. (Patient not taking: Reported on 06/04/2022) 30 tablet 3   cholecalciferol (VITAMIN D3) 25 MCG (1000 UNIT) tablet Take 1,000 Units by mouth daily. (Patient not taking: Reported on 06/04/2022)     clopidogrel (PLAVIX) 75 MG tablet Take 1 tablet (75 mg total) by mouth daily. (Patient not taking: Reported on 06/04/2022) 30 tablet 2   lidocaine (LIDODERM) 5 % Place 1 patch onto the skin daily. Remove & Discard patch within 12 hours or as directed by MD (Patient not taking: Reported on 06/04/2022) 7 patch 0   OVER THE COUNTER MEDICATION Flu and cold medicine (Patient not taking: Reported on 06/04/2022)     UNABLE TO FIND Med Name: preservision (Patient not taking: Reported on 06/04/2022)     No facility-administered medications prior to visit.         Objective:   Physical Exam Vitals:   06/04/22 1408  BP: (!) 158/78  Pulse: 91  Temp: 98.1 F (36.7 C)  TempSrc: Oral  SpO2: 100%  Weight: 238 lb 12.8 oz (108.3 kg)   Height: 5\' 4"  (1.626 m)   Gen: Pleasant, obese woman, in no distress,  normal affect  ENT: No lesions,  mouth clear,  oropharynx clear, no postnasal drip  Neck: No JVD, no stridor  Lungs: No use of accessory muscles, bilateral low pitched wheeze on expiration  Cardiovascular: RRR, heart sounds normal, no murmur or gallops, no peripheral edema  Musculoskeletal: No deformities, no cyanosis or clubbing  Neuro: alert, awake, non focal  Skin: Warm, no lesions or rashes  Assessment & Plan:   Asthma, chronic COPD with progression of her dyspnea, more so recently in the setting of either allergies or an upper respiratory infection.  She does have wheezing on exam.  Unfortunately she does not tolerate prednisone.  I will give her azithromycin.  She is not on any maintenance medication for cost reasons.  We will restart Spiriva Respimat.  Continue albuterol as needed.  Pulmonary nodule Scattered pulmonary nodules and a high risk patient, tobacco use, history of small cell lung cancer.  She has not had her interval repeat CT chest.  She needs want a soon as possible.  Will compare this to October and decide whether she needs a diagnostic procedure.   Levy Pupa, MD, PhD 06/04/2022, 2:37 PM [facility] [phone] or if no answer before 7:00PM call [phone] For any issues after 7:00PM please call eLink [phone]

## 2022-06-04 NOTE — Addendum Note (Signed)
Addended by: Dorisann Frames R on: 06/04/2022 03:11 PM   Modules accepted: Orders

## 2022-06-04 NOTE — Assessment & Plan Note (Signed)
Scattered pulmonary nodules and a high risk patient, tobacco use, history of small cell lung cancer.  She has not had her interval repeat CT chest.  She needs want a soon as possible.  Will compare this to October and decide whether she needs a diagnostic procedure.

## 2022-06-04 NOTE — Assessment & Plan Note (Signed)
COPD with progression of her dyspnea, more so recently in the setting of either allergies or an upper respiratory infection.  She does have wheezing on exam.  Unfortunately she does not tolerate prednisone.  I will give her azithromycin.  She is not on any maintenance medication for cost reasons.  We will restart Spiriva Respimat.  Continue albuterol as needed.

## 2022-06-04 NOTE — Patient Instructions (Signed)
Please take azithromycin as directed until completely gone. We cannot give prednisone due to your allergy. Please start Spiriva Respimat, 2 sprays once daily.  Take this every day on a schedule as a maintenance medication. Keep your albuterol available to use 2 puffs when needed for shortness of breath, chest tightness, wheezing. We will repeat your CT scan of the chest to compare with your prior scan from October. Follow Dr. Delton Coombes next available after your CT chest so we can review the results together.

## 2023-02-06 ENCOUNTER — Encounter (HOSPITAL_BASED_OUTPATIENT_CLINIC_OR_DEPARTMENT_OTHER): Payer: Self-pay | Admitting: Emergency Medicine

## 2023-02-06 ENCOUNTER — Other Ambulatory Visit: Payer: Self-pay

## 2023-02-06 ENCOUNTER — Emergency Department (HOSPITAL_BASED_OUTPATIENT_CLINIC_OR_DEPARTMENT_OTHER)
Admission: EM | Admit: 2023-02-06 | Discharge: 2023-02-06 | Disposition: A | Discharge status: Home / Self Care | Payer: 59

## 2023-02-06 DIAGNOSIS — Z7982 Long term (current) use of aspirin: Secondary | ICD-10-CM | POA: Diagnosis not present

## 2023-02-06 DIAGNOSIS — M79674 Pain in right toe(s): Secondary | ICD-10-CM

## 2023-02-06 DIAGNOSIS — Z7902 Long term (current) use of antithrombotics/antiplatelets: Secondary | ICD-10-CM | POA: Insufficient documentation

## 2023-02-06 DIAGNOSIS — I2510 Atherosclerotic heart disease of native coronary artery without angina pectoris: Secondary | ICD-10-CM | POA: Diagnosis not present

## 2023-02-06 DIAGNOSIS — M79671 Pain in right foot: Secondary | ICD-10-CM | POA: Insufficient documentation

## 2023-02-06 DIAGNOSIS — M79672 Pain in left foot: Secondary | ICD-10-CM | POA: Diagnosis not present

## 2023-02-06 DIAGNOSIS — Z8673 Personal history of transient ischemic attack (TIA), and cerebral infarction without residual deficits: Secondary | ICD-10-CM | POA: Insufficient documentation

## 2023-02-06 DIAGNOSIS — Z85828 Personal history of other malignant neoplasm of skin: Secondary | ICD-10-CM | POA: Insufficient documentation

## 2023-02-06 DIAGNOSIS — L609 Nail disorder, unspecified: Secondary | ICD-10-CM | POA: Diagnosis not present

## 2023-02-06 MED ORDER — ACETAMINOPHEN 500 MG PO TABS: 1000.0000 mg | ORAL_TABLET | Freq: Once | ORAL | Status: DC

## 2023-02-06 NOTE — Discharge Instructions (Addendum)
Evaluation today was overall reassuring.  I did see multiple elongated and twisted toenails.  This is likely the source of your pain and discomfort.  Ultimately need to be seen by a foot doctor.  I provided their contact information in your discharge summary.  Please schedule appointment as soon as you are available.

## 2023-02-06 NOTE — ED Provider Notes (Signed)
EMERGENCY DEPARTMENT AT [facility] Provider Note   CSN: [id number] Arrival date & time: 02/06/23  1423     History  Chief Complaint  Patient presents with   Nail Problem   HPI Andrea Mullen is a 57 y.o. female with history of CAD, NSTEMI, stroke, cancer presenting for hardened and long toenails.  States her toenails have become so long it has been difficult to walk without pain.  States they only hurt when she is walking or bearing weight.  Denies any trauma to the toes.  States she has a hard time reaching her toenails making it difficult for her to cut them or file them.  Denies fever or chills.  HPI     Home Medications Prior to Admission medications   Medication Sig Start Date End Date Taking? Authorizing Provider  acetaminophen (TYLENOL) 500 MG tablet Take 1 tablet (500 mg total) by mouth every 6 (six) hours as needed. Patient not taking: Reported on 06/04/2022 05/20/17   Wynetta Fines, MD  albuterol (VENTOLIN HFA) 108 (90 Base) MCG/ACT inhaler Inhale 2 puffs into the lungs every 6 (six) hours as needed for wheezing or shortness of breath. Patient not taking: Reported on 06/04/2022    [provider]  aspirin EC 325 MG tablet Take 1 tablet (325 mg total) by mouth daily. Patient not taking: Reported on 06/04/2022 11/11/21   Alberteen Sam, MD  cholecalciferol (VITAMIN D3) 25 MCG (1000 UNIT) tablet Take 1,000 Units by mouth daily. Patient not taking: Reported on 06/04/2022    [provider]  clopidogrel (PLAVIX) 75 MG tablet Take 1 tablet (75 mg total) by mouth daily. Patient not taking: Reported on 06/04/2022 01/07/22   Leslye Peer, MD  lidocaine (LIDODERM) 5 % Place 1 patch onto the skin daily. Remove & Discard patch within 12 hours or as directed by MD Patient not taking: Reported on 06/04/2022 01/29/22   Rondel Baton, MD  OVER THE COUNTER MEDICATION Flu and cold medicine Patient not taking: Reported on 06/04/2022     [provider]  rosuvastatin (CRESTOR) 40 MG tablet Take 1 tablet (40 mg total) by mouth daily. 01/07/22   Leslye Peer, MD  Tiotropium Bromide Monohydrate (SPIRIVA RESPIMAT) 1.25 MCG/ACT AERS Inhale 2 puffs into the lungs daily. 06/04/22   Leslye Peer, MD  UNABLE TO FIND Med Name: preservision Patient not taking: Reported on 06/04/2022    [provider]      Allergies    Prednisone, Bicillin c-r, Ibuprofen, Nsaids, and Penicillins    Review of Systems   See HPI for pertinent positives  Physical Exam Updated Vital Signs BP (!) 122/92 (BP Location: Right Arm)   Pulse (!) 109   Temp 97.7 F (36.5 C)   Resp 19   LMP 11/16/2016   SpO2 99%  Physical Exam Constitutional:      Appearance: Normal appearance.  HENT:     Head: Normocephalic.     Nose: Nose normal.  Eyes:     Conjunctiva/sclera: Conjunctivae normal.  Pulmonary:     Effort: Pulmonary effort is normal.  Feet:     Comments: Multiple elongated and some tortuous toenails about most of the toes in both feet.  No associated erythema, edema.  Not hot to touch.  Pedal pulses are 2+.  Patient able to ambulate and bear weight. Neurological:     Mental Status: She is alert.  Psychiatric:        Mood and  Affect: Mood normal.     ED Results / Procedures / Treatments   Labs (all labs ordered are listed, but only abnormal results are displayed) Labs Reviewed - No data to display  EKG None  Radiology No results found.  Procedures Procedures    Medications Ordered in ED Medications  acetaminophen (TYLENOL) tablet 1,000 mg (has no administration in time range)    ED Course/ Medical Decision Making/ A&P                                 Medical Decision Making  57 year old well-appearing female presenting for toe pain.  Exam notable for supple elongated and tortuous toenails.  This is likely the etiology of her symptoms.  Findings do not suggest associated infection.  Advised her to  follow-up with a podiatrist.  Provided contact information for local podiatrist in her discharge summary.  Discharged in good condition.        Final Clinical Impression(s) / ED Diagnoses Final diagnoses:  Pain in toes of both feet    Rx / DC Orders ED Discharge Orders     None         Gareth Eagle, PA-C 02/06/23 1625    Durwin Glaze, MD 02/06/23 2218

## 2023-02-06 NOTE — ED Triage Notes (Signed)
Pt has long/hardened toenails on her left foot that have progressed to point she can't walk without pain.

## 2023-02-06 NOTE — ED Notes (Signed)
Pt toenails appear long bilaterally. Pt reports pain with ambulation. Requesting trim of nails, skin is not broken, is warm and dry, color within normal limits.

## 2023-02-09 ENCOUNTER — Ambulatory Visit (INDEPENDENT_AMBULATORY_CARE_PROVIDER_SITE_OTHER): Payer: 59 | Admitting: Podiatry

## 2023-02-09 ENCOUNTER — Encounter: Payer: Self-pay | Admitting: Podiatry

## 2023-02-09 DIAGNOSIS — M79675 Pain in left toe(s): Secondary | ICD-10-CM

## 2023-02-09 DIAGNOSIS — R601 Generalized edema: Secondary | ICD-10-CM

## 2023-02-09 DIAGNOSIS — B351 Tinea unguium: Secondary | ICD-10-CM

## 2023-02-09 DIAGNOSIS — I739 Peripheral vascular disease, unspecified: Secondary | ICD-10-CM | POA: Diagnosis not present

## 2023-02-09 DIAGNOSIS — M79674 Pain in right toe(s): Secondary | ICD-10-CM | POA: Diagnosis not present

## 2023-02-09 DIAGNOSIS — L602 Onychogryphosis: Secondary | ICD-10-CM

## 2023-02-09 NOTE — Progress Notes (Signed)
 Subjective:  Patient ID: Andrea Mullen, female    DOB: Mar 13, 1965,  MRN: [id number]  Andrea Mullen presents to clinic today for:  Chief Complaint  Patient presents with   Nail Problem    Est Pt: Nails severely long/ RFC   Patient notes nails are thick and elongated, causing pain in shoe gear when ambulating.  Patient and her family member note that the nails were last trimmed three years ago.  She had to buy shoes a whole size larger to accommodate the long toenails.  Her family is unable to trim them at this point.  She states her toes are very sore from the long, curling toenails.  She has difficulty walking at this time for that reason.  She went to the ED on 02/06/23 for her toenails and was referred here.  States she was originally scheduled with Dr. Janit but couldn't wait any longer to be seen.  [facility].  Last seen around 12/21/22.  Past Medical History:  Diagnosis Date   Anxiety    Asthma    Blind    left eye   Cancer (HCC)    lung, tx radiation and chemo   Hyperlipemia    Hypertension    Stroke (HCC)     Allergies  Allergen Reactions   Prednisone  Anaphylaxis    seizure   Bicillin C-R Hives   Ibuprofen  Nausea Only   Nsaids Other (See Comments)    Hives    Penicillins Hives    Has patient had a PCN reaction causing immediate rash, facial/tongue/throat swelling, SOB or lightheadedness with hypotension: No Has patient had a PCN reaction causing severe rash involving mucus membranes or skin necrosis: Yes Has patient had a PCN reaction that required hospitalization No Has patient had a PCN reaction occurring within the last 10 years: Yes  If all of the above answers are NO, then may proceed with Cephalosporin use.     Objective:  Andrea Mullen is a pleasant 57 y.o. female in NAD. AAO x 3.  Vascular Examination: Patient has palpable DP pulse, absent PT pulse bilateral.  Delayed capillary refill bilateral toes.  Sparse digital  hair bilateral.  Proximal to distal cooling WNL bilateral. +1 pitting edema bilateral.     Dermatological Examination: Interspaces are clear with no open lesions noted bilateral.  Skin is shiny and atrophic bilateral.  Nails are 5-87mm thick, with yellowish/brown discoloration, subungual debris and distal onycholysis x10.  There is pain with compression of nails x10. They are severely elongate (most are around one inch long and curling back onto the toe/skin)  Patient qualifies for at-risk foot care because of PVD, edema .  Assessment/Plan: 1. Pain due to onychomycosis of toenails of both feet   2. Onychogryphosis   3. PVD (peripheral vascular disease) (HCC)   4. Generalized edema    After soaking the patient's toes in 3-WEA solution for 20 minutes, the mycotic/gryphotic nails x10 were sharply debrided with sterile nail nippers and power debriding burr to decrease bulk and length.  Patient to elevate legs at home to help manage chronic edema.  Return in about 3 months (around 05/10/2023) for [facility] w/ Dr. Janit (that's who she was originally scheduled with).   Awanda CHARM Imperial, DPM, FACFAS [facility]     [address]  [place], [place] [address]                Office [phone]  Fax [phone]

## 2023-02-12 ENCOUNTER — Telehealth: Payer: Self-pay

## 2023-02-12 DIAGNOSIS — Z9181 History of falling: Secondary | ICD-10-CM

## 2023-02-12 DIAGNOSIS — R262 Difficulty in walking, not elsewhere classified: Secondary | ICD-10-CM

## 2023-02-12 NOTE — Addendum Note (Signed)
 Addended byLucia Estelle D on: 02/12/2023 02:08 PM   Modules accepted: Orders

## 2023-02-12 NOTE — Telephone Encounter (Signed)
 Patient requested a walker and an order for adult diapers.  I wrote the order for the walker to assist with gait, but the PCP or another provider needs to write the order for the Depends / Adult Diapers.  This is out of my scope of care.  Please fax walker order to appropriate facility, or mail to patient.  Thank you.

## 2023-02-12 NOTE — Telephone Encounter (Signed)
 Patient called and left a message. Apparently she has been falling more often and is asking for a walker.She also asked for a prescription for pampers' because they are expensive and she cannot afford them - Are you willing to order a walker for her? It looks like she has a neurologist, but it looks like she uses urgent care and the ED instead of a PCP.

## 2023-02-14 ENCOUNTER — Encounter (HOSPITAL_COMMUNITY): Payer: Self-pay

## 2023-02-14 ENCOUNTER — Ambulatory Visit (HOSPITAL_COMMUNITY)
Admission: EM | Admit: 2023-02-14 | Discharge: 2023-02-14 | Disposition: A | Discharge status: Home / Self Care | Payer: 59

## 2023-02-14 DIAGNOSIS — R519 Headache, unspecified: Secondary | ICD-10-CM | POA: Diagnosis not present

## 2023-02-14 DIAGNOSIS — Z760 Encounter for issue of repeat prescription: Secondary | ICD-10-CM

## 2023-02-14 MED ORDER — BUTALBITAL-APAP-CAFFEINE 50-325-40 MG PO TABS: 1.0000 | ORAL_TABLET | Freq: Four times a day (QID) | ORAL | 0 refills | Status: DC | PRN

## 2023-02-14 MED ORDER — ALBUTEROL SULFATE HFA 108 (90 BASE) MCG/ACT IN AERS: 1.0000 | INHALATION_SPRAY | Freq: Four times a day (QID) | RESPIRATORY_TRACT | 0 refills | Status: DC | PRN
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
## 2023-02-14 NOTE — ED Triage Notes (Signed)
 Headache, nosebleeds on and off for 2 weeks. Patient also having a productive cough, no fever, no runny nose. States her daughter did have a virus. Patient states she was seen about a week ago and was negative.   Patient tried motrin with mild relief.

## 2023-02-14 NOTE — Discharge Instructions (Signed)
 Go to the ED if your headache does not improve or if it gets worse despite medication use

## 2023-05-02 ENCOUNTER — Encounter: Payer: Self-pay | Admitting: Emergency Medicine

## 2023-05-10 ENCOUNTER — Ambulatory Visit (INDEPENDENT_AMBULATORY_CARE_PROVIDER_SITE_OTHER): Payer: 59 | Admitting: Podiatry

## 2023-05-10 DIAGNOSIS — Z91199 Patient's noncompliance with other medical treatment and regimen due to unspecified reason: Secondary | ICD-10-CM

## 2023-05-10 NOTE — Progress Notes (Signed)
   Complete physical exam  Patient: Andrea Mullen   DOB: 11/29/1997   58 y.o. Female  MRN: [id number]  Subjective:    No chief complaint on file.   Andrea Mullen is a 58 y.o. female who presents today for a complete physical exam. She reports consuming a {diet types:17450} diet. {types:19826} She generally feels {DESC; WELL/FAIRLY WELL/POORLY:18703}. She reports sleeping {DESC; WELL/FAIRLY WELL/POORLY:18703}. She {does/does not:200015} have additional problems to discuss today.    Most recent fall risk assessment:    08/06/2021   10:42 AM  Fall Risk   Falls in the past year? 0  Number falls in past yr: 0  Injury with Fall? 0  Risk for fall due to : No Fall Risks  Follow up Falls evaluation completed     Most recent depression screenings:    08/06/2021   10:42 AM 06/27/2020   10:46 AM  PHQ 2/9 Scores  PHQ - 2 Score 0 0  PHQ- 9 Score 5     {VISON DENTAL STD PSA (Optional):27386}  {History (Optional):23778}  Patient Care Team: Christen Butter, NP as PCP - General (Nurse Practitioner)   Outpatient Medications Prior to Visit  Medication Sig   fluticasone (FLONASE) 50 MCG/ACT nasal spray Place 2 sprays into both nostrils in the morning and at bedtime. After 7 days, reduce to once daily.   norgestimate-ethinyl estradiol (SPRINTEC 28) 0.25-35 MG-MCG tablet Take 1 tablet by mouth daily.   Nystatin POWD Apply liberally to affected area 2 times per day   spironolactone (ALDACTONE) 100 MG tablet Take 1 tablet (100 mg total) by mouth daily.   No facility-administered medications prior to visit.    ROS        Objective:     There were no vitals taken for this visit. {Vitals History (Optional):23777}  Physical Exam   No results found for any visits on 09/11/21. {Show previous labs (optional):23779}    Assessment & Plan:    Routine Health Maintenance and Physical Exam  Immunization History  Administered Date(s) Administered   DTaP 02/12/1999, 04/10/1999,  06/19/1999, 03/04/2000, 09/18/2003   Hepatitis A 07/15/2007, 07/20/2008   Hepatitis B 11/30/1998, 01/07/1999, 06/19/1999   HiB (PRP-OMP) 02/12/1999, 04/10/1999, 06/19/1999, 03/04/2000   IPV 02/12/1999, 04/10/1999, 12/08/1999, 09/18/2003   Influenza,inj,Quad PF,6+ Mos 10/20/2013   Influenza-Unspecified 01/20/2012   MMR 12/07/2000, 09/18/2003   Meningococcal Polysaccharide 07/20/2011   Pneumococcal Conjugate-13 03/04/2000   Pneumococcal-Unspecified 06/19/1999, 09/02/1999   Tdap 07/20/2011   Varicella 12/08/1999, 07/15/2007    Health Maintenance  Topic Date Due   HIV Screening  Never done   Hepatitis C Screening  Never done   INFLUENZA VACCINE  09/09/2021   PAP-Cervical Cytology Screening  09/11/2021 (Originally 11/29/2019)   PAP SMEAR-Modifier  09/11/2021 (Originally 11/29/2019)   TETANUS/TDAP  09/11/2021 (Originally 07/19/2021)   HPV VACCINES  Discontinued   COVID-19 Vaccine  Discontinued    Discussed health benefits of physical activity, and encouraged her to engage in regular exercise appropriate for her age and condition.  Problem List Items Addressed This Visit   None Visit Diagnoses     Annual physical exam    -  Primary   Cervical cancer screening       Need for Tdap vaccination          No follow-ups on file.     Christen Butter, NP

## 2023-08-09 ENCOUNTER — Ambulatory Visit: Payer: 59 | Admitting: Podiatry

## 2023-09-20 ENCOUNTER — Emergency Department (HOSPITAL_COMMUNITY)

## 2023-09-20 ENCOUNTER — Emergency Department (HOSPITAL_COMMUNITY)
Admission: EM | Admit: 2023-09-20 | Discharge: 2023-09-21 | Disposition: A | Discharge status: Home / Self Care | Attending: Emergency Medicine | Admitting: Emergency Medicine

## 2023-09-20 ENCOUNTER — Other Ambulatory Visit: Payer: Self-pay

## 2023-09-20 ENCOUNTER — Encounter (HOSPITAL_COMMUNITY): Payer: Self-pay

## 2023-09-20 DIAGNOSIS — Z79899 Other long term (current) drug therapy: Secondary | ICD-10-CM | POA: Diagnosis not present

## 2023-09-20 DIAGNOSIS — J45909 Unspecified asthma, uncomplicated: Secondary | ICD-10-CM | POA: Diagnosis not present

## 2023-09-20 DIAGNOSIS — I10 Essential (primary) hypertension: Secondary | ICD-10-CM | POA: Diagnosis not present

## 2023-09-20 DIAGNOSIS — S0990XA Unspecified injury of head, initial encounter: Secondary | ICD-10-CM | POA: Insufficient documentation

## 2023-09-20 DIAGNOSIS — I4510 Unspecified right bundle-branch block: Secondary | ICD-10-CM | POA: Insufficient documentation

## 2023-09-20 DIAGNOSIS — R5383 Other fatigue: Secondary | ICD-10-CM | POA: Diagnosis not present

## 2023-09-20 DIAGNOSIS — M25522 Pain in left elbow: Secondary | ICD-10-CM | POA: Insufficient documentation

## 2023-09-20 DIAGNOSIS — R531 Weakness: Secondary | ICD-10-CM | POA: Insufficient documentation

## 2023-09-20 DIAGNOSIS — W19XXXA Unspecified fall, initial encounter: Secondary | ICD-10-CM | POA: Insufficient documentation

## 2023-09-20 DIAGNOSIS — Z859 Personal history of malignant neoplasm, unspecified: Secondary | ICD-10-CM | POA: Insufficient documentation

## 2023-09-20 DIAGNOSIS — R519 Headache, unspecified: Secondary | ICD-10-CM | POA: Diagnosis present

## 2023-09-20 DIAGNOSIS — Z8673 Personal history of transient ischemic attack (TIA), and cerebral infarction without residual deficits: Secondary | ICD-10-CM | POA: Insufficient documentation

## 2023-09-20 LAB — COMPREHENSIVE METABOLIC PANEL WITH GFR
ALT: 16 U/L (ref 0–44)
AST: 19 U/L (ref 15–41)
Albumin: 3.1 g/dL — ABNORMAL LOW (ref 3.5–5.0)
Alkaline Phosphatase: 67 U/L (ref 38–126)
Anion gap: 10 (ref 5–15)
BUN: 11 mg/dL (ref 6–20)
CO2: 25 mmol/L (ref 22–32)
Calcium: 9.1 mg/dL (ref 8.9–10.3)
Chloride: 104 mmol/L (ref 98–111)
Creatinine, Ser: 0.73 mg/dL (ref 0.44–1.00)
GFR, Estimated: >60 mL/min (ref >60)
Glucose, Bld: 105 mg/dL — ABNORMAL HIGH (ref 70–99)
Potassium: 4.4 mmol/L (ref 3.5–5.1)
Sodium: 139 mmol/L (ref 135–145)
Total Bilirubin: 0.7 mg/dL (ref 0.0–1.2)
Total Protein: 7 g/dL (ref 6.5–8.1)

## 2023-09-20 LAB — TSH: TSH: 1.455 u[IU]/mL (ref 0.350–4.500)

## 2023-09-20 LAB — CBC
HCT: 37.7 % (ref 36.0–46.0)
Hemoglobin: 11.8 g/dL — ABNORMAL LOW (ref 12.0–15.0)
MCH: 27.8 pg (ref 26.0–34.0)
MCHC: 31.3 g/dL (ref 30.0–36.0)
MCV: 88.7 fL (ref 80.0–100.0)
Platelets: 219 K/uL (ref 150–400)
RBC: 4.25 MIL/uL (ref 3.87–5.11)
RDW: 13 % (ref 11.5–15.5)
WBC: 5 K/uL (ref 4.0–10.5)
nRBC: 0 % (ref 0.0–0.2)

## 2023-09-20 LAB — MAGNESIUM: Magnesium: 2 mg/dL (ref 1.7–2.4)

## 2023-09-20 MED ORDER — ACETAMINOPHEN 500 MG PO TABS: 1000.0000 mg | ORAL_TABLET | ORAL | Status: DC

## 2023-09-20 NOTE — ED Provider Notes (Signed)
 [place] EMERGENCY DEPARTMENT AT [facility] Provider Note   CSN: [id number] Arrival date & time: 09/20/23  2107     Patient presents with: Andrea Mullen   SHAKEEMA LIPPMAN is a 58 y.o. female.    Fall   Patient is a 58 year old female with a past medical history significant for asthma, cancer, anxiety, HLD, HTN, stroke 2 years ago  Patient with 3 days of worsening dysequilibrium. She denies any new weakness but states that she has felt more generally offkilter than usual.  She has some trouble describing exactly what the issue is.  It does not seem that she has 1 particular extremity weakness.  At baseline patient has L eye blindness, L arm weakness and L leg weakness.  She does indicate that she has had some increase in the numbness sensation that she has in her left leg.  She has daily falls and has had daily falls for at least the past year.  Seems that since she had a stroke 2 years ago she has had more issues with walking but does use a cane most of the time   Denies any pain apart from left elbow pain and a mild headache after hitting her head earlier today.  She is not on any blood thinners.     Prior to Admission medications   Medication Sig Start Date End Date Taking? Authorizing Provider  albuterol  (VENTOLIN  HFA) 108 (90 Base) MCG/ACT inhaler Inhale 1-2 puffs into the lungs every 6 (six) hours as needed for wheezing or shortness of breath. 02/14/23   Juleen Rush, PA-C  butalbital -acetaminophen -caffeine  (FIORICET) 50-325-40 MG tablet Take 1-2 tablets by mouth every 6 (six) hours as needed for headache. 02/14/23 02/14/24  Juleen Rush, PA-C  rosuvastatin  (CRESTOR ) 40 MG tablet Take 1 tablet (40 mg total) by mouth daily. 01/07/22   Byrum, Robert S, MD  Vitamin D, Ergocalciferol, (DRISDOL) 1.25 MG (50000 UNIT) CAPS capsule Take 50,000 Units by mouth once a week. 02/09/23   [provider]    Allergies: Prednisone , Bicillin c-r, Ibuprofen , Nsaids, and  Penicillins    Review of Systems  Updated Vital Signs BP 129/66   Pulse 97   Temp 98.2 F (36.8 C) (Oral)   Resp 14   Ht 5' 4 (1.626 m)   Wt 90.7 kg   LMP 11/16/2016   SpO2 100%   BMI 34.33 kg/m   Physical Exam Vitals and nursing note reviewed.  Constitutional:      General: She is not in acute distress. HENT:     Head: Normocephalic and atraumatic.     Nose: Nose normal.  Eyes:     General: No scleral icterus. Cardiovascular:     Rate and Rhythm: Normal rate and regular rhythm.     Pulses: Normal pulses.     Heart sounds: Normal heart sounds.  Pulmonary:     Effort: Pulmonary effort is normal. No respiratory distress.     Breath sounds: No wheezing.  Abdominal:     Palpations: Abdomen is soft.     Tenderness: There is no abdominal tenderness.  Musculoskeletal:     Cervical back: Normal range of motion.     Right lower leg: No edema.     Left lower leg: No edema.  Skin:    General: Skin is warm and dry.     Capillary Refill: Capillary refill takes less than 2 seconds.  Neurological:     Mental Status: She is alert. Mental status is at baseline.  Comments: L sided weakness - baseline for patient   Answers questions appropriately follows commands  Smile symmetric, PERRLA, EOMI, sensation normal in upper and lower extremities and symmetric  Psychiatric:        Mood and Affect: Mood normal.        Behavior: Behavior normal.     (all labs ordered are listed, but only abnormal results are displayed) Labs Reviewed  CBC - Abnormal; Notable for the following components:      Result Value   Hemoglobin 11.8 (*)    All other components within normal limits  COMPREHENSIVE METABOLIC PANEL WITH GFR - Abnormal; Notable for the following components:   Glucose, Bld 105 (*)    Albumin 3.1 (*)    All other components within normal limits  MAGNESIUM  TSH  URINALYSIS, ROUTINE W REFLEX MICROSCOPIC    EKG: EKG Interpretation Date/Time:  Monday September 20 2023  21:15:02 EDT Ventricular Rate:  97 PR Interval:  130 QRS Duration:  122 QT Interval:  399 QTC Calculation: 507 R Axis:   64  Text Interpretation: Sinus rhythm Right bundle branch block Inferior infarct, age indeterminate QRS longer than prior.  RBBB new Confirmed by Cleotilde Rogue ([id number]) on 09/20/2023 9:25:51 PM  Radiology: [facility] Elbow Complete Left Result Date: 09/20/2023 CLINICAL DATA:  Status post fall with elbow pain. EXAM: LEFT ELBOW - COMPLETE 3+ VIEW COMPARISON:  None Available. FINDINGS: There is no evidence of fracture, dislocation, or joint effusion. Joint spaces are preserved. Minor degenerative spurring. Soft tissues are unremarkable. IMPRESSION: No fracture or dislocation of the left elbow. Electronically Signed   By: Andrea Gasman M.D.   On: 09/20/2023 23:07     Procedures   Medications Ordered in the ED  acetaminophen  (TYLENOL ) tablet 1,000 mg (1,000 mg Oral Not Given 09/20/23 2325)    Clinical Course as of 09/21/23 0006  Mon Sep 20, 2023  2126 New RBBB [WF]    Clinical Course User Index [WF] Neldon Hamp RAMAN, [place]                                 Medical Decision Making Amount and/or Complexity of Data Reviewed Labs: ordered. Radiology: ordered.  Risk OTC drugs.   This patient presents to the ED for concern of fall, this involves a number of treatment options, and is a complaint that carries with it a moderate risk of complications and morbidity. A differential diagnosis was considered for the patient's symptoms which is discussed below:   Elbow pain, head pain, fatigue.   The differential diagnosis of weakness includes but is not limited to neurologic causes (GBS, myasthenia gravis, CVA, MS, ALS, transverse myelitis, spinal cord injury, CVA, botulism, ) and other causes: ACS, Arrhythmia, syncope, orthostatic hypotension, sepsis, hypoglycemia, electrolyte disturbance, hypothyroidism, respiratory failure, symptomatic anemia, dehydration, heat injury,  polypharmacy, malignancy.  Will obtain x-ray imaging of elbow to ensure no fracture, CT of head to ensure no intracranial hemorrhage.  Co morbidities: Discussed in HPI   Brief History:  Patient is a 58 year old female with a past medical history significant for asthma, cancer, anxiety, HLD, HTN, stroke 2 years ago  Patient with 3 days of worsening dysequilibrium. She denies any new weakness but states that she has felt more generally offkilter than usual.  She has some trouble describing exactly what the issue is.  It does not seem that she has 1 particular extremity weakness.  At baseline patient has  L eye blindness, L arm weakness and L leg weakness.   She has daily falls and has had daily falls for at least the past year.  Seems that since she had a stroke 2 years ago she has had more issues with walking but does use a cane most of the time   Denies any pain apart from left elbow pain and a mild headache after hitting her head earlier today.  She is not on any blood thinners.    EMR reviewed including pt PMHx, past surgical history and past visits to ER.   See HPI for more details   Lab Tests:  I personally reviewed all laboratory work and imaging. Metabolic panel without any acute abnormality specifically kidney function within normal limits and no significant electrolyte abnormalities. CBC without leukocytosis or significant anemia.  Urine pending  Imaging Studies:  NAD. I personally reviewed all imaging studies and no acute abnormality found. I agree with radiology interpretation.  - xray elbow neg - CT head pending  Cardiac Monitoring:  The patient was maintained on a cardiac monitor.  I personally viewed and interpreted the cardiac monitored which showed an underlying rhythm of: NSR EKG non-ischemic new RBBB    Medicines ordered:  I ordered medication including tylenol  for pain Reevaluation of the patient after these medicines showed that the patient stayed  the same I have reviewed the patients home medicines and have made adjustments as needed   Critical Interventions:     Consults/Attending Physician   I discussed this case with my attending physician who cosigned this note including patient's presenting symptoms, physical exam, and planned diagnostics and interventions. Attending physician stated agreement with plan or made changes to plan which were implemented.   Reevaluation:  After the interventions noted above I re-evaluated patient and found that they have :stayed the same   Social Determinants of Health:  The patient's social determinants of health were a factor in the care of this patient  No PCP  Problem List / ED Course:  Elbow pain - negative for fx Head injury - CT head pending Fatigue - reassuring workup UA pending   Dispostion:  After consideration of the diagnostic results and the patients response to treatment, I feel that the patent would benefit from DC home if reassuring workup completed.     Final diagnoses:  Fall, initial encounter  Right bundle branch block    ED Discharge Orders     None          Neldon Inoue [place], [place] 09/21/23 0007    Cleotilde Rogue, MD 09/21/23 1300

## 2023-09-20 NOTE — ED Triage Notes (Signed)
 Pt comming in after a fall today. Pain to left elbow. Pt has been falling more recently . Pt has history of L sided weakness from stroke . Pt reports increased Numbness in left leg.   Ems  124/82 Pulse 80 98% ra  Cbg 134

## 2023-09-21 LAB — URINALYSIS, ROUTINE W REFLEX MICROSCOPIC
Bilirubin Urine: NEGATIVE
Glucose, UA: NEGATIVE mg/dL
Hgb urine dipstick: NEGATIVE
Ketones, ur: NEGATIVE mg/dL
Leukocytes,Ua: NEGATIVE
Nitrite: NEGATIVE
Protein, ur: NEGATIVE mg/dL
Specific Gravity, Urine: 1.018 (ref 1.005–1.030)
pH: 5 (ref 5.0–8.0)

## 2023-09-21 MED ORDER — SODIUM CHLORIDE 0.9 % IV BOLUS
1000.0000 mL | Freq: Once | INTRAVENOUS | Status: AC
  Administered 2023-09-21 (×2): 1000 mL via INTRAVENOUS

## 2023-09-21 NOTE — ED Provider Notes (Signed)
 Assumed care from [facility] and Dr. Cleotilde at shift change.  See prior notes for full H&P.  Briefly, 58 y.o. F here after a fall.  Admits to feeling somewhat off but vague about this.  Work-up thus far reassuring.    Plan:  awaiting CT head and UA.  Recommended for d/c if reassuring.  Results for orders placed or performed during the hospital encounter of 09/20/23  CBC   Collection Time: 09/20/23 10:49 PM  Result Value Ref Range   WBC 5.0 4.0 - 10.5 K/uL   RBC 4.25 3.87 - 5.11 MIL/uL   Hemoglobin 11.8 (L) 12.0 - 15.0 g/dL   HCT 62.2 63.9 - 53.9 %   MCV 88.7 80.0 - 100.0 fL   MCH 27.8 26.0 - 34.0 pg   MCHC 31.3 30.0 - 36.0 g/dL   RDW 86.9 88.4 - 84.4 %   Platelets 219 150 - 400 K/uL   nRBC 0.0 0.0 - 0.2 %  Comprehensive metabolic panel   Collection Time: 09/20/23 10:49 PM  Result Value Ref Range   Sodium 139 135 - 145 mmol/L   Potassium 4.4 3.5 - 5.1 mmol/L   Chloride 104 98 - 111 mmol/L   CO2 25 22 - 32 mmol/L   Glucose, Bld 105 (H) 70 - 99 mg/dL   BUN 11 6 - 20 mg/dL   Creatinine, Ser 9.26 0.44 - 1.00 mg/dL   Calcium  9.1 8.9 - 10.3 mg/dL   Total Protein 7.0 6.5 - 8.1 g/dL   Albumin 3.1 (L) 3.5 - 5.0 g/dL   AST 19 15 - 41 U/L   ALT 16 0 - 44 U/L   Alkaline Phosphatase 67 38 - 126 U/L   Total Bilirubin 0.7 0.0 - 1.2 mg/dL   GFR, Estimated >39 >39 mL/min   Anion gap 10 5 - 15  Magnesium   Collection Time: 09/20/23 10:49 PM  Result Value Ref Range   Magnesium 2.0 1.7 - 2.4 mg/dL  TSH   Collection Time: 09/20/23 10:49 PM  Result Value Ref Range   TSH 1.455 0.350 - 4.500 uIU/mL  Urinalysis, Routine w reflex microscopic -Urine, Clean Catch   Collection Time: 09/21/23  1:59 AM  Result Value Ref Range   Color, Urine YELLOW YELLOW   APPearance HAZY (A) CLEAR   Specific Gravity, Urine 1.018 1.005 - 1.030   pH 5.0 5.0 - 8.0   Glucose, UA NEGATIVE NEGATIVE mg/dL   Hgb urine dipstick NEGATIVE NEGATIVE   Bilirubin Urine NEGATIVE NEGATIVE   Ketones, ur NEGATIVE NEGATIVE  mg/dL   Protein, ur NEGATIVE NEGATIVE mg/dL   Nitrite NEGATIVE NEGATIVE   Leukocytes,Ua NEGATIVE NEGATIVE   RBC / HPF 0-5 0 - 5 RBC/hpf   WBC, UA 0-5 0 - 5 WBC/hpf   Bacteria, UA RARE (A) NONE SEEN   Squamous Epithelial / HPF 0-5 0 - 5 /HPF   CT Head Wo Contrast Result Date: 09/21/2023 CLINICAL DATA:  Worsening left-sided weakness. EXAM: CT HEAD WITHOUT CONTRAST TECHNIQUE: Contiguous axial images were obtained from the base of the skull through the vertex without intravenous contrast. RADIATION DOSE REDUCTION: This exam was performed according to the departmental dose-optimization program which includes automated exposure control, adjustment of the mA and/or kV according to patient size and/or use of iterative reconstruction technique. COMPARISON:  November 08, 2021 FINDINGS: Brain: There is generalized cerebral atrophy with widening of the extra-axial spaces and ventricular dilatation. There are areas of decreased attenuation within the white matter tracts of the supratentorial  brain, consistent with microvascular disease changes. Chronic bilateral basal ganglia lacunar infarcts are seen. Vascular: Marked severity bilateral cavernous carotid artery calcification is noted. Skull: Normal. Negative for fracture or focal lesion. Sinuses/Orbits: No acute finding. Other: None. IMPRESSION: 1. Generalized cerebral atrophy and microvascular disease changes of the supratentorial brain. 2. Chronic bilateral basal ganglia lacunar infarcts. 3. No acute intracranial abnormality. Electronically Signed   By: Suzen Dials M.D.   On: 09/21/2023 00:09   DG Elbow Complete Left Result Date: 09/20/2023 CLINICAL DATA:  Status post fall with elbow pain. EXAM: LEFT ELBOW - COMPLETE 3+ VIEW COMPARISON:  None Available. FINDINGS: There is no evidence of fracture, dislocation, or joint effusion. Joint spaces are preserved. Minor degenerative spurring. Soft tissues are unremarkable. IMPRESSION: No fracture or dislocation of  the left elbow. Electronically Signed   By: Andrea Gasman M.D.   On: 09/20/2023 23:07    UA without signs of infection.  CT head negative.  VSS.  Appropriate for discharge.  Close follow-up with PCP encouraged.  Return here for new concerns.   Jarold Olam HERO, PA-C 09/21/23 0431    Carita Senior, MD 09/21/23 [phone]

## 2023-09-21 NOTE — Discharge Instructions (Signed)
 Work-up today was reassuring-- no traumatic injuries, no signs of infection or lab abnormalities. Recommend to follow-up closely with your primary care doctor. Return here for new concerns.

## 2023-09-21 NOTE — ED Notes (Signed)
 Patient made aware of needing urine sample. Patient states she does not need to go at this time, but will call out when ready.

## 2023-10-14 ENCOUNTER — Other Ambulatory Visit: Payer: Self-pay

## 2023-10-14 ENCOUNTER — Emergency Department (HOSPITAL_COMMUNITY)

## 2023-10-14 ENCOUNTER — Encounter (HOSPITAL_COMMUNITY): Payer: Self-pay

## 2023-10-14 ENCOUNTER — Emergency Department (HOSPITAL_COMMUNITY): Admission: EM | Admit: 2023-10-14 | Discharge: 2023-10-14 | Disposition: A | Discharge status: Home / Self Care

## 2023-10-14 DIAGNOSIS — C3490 Malignant neoplasm of unspecified part of unspecified bronchus or lung: Secondary | ICD-10-CM | POA: Diagnosis not present

## 2023-10-14 DIAGNOSIS — I10 Essential (primary) hypertension: Secondary | ICD-10-CM | POA: Diagnosis not present

## 2023-10-14 DIAGNOSIS — R059 Cough, unspecified: Secondary | ICD-10-CM | POA: Insufficient documentation

## 2023-10-14 DIAGNOSIS — J45901 Unspecified asthma with (acute) exacerbation: Secondary | ICD-10-CM | POA: Diagnosis not present

## 2023-10-14 DIAGNOSIS — R0602 Shortness of breath: Secondary | ICD-10-CM | POA: Diagnosis present

## 2023-10-14 LAB — RESP PANEL BY RT-PCR (RSV, FLU A&B, COVID)  RVPGX2
Influenza A by PCR: NEGATIVE
Influenza B by PCR: NEGATIVE
Resp Syncytial Virus by PCR: NEGATIVE
SARS Coronavirus 2 by RT PCR: NEGATIVE

## 2023-10-14 LAB — BASIC METABOLIC PANEL WITH GFR
Anion gap: 11 (ref 5–15)
BUN: 11 mg/dL (ref 6–20)
CO2: 25 mmol/L (ref 22–32)
Calcium: 8.9 mg/dL (ref 8.9–10.3)
Chloride: 102 mmol/L (ref 98–111)
Creatinine, Ser: 0.81 mg/dL (ref 0.44–1.00)
GFR, Estimated: >60 mL/min (ref >60)
Glucose, Bld: 107 mg/dL — ABNORMAL HIGH (ref 70–99)
Potassium: 3.8 mmol/L (ref 3.5–5.1)
Sodium: 138 mmol/L (ref 135–145)

## 2023-10-14 LAB — CBC WITH DIFFERENTIAL/PLATELET
Abs Immature Granulocytes: 0.03 K/uL (ref 0.00–0.07)
Basophils Absolute: 0 K/uL (ref 0.0–0.1)
Basophils Relative: 0 %
Eosinophils Absolute: 0.1 K/uL (ref 0.0–0.5)
Eosinophils Relative: 1 %
HCT: 37.6 % (ref 36.0–46.0)
Hemoglobin: 11.7 g/dL — ABNORMAL LOW (ref 12.0–15.0)
Immature Granulocytes: 0 %
Lymphocytes Relative: 16 %
Lymphs Abs: 1.2 K/uL (ref 0.7–4.0)
MCH: 27.6 pg (ref 26.0–34.0)
MCHC: 31.1 g/dL (ref 30.0–36.0)
MCV: 88.7 fL (ref 80.0–100.0)
Monocytes Absolute: 0.4 K/uL (ref 0.1–1.0)
Monocytes Relative: 5 %
Neutro Abs: 5.7 K/uL (ref 1.7–7.7)
Neutrophils Relative %: 78 %
Platelets: 167 K/uL (ref 150–400)
RBC: 4.24 MIL/uL (ref 3.87–5.11)
RDW: 13.7 % (ref 11.5–15.5)
WBC: 7.4 K/uL (ref 4.0–10.5)
nRBC: 0 % (ref 0.0–0.2)

## 2023-10-14 LAB — D-DIMER, QUANTITATIVE: D-Dimer, Quant: 7.96 ug{FEU}/mL — ABNORMAL HIGH (ref 0.00–0.50)

## 2023-10-14 LAB — BRAIN NATRIURETIC PEPTIDE: B Natriuretic Peptide: 103.2 pg/mL — ABNORMAL HIGH (ref 0.0–100.0)

## 2023-10-14 MED ORDER — IOHEXOL 350 MG/ML SOLN
75.0000 mL | Freq: Once | INTRAVENOUS | Status: AC | PRN
  Administered 2023-10-14: 75 mL via INTRAVENOUS

## 2023-10-14 MED ORDER — IPRATROPIUM-ALBUTEROL 0.5-2.5 (3) MG/3ML IN SOLN: 3.0000 mL | Freq: Once | RESPIRATORY_TRACT | Status: DC

## 2023-10-14 MED ORDER — ALBUTEROL SULFATE (2.5 MG/3ML) 0.083% IN NEBU: 2.5000 mg | INHALATION_SOLUTION | Freq: Four times a day (QID) | RESPIRATORY_TRACT | 12 refills | Status: AC | PRN

## 2023-10-14 MED ORDER — ALBUTEROL SULFATE HFA 108 (90 BASE) MCG/ACT IN AERS: 2.0000 | INHALATION_SPRAY | RESPIRATORY_TRACT | 0 refills | Status: AC | PRN

## 2023-10-14 MED ORDER — LACTATED RINGERS IV BOLUS
1000.0000 mL | Freq: Once | INTRAVENOUS | Status: AC
  Administered 2023-10-14: 1000 mL via INTRAVENOUS

## 2023-10-14 NOTE — ED Triage Notes (Signed)
 Pt arrives to ED via EMS from home with c/o increased SOB starting around 0400, pt has history of lung cancer in 2008. Denies fever cough or any other symptoms.

## 2023-10-14 NOTE — Discharge Instructions (Addendum)
 Your workup here shows that you have a new lesion on your lung that looks like a lung cancer.  I have made a referral to the oncologist your cancer doctors.  Please use your albuterol  as needed and follow-up as instructed when you receive a callback.  If you do not hear anything by tomorrow afternoon please call the office at the number provided.  Return to the ER for worsening symptoms.

## 2023-10-14 NOTE — ED Notes (Signed)
 Called for pt pick ptar

## 2023-10-14 NOTE — ED Notes (Signed)
 CCM called for tele admit.

## 2023-10-14 NOTE — ED Provider Notes (Signed)
 [place] EMERGENCY DEPARTMENT AT [facility] Provider Note   CSN: [id number] Arrival date & time: 10/14/23  9267     Patient presents with: Shortness of Breath   Andrea Mullen is a 58 y.o. female.   58 year old female with past medical history of lung cancer, asthma, and hypertension presenting to the emergency department today with shortness of breath.  States he has been feeling unwell now for the past 2 days.  She is coughing up a lot of phlegm.  She denies any hemoptysis.  Denies any associated chest pain.  She states that she was much more short of breath this morning when she woke up.  She denies a history of DVT or pulmonary embolism, recent surgeries, recent travel.  She tells me that she is cancer free but does have remote history of lung cancer.  She is feeling much better with nebulizer treatments from medics.  She does report that she does not tolerate steroids.        Prior to Admission medications   Medication Sig Start Date End Date Taking? Authorizing Provider  albuterol  (PROVENTIL ) (2.5 MG/3ML) 0.083% nebulizer solution Take 3 mLs (2.5 mg total) by nebulization every 6 (six) hours as needed for wheezing or shortness of breath. 10/14/23  Yes Ula Prentice SAUNDERS, MD  albuterol  (VENTOLIN  HFA) 108 [phone] Base) MCG/ACT inhaler Inhale 2 puffs into the lungs every 4 (four) hours as needed for wheezing or shortness of breath. 10/14/23   Ula Prentice SAUNDERS, MD  butalbital -acetaminophen -caffeine  (FIORICET) 50-325-40 MG tablet Take 1-2 tablets by mouth every 6 (six) hours as needed for headache. 02/14/23 02/14/24  Juleen Rush, PA-C  rosuvastatin  (CRESTOR ) 40 MG tablet Take 1 tablet (40 mg total) by mouth daily. 01/07/22   Byrum, Robert S, MD  Vitamin D, Ergocalciferol, (DRISDOL) 1.25 MG (50000 UNIT) CAPS capsule Take 50,000 Units by mouth once a week. 02/09/23   [provider]    Allergies: Prednisone , Bicillin c-r, Ibuprofen , Nsaids, and Penicillins    Review of Systems   Respiratory:  Positive for cough and shortness of breath.   All other systems reviewed and are negative.   Updated Vital Signs BP 121/87   Pulse (!) 111   Temp 98.2 F (36.8 C) (Oral)   Resp 17   LMP 11/16/2016   SpO2 100%   Physical Exam Vitals and nursing note reviewed.   Gen: NAD Eyes: PERRL, EOMI HEENT: no oropharyngeal swelling Neck: trachea midline Resp: Diminished at bilateral lung bases with faint wheezes upper lung fields with forced expiration Card: RRR, no murmurs, rubs, or gallops Abd: nontender, nondistended Extremities: no calf tenderness, no edema Vascular: 2+ radial pulses bilaterally, 2+ DP pulses bilaterally Skin: no rashes Psyc: acting appropriately   (all labs ordered are listed, but only abnormal results are displayed) Labs Reviewed  CBC WITH DIFFERENTIAL/PLATELET - Abnormal; Notable for the following components:      Result Value   Hemoglobin 11.7 (*)    All other components within normal limits  BRAIN NATRIURETIC PEPTIDE - Abnormal; Notable for the following components:   B Natriuretic Peptide 103.2 (*)    All other components within normal limits  D-DIMER, QUANTITATIVE - Abnormal; Notable for the following components:   D-Dimer, Quant 7.96 (*)    All other components within normal limits  BASIC METABOLIC PANEL WITH GFR - Abnormal; Notable for the following components:   Glucose, Bld 107 (*)    All other components within normal limits  RESP PANEL BY RT-PCR (  RSV, FLU A&B, COVID)  [id number]    EKG: EKG Interpretation Date/Time:  Thursday October 14 2023 07:49:10 EDT Ventricular Rate:  117 PR Interval:  136 QRS Duration:  120 QT Interval:  364 QTC Calculation: 508 R Axis:   64  Text Interpretation: Sinus tachycardia Probable left atrial enlargement Right bundle branch block Inferior infarct, age indeterminate Confirmed by Ula Barter [phone]) on 10/14/2023 7:54:33 AM  Radiology: CT Angio Chest PE W and/or Wo Contrast Result Date:  10/14/2023 CLINICAL DATA:  Increased shortness of breath, history of lung cancer in 2008 normal pulmonary embolism suspected, positive D-dimer EXAM: CT ANGIOGRAPHY CHEST WITH CONTRAST TECHNIQUE: Multidetector CT imaging of the chest was performed using the standard protocol during bolus administration of intravenous contrast. Multiplanar CT image reconstructions and MIPs were obtained to evaluate the vascular anatomy. RADIATION DOSE REDUCTION: This exam was performed according to the departmental dose-optimization program which includes automated exposure control, adjustment of the mA and/or kV according to patient size and/or use of iterative reconstruction technique. CONTRAST:  75mL OMNIPAQUE  IOHEXOL  350 MG/ML SOLN COMPARISON:  Same day chest radiograph, November 09, 2021 CT FINDINGS: Cardiovascular: Satisfactory opacification of the pulmonary arteries to the segmental level. No evidence of pulmonary embolism. No pericardial effusion. Nonspecific narrowing of left brachiocephalic vein and upper SVC due to mediastinal lymphadenopathy with venous reflux into the left chest wall collaterals. Mediastinum/Nodes: Bulky right paratracheal lymphadenopathy, measuring up to 3.1 x 2.5 cm there are also some mildly prominent, subcentimeter bilateral supraclavicular lymph nodes. Lungs/Pleura: Thick-walled, cavitary right upper lobe mass measuring approximately 5.7 x 5.3 cm. There was a small pulmonary nodule at this location on prior CT performed November 09, 2021. No pleural effusions. No pneumothorax. Upper Abdomen: No acute findings. Musculoskeletal: No acute osseous findings. Review of the MIP images confirms the above findings. IMPRESSION: 1. Cavitary right upper lobe mass and bulky mediastinal lymphadenopathy, concerning for primary lung malignancy. Tissue biopsy may be helpful to confirm diagnosis and exclude possible mycobacterial infection. 2. Nonspecific narrowing of upper SVC and brachiocephalic veins due to mediastinal  lymphadenopathy. 3. Nonspecific, mildly prominent subcentimeter bilateral supraclavicular lymph nodes, felt to be reactive related to the central venous stenosis, but difficult to exclude metastatic disease. Electronically Signed   By: Michaeline Blanch M.D.   On: 10/14/2023 12:49   DG Chest 2 View Result Date: 10/14/2023 CLINICAL DATA:  SOB EXAM: CHEST - 2 VIEW COMPARISON:  January 29, 2022 FINDINGS: Thick-walled cavity in the right upper lobe, measuring 5.5 cm. No lobar consolidation, pleural effusion, or pneumothorax. Fullness of the right suprahilar region. No cardiomegaly. Aortic atherosclerosis. No acute fracture or destructive lesions. Multilevel thoracic osteophytosis. IMPRESSION: 1. Thick-walled cavity in the right upper lobe, measuring 5.5 cm. This may represent either TB or neoplasm. Appropriate isolation precautions. 2. Fullness of the right suprahilar region is also present, worrisome for lymphadenopathy. A follow-up chest CT with IV contrast recommended for further characterization. Critical Value/emergent results were called by telephone at the time of interpretation on 10/14/2023 at 8:36 am to provider Ashyra Cantin , who verbally acknowledged these results. Electronically Signed   By: Rogelia Myers M.D.   On: 10/14/2023 08:37     Procedures   Medications Ordered in the ED  ipratropium-albuterol  (DUONEB) 0.5-2.5 (3) MG/3ML nebulizer solution 3 mL (3 mLs Nebulization Not Given 10/14/23 1128)  ipratropium-albuterol  (DUONEB) 0.5-2.5 (3) MG/3ML nebulizer solution 3 mL (3 mLs Nebulization Not Given 10/14/23 1128)  iohexol  (OMNIPAQUE ) 350 MG/ML injection 75 mL (75 mLs Intravenous Contrast Given 10/14/23  1228)  lactated ringers  bolus 1,000 mL (1,000 mLs Intravenous New Bag/Given 10/14/23 1305)                                    Medical Decision Making 58 year old female with past medical history of asthma and remote history of lung cancer does not tolerate prednisone  presenting to the emergency  department today with cough and shortness of breath.  I will further evaluate the patient here with basic labs as well as an EKG, BNP, and chest x-ray for further evaluation for pulmonary edema, pulmonary infiltrates, pneumothorax.  She is denying any chest pain.  Does report significant improvement with albuterol  treatment.  She did receive magnesium with medics initially and they do report that she has gone from breathing 40-50 times per minute to breathing much better with the DuoNeb.  Suspect this is likely due to bronchitis or asthma exacerbation.  Will obtain an RSV/COVID/flu swab to evaluate for viral etiologies.  I will reevaluate for ultimate disposition.  Will give her 2 additional DuoNebs here.  The patient's respiratory status improved significantly with the nebulizer treatments.  X-ray shows a cavitary lesion.  D-dimer is elevated.  CT angiogram shows that this is more consistent with malignancy.  I did discuss this with the patient and her family.  She is mildly tachycardic but her heart rate will go down to the high 90s when she is resting.  Blood pressures are stable.  She is satting 100% on room air.  Think she is stable for discharge with outpatient follow-up.  An oncology referral was made.  Amount and/or Complexity of Data Reviewed Labs: ordered. Radiology: ordered.  Risk Prescription drug management.        Final diagnoses:  Exacerbation of asthma, unspecified asthma severity, unspecified whether persistent  Malignant neoplasm of lung, unspecified laterality, unspecified part of lung [facility])    ED Discharge Orders          Ordered    Ambulatory referral to Hematology / Oncology       Comments: Your emergency department provider has referred you to see a hematology/oncology specialist. These are physicians who specialize in blood disorders and cancers, or findings concerning for cancer. You will receive a phone call from the [facility] Office to set up your appointment  within 2 business days: [employer] operate Mon - Fri, 8:00 a.m. to 5:00 p.m.; closed for federally recognized holidays. Please be sure your phone is not set to block numbers during this time.   10/14/23 1438    albuterol  (VENTOLIN  HFA) 108 (90 Base) MCG/ACT inhaler  Every 4 hours PRN        10/14/23 1439    albuterol  (PROVENTIL ) (2.5 MG/3ML) 0.083% nebulizer solution  Every 6 hours PRN        10/14/23 1439               Ula Prentice SAUNDERS, MD 10/14/23 1501

## 2023-10-18 ENCOUNTER — Emergency Department (HOSPITAL_BASED_OUTPATIENT_CLINIC_OR_DEPARTMENT_OTHER): Admitting: Radiology

## 2023-10-18 ENCOUNTER — Emergency Department (HOSPITAL_BASED_OUTPATIENT_CLINIC_OR_DEPARTMENT_OTHER)

## 2023-10-18 ENCOUNTER — Inpatient Hospital Stay (HOSPITAL_BASED_OUTPATIENT_CLINIC_OR_DEPARTMENT_OTHER)
Admission: EM | Admit: 2023-10-18 | Discharge: 2023-10-27 | DRG: 091 | Disposition: A | Discharge status: Home / Self Care | Attending: Internal Medicine | Admitting: Internal Medicine

## 2023-10-18 DIAGNOSIS — D6869 Other thrombophilia: Secondary | ICD-10-CM | POA: Diagnosis present

## 2023-10-18 DIAGNOSIS — J984 Other disorders of lung: Secondary | ICD-10-CM

## 2023-10-18 DIAGNOSIS — I82B11 Acute embolism and thrombosis of right subclavian vein: Secondary | ICD-10-CM

## 2023-10-18 DIAGNOSIS — Z881 Allergy status to other antibiotic agents status: Secondary | ICD-10-CM

## 2023-10-18 DIAGNOSIS — C342 Malignant neoplasm of middle lobe, bronchus or lung: Secondary | ICD-10-CM | POA: Diagnosis present

## 2023-10-18 DIAGNOSIS — I6381 Other cerebral infarction due to occlusion or stenosis of small artery: Secondary | ICD-10-CM | POA: Diagnosis not present

## 2023-10-18 DIAGNOSIS — Z8049 Family history of malignant neoplasm of other genital organs: Secondary | ICD-10-CM

## 2023-10-18 DIAGNOSIS — E66812 Obesity, class 2: Secondary | ICD-10-CM | POA: Diagnosis present

## 2023-10-18 DIAGNOSIS — I110 Hypertensive heart disease with heart failure: Secondary | ICD-10-CM | POA: Diagnosis present

## 2023-10-18 DIAGNOSIS — H9190 Unspecified hearing loss, unspecified ear: Secondary | ICD-10-CM | POA: Diagnosis present

## 2023-10-18 DIAGNOSIS — R590 Localized enlarged lymph nodes: Secondary | ICD-10-CM

## 2023-10-18 DIAGNOSIS — I82B13 Acute embolism and thrombosis of subclavian vein, bilateral: Secondary | ICD-10-CM | POA: Diagnosis present

## 2023-10-18 DIAGNOSIS — I2693 Single subsegmental pulmonary embolism without acute cor pulmonale: Secondary | ICD-10-CM | POA: Diagnosis present

## 2023-10-18 DIAGNOSIS — R29898 Other symptoms and signs involving the musculoskeletal system: Secondary | ICD-10-CM | POA: Diagnosis present

## 2023-10-18 DIAGNOSIS — Z91148 Patient's other noncompliance with medication regimen for other reason: Secondary | ICD-10-CM

## 2023-10-18 DIAGNOSIS — Z923 Personal history of irradiation: Secondary | ICD-10-CM

## 2023-10-18 DIAGNOSIS — Z6835 Body mass index (BMI) 35.0-35.9, adult: Secondary | ICD-10-CM

## 2023-10-18 DIAGNOSIS — I82533 Chronic embolism and thrombosis of popliteal vein, bilateral: Secondary | ICD-10-CM | POA: Diagnosis present

## 2023-10-18 DIAGNOSIS — E785 Hyperlipidemia, unspecified: Secondary | ICD-10-CM | POA: Diagnosis present

## 2023-10-18 DIAGNOSIS — Z7901 Long term (current) use of anticoagulants: Secondary | ICD-10-CM

## 2023-10-18 DIAGNOSIS — K219 Gastro-esophageal reflux disease without esophagitis: Secondary | ICD-10-CM | POA: Diagnosis present

## 2023-10-18 DIAGNOSIS — I69391 Dysphagia following cerebral infarction: Secondary | ICD-10-CM

## 2023-10-18 DIAGNOSIS — R079 Chest pain, unspecified: Secondary | ICD-10-CM

## 2023-10-18 DIAGNOSIS — I82C11 Acute embolism and thrombosis of right internal jugular vein: Secondary | ICD-10-CM

## 2023-10-18 DIAGNOSIS — R0603 Acute respiratory distress: Secondary | ICD-10-CM | POA: Diagnosis present

## 2023-10-18 DIAGNOSIS — H5461 Unqualified visual loss, right eye, normal vision left eye: Secondary | ICD-10-CM | POA: Diagnosis present

## 2023-10-18 DIAGNOSIS — F17210 Nicotine dependence, cigarettes, uncomplicated: Secondary | ICD-10-CM | POA: Diagnosis present

## 2023-10-18 DIAGNOSIS — I5032 Chronic diastolic (congestive) heart failure: Secondary | ICD-10-CM | POA: Diagnosis present

## 2023-10-18 DIAGNOSIS — I350 Nonrheumatic aortic (valve) stenosis: Secondary | ICD-10-CM | POA: Diagnosis present

## 2023-10-18 DIAGNOSIS — Z85118 Personal history of other malignant neoplasm of bronchus and lung: Secondary | ICD-10-CM

## 2023-10-18 DIAGNOSIS — Z79899 Other long term (current) drug therapy: Secondary | ICD-10-CM

## 2023-10-18 DIAGNOSIS — I82A19 Acute embolism and thrombosis of unspecified axillary vein: Secondary | ICD-10-CM

## 2023-10-18 DIAGNOSIS — I82210 Acute embolism and thrombosis of superior vena cava: Secondary | ICD-10-CM | POA: Diagnosis present

## 2023-10-18 DIAGNOSIS — I82B19 Acute embolism and thrombosis of unspecified subclavian vein: Secondary | ICD-10-CM | POA: Diagnosis present

## 2023-10-18 DIAGNOSIS — Z5986 Financial insecurity: Secondary | ICD-10-CM

## 2023-10-18 DIAGNOSIS — C3411 Malignant neoplasm of upper lobe, right bronchus or lung: Secondary | ICD-10-CM | POA: Diagnosis present

## 2023-10-18 DIAGNOSIS — R29702 NIHSS score 2: Secondary | ICD-10-CM | POA: Diagnosis not present

## 2023-10-18 DIAGNOSIS — Z880 Allergy status to penicillin: Secondary | ICD-10-CM

## 2023-10-18 DIAGNOSIS — Z9221 Personal history of antineoplastic chemotherapy: Secondary | ICD-10-CM

## 2023-10-18 DIAGNOSIS — D649 Anemia, unspecified: Secondary | ICD-10-CM | POA: Diagnosis present

## 2023-10-18 DIAGNOSIS — H5462 Unqualified visual loss, left eye, normal vision right eye: Secondary | ICD-10-CM | POA: Diagnosis present

## 2023-10-18 DIAGNOSIS — R1310 Dysphagia, unspecified: Secondary | ICD-10-CM | POA: Diagnosis not present

## 2023-10-18 DIAGNOSIS — Z886 Allergy status to analgesic agent status: Secondary | ICD-10-CM

## 2023-10-18 DIAGNOSIS — Z832 Family history of diseases of the blood and blood-forming organs and certain disorders involving the immune mechanism: Secondary | ICD-10-CM

## 2023-10-18 DIAGNOSIS — R531 Weakness: Secondary | ICD-10-CM

## 2023-10-18 DIAGNOSIS — G08 Intracranial and intraspinal phlebitis and thrombophlebitis: Principal | ICD-10-CM | POA: Diagnosis present

## 2023-10-18 DIAGNOSIS — J4489 Other specified chronic obstructive pulmonary disease: Secondary | ICD-10-CM | POA: Diagnosis present

## 2023-10-18 DIAGNOSIS — I82A11 Acute embolism and thrombosis of right axillary vein: Secondary | ICD-10-CM | POA: Diagnosis present

## 2023-10-18 DIAGNOSIS — I252 Old myocardial infarction: Secondary | ICD-10-CM

## 2023-10-18 DIAGNOSIS — C771 Secondary and unspecified malignant neoplasm of intrathoracic lymph nodes: Secondary | ICD-10-CM | POA: Diagnosis present

## 2023-10-18 DIAGNOSIS — I82290 Acute embolism and thrombosis of other thoracic veins: Secondary | ICD-10-CM | POA: Diagnosis present

## 2023-10-18 LAB — BASIC METABOLIC PANEL WITH GFR
Anion gap: 12 (ref 5–15)
BUN: 14 mg/dL (ref 6–20)
CO2: 24 mmol/L (ref 22–32)
Calcium: 9.6 mg/dL (ref 8.9–10.3)
Chloride: 100 mmol/L (ref 98–111)
Creatinine, Ser: 0.71 mg/dL (ref 0.44–1.00)
GFR, Estimated: >60 mL/min (ref >60)
Glucose, Bld: 105 mg/dL — ABNORMAL HIGH (ref 70–99)
Potassium: 3.8 mmol/L (ref 3.5–5.1)
Sodium: 136 mmol/L (ref 135–145)

## 2023-10-18 LAB — CBC
HCT: 33.8 % — ABNORMAL LOW (ref 36.0–46.0)
HCT: 34.3 % — ABNORMAL LOW (ref 36.0–46.0)
Hemoglobin: 10.8 g/dL — ABNORMAL LOW (ref 12.0–15.0)
Hemoglobin: 10.8 g/dL — ABNORMAL LOW (ref 12.0–15.0)
MCH: 27.7 pg (ref 26.0–34.0)
MCH: 27.6 pg (ref 26.0–34.0)
MCHC: 32 g/dL (ref 30.0–36.0)
MCHC: 31.5 g/dL (ref 30.0–36.0)
MCV: 86.7 fL (ref 80.0–100.0)
MCV: 87.5 fL (ref 80.0–100.0)
Platelets: 170 K/uL (ref 150–400)
Platelets: 187 K/uL (ref 150–400)
RBC: 3.9 MIL/uL (ref 3.87–5.11)
RBC: 3.92 MIL/uL (ref 3.87–5.11)
RDW: 14.1 % (ref 11.5–15.5)
RDW: 14.1 % (ref 11.5–15.5)
WBC: 7 K/uL (ref 4.0–10.5)
WBC: 7.1 K/uL (ref 4.0–10.5)
nRBC: 0 % (ref 0.0–0.2)
nRBC: 0 % (ref 0.0–0.2)

## 2023-10-18 LAB — DIFFERENTIAL
Abs Immature Granulocytes: 0.02 K/uL (ref 0.00–0.07)
Basophils Absolute: 0 K/uL (ref 0.0–0.1)
Basophils Relative: 0 %
Eosinophils Absolute: 0.1 K/uL (ref 0.0–0.5)
Eosinophils Relative: 1 %
Immature Granulocytes: 0 %
Lymphocytes Relative: 14 %
Lymphs Abs: 1 K/uL (ref 0.7–4.0)
Monocytes Absolute: 0.6 K/uL (ref 0.1–1.0)
Monocytes Relative: 8 %
Neutro Abs: 5.4 K/uL (ref 1.7–7.7)
Neutrophils Relative %: 77 %

## 2023-10-18 LAB — PRO BRAIN NATRIURETIC PEPTIDE: Pro Brain Natriuretic Peptide: 781 pg/mL — ABNORMAL HIGH (ref <300.0)

## 2023-10-18 LAB — HEPATIC FUNCTION PANEL
ALT: 13 U/L (ref 0–44)
AST: 21 U/L (ref 15–41)
Albumin: 3.9 g/dL (ref 3.5–5.0)
Alkaline Phosphatase: 76 U/L (ref 38–126)
Bilirubin, Direct: 0.2 mg/dL (ref 0.0–0.2)
Indirect Bilirubin: 0.3 mg/dL (ref 0.3–0.9)
Total Bilirubin: 0.5 mg/dL (ref 0.0–1.2)
Total Protein: 7.8 g/dL (ref 6.5–8.1)

## 2023-10-18 LAB — TROPONIN T, HIGH SENSITIVITY: Troponin T High Sensitivity: 18 ng/L (ref 0–19)

## 2023-10-18 MED ORDER — HEPARIN (PORCINE) 25000 UT/250ML-% IV SOLN
1100.0000 [IU]/h | INTRAVENOUS | Status: DC
  Administered 2023-10-18: 1150 [IU]/h via INTRAVENOUS
  Administered 2023-10-19 – 2023-10-20 (×2): 1250 [IU]/h via INTRAVENOUS
  Administered 2023-10-21: 1100 [IU]/h via INTRAVENOUS
  Filled 2023-10-18 (×4): qty 250

## 2023-10-18 MED ORDER — IOHEXOL 350 MG/ML SOLN
100.0000 mL | Freq: Once | INTRAVENOUS | Status: AC | PRN
  Administered 2023-10-18: 100 mL via INTRAVENOUS

## 2023-10-18 MED ORDER — ALBUTEROL SULFATE HFA 108 (90 BASE) MCG/ACT IN AERS
2.0000 | INHALATION_SPRAY | RESPIRATORY_TRACT | Status: DC | PRN
  Administered 2023-10-19: 2 via RESPIRATORY_TRACT
  Filled 2023-10-18: qty 6.7

## 2023-10-18 MED ORDER — ACETAMINOPHEN 500 MG PO TABS
1000.0000 mg | ORAL_TABLET | Freq: Once | ORAL | Status: AC
Start: 2023-10-18 — End: 2023-10-18
  Administered 2023-10-18: 1000 mg via ORAL
  Filled 2023-10-18: qty 2

## 2023-10-18 MED ORDER — FENTANYL CITRATE PF 50 MCG/ML IJ SOSY
50.0000 ug | PREFILLED_SYRINGE | Freq: Once | INTRAMUSCULAR | Status: AC
  Administered 2023-10-18: 50 ug via INTRAVENOUS
  Filled 2023-10-18: qty 1

## 2023-10-18 MED ORDER — IPRATROPIUM-ALBUTEROL 0.5-2.5 (3) MG/3ML IN SOLN
3.0000 mL | Freq: Once | RESPIRATORY_TRACT | Status: AC
  Administered 2023-10-18: 3 mL via RESPIRATORY_TRACT
  Filled 2023-10-18: qty 3

## 2023-10-18 NOTE — Progress Notes (Signed)
 Hospitalist Transfer Note:    Nursing staff, Please call [facility] Admits & Consults System-Wide number on Amion [phone]) as soon as patient's arrival, so appropriate admitting provider can evaluate the pt.  Transferring facility: [facility] Requesting provider: [name], PA (EDP at [facility]) Reason for transfer: admission for further evaluation and management of acute diverticulitis in the setting of failed outpatient antibiotics.     58 year old female  who presented to [facility] ED complaining of 3 weeks of progressive lower abdominal discomfort.  Approximately 1 week ago he had presented to his PCP with complaints of lower abdominal discomfort, at which time PCP was able to arrange for outpatient CT abdomen/pelvis which was suggestive of acute diverticulitis.  The patient subsequently completed a course of Cipro/Flagyl, but is noted further ensuing progression of his lower abdominal discomfort, prompting him to present to [facility] this evening.  Vital signs in the ED were notable for the following: Afebrile, heart rates in the 60s to 80s; systolic blood pressures in the 110s to 130s mmHg.   Imaging in the ED today notable for CT abdomen/pelvis with contrast, which showed acute diverticulitis without evidence of obstruction, perforation, or abscess.  Medications administered prior to transfer included the following: Zosyn   Subsequently, I accepted this patient for transfer for inpatient admission to a med/tele bed at [facility] or [facility]  (first available) for further work-up and management of the above.        Andrea Pigg, DO Hospitalist

## 2023-10-18 NOTE — ED Notes (Signed)
 EDP made aware she continues to have a HA

## 2023-10-18 NOTE — ED Notes (Signed)
 Frozen meal with water given to pt

## 2023-10-18 NOTE — ED Provider Notes (Signed)
 [place] EMERGENCY DEPARTMENT AT [facility] Provider Note   CSN: [id number] Arrival date & time: 10/18/23  1305     Patient presents with: Chest Pain   Andrea Mullen is a 58 y.o. female.  {Add pertinent medical, surgical, social history, OB history to HPI:32947} HPI     57 year old female with a history of COPD history of small cell lung cancer (2007), with recent CTA showing concern for malignancy 2 days ago who presents with chest pain, and also reports left facial swelling, as well as 1 month of difficulty ambulating due to LLE weakness and 2 days of left hand numbness.   Left side of face swollen on and off Chest pain and shortness of breath began today Patient reports she is not short of breath but daughter reports she looks very winded. Her daughter reports inhaler has not been sent to pharmacy and she has been out of it Cough for 2 days No fever  No nausea or vomiting Dull ache chest pain.  Stays in center of chest without radiation.   Deep breath makes it worse Has had left facial swelling off and on, usually after laying down then sitting back up. No facial pain, no teeth pain Is having headaches constantly For one month has been unable to walk due to LLE weakness Past Medical History:  Diagnosis Date   Anxiety    Asthma    Blind    left eye   Cancer (HCC)    lung, tx radiation and chemo   Hyperlipemia    Hypertension    Stroke [facility])        Prior to Admission medications   Medication Sig Start Date End Date Taking? Authorizing Provider  albuterol  (PROVENTIL ) (2.5 MG/3ML) 0.083% nebulizer solution Take 3 mLs (2.5 mg total) by nebulization every 6 (six) hours as needed for wheezing or shortness of breath. 10/14/23   Ula Prentice SAUNDERS, MD  albuterol  (VENTOLIN  HFA) 108 (90 Base) MCG/ACT inhaler Inhale 2 puffs into the lungs every 4 (four) hours as needed for wheezing or shortness of breath. 10/14/23   Ula Prentice SAUNDERS, MD  butalbital -acetaminophen -caffeine   (FIORICET) 50-325-40 MG tablet Take 1-2 tablets by mouth every 6 (six) hours as needed for headache. 02/14/23 02/14/24  Juleen Rush, PA-C  rosuvastatin  (CRESTOR ) 40 MG tablet Take 1 tablet (40 mg total) by mouth daily. 01/07/22   Byrum, Robert S, MD  Vitamin D, Ergocalciferol, (DRISDOL) 1.25 MG (50000 UNIT) CAPS capsule Take 50,000 Units by mouth once a week. 02/09/23   [provider]    Allergies: Prednisone , Bicillin c-r, Ibuprofen , Nsaids, and Penicillins    Review of Systems  Updated Vital Signs BP (!) 118/90 (BP Location: Right Arm)   Pulse (!) 116   Temp 98.4 F (36.9 C) (Oral)   Resp 18   LMP 11/16/2016   SpO2 98%   Physical Exam Vitals and nursing note reviewed.  Constitutional:      General: She is not in acute distress.    Appearance: She is well-developed. She is not diaphoretic.  HENT:     Head: Normocephalic and atraumatic.  Eyes:     Conjunctiva/sclera: Conjunctivae normal.  Cardiovascular:     Rate and Rhythm: Normal rate and regular rhythm.     Heart sounds: Normal heart sounds. No murmur heard.    No friction rub. No gallop.  Pulmonary:     Effort: Pulmonary effort is normal. No respiratory distress.     Breath sounds: Normal breath sounds.  No wheezing or rales.  Abdominal:     General: There is no distension.     Palpations: Abdomen is soft.     Tenderness: There is no abdominal tenderness. There is no guarding.  Musculoskeletal:        General: No tenderness.     Cervical back: Normal range of motion.  Skin:    General: Skin is warm and dry.     Findings: No erythema or rash.  Neurological:     Mental Status: She is alert and oriented to person, place, and time.     Motor: Weakness (LLE, LUE 4/5) present.     Comments: Numbness left hand, no other sensory deficit     (all labs ordered are listed, but only abnormal results are displayed) Labs Reviewed  BASIC METABOLIC PANEL WITH GFR  CBC  TROPONIN T, HIGH SENSITIVITY     EKG: None  Radiology: DG Chest 2 View Result Date: 10/18/2023 CLINICAL DATA:  Chest pain EXAM: CHEST - 2 VIEW COMPARISON:  Chest radiograph October 14, 2023, CT angio chest October 14, 2023 FINDINGS: Thick-walled cavitary lesion with air-fluid level in right upper lobe, measuring approximately 5 cm. No new lesion or consolidation identified. Cardiomediastinal silhouette is enlarged. Fullness of right paratracheal correlating to mediastinal lymphadenopathy. No pleural effusion or pneumothorax. Multilevel degenerative changes of the spine. Nodular opacity superimposing lower thoracic spine on lateral view correlates to osteophytes better assessed on recent CT angio chest. IMPRESSION: Thick-walled cavitary lesion of right upper lobe. Right lower paratracheal mediastinal lymphadenopathy. Electronically Signed   By: Megan  Zare M.D.   On: 10/18/2023 14:34    {Document cardiac monitor, telemetry assessment procedure when appropriate:32947} Procedures   Medications Ordered in the ED - No data to display    {Click here for ABCD2, HEART and other calculators REFRESH Note before signing:1}                               58 year old female with a history of COPD history of small cell lung cancer (2007), with recent CTA showing concern for malignancy 2 days ago who presents with chest pain, and also reports left facial swelling, as well as 1 month of difficulty ambulating due to LLE weakness and 2 days of left hand numbness.   Differential diagnosis for chest pain includes pulmonary embolus, dissection, pneumothorax, pneumonia, ACS, myocarditis, pericarditis.    EKG was done and evaluate by me and showed no acute ST changes and no signs of pericarditis. Chest x-ray was done and evaluated by me and radiology and showed no sign of pneumonia or pneumothorax.   Labs completed and personally evaluated and interpreted by me show no electrolyte abnormalities no anemia or leukocytosis, normal troponin.  Low  suspicion for ACS, aortic dissection.  While she did just have a CT PE study done a few days ago, concern given her tachycardia, cancer history, chest pain and dyspnea for PE remains, and ordered a repeat CT PE study.  Given her left facial swelling, concern for possible Lemierre's syndrome, or thrombus in the IJ or other obstructive phenomenon and CT soft tissue neck was ordered.  Given her progressive neurologic symptoms over the last month, a CT head with and without contrast was ordered to screen for metastatic disease, as well as to evaluate for signs of intracranial hemorrhage in the setting of recent fall.     {Document critical care time when appropriate  Document review of  labs and clinical decision tools ie CHADS2VASC2, etc  Document your independent review of radiology images and any outside records  Document your discussion with family members, caretakers and with consultants  Document social determinants of health affecting pt's care  Document your decision making why or why not admission, treatments were needed:32947:::1}   Final diagnoses:  None    ED Discharge Orders     None

## 2023-10-18 NOTE — ED Notes (Signed)
 Patient transported to CT

## 2023-10-18 NOTE — Progress Notes (Signed)
 PHARMACY - ANTICOAGULATION CONSULT NOTE  Pharmacy Consult for heparin  Indication: Dural venous thrombus  Allergies  Allergen Reactions   Prednisone  Anaphylaxis    seizure   Bicillin C-R Hives   Ibuprofen  Nausea Only   Nsaids Other (See Comments)    Hives    Penicillins Hives    Has patient had a PCN reaction causing immediate rash, facial/tongue/throat swelling, SOB or lightheadedness with hypotension: No Has patient had a PCN reaction causing severe rash involving mucus membranes or skin necrosis: Yes Has patient had a PCN reaction that required hospitalization No Has patient had a PCN reaction occurring within the last 10 years: Yes  If all of the above answers are NO, then may proceed with Cephalosporin use.     Patient Measurements:    Vital Signs: Temp: 98.4 F (36.9 C) (09/08 1331) Temp Source: Oral (09/08 1331) BP: 125/90 (09/08 1702) Pulse Rate: 96 (09/08 1702)  Labs: Recent Labs    10/18/23 1630  HGB 10.8*  HCT 33.8*  PLT 170  CREATININE 0.71    CrCl cannot be calculated (Unknown ideal weight.).   Medical History: Past Medical History:  Diagnosis Date   Anxiety    Asthma    Blind    left eye   Cancer (HCC)    lung, tx radiation and chemo   Hyperlipemia    Hypertension    Stroke [facility])      Assessment: [name] presenting with chest tightness and neurologic sx, CT head shows dural venous sinus thrombosis, also thrombus in brachiocephalic vein extending to multiple areas.  She is not on anticoagulation PTA  Goal of Therapy:  Heparin  level 0.3-0.5 units/ml Monitor platelets by anticoagulation protocol: Yes   Plan:  Heparin  1150 units/hr, no bolus F/u 6 hour heparin  level F/u long term AC plan  Dorn Poot, PharmD, [facility] Clinical Pharmacist ED Pharmacist Phone # [phone] 10/18/2023 8:38 PM

## 2023-10-18 NOTE — ED Notes (Signed)
 EDP made aware that her head is still hurting

## 2023-10-18 NOTE — ED Triage Notes (Signed)
 Patient states chest tightness today. Reports hx of lung cancer. Also states intermittent swelling of right side of face for 2 days.

## 2023-10-19 ENCOUNTER — Inpatient Hospital Stay (HOSPITAL_COMMUNITY)

## 2023-10-19 ENCOUNTER — Other Ambulatory Visit: Payer: Self-pay

## 2023-10-19 ENCOUNTER — Telehealth (HOSPITAL_COMMUNITY): Payer: Self-pay | Admitting: Pharmacy Technician

## 2023-10-19 ENCOUNTER — Other Ambulatory Visit (HOSPITAL_COMMUNITY): Payer: Self-pay

## 2023-10-19 ENCOUNTER — Encounter (HOSPITAL_COMMUNITY): Payer: Self-pay | Admitting: Internal Medicine

## 2023-10-19 ENCOUNTER — Emergency Department (HOSPITAL_BASED_OUTPATIENT_CLINIC_OR_DEPARTMENT_OTHER)

## 2023-10-19 DIAGNOSIS — C3411 Malignant neoplasm of upper lobe, right bronchus or lung: Secondary | ICD-10-CM | POA: Diagnosis present

## 2023-10-19 DIAGNOSIS — G08 Intracranial and intraspinal phlebitis and thrombophlebitis: Secondary | ICD-10-CM

## 2023-10-19 DIAGNOSIS — I82B11 Acute embolism and thrombosis of right subclavian vein: Secondary | ICD-10-CM

## 2023-10-19 DIAGNOSIS — I6381 Other cerebral infarction due to occlusion or stenosis of small artery: Secondary | ICD-10-CM | POA: Diagnosis not present

## 2023-10-19 DIAGNOSIS — R590 Localized enlarged lymph nodes: Secondary | ICD-10-CM

## 2023-10-19 DIAGNOSIS — R079 Chest pain, unspecified: Secondary | ICD-10-CM | POA: Diagnosis present

## 2023-10-19 DIAGNOSIS — I82B19 Acute embolism and thrombosis of unspecified subclavian vein: Secondary | ICD-10-CM | POA: Diagnosis present

## 2023-10-19 DIAGNOSIS — I82210 Acute embolism and thrombosis of superior vena cava: Secondary | ICD-10-CM | POA: Diagnosis present

## 2023-10-19 DIAGNOSIS — I5032 Chronic diastolic (congestive) heart failure: Secondary | ICD-10-CM | POA: Diagnosis present

## 2023-10-19 DIAGNOSIS — D649 Anemia, unspecified: Secondary | ICD-10-CM | POA: Diagnosis present

## 2023-10-19 DIAGNOSIS — J4489 Other specified chronic obstructive pulmonary disease: Secondary | ICD-10-CM | POA: Diagnosis present

## 2023-10-19 DIAGNOSIS — C342 Malignant neoplasm of middle lobe, bronchus or lung: Secondary | ICD-10-CM | POA: Diagnosis present

## 2023-10-19 DIAGNOSIS — Z86711 Personal history of pulmonary embolism: Secondary | ICD-10-CM | POA: Diagnosis not present

## 2023-10-19 DIAGNOSIS — I2510 Atherosclerotic heart disease of native coronary artery without angina pectoris: Secondary | ICD-10-CM | POA: Diagnosis not present

## 2023-10-19 DIAGNOSIS — I739 Peripheral vascular disease, unspecified: Secondary | ICD-10-CM | POA: Diagnosis not present

## 2023-10-19 DIAGNOSIS — I82290 Acute embolism and thrombosis of other thoracic veins: Secondary | ICD-10-CM | POA: Diagnosis present

## 2023-10-19 DIAGNOSIS — Z85118 Personal history of other malignant neoplasm of bronchus and lung: Secondary | ICD-10-CM

## 2023-10-19 DIAGNOSIS — I8290 Acute embolism and thrombosis of unspecified vein: Secondary | ICD-10-CM | POA: Insufficient documentation

## 2023-10-19 DIAGNOSIS — D6869 Other thrombophilia: Secondary | ICD-10-CM | POA: Diagnosis present

## 2023-10-19 DIAGNOSIS — I82A11 Acute embolism and thrombosis of right axillary vein: Secondary | ICD-10-CM | POA: Diagnosis present

## 2023-10-19 DIAGNOSIS — I82B13 Acute embolism and thrombosis of subclavian vein, bilateral: Secondary | ICD-10-CM | POA: Diagnosis present

## 2023-10-19 DIAGNOSIS — J984 Other disorders of lung: Secondary | ICD-10-CM | POA: Diagnosis not present

## 2023-10-19 DIAGNOSIS — I350 Nonrheumatic aortic (valve) stenosis: Secondary | ICD-10-CM | POA: Diagnosis present

## 2023-10-19 DIAGNOSIS — Z9221 Personal history of antineoplastic chemotherapy: Secondary | ICD-10-CM | POA: Diagnosis not present

## 2023-10-19 DIAGNOSIS — E785 Hyperlipidemia, unspecified: Secondary | ICD-10-CM | POA: Diagnosis present

## 2023-10-19 DIAGNOSIS — F17210 Nicotine dependence, cigarettes, uncomplicated: Secondary | ICD-10-CM | POA: Diagnosis present

## 2023-10-19 DIAGNOSIS — C801 Malignant (primary) neoplasm, unspecified: Secondary | ICD-10-CM | POA: Diagnosis not present

## 2023-10-19 DIAGNOSIS — I10 Essential (primary) hypertension: Secondary | ICD-10-CM | POA: Diagnosis not present

## 2023-10-19 DIAGNOSIS — I871 Compression of vein: Secondary | ICD-10-CM

## 2023-10-19 DIAGNOSIS — K219 Gastro-esophageal reflux disease without esophagitis: Secondary | ICD-10-CM | POA: Diagnosis present

## 2023-10-19 DIAGNOSIS — I2693 Single subsegmental pulmonary embolism without acute cor pulmonale: Secondary | ICD-10-CM | POA: Diagnosis present

## 2023-10-19 DIAGNOSIS — R0603 Acute respiratory distress: Secondary | ICD-10-CM | POA: Diagnosis present

## 2023-10-19 DIAGNOSIS — I69391 Dysphagia following cerebral infarction: Secondary | ICD-10-CM | POA: Diagnosis not present

## 2023-10-19 DIAGNOSIS — Z7901 Long term (current) use of anticoagulants: Secondary | ICD-10-CM | POA: Diagnosis not present

## 2023-10-19 DIAGNOSIS — I110 Hypertensive heart disease with heart failure: Secondary | ICD-10-CM | POA: Diagnosis present

## 2023-10-19 DIAGNOSIS — C771 Secondary and unspecified malignant neoplasm of intrathoracic lymph nodes: Secondary | ICD-10-CM | POA: Diagnosis present

## 2023-10-19 DIAGNOSIS — I6350 Cerebral infarction due to unspecified occlusion or stenosis of unspecified cerebral artery: Secondary | ICD-10-CM | POA: Diagnosis not present

## 2023-10-19 DIAGNOSIS — R29898 Other symptoms and signs involving the musculoskeletal system: Secondary | ICD-10-CM | POA: Diagnosis present

## 2023-10-19 DIAGNOSIS — I2699 Other pulmonary embolism without acute cor pulmonale: Secondary | ICD-10-CM | POA: Diagnosis not present

## 2023-10-19 DIAGNOSIS — R29702 NIHSS score 2: Secondary | ICD-10-CM | POA: Diagnosis not present

## 2023-10-19 DIAGNOSIS — E66812 Obesity, class 2: Secondary | ICD-10-CM | POA: Diagnosis present

## 2023-10-19 DIAGNOSIS — I82533 Chronic embolism and thrombosis of popliteal vein, bilateral: Secondary | ICD-10-CM | POA: Diagnosis present

## 2023-10-19 DIAGNOSIS — R1310 Dysphagia, unspecified: Secondary | ICD-10-CM | POA: Diagnosis not present

## 2023-10-19 LAB — HEPARIN LEVEL (UNFRACTIONATED)
Heparin Unfractionated: 0.27 [IU]/mL — ABNORMAL LOW (ref 0.30–0.70)
Heparin Unfractionated: 0.42 [IU]/mL (ref 0.30–0.70)
Heparin Unfractionated: 0.45 [IU]/mL (ref 0.30–0.70)

## 2023-10-19 LAB — MRSA NEXT GEN BY PCR, NASAL: MRSA by PCR Next Gen: NOT DETECTED

## 2023-10-19 LAB — HIV ANTIBODY (ROUTINE TESTING W REFLEX): HIV Screen 4th Generation wRfx: NONREACTIVE

## 2023-10-19 MED ORDER — MORPHINE SULFATE (PF) 4 MG/ML IV SOLN
4.0000 mg | Freq: Once | INTRAVENOUS | Status: AC
  Administered 2023-10-19: 4 mg via INTRAVENOUS
  Filled 2023-10-19: qty 1

## 2023-10-19 MED ORDER — ACETAMINOPHEN 650 MG RE SUPP: 650.0000 mg | Freq: Four times a day (QID) | RECTAL | Status: DC | PRN

## 2023-10-19 MED ORDER — GADOBUTROL 1 MMOL/ML IV SOLN
10.0000 mL | Freq: Once | INTRAVENOUS | Status: AC | PRN
Start: 2023-10-19 — End: 2023-10-19
  Administered 2023-10-19: 10 mL via INTRAVENOUS

## 2023-10-19 MED ORDER — IOHEXOL 300 MG/ML  SOLN
100.0000 mL | Freq: Once | INTRAMUSCULAR | Status: AC | PRN
  Administered 2023-10-19: 75 mL via INTRAVENOUS

## 2023-10-19 MED ORDER — SODIUM CHLORIDE 0.9% FLUSH
3.0000 mL | Freq: Two times a day (BID) | INTRAVENOUS | Status: DC
  Administered 2023-10-22 – 2023-10-27 (×6): 3 mL via INTRAVENOUS

## 2023-10-19 MED ORDER — HYDROCODONE-ACETAMINOPHEN 5-325 MG PO TABS
1.0000 | ORAL_TABLET | Freq: Four times a day (QID) | ORAL | Status: DC | PRN
Start: 2023-10-19 — End: 2023-10-27
  Administered 2023-10-19 – 2023-10-24 (×10): 1 via ORAL
  Filled 2023-10-19 (×11): qty 1

## 2023-10-19 MED ORDER — ALBUTEROL SULFATE (2.5 MG/3ML) 0.083% IN NEBU
2.5000 mg | INHALATION_SOLUTION | RESPIRATORY_TRACT | Status: DC | PRN
  Administered 2023-10-21 – 2023-10-24 (×8): 2.5 mg via RESPIRATORY_TRACT
  Filled 2023-10-19 (×8): qty 3

## 2023-10-19 MED ORDER — ACETAMINOPHEN 325 MG PO TABS
650.0000 mg | ORAL_TABLET | Freq: Four times a day (QID) | ORAL | Status: DC | PRN
  Administered 2023-10-20 – 2023-10-27 (×5): 650 mg via ORAL
  Filled 2023-10-19 (×5): qty 2

## 2023-10-19 MED ORDER — ALBUTEROL SULFATE HFA 108 (90 BASE) MCG/ACT IN AERS
2.0000 | INHALATION_SPRAY | Freq: Once | RESPIRATORY_TRACT | Status: AC
  Administered 2023-10-19: 2 via RESPIRATORY_TRACT

## 2023-10-19 MED ORDER — ALBUTEROL SULFATE (2.5 MG/3ML) 0.083% IN NEBU
2.5000 mg | INHALATION_SOLUTION | Freq: Four times a day (QID) | RESPIRATORY_TRACT | Status: DC | PRN
  Administered 2023-10-19: 2.5 mg via RESPIRATORY_TRACT
  Filled 2023-10-19: qty 3

## 2023-10-19 NOTE — ED Notes (Signed)
 Andrea Mullen from CL for equip-transport confirmed

## 2023-10-19 NOTE — Plan of Care (Signed)
 Problem: Education: Goal: Knowledge of General Education information will improve Description: Including pain rating scale, medication(s)/side effects and non-pharmacologic comfort measures 10/19/2023 2019 by Valorie Inocente NOVAK, RN Outcome: Progressing 10/19/2023 2019 by Valorie Inocente NOVAK, RN Outcome: Progressing   Problem: Health Behavior/Discharge Planning: Goal: Ability to manage health-related needs will improve 10/19/2023 2019 by Valorie Inocente NOVAK, RN Outcome: Progressing 10/19/2023 2019 by Valorie Inocente NOVAK, RN Outcome: Progressing   Problem: Clinical Measurements: Goal: Ability to maintain clinical measurements within normal limits will improve 10/19/2023 2019 by Valorie Inocente NOVAK, RN Outcome: Progressing 10/19/2023 2019 by Valorie Inocente NOVAK, RN Outcome: Progressing Goal: Will remain free from infection 10/19/2023 2019 by Valorie Inocente NOVAK, RN Outcome: Progressing 10/19/2023 2019 by Valorie Inocente NOVAK, RN Outcome: Progressing Goal: Diagnostic test results will improve 10/19/2023 2019 by Valorie Inocente NOVAK, RN Outcome: Progressing 10/19/2023 2019 by Valorie Inocente NOVAK, RN Outcome: Progressing Goal: Respiratory complications will improve 10/19/2023 2019 by Valorie Inocente NOVAK, RN Outcome: Progressing 10/19/2023 2019 by Valorie Inocente NOVAK, RN Outcome: Progressing Goal: Cardiovascular complication will be avoided 10/19/2023 2019 by Valorie Inocente NOVAK, RN Outcome: Progressing 10/19/2023 2019 by Valorie Inocente NOVAK, RN Outcome: Progressing   Problem: Activity: Goal: Risk for activity intolerance will decrease 10/19/2023 2019 by Valorie Inocente NOVAK, RN Outcome: Progressing 10/19/2023 2019 by Valorie Inocente NOVAK, RN Outcome: Progressing   Problem: Nutrition: Goal: Adequate nutrition will be maintained 10/19/2023 2019 by Valorie Inocente NOVAK, RN Outcome: Progressing 10/19/2023 2019 by Valorie Inocente NOVAK, RN Outcome: Progressing   Problem: Coping: Goal: Level of anxiety will decrease 10/19/2023 2019 by Valorie Inocente NOVAK, RN Outcome:  Progressing 10/19/2023 2019 by Valorie Inocente NOVAK, RN Outcome: Progressing   Problem: Elimination: Goal: Will not experience complications related to bowel motility 10/19/2023 2019 by Valorie Inocente NOVAK, RN Outcome: Progressing 10/19/2023 2019 by Valorie Inocente NOVAK, RN Outcome: Progressing Goal: Will not experience complications related to urinary retention 10/19/2023 2019 by Valorie Inocente NOVAK, RN Outcome: Progressing 10/19/2023 2019 by Valorie Inocente NOVAK, RN Outcome: Progressing   Problem: Pain Managment: Goal: General experience of comfort will improve and/or be controlled 10/19/2023 2019 by Valorie Inocente NOVAK, RN Outcome: Progressing 10/19/2023 2019 by Valorie Inocente NOVAK, RN Outcome: Progressing   Problem: Safety: Goal: Ability to remain free from injury will improve 10/19/2023 2019 by Valorie Inocente NOVAK, RN Outcome: Progressing 10/19/2023 2019 by Valorie Inocente NOVAK, RN Outcome: Progressing   Problem: Skin Integrity: Goal: Risk for impaired skin integrity will decrease 10/19/2023 2019 by Valorie Inocente NOVAK, RN Outcome: Progressing 10/19/2023 2019 by Valorie Inocente NOVAK, RN Outcome: Progressing   Problem: Education: Goal: Knowledge of disease or condition will improve Outcome: Progressing Goal: Knowledge of secondary prevention will improve (MUST DOCUMENT ALL) Outcome: Progressing Goal: Knowledge of patient specific risk factors will improve (DELETE if not current risk factor) Outcome: Progressing   Problem: Ischemic Stroke/TIA Tissue Perfusion: Goal: Complications of ischemic stroke/TIA will be minimized Outcome: Progressing   Problem: Coping: Goal: Will verbalize positive feelings about self Outcome: Progressing Goal: Will identify appropriate support needs Outcome: Progressing   Problem: Health Behavior/Discharge Planning: Goal: Ability to manage health-related needs will improve Outcome: Progressing Goal: Goals will be collaboratively established with patient/family Outcome: Progressing    Problem: Self-Care: Goal: Ability to participate in self-care as condition permits will improve Outcome: Progressing Goal: Verbalization of feelings and concerns over difficulty with self-care will improve Outcome: Progressing Goal: Ability to communicate needs accurately will improve Outcome: Progressing   Problem: Nutrition: Goal: Risk of aspiration will decrease Outcome: Progressing  Goal: Dietary intake will improve Outcome: Progressing

## 2023-10-19 NOTE — Telephone Encounter (Signed)
Patient Product/process development scientist completed.    The patient is insured through [facility] MEDICAID.     Ran test claim for Eliquis 5 mg and the current 30 day co-pay is $4.00.  Ran test claim for Xarelto 20 mg and the current 30 day co-pay is $4.00.  This test claim was processed through [facility]- copay amounts may vary at other pharmacies due to pharmacy/plan contracts, or as the patient moves through the different stages of their insurance plan.     Roland Earl, CPHT Pharmacy Technician III Certified Patient Advocate [facility] Patient Advocate Team Direct Number: [phone]  Fax: [phone]

## 2023-10-19 NOTE — Consult Note (Addendum)
 NAME:  Andrea Mullen, MRN:  [id number], DOB:  03-Jul-1965, LOS: 0 ADMISSION DATE:  10/18/2023, CONSULTATION DATE:  10/19/23 REFERRING MD:  Andrea Mullen , CHIEF COMPLAINT:  abnormal Ct chest    History of Present Illness:  58 yo F PMH prior small cell lung cancer tx at [facility], Severe Aortic stenosis, dHF + RV dysfunction HTN, HLD, Lung nodules, active tobacco use  which look to be lost to follow up since 2023 who presented to ED 10/18/23 w chest tightness. Of note, was in ED 9/4 w SOB and at that time was found to have a cavitary RUL lung mass + bulky lymphadenopathy c/f malignancy which aligns with location of prior RUL nodule in 2023. 9/4 imaging did not have evidence of a PE. A referral was made to oncology and she was dc home.   On presentation 9/8 she had repeat CTA chest which shows previously noted cavitary mass + bulky lymphadenopathy as well segmental and subsegmental PE + nonocclusive SVC thrombus + nonocclusive thrombus R azygous vein + nonocclusive thrombus in R axillary vein + nonocclusive R>L subclavian thrombus + occlusive thrombus in R brachiocephalic vein.  The patient also underwent CT H CT soft tissue neck, and was found to have subocclusive thrombus R internal jugular into R transverse and sigmoid sinuses, concerning for dural venous sinus thrombosis.  She was started on hep gtt, accepted in admission to [facility] with [facility] and neuro to consult. Arrived at [facility] 9/9  PCCM is consulted on her arrival for evaluation of her abnormal CT chest, c/f malignancy   Pertinent  Medical History  Squamous cell lung cancer HTN HLD R eye blindness  RUL lung nodule  Tobacco use  Hx AS  Prior stroke  RV dysfunction Diastolic HF  Severe Aortic stenosis   Significant Hospital Events: Including procedures, antibiotic start and stop dates in addition to other pertinent events   9/8 ED presentation. RUL mass PE RUE DVT dural venous sinus thrombosis  9/9 arrives to [facility]   Interim History / Subjective:  Arrives  to [facility]   Consulted  Objective    Blood pressure 123/88, pulse 100, temperature 98.5 F (36.9 C), temperature source Oral, resp. rate 16, height 5' 5 (1.651 m), weight 98.1 kg, last menstrual period 11/16/2016, SpO2 100%.        Intake/Output Summary (Last 24 hours) at 10/19/2023 1557 Last data filed at 10/19/2023 1256 Gross per 24 hour  Intake 191.98 ml  Output --  Net 191.98 ml   Filed Weights   10/19/23 1456  Weight: 98.1 kg    Examination: General: chronically ill adult F NAD  HENT: NCAT  Lungs: incr RR  Cardiovascular: tachycardic  Abdomen: soft  Extremities: edematous appearing  Neuro: Awake oriented x 3  GU: defer   Resolved problem list   Assessment and Plan   RUL cavitary mass R hilar lymphadenopathy Hx RUL lung nodules Hx prior lung cancer (2007, tx at [facility] w chemo rad)  P -will d/w Dr Andrea Mullen -- not sure what the safest path for workup is, especially in setting of heme concerns below -think we will need to talk w pts daughter Andrea Mullen) about this -- pt does not seem to have a great understanding of what we are talking about re CT findings, possible work up etc.  -add'l neuro imaging as below   Dural venous sinus thrombosis  PE SVC thrombus RUE DVT  -R>L subclavian, R axillary, azygous, + occlusive R brachiocephalic P -hep gtt -ECHO  -will  need MRI brain   -neuro to see -fq neuro checks  -case was d/w VVS + EM and VVS to consult formally -- hope is to cont hep gtt.  -BLE doppler   Severe AS Diastolic HF RV dysfunction P -ECHO as above  ?acute encephalopathy vs memory loss  Hx CVA  Fq falls  -reports to me she doesn't recall being in the ED a few days ago, does recall being told of her CT findings then or this presentation, etc. Looking at her recent PCP note it seems like she was confused -as above will go for MRI brain at some point. -delirium precautions - limit CNS depressing meds as able  -I see statin is not ordered w reported home  non-compliance. Encourage pt reconsideration    Labs   CBC: Recent Labs  Lab 10/14/23 0830 10/18/23 1630  WBC 7.4 7.1  7.0  NEUTROABS 5.7 5.4  HGB 11.7* 10.8*  10.8*  HCT 37.6 34.3*  33.8*  MCV 88.7 87.5  86.7  PLT 167 187  170    Basic Metabolic Panel: Recent Labs  Lab 10/14/23 0930 10/18/23 1630  NA 138 136  K 3.8 3.8  CL 102 100  CO2 25 24  GLUCOSE 107* 105*  BUN 11 14  CREATININE 0.81 0.71  CALCIUM  8.9 9.6   GFR: Estimated Creatinine Clearance: 91 mL/min (by C-G formula based on SCr of 0.71 mg/dL). Recent Labs  Lab 10/14/23 0830 10/18/23 1630  WBC 7.4 7.1  7.0    Liver Function Tests: Recent Labs  Lab 10/18/23 1630  AST 21  ALT 13  ALKPHOS 76  BILITOT 0.5  PROT 7.8  ALBUMIN 3.9   No results for input(s): LIPASE, AMYLASE in the last 168 hours. No results for input(s): AMMONIA in the last 168 hours.  ABG No results found for: PHART, PCO2ART, PO2ART, HCO3, TCO2, ACIDBASEDEF, O2SAT   Coagulation Profile: No results for input(s): INR, PROTIME in the last 168 hours.  Cardiac Enzymes: No results for input(s): CKTOTAL, CKMB, CKMBINDEX, TROPONINI in the last 168 hours.  HbA1C: Hgb A1c MFr Bld  Date/Time Value Ref Range Status  11/08/2021 11:07 PM 5.3 4.8 - 5.6 % Final    Comment:    (NOTE) Pre diabetes:          5.7%-6.4%  Diabetes:              >6.4%  Glycemic control for   <7.0% adults with diabetes   10/30/2014 09:00 PM 5.9 (H) 4.8 - 5.6 % Final    Comment:    (NOTE)         Pre-diabetes: 5.7 - 6.4         Diabetes: >6.4         Glycemic control for adults with diabetes: <7.0     CBG: No results for input(s): GLUCAP in the last 168 hours.  Review of Systems:   Review of Systems  Respiratory:  Positive for shortness of breath.   Cardiovascular:  Positive for chest pain and leg swelling.  Neurological:  Positive for headaches.  Psychiatric/Behavioral:  Positive for memory loss.       Past Medical History:  She,  has a past medical history of Anxiety, Asthma, Blind, Cancer (HCC), Hyperlipemia, Hypertension, and Stroke (HCC).   Surgical History:   Past Surgical History:  Procedure Laterality Date   CARDIAC CATHETERIZATION N/A 11/01/2014   Procedure: Left Heart Cath and Coronary Angiography;  Surgeon: Victory LELON Sharps, MD;  Location: [facility]  INVASIVE CV LAB;  Service: Cardiovascular;  Laterality: N/A;   CESAREAN SECTION     x3   HERNIA REPAIR       Social History:   reports that she has been smoking cigarettes. She has never used smokeless tobacco. She reports that she does not currently use alcohol. She reports that she does not currently use drugs after having used the following drugs: Marijuana and Cocaine .   Family History:  Her family history includes Clotting disorder in her mother.   Allergies Allergies  Allergen Reactions   Prednisone  Anaphylaxis    seizure   Nsaids Hives    Hives    Penicillins Hives        Bicillin C-R Hives     Home Medications  Prior to Admission medications   Medication Sig Start Date End Date Taking? Authorizing Provider  albuterol  (VENTOLIN  HFA) 108 (90 Base) MCG/ACT inhaler Inhale 2 puffs into the lungs every 4 (four) hours as needed for wheezing or shortness of breath. 10/14/23  Yes Ula Prentice SAUNDERS, MD  albuterol  (PROVENTIL ) (2.5 MG/3ML) 0.083% nebulizer solution Take 3 mLs (2.5 mg total) by nebulization every 6 (six) hours as needed for wheezing or shortness of breath. Patient not taking: Reported on 10/19/2023 10/14/23   Ula Prentice SAUNDERS, MD  butalbital -acetaminophen -caffeine  (FIORICET) 50-325-40 MG tablet Take 1-2 tablets by mouth every 6 (six) hours as needed for headache. Patient not taking: Reported on 10/19/2023 02/14/23 02/14/24  Juleen Rush, PA-C  rosuvastatin  (CRESTOR ) 40 MG tablet Take 1 tablet (40 mg total) by mouth daily. Patient not taking: Reported on 10/19/2023 01/07/22   Byrum, Robert S, MD  Vitamin D, Ergocalciferol,  (DRISDOL) 1.25 MG (50000 UNIT) CAPS capsule Take 50,000 Units by mouth once a week. Patient not taking: Reported on 10/19/2023 02/09/23   [provider]     Critical care time: na     High mdm   Ronnald Gave MSN, AGACNP-BC [facility] Pulmonary/Critical Care Medicine 10/19/2023, 5:15 PM

## 2023-10-19 NOTE — TOC CM/SW Note (Signed)
 Transition of Care [facility]) - Inpatient Brief Assessment   Patient Details  Name: Andrea Mullen MRN: [id number] Date of Birth: [birth year]  [facility] CM/SW Contact:    Lauraine FORBES Saa, LCSWA Phone Number: 10/19/2023, 2:02 PM   Clinical Narrative:  2:02 PM Per chart review, patient resides at home with child(ren). Patient has a PCP and insurance. Patient does not have SNF history. Patient has HH history with [facility]. Patient has DME (rolling walker, manual wheelchair) history. Patient's preferred pharmacy's are [facility] and [facility] [phone] [facility]. No TOC needs identified at this time. TOC will continue to follow and be available to assist.  Transition of Care Asessment: Insurance and Status: Insurance coverage has been reviewed Patient has primary care physician: Yes Home environment has been reviewed: Private Residence Prior level of function:: N/A Prior/Current Home Services: No current home services Social Drivers of Health Review: SDOH reviewed no interventions necessary Readmission risk has been reviewed: Yes (Currently Yellow 18%) Transition of care needs: no transition of care needs at this time

## 2023-10-19 NOTE — Progress Notes (Signed)
 PHARMACY - ANTICOAGULATION CONSULT NOTE  Pharmacy Consult for heparin  Indication: Dural venous thrombus  Allergies  Allergen Reactions   Prednisone  Anaphylaxis    seizure   Nsaids Hives    Hives    Penicillins Hives        Bicillin C-R Hives    Patient Measurements: Height: 5' 5 (165.1 cm) Weight: 98.1 kg (216 lb 4.3 oz) IBW/kg (Calculated) : 57 HEPARIN  DW (KG): 79.3  Vital Signs: Temp: 98.7 F (37.1 C) (09/09 1734) Temp Source: Oral (09/09 1734) BP: 103/72 (09/09 1734) Pulse Rate: 111 (09/09 1734)  Labs: Recent Labs    10/18/23 1630 10/19/23 0318 10/19/23 1806 10/19/23 1808  HGB 10.8*  10.8*  --   --   --   HCT 34.3*  33.8*  --   --   --   PLT 187  170  --   --   --   HEPARINUNFRC  --  0.27* 0.45 0.42  CREATININE 0.71  --   --   --     Estimated Creatinine Clearance: 91 mL/min (by C-G formula based on SCr of 0.71 mg/dL).   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  She is not on anticoagulation PTA  -heparin  level= 0.42 and at goal on 1250 units/hr  Goal of Therapy:  Heparin  level 0.3-0.5 units/ml Monitor platelets by anticoagulation protocol: Yes   Plan:  -Continue heparin  1250 units/hr -Heparin  level and CBC in am  Prentice Poisson, PharmD Clinical Pharmacist **Pharmacist phone directory can now be found on [email or web address] ([email or web address]).  Listed under [facility].

## 2023-10-19 NOTE — Consult Note (Signed)
 Hospital Consult    Reason for Consult:  svc syndrome Referring Physician:  Dr. Claudene MRN #:  [id number]  History of Present Illness: This is a 58 y.o. female without significant history of vascular disease does have a history of hyperlipidemia and hypertension.  She states that she has had an upper extremity blood clot in the past was not related to previous malignancy which was small cell lung cancer.  She now presents with progressive weakness of her left lower extremity as well as left hand numbness and is having facial swelling although denies upper extremity swelling.  She was evaluated at [facility] found to have right upper lobe cavitary lesion with bulky lymphadenopathy, likely small pulmonary embolus and significant chest and upper extremity DVT.  She has had shortness of breath and wheezing particularly with lying flat which prompted initial EMS visit.  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  Past Medical History:  Diagnosis Date   Anxiety    Asthma    Blind    left eye   Cancer (HCC)    lung, tx radiation and chemo   Hyperlipemia    Hypertension    Stroke [facility])     Past Surgical History:  Procedure Laterality Date   CARDIAC CATHETERIZATION N/A 11/01/2014   Procedure: Left Heart Cath and Coronary Angiography;  Surgeon: Victory LELON Claudene, MD;  Location: [facility] INVASIVE CV LAB;  Service: Cardiovascular;  Laterality: N/A;   CESAREAN SECTION     x3   HERNIA REPAIR      Allergies  Allergen Reactions   Prednisone  Anaphylaxis    seizure   Nsaids Hives    Hives    Penicillins Hives        Bicillin C-R Hives    Prior to Admission medications   Medication Sig Start Date End Date Taking? Authorizing Provider  albuterol  (VENTOLIN  HFA) 108 (90 Base) MCG/ACT inhaler Inhale 2 puffs into the lungs every 4 (four) hours as needed for wheezing or shortness of breath. 10/14/23  Yes Ula Prentice SAUNDERS, MD  albuterol  (PROVENTIL ) (2.5 MG/3ML) 0.083% nebulizer solution Take 3  mLs (2.5 mg total) by nebulization every 6 (six) hours as needed for wheezing or shortness of breath. Patient not taking: Reported on 10/19/2023 10/14/23   Ula Prentice SAUNDERS, MD  butalbital -acetaminophen -caffeine  (FIORICET) 50-325-40 MG tablet Take 1-2 tablets by mouth every 6 (six) hours as needed for headache. Patient not taking: Reported on 10/19/2023 02/14/23 02/14/24  Juleen Rush, PA-C  rosuvastatin  (CRESTOR ) 40 MG tablet Take 1 tablet (40 mg total) by mouth daily. Patient not taking: Reported on 10/19/2023 01/07/22   Byrum, Robert S, MD  Vitamin D, Ergocalciferol, (DRISDOL) 1.25 MG (50000 UNIT) CAPS capsule Take 50,000 Units by mouth once a week. Patient not taking: Reported on 10/19/2023 02/09/23   [provider]    Social History   Socioeconomic History   Marital status: Single    Spouse name: Not on file   Number of children: 7   Years of education: Not on file   Highest education level: Some college, no degree  Occupational History   Not on file  Tobacco Use   Smoking status: Every Day    Current packs/day: 0.50    Types: Cigarettes   Smokeless tobacco: Never   Tobacco comments:    Pt stopped smoking 06/04/22 [facility]   Vaping Use   Vaping status: Never Used  Substance and Sexual Activity   Alcohol use: Not Currently    Comment: Sober x  1 month   Drug use: Not Currently    Types: Marijuana, Cocaine     Comment: none x approx 1 month   Sexual activity: Yes    Comment: 12/11/21 clean x 3 months  Other Topics Concern   Not on file  Social History Narrative   Lives with daughter   Social Drivers of Health   Financial Resource Strain: Medium Risk (10/05/2023)   Received from [facility]   Overall Financial Resource Strain (CARDIA)    How hard is it for you to pay for the very basics like food, housing, medical care, and heating?: Somewhat hard  Food Insecurity: No Food Insecurity (10/19/2023)   Hunger Vital Sign    Worried About Running Out of Food in the Last Year: Never  true    Ran Out of Food in the Last Year: Never true  Recent Concern: Food Insecurity - Food Insecurity Present (10/05/2023)   Received from [facility]   Hunger Vital Sign    Within the past 12 months, you worried that your food would run out before you got the money to buy more.: Sometimes true    Within the past 12 months, the food you bought just didn't last and you didn't have money to get more.: Sometimes true  Transportation Needs: No Transportation Needs (10/19/2023)   PRAPARE - Administrator, Civil Service (Medical): No    Lack of Transportation (Non-Medical): No  Physical Activity: Inactive (10/05/2023)   Received from [facility]   Exercise Vital Sign    On average, how many days per week do you engage in moderate to strenuous exercise (like a brisk walk)?: 0 days    Minutes of Exercise per Session: Not on file  Stress: Stress Concern Present (10/05/2023)   Received from [facility] of Occupational Health - Occupational Stress Questionnaire    Do you feel stress - tense, restless, nervous, or anxious, or unable to sleep at night because your mind is troubled all the time - these days?: Rather much  Social Connections: Somewhat Isolated (10/05/2023)   Received from [facility]   Social Network    How would you rate your social network (family, work, friends)?: Restricted participation with some degree of social isolation  Intimate Partner Violence: Not At Risk (10/19/2023)   Humiliation, Afraid, Rape, and Kick questionnaire    Fear of Current or Ex-Partner: No    Emotionally Abused: No    Physically Abused: No    Sexually Abused: No     Family History  Problem Relation Age of Onset   Clotting disorder Mother     Review of Systems  Constitutional:  Positive for malaise/fatigue.  HENT: Negative.    Respiratory:  Positive for cough, hemoptysis, shortness of breath and wheezing.   Gastrointestinal: Negative.   Musculoskeletal: Negative.    Skin: Negative.   Neurological:  Positive for sensory change and focal weakness.  Endo/Heme/Allergies: Negative.   Psychiatric/Behavioral: Negative.        Physical Examination  Vitals:   10/19/23 1349 10/19/23 1548  BP: 123/88   Pulse: (!) 109 100  Resp: 20 16  Temp: 98.5 F (36.9 C)   SpO2: 100%    Body mass index is 35.99 kg/m.  Physical Exam HENT:     Head: Normocephalic.  Cardiovascular:     Rate and Rhythm: Normal rate.  Abdominal:     Palpations: Abdomen is soft.  Musculoskeletal:     Right lower leg:  No edema.     Left lower leg: No edema.  Skin:    General: Skin is warm and dry.  Neurological:     Mental Status: She is alert.      CBC    Component Value Date/Time   WBC 7.0 10/18/2023 1630   WBC 7.1 10/18/2023 1630   RBC 3.90 10/18/2023 1630   RBC 3.92 10/18/2023 1630   HGB 10.8 (L) 10/18/2023 1630   HGB 10.8 (L) 10/18/2023 1630   HCT 33.8 (L) 10/18/2023 1630   HCT 34.3 (L) 10/18/2023 1630   PLT 170 10/18/2023 1630   PLT 187 10/18/2023 1630   MCV 86.7 10/18/2023 1630   MCV 87.5 10/18/2023 1630   MCH 27.7 10/18/2023 1630   MCH 27.6 10/18/2023 1630   MCHC 32.0 10/18/2023 1630   MCHC 31.5 10/18/2023 1630   RDW 14.1 10/18/2023 1630   RDW 14.1 10/18/2023 1630   LYMPHSABS 1.0 10/18/2023 1630   MONOABS 0.6 10/18/2023 1630   EOSABS 0.1 10/18/2023 1630   BASOSABS 0.0 10/18/2023 1630    BMET    Component Value Date/Time   NA 136 10/18/2023 1630   K 3.8 10/18/2023 1630   CL 100 10/18/2023 1630   CO2 24 10/18/2023 1630   GLUCOSE 105 (H) 10/18/2023 1630   BUN 14 10/18/2023 1630   CREATININE 0.71 10/18/2023 1630   CREATININE 0.72 04/03/2021 0000   CALCIUM  9.6 10/18/2023 1630   GFRNONAA >60 10/18/2023 1630   GFRAA >60 08/04/2018 1304    COAGS: Lab Results  Component Value Date   INR 1.0 11/10/2021   INR 1.07 11/01/2014     Non-Invasive Vascular Imaging:   CTA IMPRESSION: 1. Limited evaluation of the segmental and subsegmental  pulmonary arteries secondary to suboptimal opacification with intravenous contrast. As result, pulmonary embolism within the segmental branches of the right lower lobe cannot be excluded. 2. Occlusive thrombus within the brachiocephalic vein, nonocclusive thrombus within the superior vena cava and azygous vein, and extensive, nonocclusive thrombus within the right axillary vein and right subclavian vein. 3. 4.4 cm x 4.8 cm x 4.6 cm thick walled cavitary lesion within the posterior aspect of the right upper lobe which is seen on the prior study and may be consistent with the patient's history of lung cancer. 4. Mild right hilar lymphadenopathy. 5. Small hiatal hernia. 6. Hepatic steatosis. 7. Colonic diverticulosis. 8. Aortic atherosclerosis.     ASSESSMENT/PLAN: This is a 58 y.o. 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  Given the extent of DVT with lymphadenopathy encasing the SVC as well as her lack of symptoms I would not recommend any intervention at this time and hopefully her symptoms will remain mild with heparin .  Heparin  will need to be paused for biopsy this was discussed with the family at bedside.  Vascular surgery will be available as needed.  Olaf Mesa C. Sheree, MD [facility] Office: [phone] Pager: [phone]

## 2023-10-19 NOTE — Consult Note (Signed)
 NEUROLOGY CONSULT NOTE   Date of service: October 19, 2023 Patient Name: Andrea Mullen MRN:  [id number] DOB:  05/04/1965 Chief Complaint: Sinus venous stenosis Requesting Provider: Claudene Maximino LABOR, MD  History of Present Illness  Andrea Mullen is a 57 y.o. 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  She had workup which demonstrated thrombus extending from distal half of the right transverse sinus, right sigmoid sinus and into the right internal jugular and extending into brachiocephalic vein, and a nonocclusive thrombus in the superior vena cava.  Patient was started on heparin  drip and neurology was consulted for further evaluation and workup.  LKW: A month ago Modified rankin score: 2-Slight disability-UNABLE to perform all activities but does not need assistance IV Thrombolysis: Not offered, outside window.   EVT: Not offered, no LVO.  NIHSS components Score: Comment  1a Level of Conscious 0[x]  1[]  2[]  3[]      1b LOC Questions 0[x]  1[]  2[]       1c LOC Commands 0[x]  1[]  2[]       2 Best Gaze 0[x]  1[]  2[]       3 Visual 0[x]  1[]  2[]  3[]      4 Facial Palsy 0[x]  1[]  2[]  3[]      5a Motor Arm - left 0[x]  1[]  2[]  3[]  4[]  UN[]    5b Motor Arm - Right 0[]  1[]  2[]  3[]  4[]  UN[]    6a Motor Leg - Left 0[]  1[x]  2[]  3[]  4[]  UN[]    6b Motor Leg - Right 0[]  1[x]  2[]  3[]  4[]  UN[]    7 Limb Ataxia 0[x]  1[]  2[]  UN[]      8 Sensory 0[x]  1[]  2[]  UN[]      9 Best Language 0[x]  1[]  2[]  3[]      10 Dysarthria 0[x]  1[]  2[]  UN[]      11 Extinct. and Inattention 0[x]  1[]  2[]       TOTAL: 2      ROS  Comprehensive ROS performed and pertinent positives documented in HPI   Past History   Past Medical History:  Diagnosis Date   Anxiety    Asthma    Blind    left eye    Cancer (HCC)    lung, tx radiation and chemo   Hyperlipemia    Hypertension    Stroke [facility])     Past Surgical History:  Procedure Laterality Date   CARDIAC CATHETERIZATION N/A 11/01/2014   Procedure: Left Heart Cath and Coronary Angiography;  Surgeon: Victory LELON Claudene, MD;  Location: [facility] INVASIVE CV LAB;  Service: Cardiovascular;  Laterality: N/A;   CESAREAN SECTION     x3   HERNIA REPAIR      Family History: Family History  Problem Relation Age of Onset   Clotting disorder Mother     Social History  reports that she has been smoking cigarettes. She has never used smokeless tobacco. She reports that she does not currently use alcohol. She reports that she does not currently use drugs after having used the following drugs: Marijuana and Cocaine .  Allergies  Allergen Reactions   Prednisone  Anaphylaxis    seizure   Nsaids Hives    Hives    Penicillins Hives        Bicillin C-R Hives    Medications   Current Facility-Administered Medications:    acetaminophen  (  TYLENOL ) tablet 650 mg, 650 mg, Oral, Q6H PRN **OR** acetaminophen  (TYLENOL ) suppository 650 mg, 650 mg, Rectal, Q6H PRN, Smith, Rondell A, MD   albuterol  (PROVENTIL ) (2.5 MG/3ML) 0.083% nebulizer solution 2.5 mg, 2.5 mg, Nebulization, Q2H PRN, Smith, Rondell A, MD   heparin  ADULT infusion 100 units/mL (25000 units/250mL), 1,250 Units/hr, Intravenous, Continuous, Clair Lynwood CROME, RPH, Last Rate: 12.5 mL/hr at 10/19/23 1747, 1,250 Units/hr at 10/19/23 1747   HYDROcodone -acetaminophen  (NORCO/VICODIN) 5-325 MG per tablet 1 tablet, 1 tablet, Oral, Q6H PRN, Smith, Rondell A, MD   sodium chloride  flush (NS) 0.9 % injection 3 mL, 3 mL, Intravenous, Q12H, Smith, Rondell A, MD  Vitals   Vitals:   10/19/23 1349 10/19/23 1456 10/19/23 1548 10/19/23 1734  BP: 123/88   103/72  Pulse: (!) 109  100 (!) 111  Resp: 20  16 14   Temp: 98.5 F (36.9 C)   98.7 F (37.1 C)  TempSrc: Oral   Oral  SpO2: 100%   90%  Weight:  98.1 kg     Height:  5' 5 (1.651 m)      Body mass index is 35.99 kg/m.   Physical Exam   General: Laying comfortably in bed; in no acute distress.  HENT: Normal oropharynx and mucosa. Normal external appearance of ears and nose.  Neck: Supple, no pain or tenderness  CV: No JVD. No peripheral edema.  Pulmonary: Symmetric Chest rise. Normal respiratory effort.  Dyspnea with slight exertion. Abdomen: Soft to touch, non-tender.  Ext: No cyanosis, edema, or deformity  Skin: No rash. Normal palpation of skin.   Musculoskeletal: Normal digits and nails by inspection. No clubbing.   Neurologic Examination  Mental status/Cognition: Alert, oriented to self, place, month.  Thinks this is 2024. Good attention.  Speech/language: Fluent, comprehension intact, object naming intact, repetition intact.  Cranial nerves:   CN II L Pupil is round but unreactive to light, R pupil is round and reactive to light. no VF deficits    CN III,IV,VI EOM intact, no gaze preference or deviation, no nystagmus    CN V normal sensation in V1, V2, and V3 segments bilaterally    CN VII no asymmetry, no nasolabial fold flattening    CN VIII normal hearing to speech    CN IX & X normal palatal elevation, no uvular deviation    CN XI 5/5 head turn and 5/5 shoulder shrug bilaterally    CN XII midline tongue protrusion    Motor:  Muscle bulk: Normal, tone normal Mvmt Root Nerve  Muscle Right Left Comments  SA C5/6 Ax Deltoid 5 5   EF C5/6 Mc Biceps 5 5   EE C6/7/8 Rad Triceps 5 5   WF C6/7 Med FCR     WE C7/8 PIN ECU     F Ab C8/T1 U ADM/FDI 5 5   HF L1/2/3 Fem Illopsoas 4 4 Bilateral hip flexion weakness and this may just be positional as she is sitting up to catch her breath and unable to recline without feeling uncomfortable.  KE L2/3/4 Fem Quad 5 5   DF L4/5 D Peron Tib Ant 5 5   PF S1/2 Tibial Grc/Sol 5 5    Sensation:  Light touch Intact throughout   Pin prick    Temperature    Vibration   Proprioception     Coordination/Complex Motor:  - Finger to Nose intact bilaterally - Heel to shin unable to do due to body habitus. - Rapid alternating movement slightly slowed on  the left compared to the right. - Gait: Deferred for patient safety. Labs/Imaging/Neurodiagnostic studies   CBC:  Recent Labs  Lab 11/11/23 0830 10/18/23 1630  WBC 7.4 7.1  7.0  NEUTROABS 5.7 5.4  HGB 11.7* 10.8*  10.8*  HCT 37.6 34.3*  33.8*  MCV 88.7 87.5  86.7  PLT 167 187  170   Basic Metabolic Panel:  Lab Results  Component Value Date   NA 136 10/18/2023   K 3.8 10/18/2023   CO2 24 10/18/2023   GLUCOSE 105 (H) 10/18/2023   BUN 14 10/18/2023   CREATININE 0.71 10/18/2023   CALCIUM  9.6 10/18/2023   GFRNONAA >60 10/18/2023   GFRAA >60 08/04/2018   Lipid Panel:  Lab Results  Component Value Date   LDLCALC 182 (H) 11/08/2021   HgbA1c:  Lab Results  Component Value Date   HGBA1C 5.3 11/08/2021   Urine Drug Screen:     Component Value Date/Time   LABOPIA NONE DETECTED 11/08/2021 1238   COCAINSCRNUR NONE DETECTED 11/08/2021 1238   COCAINSCRNUR POSITIVE (A) 06/01/2011 1231   LABBENZ NONE DETECTED 11/08/2021 1238   LABBENZ NEGATIVE 06/01/2011 1231   AMPHETMU NONE DETECTED 11/08/2021 1238   THCU NONE DETECTED 11/08/2021 1238   LABBARB NONE DETECTED 11/08/2021 1238    Alcohol Level     Component Value Date/Time   ETH <10 11/08/2021 1238   INR  Lab Results  Component Value Date   INR 1.0 11/10/2021   APTT  Lab Results  Component Value Date   APTT 26 11/10/2021   AED levels: No results found for: PHENYTOIN, ZONISAMIDE, LAMOTRIGINE, LEVETIRACETA  CT Head without contrast(Personally reviewed): CTH was negative for a large hypodensity concerning for a large territory infarct or hyperdensity concerning for an ICH  CT venogram head with contrast(Personally reviewed): 1. Stable Thrombosis of the right IJ, right sigmoid sinus, and lateral half of the right transverse dural venous  sinus since the CT Head and Neck yesterday.   2. No other intracranial venous thrombosis identified. No intracranial mass effect, hemorrhage, or acute cerebral edema identified.  MRI Brain with and without contrast: Pending  ASSESSMENT   Andrea Mullen is a 58 y.o. 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  She was found to have extensive thrombus extending from distal half of the right transverse sinus down through this entire sigmoid sinus, right internal jugular vein, brachiocephalic vein and nonocclusive in the SVC.  I am not sure if she has SVC syndrome.  I did bring up my concerns with the hospitalist team and they will reach out to the vascular surgery to evaluate.  RECOMMENDATIONS  - Recommend heparin  neuro scale with goal heparin  levels between 0.3-0.5. - Stroke team to follow. ______________________________________________________________________  Plan discussed with Dr. Claudene with the hospitalist team.  Signed, Ingeborg Fite, MD [facility]

## 2023-10-19 NOTE — H&P (Addendum)
 History and Physical    Patient: Andrea Mullen FMW:[id number] DOB: 08/28/1965 DOA: 10/18/2023 DOS: the patient was seen and examined on 10/19/2023 PCP: [facility]  Patient coming from: Transfer from [facility] Chief Complaint:  Chief Complaint  Patient presents with   Chest Pain   HPI: TAURI ETHINGTON is a 58 y.o. female with medical history significant of hypertension, hyperlipidemia, lung cancer s/p radiation and chemo, blind in left eye who presented with complaints of centralized chest pain and increased shortness of breath this morning at around 6:30 AM.   She reports having difficulty ambulating due to left lower extremity weakness over the last month.  Records note she was seen in the ED on 8/11 after having a fall where she had reported issues with left-sided weakness/numbness at that time.  She was worked up and had negative CT scan of the head and ultimately discharged home.  She developed left hand numbness about 2 days ago.   Patient noted that she has had a productive cough as well as some intermittent left-sided facial swelling.  Due to symptoms of progressively worsening shortness of breath she had been seen in the ED and had a CT angiogram of the chest on 9/4 where she was found to have cavitary right upper lobe mass with bulky mediastinal lymphadenopathy concerning for primary lung malignancy.  Family notes she was referred to oncology at the cancer center on September 15.  She does not normally get headaches.   This morning she woke up reported having a severe splitting posterior headache in addition to worsening shortness of breath and chest pain which she came to the hospital for further evaluation.   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  In the emergency department patient was noted to be afebrile with heart rates  elevated up to 116, respirations 15-30, and all other vital signs maintained.  Labs noted hemoglobin 10.8 and high-sensitivity troponin 18.  CT scan of the head with contrast revealed filling defect within the right transverse and sigmoid sinus extending the right jugular bulb and dural venous sinus thrombosis.  CT scan of the chest revealed pulmonary embolism thrombus within the brachiocephalic vein and occlusive thrombus within the right subclavian vein a 4.4 x 4.8 x 4.6 cm thick-walled cavitary lesion in the posterior aspect of the right upper lobe consistent with patient's known lung cancer.  Review of Systems: As mentioned in the history of present illness. All other systems reviewed and are negative. Past Medical History:  Diagnosis Date   Anxiety    Asthma    Blind    left eye   Cancer (HCC)    lung, tx radiation and chemo   Hyperlipemia    Hypertension    Stroke [facility])    Past Surgical History:  Procedure Laterality Date   CARDIAC CATHETERIZATION N/A 11/01/2014   Procedure: Left Heart Cath and Coronary Angiography;  Surgeon: Victory LELON Sharps, MD;  Location: [facility] INVASIVE CV LAB;  Service: Cardiovascular;  Laterality: N/A;   CESAREAN SECTION     x3   HERNIA REPAIR     Social History:  reports that she has been smoking cigarettes. She has never used smokeless tobacco. She reports that she does not currently use alcohol. She reports that she does not currently use drugs after having used the following drugs: Marijuana and Cocaine .  Allergies  Allergen  Reactions   Prednisone  Anaphylaxis    seizure   Nsaids Hives    Hives    Penicillins Hives        Bicillin C-R Hives    Family History  Problem Relation Age of Onset   Clotting disorder Mother     Prior to Admission medications   Medication Sig Start Date End Date Taking? Authorizing Provider  albuterol  (PROVENTIL ) (2.5 MG/3ML) 0.083% nebulizer solution Take 3 mLs (2.5 mg total) by nebulization every 6 (six) hours as needed  for wheezing or shortness of breath. 10/14/23   Ula Prentice SAUNDERS, MD  albuterol  (VENTOLIN  HFA) 108 (90 Base) MCG/ACT inhaler Inhale 2 puffs into the lungs every 4 (four) hours as needed for wheezing or shortness of breath. 10/14/23   Ula Prentice SAUNDERS, MD  butalbital -acetaminophen -caffeine  (FIORICET) 50-325-40 MG tablet Take 1-2 tablets by mouth every 6 (six) hours as needed for headache. 02/14/23 02/14/24  Juleen Rush, PA-C  rosuvastatin  (CRESTOR ) 40 MG tablet Take 1 tablet (40 mg total) by mouth daily. 01/07/22   Byrum, Robert S, MD  Vitamin D, Ergocalciferol, (DRISDOL) 1.25 MG (50000 UNIT) CAPS capsule Take 50,000 Units by mouth once a week. 02/09/23   [provider]    Physical Exam: Vitals:   10/19/23 1205 10/19/23 1250 10/19/23 1255 10/19/23 1349  BP: 103/76  118/80 123/88  Pulse: (!) 101  (!) 108 (!) 109  Resp: (!) 23 (!) 29  20  Temp: 98.9 F (37.2 C)   98.5 F (36.9 C)  TempSrc: Oral   Oral  SpO2: 97%  100% 100%   Constitutional: Elderly female who appears to be in some respiratory distress Eyes: PERRL, lids and conjunctivae normal ENMT: Mucous membranes are moist.  Slightly hard of hearing Neck: normal, supple, no JVD Respiratory: Decreased aeration without significant wheezes or rhonchi appreciated Cardiovascular: Regular rate and rhythm healed median sternotomy scar.  No extremity edema.  Abdomen: no tenderness, no masses palpated.   Bowel sounds positive.  Musculoskeletal: no clubbing / cyanosis.  Skin: no rashes, lesions, ulcers. No induration Neurologic: CN 2-12 grossly intact.  Sensation reported to be abnormal on the left.  Strength 4/5 on the left. Psychiatric: Normal judgment and insight. Alert and oriented x 3. Normal mood.   Data Reviewed:   EKG reveals sinus tachycardia at 114.  Reviewed labs, imaging, and pertinent records as documented.  Assessment and Plan:   Acute respiratory distress Brachiocephalic vein thrombosis SVC, azygous, right axillary, and  right subclavian vein thrombosis Patient presented with centralized chest pain and worsening shortness of breath.  O2 saturations currently maintained on room air.  Reports of intermittent swelling of the left side of the face with left arm numbness.  Found to have segmental pulmonary emboli as well as occlusive thrombus within the brachiocephalic vein, nonocclusive thrombus in SVC, azygous, and extensive nonocclusive thormbus within the right axillary and right subclavian vein.  Multiple clots thought related to right lung mass concerning for malignancy. - Admit to a progressive bed - Continuous pulse oximetry with oxygen maintain O2 saturations greater than 90 - Incentive spirometry and flutter valve - Continue heparin  drip per pharmacy - Check echocardiogram - Check Doppler ultrasound of lower extremities - Breathing treatments as needed - Hydrocodone  as needed for pain  Dural sinus thrombosis Acute.  Patient complained of splitting posterior headache with reports of left hand numbness and left leg weakness.  CT of the head neck with contrast was obtained which revealed concerns for dural sinus thrombosis. -  Neurocheck - Check MRI with and without contrast of the brain  - PT to evaluate and treat - Neurology consulted, follow-up for any further recommendations  Cavitating mass in the right upper lung lobe  History of small cell lung cancer Patient with a prior history of cell carcinoma of the right middle lung lung back in 2007 status post chemo and radiation treatment.  CT imaging revealed a cavitary right upper lung lesion on 9/4.  Patient was scheduled to have follow-up on September 15 with oncology at the cancer center. - PCCM consulted to evaluate  - IR consulted for possible biopsy during hospital stay  Left leg weakness Patient reported that she had not been able to ambulate due to weakness in her left leg. - Follow-up MRI of the brain   - PT to eval and treat  Normocytic  anemia Acute.  Hemoglobin 10.8, but had been 11.7 when checked on 9/4.SABRA  Patient did not report any complaints of bleeding. - Continue to monitor while on anticoagulation  DVT prophylaxis: Heparin  Advance Care Planning:   Code Status: Full Code    Consults: PCCM, neurology, vascular surgery, IR  Family Communication: Family updated at bedside  Severity of Illness: The appropriate patient status for this patient is INPATIENT. Inpatient status is judged to be reasonable and necessary in order to provide the required intensity of service to ensure the patient's safety. The patient's presenting symptoms, physical exam findings, and initial radiographic and laboratory data in the context of their chronic comorbidities is felt to place them at high risk for further clinical deterioration. Furthermore, it is not anticipated that the patient will be medically stable for discharge from the hospital within 2 midnights of admission.   * I certify that at the point of admission it is my clinical judgment that the patient will require inpatient hospital care spanning beyond 2 midnights from the point of admission due to high intensity of service, high risk for further deterioration and high frequency of surveillance required.*  Author: Maximino DELENA Sharps, MD 10/19/2023 2:40 PM  For on call review [email or web address].

## 2023-10-19 NOTE — Progress Notes (Signed)
 PHARMACY - ANTICOAGULATION CONSULT NOTE  Pharmacy Consult for heparin  Indication: Dural venous thrombus  Allergies  Allergen Reactions   Prednisone  Anaphylaxis    seizure   Bicillin C-R Hives   Ibuprofen  Nausea Only   Nsaids Other (See Comments)    Hives    Penicillins Hives    Has patient had a PCN reaction causing immediate rash, facial/tongue/throat swelling, SOB or lightheadedness with hypotension: No Has patient had a PCN reaction causing severe rash involving mucus membranes or skin necrosis: Yes Has patient had a PCN reaction that required hospitalization No Has patient had a PCN reaction occurring within the last 10 years: Yes  If all of the above answers are NO, then may proceed with Cephalosporin use.     Patient Measurements:    Vital Signs: Temp: 98.2 F (36.8 C) (09/09 0300) Temp Source: Oral (09/09 0300) BP: 96/75 (09/09 0300) Pulse Rate: 93 (09/09 0300)  Labs: Recent Labs    10/18/23 1630 10/19/23 0318  HGB 10.8*  10.8*  --   HCT 34.3*  33.8*  --   PLT 187  170  --   HEPARINUNFRC  --  0.27*  CREATININE 0.71  --     CrCl cannot be calculated (Unknown ideal weight.).   Medical History: Past Medical History:  Diagnosis Date   Anxiety    Asthma    Blind    left eye   Cancer (HCC)    lung, tx radiation and chemo   Hyperlipemia    Hypertension    Stroke [facility])      Assessment: 19 YOF presenting with chest tightness and neurologic sx, CT head shows dural venous sinus thrombosis, also thrombus in brachiocephalic vein extending to multiple areas.  She is not on anticoagulation PTA  9/9 AM update:  Heparin  level sub-therapeutic   Goal of Therapy:  Heparin  level 0.3-0.5 units/ml Monitor platelets by anticoagulation protocol: Yes   Plan:  Inc heparin  to 1250 units/hr Heparin  level in 8 hours  Lynwood Mckusick, PharmD, BCPS Clinical Pharmacist Phone: [phone]

## 2023-10-19 NOTE — ED Provider Notes (Signed)
  Physical Exam  BP 113/67   Pulse (!) 111   Temp 98.2 F (36.8 C) (Oral)   Resp (!) 30   LMP 11/16/2016   SpO2 98%   Physical Exam  Procedures  Procedures  ED Course / MDM    Medical Decision Making Amount and/or Complexity of Data Reviewed Labs: ordered. Radiology: ordered.  Risk OTC drugs. Prescription drug management. Decision regarding hospitalization.   I evaluated patient on rounds this morning.  She is currently on a heparin  gtt., awaiting admission at [facility] due to concern for an SVC and IJ thrombus.  She was also found to have a dural venous sinus thrombosis.  On rounds this morning, the patient is vitally stable, mildly tachycardic heart rate 111, hemodynamically stable BP 113/67.  Patient denies any chest pain, denies any shortness of breath, denies any headache.  Inform patient of wait due to lacko of bed availability, all questions answered.  Vascular surgery consulted given pt findings on CT: Spoke with Dr. Sheree who agreed with plan for continuing anticoagulation, no emergent vascular intervention. Pt subsequently admitted in stable condition.      Jerrol Agent, MD 10/19/23 1125

## 2023-10-20 ENCOUNTER — Inpatient Hospital Stay (HOSPITAL_COMMUNITY)

## 2023-10-20 DIAGNOSIS — C801 Malignant (primary) neoplasm, unspecified: Secondary | ICD-10-CM

## 2023-10-20 DIAGNOSIS — I82A11 Acute embolism and thrombosis of right axillary vein: Secondary | ICD-10-CM | POA: Diagnosis not present

## 2023-10-20 DIAGNOSIS — I6350 Cerebral infarction due to unspecified occlusion or stenosis of unspecified cerebral artery: Secondary | ICD-10-CM | POA: Diagnosis not present

## 2023-10-20 DIAGNOSIS — R29702 NIHSS score 2: Secondary | ICD-10-CM | POA: Diagnosis not present

## 2023-10-20 DIAGNOSIS — F17210 Nicotine dependence, cigarettes, uncomplicated: Secondary | ICD-10-CM

## 2023-10-20 DIAGNOSIS — I2699 Other pulmonary embolism without acute cor pulmonale: Secondary | ICD-10-CM | POA: Diagnosis not present

## 2023-10-20 DIAGNOSIS — R079 Chest pain, unspecified: Secondary | ICD-10-CM | POA: Diagnosis not present

## 2023-10-20 DIAGNOSIS — I82290 Acute embolism and thrombosis of other thoracic veins: Secondary | ICD-10-CM | POA: Diagnosis not present

## 2023-10-20 DIAGNOSIS — E785 Hyperlipidemia, unspecified: Secondary | ICD-10-CM

## 2023-10-20 DIAGNOSIS — D6869 Other thrombophilia: Secondary | ICD-10-CM

## 2023-10-20 DIAGNOSIS — I82533 Chronic embolism and thrombosis of popliteal vein, bilateral: Secondary | ICD-10-CM

## 2023-10-20 DIAGNOSIS — G08 Intracranial and intraspinal phlebitis and thrombophlebitis: Secondary | ICD-10-CM | POA: Diagnosis not present

## 2023-10-20 DIAGNOSIS — I82B11 Acute embolism and thrombosis of right subclavian vein: Secondary | ICD-10-CM | POA: Diagnosis not present

## 2023-10-20 DIAGNOSIS — Z86711 Personal history of pulmonary embolism: Secondary | ICD-10-CM

## 2023-10-20 DIAGNOSIS — I739 Peripheral vascular disease, unspecified: Secondary | ICD-10-CM

## 2023-10-20 DIAGNOSIS — I69391 Dysphagia following cerebral infarction: Secondary | ICD-10-CM

## 2023-10-20 LAB — BASIC METABOLIC PANEL WITH GFR
Anion gap: 9 (ref 5–15)
BUN: 10 mg/dL (ref 6–20)
CO2: 23 mmol/L (ref 22–32)
Calcium: 8.9 mg/dL (ref 8.9–10.3)
Chloride: 102 mmol/L (ref 98–111)
Creatinine, Ser: 0.67 mg/dL (ref 0.44–1.00)
GFR, Estimated: >60 mL/min (ref >60)
Glucose, Bld: 95 mg/dL (ref 70–99)
Potassium: 3.9 mmol/L (ref 3.5–5.1)
Sodium: 134 mmol/L — ABNORMAL LOW (ref 135–145)

## 2023-10-20 LAB — RAPID URINE DRUG SCREEN, HOSP PERFORMED
Amphetamines: NOT DETECTED
Barbiturates: NOT DETECTED
Benzodiazepines: NOT DETECTED
Cocaine: NOT DETECTED
Opiates: POSITIVE — AB
Tetrahydrocannabinol: NOT DETECTED

## 2023-10-20 LAB — CBC
HCT: 31.3 % — ABNORMAL LOW (ref 36.0–46.0)
Hemoglobin: 10 g/dL — ABNORMAL LOW (ref 12.0–15.0)
MCH: 27.7 pg (ref 26.0–34.0)
MCHC: 31.9 g/dL (ref 30.0–36.0)
MCV: 86.7 fL (ref 80.0–100.0)
Platelets: 160 K/uL (ref 150–400)
RBC: 3.61 MIL/uL — ABNORMAL LOW (ref 3.87–5.11)
RDW: 14.2 % (ref 11.5–15.5)
WBC: 5.1 K/uL (ref 4.0–10.5)
nRBC: 0 % (ref 0.0–0.2)

## 2023-10-20 LAB — HEPARIN LEVEL (UNFRACTIONATED): Heparin Unfractionated: 0.39 [IU]/mL (ref 0.30–0.70)

## 2023-10-20 LAB — ECHOCARDIOGRAM COMPLETE
Area-P 1/2: 4.39 cm2
Height: 65 in
S' Lateral: 4.6 cm
Weight: 3460.34 [oz_av]

## 2023-10-20 MED ORDER — STROKE: EARLY STAGES OF RECOVERY BOOK
Freq: Once | Status: AC
  Filled 2023-10-20: qty 1

## 2023-10-20 NOTE — Progress Notes (Signed)
  Echocardiogram 2D Echocardiogram has been performed.  Andrea Mullen 10/20/2023, 5:19 PM

## 2023-10-20 NOTE — Progress Notes (Signed)
 PHARMACY - ANTICOAGULATION CONSULT NOTE  Pharmacy Consult for heparin  Indication: Dural venous thrombus  Allergies  Allergen Reactions   Bicillin C-R Hives   Penicillins Hives        Prednisone  Anaphylaxis    seizure   Nsaids Hives    Hives     Patient Measurements: Height: 5' 5 (165.1 cm) Weight: 98.1 kg (216 lb 4.3 oz) IBW/kg (Calculated) : 57 HEPARIN  DW (KG): 79.3  Vital Signs: Temp: 98.5 F (36.9 C) (09/10 0357) Temp Source: Oral (09/10 0357) BP: 98/72 (09/10 0400) Pulse Rate: 88 (09/10 0400)  Labs: Recent Labs    10/18/23 1630 10/19/23 0318 10/19/23 1806 10/19/23 1808 10/20/23 0246  HGB 10.8*  10.8*  --   --   --  10.0*  HCT 34.3*  33.8*  --   --   --  31.3*  PLT 187  170  --   --   --  160  HEPARINUNFRC  --    < > 0.45 0.42 0.39  CREATININE 0.71  --   --   --  0.67   < > = values in this interval not displayed.    Estimated Creatinine Clearance: 91 mL/min (by C-G formula based on SCr of 0.67 mg/dL).   Medical History: Past Medical History:  Diagnosis Date   Anxiety    Asthma    Blind    left eye   Cancer (HCC)    lung, tx radiation and chemo   Hyperlipemia    Hypertension    Stroke [facility])      Assessment: 3 YOF presenting with chest tightness and neurologic sx, CT head shows dural venous sinus thrombosis, also thrombus in brachiocephalic vein extending to multiple areas.  She is not on anticoagulation PTA  Heparin  level remains therapeutic at 0.39 this am, CBC stable.  Goal of Therapy:  Heparin  level 0.3-0.5 units/ml Monitor platelets by anticoagulation protocol: Yes   Plan:  -Continue heparin  1250 units/hr -Daily heparin  level and CBC  Ozell Jamaica, PharmD, BCPS, [facility] Clinical Pharmacist [phone] Please check AMION for all [facility] numbers 10/20/2023

## 2023-10-20 NOTE — Evaluation (Signed)
 Physical Therapy Evaluation Patient Details Name: Andrea Mullen MRN: [id number] DOB: March 11, 1965 Today's Date: 10/20/2023  History of Present Illness  Pt is 58 yo presenting to [facility] on 9/8 due to 1 month of LLE weakness and 3 day of mild LUE weakness with facial swelling and shortness of breath. Findings of thrombus in the distal half of the R transverse sinus, R sigmoid sinus and into the R internal jugular and extending into the brachiocephalic vein and nonocclusive thrombus in the superior vena cava. PMH: small cell lung cancer s/p radiation/chemothearpy, L eye CRAO, asthma, hyperlipidemia, HTN, CVA  Clinical Impression  Pt is presenting currently a Min A for bed mobility, sit to stand and short in-home distance gait. Pt requires physical assistance for cueing in order to initiate functional movements. Daughter present in room and states she can assist pt at current level of functioning. Due to pt current functional status, home set up and available assistance at home recommending skilled physical therapy services 3x/week in order to address strength, balance and functional mobility to decrease risk for falls, injury and re-hospitalization.           If plan is discharge home, recommend the following: A little help with walking and/or transfers;Help with stairs or ramp for entrance;Assist for transportation;Assistance with cooking/housework;Supervision due to cognitive status     Equipment Recommendations None recommended by PT     Functional Status Assessment Patient has had a recent decline in their functional status and demonstrates the ability to make significant improvements in function in a reasonable and predictable amount of time.     Precautions / Restrictions Precautions Precautions: Fall Recall of Precautions/Restrictions: Impaired Restrictions Weight Bearing Restrictions Per Provider Order: No      Mobility  Bed Mobility Overal bed mobility: Needs Assistance Bed  Mobility: Supine to Sit     Supine to sit: Min assist     General bed mobility comments: Min a for trunk to mid line and LE to EOB pt requires physical assist to initiate movement to EOB    Transfers Overall transfer level: Needs assistance Equipment used: Rolling walker (2 wheels) Transfers: Sit to/from Stand, Bed to chair/wheelchair/BSC Sit to Stand: Min assist   Step pivot transfers: Min assist       General transfer comment: Min A with verbal cues for body mechanics, hand placement and rocking with counting for initial momentum to get to standing from sitting. Pt requires Min A to stabilize once in standing adn Min A to navigate RW as well as wgt shifting for stepping and larger steppings with step by step cues for sequencing to get to chair with RW.    Ambulation/Gait Ambulation/Gait assistance: Min assist Gait Distance (Feet): 60 Feet Assistive device: Rolling walker (2 wheels) Gait Pattern/deviations: Festinating, Shuffle, Narrow base of support, Step-through pattern, Decreased step length - right, Decreased step length - left Gait velocity: decreased Gait velocity interpretation: <1.31 ft/sec, indicative of household ambulator   General Gait Details: pt with festinating, shuffling gait pattern that improves significantly with constant verbal cues for big steps R/L and Min A wiht wgt shifting. Pt has difficulty progressing her RLE far enough forward but continues to progress the LLE due to poor WB on the L requiring consistent muldi modal cues for safe ambulation.    Balance Overall balance assessment: Needs assistance Sitting-balance support: Bilateral upper extremity supported Sitting balance-Leahy Scale: Fair     Standing balance support: Bilateral upper extremity supported, During functional activity, Reliant on assistive device  for balance Standing balance-Leahy Scale: Poor Standing balance comment: requires Min A for balance.         Pertinent Vitals/Pain  Pain Assessment Pain Assessment: Faces Faces Pain Scale: Hurts a little bit Pain Location: R side Pain Descriptors / Indicators: Aching, Discomfort, Grimacing Pain Intervention(s): Limited activity within patient's tolerance, Monitored during session    Home Living Family/patient expects to be discharged to:: Private residence Living Arrangements: Children Available Help at Discharge: Family;Available 24 hours/day Type of Home: House Home Access: Ramped entrance       Home Layout: One level Home Equipment: Rollator (4 wheels)      Prior Function Prior Level of Function : Needs assist       Physical Assist : Mobility (physical);ADLs (physical) Mobility (physical): Transfers;Gait;Bed mobility ADLs (physical): Bathing;Dressing;Toileting;IADLs Mobility Comments: pt uses Rollator at home. ADLs Comments: Family assists with ADL's as needed.     Extremity/Trunk Assessment   Upper Extremity Assessment Upper Extremity Assessment: Defer to OT evaluation    Lower Extremity Assessment Lower Extremity Assessment: Generalized weakness;LLE deficits/detail LLE Deficits / Details: LLE appears to be weaker than R with decreased stance time though manually tests about the same.    Cervical / Trunk Assessment Cervical / Trunk Assessment: Normal  Communication   Communication Communication: Impaired Factors Affecting Communication: Difficulty expressing self    Cognition Arousal: Alert Behavior During Therapy: Restless   PT - Cognitive impairments: Initiation, Sequencing, Problem solving, Safety/Judgement     PT - Cognition Comments: Pt requires physical assistance for initiation. Verbal cues throughout for sequencing and body mechanics. Following commands: Impaired Following commands impaired: Follows one step commands inconsistently, Follows one step commands with increased time     Cueing Cueing Techniques: Verbal cues, Tactile cues, Visual cues     General Comments  General comments (skin integrity, edema, etc.): Daughter present and supportive throughout session        Assessment/Plan    PT Assessment Patient needs continued PT services  PT Problem List Decreased strength;Decreased activity tolerance;Decreased balance;Decreased mobility;Decreased safety awareness       PT Treatment Interventions DME instruction;Balance training;Gait training;Functional mobility training;Therapeutic activities;Therapeutic exercise;Patient/family education    PT Goals (Current goals can be found in the Care Plan section)  Acute Rehab PT Goals Patient Stated Goal: to improve mobility PT Goal Formulation: With patient Time For Goal Achievement: 11/03/23 Potential to Achieve Goals: Good    Frequency Min 2X/week        AM-PAC PT 6 Clicks Mobility  Outcome Measure Help needed turning from your back to your side while in a flat bed without using bedrails?: A Little Help needed moving from lying on your back to sitting on the side of a flat bed without using bedrails?: A Little Help needed moving to and from a bed to a chair (including a wheelchair)?: A Little Help needed standing up from a chair using your arms (e.g., wheelchair or bedside chair)?: A Little Help needed to walk in hospital room?: A Little Help needed climbing 3-5 steps with a railing? : Total 6 Click Score: 16    End of Session Equipment Utilized During Treatment: Gait belt Activity Tolerance: Patient tolerated treatment well;Patient limited by fatigue Patient left: in chair;with chair alarm set;with call bell/phone within reach Nurse Communication: Mobility status PT Visit Diagnosis: Unsteadiness on feet (R26.81);Other abnormalities of gait and mobility (R26.89);Muscle weakness (generalized) (M62.81)    Time: 8957-8879 PT Time Calculation (min) (ACUTE ONLY): 38 min   Charges:   PT Evaluation $  PT Eval Low Complexity: 1 Low PT Treatments $Gait Training: 8-22 mins $Therapeutic  Activity: 8-22 mins PT General Charges $$ ACUTE PT VISIT: 1 Visit        Dorothyann Maier, DPT, CLT  Acute Rehabilitation Services Office: [phone] (Secure chat preferred)   Dorothyann VEAR Maier 10/20/2023, 11:46 AM

## 2023-10-20 NOTE — Progress Notes (Signed)
 Bronchoscopy with EBUS is planned for Friday 9/12 at 7:30 AM N.p.o. after midnight Hold heparin  at 1 AM  Discussed with daughter, patient down in ultrasound  Cinderella Christoffersen V. Jude MD

## 2023-10-20 NOTE — Progress Notes (Signed)
 BLE venous exam is completed. Bronda Alfred, RVT

## 2023-10-20 NOTE — Progress Notes (Addendum)
 STROKE TEAM PROGRESS NOTE   INTERIM HISTORY/SUBJECTIVE Patient with known history of lung cancer presents with acute DVT, pulmonary embolism in right subclavian vein, jugular vein and transverse sinus thrombosis likely due to cancer related hypercoagulability.  MRI brain also shows small infarct in splenium of corpus callosum on the right Family at the bedside.  Patient laying in the bed in no apparent distress.  She complains that she is hungry wants to eat  she is on heparin  drip New neurological events overnight   CBC    Component Value Date/Time   WBC 5.1 10/20/2023 0246   RBC 3.61 (L) 10/20/2023 0246   HGB 10.0 (L) 10/20/2023 0246   HCT 31.3 (L) 10/20/2023 0246   PLT 160 10/20/2023 0246   MCV 86.7 10/20/2023 0246   MCH 27.7 10/20/2023 0246   MCHC 31.9 10/20/2023 0246   RDW 14.2 10/20/2023 0246   LYMPHSABS 1.0 10/18/2023 1630   MONOABS 0.6 10/18/2023 1630   EOSABS 0.1 10/18/2023 1630   BASOSABS 0.0 10/18/2023 1630    BMET    Component Value Date/Time   NA 134 (L) 10/20/2023 0246   K 3.9 10/20/2023 0246   CL 102 10/20/2023 0246   CO2 23 10/20/2023 0246   GLUCOSE 95 10/20/2023 0246   BUN 10 10/20/2023 0246   CREATININE 0.67 10/20/2023 0246   CREATININE 0.72 04/03/2021 0000   CALCIUM  8.9 10/20/2023 0246   EGFR 99 04/03/2021 0000   GFRNONAA >60 10/20/2023 0246    IMAGING past 24 hours VAS US  LOWER EXTREMITY VENOUS (DVT) Result Date: 10/20/2023  Lower Venous DVT Study Patient Name:  Andrea Mullen  Date of Exam:   10/20/2023 Medical Rec #: [id number]         Accession #:    [id number] Date of Birth: Jun 21, 1965         Patient Gender: F Patient Age:   58 years Exam Location:  [facility] Procedure:      VAS US  LOWER EXTREMITY VENOUS (DVT) Referring Phys: MAXIMINO SHARPS --------------------------------------------------------------------------------  Indications: Pulmonary embolism.  Performing Technologist: Elmarie Lindau, RVT  Examination Guidelines: A complete  evaluation includes B-mode imaging, spectral Doppler, color Doppler, and power Doppler as needed of all accessible portions of each vessel. Bilateral testing is considered an integral part of a complete examination. Limited examinations for reoccurring indications may be performed as noted. The reflux portion of the exam is performed with the patient in reverse Trendelenburg.  +---------+---------------+---------+-----------+----------+--------------+ RIGHT    CompressibilityPhasicitySpontaneityPropertiesThrombus Aging +---------+---------------+---------+-----------+----------+--------------+ CFV      Full           Yes      Yes                                 +---------+---------------+---------+-----------+----------+--------------+ SFJ      Full                                                        +---------+---------------+---------+-----------+----------+--------------+ FV Prox  Full                                                        +---------+---------------+---------+-----------+----------+--------------+  FV Mid   Full                                                        +---------+---------------+---------+-----------+----------+--------------+ FV DistalFull                                                        +---------+---------------+---------+-----------+----------+--------------+ PFV      Full                                                        +---------+---------------+---------+-----------+----------+--------------+ POP      Partial                                      Chronic        +---------+---------------+---------+-----------+----------+--------------+ PTV      Full                                                        +---------+---------------+---------+-----------+----------+--------------+ PERO     Full                                                         +---------+---------------+---------+-----------+----------+--------------+   +---------+---------------+---------+-----------+----------+--------------+ LEFT     CompressibilityPhasicitySpontaneityPropertiesThrombus Aging +---------+---------------+---------+-----------+----------+--------------+ CFV      Full           Yes      Yes                                 +---------+---------------+---------+-----------+----------+--------------+ SFJ      Full                                                        +---------+---------------+---------+-----------+----------+--------------+ FV Prox  Full                                                        +---------+---------------+---------+-----------+----------+--------------+ FV Mid   Full                                                        +---------+---------------+---------+-----------+----------+--------------+  FV DistalFull                                                        +---------+---------------+---------+-----------+----------+--------------+ PFV      Full                                                        +---------+---------------+---------+-----------+----------+--------------+ POP      Partial                                      Chronic        +---------+---------------+---------+-----------+----------+--------------+ PTV      Full                                                        +---------+---------------+---------+-----------+----------+--------------+ PERO     Full                                                        +---------+---------------+---------+-----------+----------+--------------+     Summary: RIGHT: - Findings consistent 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  - No cystic structure found in the popliteal fossa.  LEFT: - Findings consistent with chronic deep vein thrombosis involving the left popliteal vein.  - No cystic  structure found in the popliteal fossa.  *See table(s) above for measurements and observations. Electronically signed by Lonni Gaskins MD on 10/20/2023 at 2:53:16 PM.    Final    MR Brain W and Wo Contrast Result Date: 10/19/2023 CLINICAL DATA:  Initial evaluation for dural venous sinus thrombosis, acute neuro deficit. EXAM: MRI HEAD WITHOUT AND WITH CONTRAST TECHNIQUE: Multiplanar, multiecho pulse sequences of the brain and surrounding structures were obtained without and with intravenous contrast. CONTRAST:  10mL GADAVIST  GADOBUTROL  1 MMOL/ML IV SOLN COMPARISON:  Comparison made with CT from earlier the same day as well as multiple previous exams. FINDINGS: Brain: Diffuse prominence of the CSF containing spaces compatible generalized cerebral atrophy. Patchy and confluent T2/FLAIR hyperintensity involving the periventricular deep white matter both cerebral hemispheres, consistent with chronic microvascular ischemic disease, moderately advanced in nature. Mild patchy involvement of the pons and cerebellum noted. Few remote lacunar infarcts noted about the bilateral basal ganglia. Remote right pontine infarct with a few scattered small remote bilateral cerebellar infarcts. 6 mm acute ischemic nonhemorrhagic infarcts seen involving the right splenium (series 5, image 75). No other evidence for acute or subacute ischemia. Gray-white matter differentiation otherwise maintained. No acute intracranial hemorrhage. Multiple scattered chronic micro hemorrhages noted, likely hypertensive in nature. No mass lesion, midline shift, or mass effect. Mild ventricular prominence related to global parenchymal volume loss of hydrocephalus. No extra-axial fluid collection. Pituitary gland and suprasellar region within normal limits. There is an apparent 6  mm focus of enhancement involving the right pons (series 17, image 14). This is at the location of an underlying right pontine infarct as well as a few prominent chronic micro  hemorrhages. Given this, finding is suspected to reflect enhancement related to a small of all vein subacute infarct or possibly artifact related to the adjacent microhemorrhage. No other abnormal enhancement elsewhere within the brain. Vascular: Major intracranial arterial vascular flow voids are maintained. Previously identified dural venous sinus thrombosis involving the right transverse and sigmoid sinuses with extension into the right jugular bulb and visualized proximal right internal jugular vein, corresponding with findings on recent exams. Overall, appearance is relatively similar. Skull and upper cervical spine: Craniocervical junction within normal limits. Heterogeneous and mildly decreased T1 signal intensity within the visualized bone marrow, nonspecific, but most commonly related to anemia, smoking or obesity. No scalp soft tissue abnormality. Sinuses/Orbits: Globes orbital soft tissues within normal limits. Mild chronic mucosal thickening noted about the paranasal sinuses. Moderate to large right mastoid and middle ear effusion. Trace left mastoid effusion noted. Other: None. IMPRESSION: 1. 6 mm acute ischemic nonhemorrhagic infarct involving the right splenium. 2. 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 Overall, appearance is relatively stable from prior. 3. 6 mm focus of enhancement involving the right pons, favored to reflect enhancement related to a small subacute infarct or possibly artifact related to the adjacent microhemorrhage. Short interval follow-up MRI in 3 months suggested to ensure resolution and/or stability. 4. Underlying age-related cerebral atrophy with moderately advanced chronic microvascular ischemic disease, with a few scattered remote lacunar infarcts involving the bilateral basal ganglia, right pons, and bilateral cerebellar  hemispheres. 5. Multiple scattered chronic micro hemorrhages, likely hypertensive in nature. Electronically Signed   By: Morene Hoard M.D.   On: 10/19/2023 23:23    Vitals:   10/20/23 0400 10/20/23 0600 10/20/23 0726 10/20/23 1052  BP: 98/72  110/63 97/86  Pulse: 88   (!) 104  Resp: 17 19 20 20   Temp:   98.5 F (36.9 C) 98.2 F (36.8 C)  TempSrc:   Oral Oral  SpO2: 98%  91% 93%  Weight:      Height:         PHYSICAL EXAM General:  Alert, well-nourished, well-developed patient in no acute distress Psych:  Mood and affect appropriate for situation CV: Regular rate and rhythm on monitor Respiratory:  Regular, unlabored respirations on room air GI: Abdomen soft and nontender   NEURO:  Mental Status: AA&Ox3, patient is able to give clear and coherent history Speech/Language: speech is without dysarthria or aphasia.  Naming, repetition, fluency, and comprehension intact.  Cranial Nerves:  II: PERRL. Visual fields full.  III, IV, VI: EOMI. Eyelids elevate symmetrically.  V: Sensation is intact to light touch and symmetrical to face.  VII: Face is symmetrical resting and smiling VIII: hearing intact to voice. IX, X: Palate elevates symmetrically. Phonation is normal.  KP:Dynloizm shrug 5/5. XII: tongue is midline without fasciculations. Motor: 5/5 strength in bilateral uppers and right lower right leg 4/5 with drift Tone: is normal and bulk is normal Sensation-creased sensation on left Coordination: FTN intact bilaterally, HKS: no ataxia in BLE.No drift.  Gait- deferred  Most Recent NIH  1a Level of Conscious.:  1b LOC Questions:  1c LOC Commands:  2 Best Gaze:  3 Visual:  4 Facial Palsy:  5a Motor Arm - left:  5b Motor Arm - Right:  6a Motor Leg - Left: 1 6b Motor Leg - Right:  7 Limb Ataxia:  8 Sensory: 1 9 Best Language:  10 Dysarthria:  11 Extinct. and Inatten.:  TOTAL: 2   ASSESSMENT/PLAN  Andrea Mullen is a 58 y.o. 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  She had workup which demonstrated thrombus extending from distal half of the right transverse sinus, right sigmoid sinus and into the right internal jugular and extending into brachiocephalic vein, and a nonocclusive thrombus in the superior vena cava. NIH on Admission 2  Acute Ischemic Infarct:  right splenium and pons  Etiology: Small vessel disease versus cancer related hypercoagulability  CVST  of right transverse and sigmoid sinuses extending into the right jugular bulb and visualized proximal right internal jugular vein etiology likely due to hypercoagulable state from Cancer  CT head  Filling defect within the right transverse and sigmoid sinuses extending into the right jugular bulb and visualized proximal right internal jugular vein, consistent with dural venous sinus thrombosis. This extends inferiorly throughout the right IJ within the neck.  cerebral atrophy with moderate chronic small vessel ischemic disease. remote lacunar infarcts about the bilateral basal ganglia and right thalamus CTV Stable Thrombosis of the right IJ, right sigmoid sinus, and lateral half of the right transverse dural venous sinus MRI  6 mm acute ischemic nonhemorrhagic infarct involving the right   splenium. Acute dural venous sinus thrombosis involving the right transverse and sigmoid sinuses with extension into the right jugular. 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 LDL 182 HgbA1c 5.3 VTE prophylaxis - heparin  IV  No antithrombotic prior to admission, now on heparin  IV Will need to transition to Warfarin  Therapy recommendations:  Pending Disposition:  Pending   History of  stroke remote lacunar infarcts about the bilateral basal ganglia and right thalamus  Hypertension Diastolic heart failure RV dysfunction Severe aortic stenosis Home meds: None Stable Blood Pressure Goal: SBP less than 160   Hyperlipidemia Home meds: Crestor  40 mg, resumed in hospital patient noncompliant with medication LDL 182, goal < 70 Continue statin at discharge  History of small cell lung cancer New mass in right upper lobe Per primary team CCM consulted for workup concern for malignancy  Segmental pulmonary emboli  Occlusive thrombus in brachiocephalic vein, nonocclusive thrombus in SVC , azygous, and extensive nonocclusive thormbus within the right axillary and right subclavian vein. On heparin  IV.  Will need to consider transition to likely Coumadin  Hypercoagulable due to concerning right lung mass for density CCM consulted for concern for malignancy  Tobacco Abuse Patient smokes cigarettes daily half a pack per day       Ready to quit? No Nicotine  replacement therapy provided  Substance Abuse Patient with history of cocaine  and marijuana use UDS ordered      Ready to quit? No TOC consult for cessation placed  Per primary team  Dysphagia Patient has post-stroke dysphagia, SLP consulted    Diet   Diet Heart Room service appropriate? Yes; Fluid consistency: Thin   Advance diet as tolerated  Other Stroke Risk Factors  ETOH use, alcohol level <10, advised to drink no more than  1 drink(s) a day  Obesity, Body mass index is 35.99 kg/m., BMI >/= 30 associated with increased stroke risk, recommend weight loss, diet and exercise as appropriate     Other Active Problems Anemia  Hospital day # 1   Karna Geralds DNP, ACNPC-AG  [facility]  I have personally obtained history,examined this patient, reviewed notes, independently viewed imaging studies, participated in medical decision making and plan of care.ROS completed by me personally and  pertinent positives fully documented  I have made any additions or clarifications directly to the above note. Agree with note above.  Patient has presented with small right corpus callosum subcortical infarct in the setting of cancer related hypercoagulability with concurrent DVT, pulm embolism, right subclavian, jugular vein and transverse sinus thrombosis.  Continue anticoagulation with IV heparin  and aggressive hydration.  Will likely need transition to warfarin or full dose Lovenox  at discharge.  Continue ongoing stroke workup.  Aggressive risk factor modification.  Long discussion with patient and family at the bedside and answered questions.   I personally spent a total of 50 minutes in the care of the patient today including getting/reviewing separately obtained history, performing a medically appropriate exam/evaluation, counseling and educating, placing orders, referring and communicating with other health care professionals, documenting clinical information in the EHR, independently interpreting results, and coordinating care.        Eather Popp, MD Medical Director [facility] Pager: [phone] 10/20/2023 4:46 PM    To contact Stroke Continuity provider, please refer to [email or web address]. After hours, contact General Neurology

## 2023-10-20 NOTE — Hospital Course (Addendum)
 Brief Narrative:  58 year old with history of HTN, HLD, lung cancer s/p radiation/chemo, left eye blindness presented for chest pain and dyspnea.  Patient found to have head and neck thrombosis/dural sinus thrombosis and acute CVA.  Neurology and vascular surgery were consulted and patient was started on IV heparin .  Patient was also found to have right-sided cavitary lesion therefore pulmonary team was consulted.  Underwent bronchoscopy and pathology was sent.  Currently on empiric antibiotics for total of 10 days.  Oncology was consulted.  Lymph node aspiration is consistent with non-small cell lung cancer.  Heparin  will be transition to p.o. Eliquis .  Oncology to make outpatient follow-up arrangements.  Will discharge the patient today with home health arrangements.   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  CTA chest revealed cavitary mass with bulky lymphadenopathy, subsegmental PE, nonocclusive SVC thrombus, right azygous vein, right axillary vein, bilateral subclavian thrombus.  Also found to have occlusive thrombus in the right brachiocephalic vein.  CT head and neck showed subocclusive right intraventricular/dural venous sinus thrombosis.  Oncology team is following.  Currently on heparin  drip.  Will eventually transition to subcu Lovenox .  Discussed with neurology and oncology.  Patient is agreeable to subcu Lovenox .  Cost is roughly $4 a month.  If need to use DOAC, cost would be the same.     Cavitary lesion right upper lung Bulky lymphadenopathy Hx of Small cell Carcinoma, tx at [facility] in 2007? Non Small cell Lung Ca, new Dx.  -Right upper lung lesion measuring 4.4 cm x 4.8 cm x 4.6 cm. Complicated by history of small cell lung cancer.  Status post bronchoscopy by pulmonary on 9/12.  Pathology from lymph node aspiration is consistent with  non-small cell lung cancer.  Oncology to follow-up outpatient and make arrangements. - Currently on empiric Rocephin /Flagyl  for total of 10 days, EOT 9/21.  Transition patient to p.o. Ceftin  and Flagyl  -CT abdomen pelvis does not show evidence of metastatic disease    Left leg weakness  - PT/OT-home health   Acute stroke  - MRI confirms a 6 mm acute ischemic nonhemorrhagic infarct involving the right splenium and a 6 mm focus of enhancement involving the right pons.  Per neuro the etiology is likely small vessel disease.  LDL 182   Aortic valve stenosis Echo shows EF 65%, grade 1 DD, moderate to severe mitral valve calcification   Normocytic anemia  -hemoglobin stable, no bleeding.   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  DVT prophylaxis: Heparin  drip    Code Status: Full Code Family Communication: Daughter at bedside Status is: Inpatient Remains inpatient appropriate because:   PT Follow up Recs: Home Health Pt9/11/2023 1144  Subjective: Seen at bedside no complaints.  Wishing to go home.  All the questions have been answered by me.  Daughter present at bedside. Examination:  General exam: Appears calm and comfortable  Respiratory system: Mild coarse breath sounds bilaterally Cardiovascular system: S1 & S2 heard, RRR. No JVD, murmurs, rubs, gallops or clicks. No pedal edema. Gastrointestinal system: Abdomen is nondistended, soft and nontender. No organomegaly or masses felt. Normal bowel sounds heard. Central nervous system: Alert and oriented. No focal neurological deficits. Extremities: Symmetric 5 x 5 power. Skin: No rashes, lesions or ulcers Psychiatry: Judgement and insight appear normal. Mood & affect appropriate.

## 2023-10-20 NOTE — Progress Notes (Addendum)
 PROGRESS NOTE    Andrea Mullen  FMW:[id number] DOB: December 29, 1965 DOA: 10/18/2023 PCP: [facility]   Brief Narrative: Andrea Mullen is a 58 y.o. female with a history of hypertension, hyperlipidemia, lung cancer s/p radiation and chemotherapy, left eye blindness.  Patient presented secondary to chest pain and dyspnea was found to have evidence of head/neck thrombosis, dural sinus thrombosis, acute stroke.  Neurology and vascular surgery consulted.  Patient started on heparin  IV.  During workup, patient was also found to have evidence of a right cavitary lesion; pulmonology consulted with plan for bronchoscopy/EBUS.   Assessment and Plan:  Acute respiratory distress Present on admission.  No hypoxia.  Patient with evidence of a right upper lung cavitary lesion which could be contributory.  Symptoms appear to have resolved at this time.  Acute head/neck thrombus Acute thrombus involving SVC, brachiocephalic vein, azygous vein, right axillary vein and right subclavian vein. Heparin  IV started for management. Vascular surgery consulted and recommended no surgical intervention at this time. -Continue Heparin  IV with eventual transition to outpatient anticoagulation regimen  Dural sinus thrombosis Acute. Patient presented with a headache which has improved. Neurology on board. -Continue anticoagulation  Cavitary lesion Located in the right upper lung measuring 4.4 cm x 4.8 cm x 4.6 cm. Complicated by history of small cell lung cancer. Concerning for malignant etiology. Pulmonology consulted and plan for bronchoscopy with EBUS on 9/12.  History of small cell lung cancer Noted.  Left leg weakness PT/OT consulted.  Acute stroke MRI confirms a 6 mm acute ischemic nonhemorrhagic infarct involving the right splenium and a 6 mm focus of enhancement involving the right pons. Neurology on board. Per neurology, etiology is small vessel disease  Aortic valve stenosis Noted to be  severe on Transthoracic Echocardiogram from 2023. Transthoracic Echocardiogram ordered this admission and is pending. -Follow-up Transthoracic Echocardiogram   Normocytic anemia Baseline hemoglobin of 11-12. Hemoglobin down to 10-11 on admission. Stable.  Obesity, class II Estimated body mass index is 35.99 kg/m as calculated from the following:   Height as of this encounter: 5' 5 (1.651 m).   Weight as of this encounter: 98.1 kg.   DVT prophylaxis: Heparin  IV Code Status:   Code Status: Full Code Family Communication: Daughter at bedside Disposition Plan: Discharge pending eventual transition to outpatient anticoagulation, in addition to ongoing specialist recommendations   Consultants:  Pulmonology Vascular surgery Neurology  Procedures:  None  Antimicrobials: None    Subjective: Patient reports some right arm pain, no headache. No other concerns. Daughter was happy to see her mom was ambulating better.  Objective: BP 104/73 (BP Location: Right Wrist)   Pulse (!) 104   Temp 98 F (36.7 C) (Oral)   Resp (!) 25   Ht 5' 5 (1.651 m)   Wt 98.1 kg   LMP 11/16/2016   SpO2 100%   BMI 35.99 kg/m   Examination:  General exam: Appears calm and comfortable Respiratory system: Clear to auscultation. Respiratory effort normal. Cardiovascular system: S1 & S2 heard, RRR. 3/6 systolic murmur. Gastrointestinal system: Abdomen is nondistended, soft and nontender.  Normal bowel sounds heard. Central nervous system: Alert and oriented. No focal neurological deficits. Musculoskeletal: No edema. No calf tenderness Skin: No cyanosis. No rashes Psychiatry: Judgement and insight appear normal. Mood & affect appropriate.    Data Reviewed: I have personally reviewed following labs and imaging studies  CBC Lab Results  Component Value Date   WBC 5.1 10/20/2023   RBC 3.61 (L) 10/20/2023  HGB 10.0 (L) 10/20/2023   HCT 31.3 (L) 10/20/2023   MCV 86.7 10/20/2023   MCH 27.7  10/20/2023   PLT 160 10/20/2023   MCHC 31.9 10/20/2023   RDW 14.2 10/20/2023   LYMPHSABS 1.0 10/18/2023   MONOABS 0.6 10/18/2023   EOSABS 0.1 10/18/2023   BASOSABS 0.0 10/18/2023     Last metabolic panel Lab Results  Component Value Date   NA 134 (L) 10/20/2023   K 3.9 10/20/2023   CL 102 10/20/2023   CO2 23 10/20/2023   BUN 10 10/20/2023   CREATININE 0.67 10/20/2023   GLUCOSE 95 10/20/2023   GFRNONAA >60 10/20/2023   GFRAA >60 08/04/2018   CALCIUM  8.9 10/20/2023   PROT 7.8 10/18/2023   ALBUMIN 3.9 10/18/2023   BILITOT 0.5 10/18/2023   ALKPHOS 76 10/18/2023   AST 21 10/18/2023   ALT 13 10/18/2023   ANIONGAP 9 10/20/2023    GFR: Estimated Creatinine Clearance: 91 mL/min (by C-G formula based on SCr of 0.67 mg/dL).  Recent Results (from the past 240 hours)  Resp panel by RT-PCR (RSV, Flu A&B, Covid) Anterior Nasal Swab     Status: None   Collection Time: 10/14/23  8:30 AM   Specimen: Anterior Nasal Swab  Result Value Ref Range Status   SARS Coronavirus 2 by RT PCR NEGATIVE NEGATIVE Final   Influenza A by PCR NEGATIVE NEGATIVE Final   Influenza B by PCR NEGATIVE NEGATIVE Final    Comment: (NOTE) The Xpert Xpress SARS-CoV-2/FLU/RSV plus assay is intended as an aid in the diagnosis of influenza from Nasopharyngeal swab specimens and should not be used as a sole basis for treatment. Nasal washings and aspirates are unacceptable for Xpert Xpress SARS-CoV-2/FLU/RSV testing.  Fact Sheet for Patients: BloggerCourse.com  Fact Sheet for Healthcare Providers: SeriousBroker.it  This test is not yet approved or cleared by the [place]  FDA and has been authorized for detection and/or diagnosis of SARS-CoV-2 by FDA under an Emergency Use Authorization (EUA). This EUA will remain in effect (meaning this test can be used) for the duration of the COVID-19 declaration under Section 564(b)(1) of the Act, 21 U.S.C. section  360bbb-3(b)(1), unless the authorization is terminated or revoked.     Resp Syncytial Virus by PCR NEGATIVE NEGATIVE Final    Comment: (NOTE) Fact Sheet for Patients: BloggerCourse.com  Fact Sheet for Healthcare Providers: SeriousBroker.it  This test is not yet approved or cleared by the [place]  FDA and has been authorized for detection and/or diagnosis of SARS-CoV-2 by FDA under an Emergency Use Authorization (EUA). This EUA will remain in effect (meaning this test can be used) for the duration of the COVID-19 declaration under Section 564(b)(1) of the Act, 21 U.S.C. section 360bbb-3(b)(1), unless the authorization is terminated or revoked.  Performed at [facility], [address], [place], [place] [address]   MRSA Next Gen by PCR, Nasal     Status: None   Collection Time: 10/19/23  1:55 PM   Specimen: Nasal Mucosa; Nasal Swab  Result Value Ref Range Status   MRSA by PCR Next Gen NOT DETECTED NOT DETECTED Final    Comment: (NOTE) The GeneXpert MRSA Assay (FDA approved for NASAL specimens only), is one component of a comprehensive MRSA colonization surveillance program. It is not intended to diagnose MRSA infection nor to guide or monitor treatment for MRSA infections. Test performance is not FDA approved in patients less than 51 years old. Performed at [facility], [address]  [place], [place] [address]       Radiology Studies: VAS US  LOWER EXTREMITY VENOUS (DVT) Result Date: 10/20/2023  Lower Venous DVT Study Patient Name:  Andrea Mullen  Date of Exam:   10/20/2023 Medical Rec #: [id number]         Accession #:    [id number] Date of Birth: May 29, 1965         Patient Gender: F Patient Age:   2 years Exam Location:  [facility] Procedure:      VAS US  LOWER EXTREMITY VENOUS (DVT) Referring Phys: MAXIMINO SHARPS --------------------------------------------------------------------------------   Indications: Pulmonary embolism.  Performing Technologist: Elmarie Lindau, RVT  Examination Guidelines: A complete evaluation includes B-mode imaging, spectral Doppler, color Doppler, and power Doppler as needed of all accessible portions of each vessel. Bilateral testing is considered an integral part of a complete examination. Limited examinations for reoccurring indications may be performed as noted. The reflux portion of the exam is performed with the patient in reverse Trendelenburg.  +---------+---------------+---------+-----------+----------+--------------+ RIGHT    CompressibilityPhasicitySpontaneityPropertiesThrombus Aging +---------+---------------+---------+-----------+----------+--------------+ CFV      Full           Yes      Yes                                 +---------+---------------+---------+-----------+----------+--------------+ SFJ      Full                                                        +---------+---------------+---------+-----------+----------+--------------+ FV Prox  Full                                                        +---------+---------------+---------+-----------+----------+--------------+ FV Mid   Full                                                        +---------+---------------+---------+-----------+----------+--------------+ FV DistalFull                                                        +---------+---------------+---------+-----------+----------+--------------+ PFV      Full                                                        +---------+---------------+---------+-----------+----------+--------------+ POP      Partial                                      Chronic        +---------+---------------+---------+-----------+----------+--------------+ PTV  Full                                                        +---------+---------------+---------+-----------+----------+--------------+ PERO      Full                                                        +---------+---------------+---------+-----------+----------+--------------+   +---------+---------------+---------+-----------+----------+--------------+ LEFT     CompressibilityPhasicitySpontaneityPropertiesThrombus Aging +---------+---------------+---------+-----------+----------+--------------+ CFV      Full           Yes      Yes                                 +---------+---------------+---------+-----------+----------+--------------+ SFJ      Full                                                        +---------+---------------+---------+-----------+----------+--------------+ FV Prox  Full                                                        +---------+---------------+---------+-----------+----------+--------------+ FV Mid   Full                                                        +---------+---------------+---------+-----------+----------+--------------+ FV DistalFull                                                        +---------+---------------+---------+-----------+----------+--------------+ PFV      Full                                                        +---------+---------------+---------+-----------+----------+--------------+ POP      Partial                                      Chronic        +---------+---------------+---------+-----------+----------+--------------+ PTV      Full                                                        +---------+---------------+---------+-----------+----------+--------------+  PERO     Full                                                        +---------+---------------+---------+-----------+----------+--------------+     Summary: RIGHT: - Findings consistent 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  - No cystic structure found in the popliteal fossa.  LEFT: - Findings consistent with chronic  deep vein thrombosis involving the left popliteal vein.  - No cystic structure found in the popliteal fossa.  *See table(s) above for measurements and observations. Electronically signed by Lonni Gaskins MD on 10/20/2023 at 2:53:16 PM.    Final    MR Brain W and Wo Contrast Result Date: 10/19/2023 CLINICAL DATA:  Initial evaluation for dural venous sinus thrombosis, acute neuro deficit. EXAM: MRI HEAD WITHOUT AND WITH CONTRAST TECHNIQUE: Multiplanar, multiecho pulse sequences of the brain and surrounding structures were obtained without and with intravenous contrast. CONTRAST:  10mL GADAVIST  GADOBUTROL  1 MMOL/ML IV SOLN COMPARISON:  Comparison made with CT from earlier the same day as well as multiple previous exams. FINDINGS: Brain: Diffuse prominence of the CSF containing spaces compatible generalized cerebral atrophy. Patchy and confluent T2/FLAIR hyperintensity involving the periventricular deep white matter both cerebral hemispheres, consistent with chronic microvascular ischemic disease, moderately advanced in nature. Mild patchy involvement of the pons and cerebellum noted. Few remote lacunar infarcts noted about the bilateral basal ganglia. Remote right pontine infarct with a few scattered small remote bilateral cerebellar infarcts. 6 mm acute ischemic nonhemorrhagic infarcts seen involving the right splenium (series 5, image 75). No other evidence for acute or subacute ischemia. Gray-white matter differentiation otherwise maintained. No acute intracranial hemorrhage. Multiple scattered chronic micro hemorrhages noted, likely hypertensive in nature. No mass lesion, midline shift, or mass effect. Mild ventricular prominence related to global parenchymal volume loss of hydrocephalus. No extra-axial fluid collection. Pituitary gland and suprasellar region within normal limits. There is an apparent 6 mm focus of enhancement involving the right pons (series 17, image 14). This is at the location of an  underlying right pontine infarct as well as a few prominent chronic micro hemorrhages. Given this, finding is suspected to reflect enhancement related to a small of all vein subacute infarct or possibly artifact related to the adjacent microhemorrhage. No other abnormal enhancement elsewhere within the brain. Vascular: Major intracranial arterial vascular flow voids are maintained. Previously identified dural venous sinus thrombosis involving the right transverse and sigmoid sinuses with extension into the right jugular bulb and visualized proximal right internal jugular vein, corresponding with findings on recent exams. Overall, appearance is relatively similar. Skull and upper cervical spine: Craniocervical junction within normal limits. Heterogeneous and mildly decreased T1 signal intensity within the visualized bone marrow, nonspecific, but most commonly related to anemia, smoking or obesity. No scalp soft tissue abnormality. Sinuses/Orbits: Globes orbital soft tissues within normal limits. Mild chronic mucosal thickening noted about the paranasal sinuses. Moderate to large right mastoid and middle ear effusion. Trace left mastoid effusion noted. Other: None. IMPRESSION: 1. 6 mm acute ischemic nonhemorrhagic infarct involving the right splenium. 2. 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 Overall, appearance is relatively stable from prior. 3. 6 mm focus of enhancement involving the  right pons, favored to reflect enhancement related to a small subacute infarct or possibly artifact related to the adjacent microhemorrhage. Short interval follow-up MRI in 3 months suggested to ensure resolution and/or stability. 4. Underlying age-related cerebral atrophy with moderately advanced chronic microvascular ischemic disease, with a few scattered remote lacunar infarcts involving the  bilateral basal ganglia, right pons, and bilateral cerebellar hemispheres. 5. Multiple scattered chronic micro hemorrhages, likely hypertensive in nature. Electronically Signed   By: Morene Hoard M.D.   On: 10/19/2023 23:23   CT VENOGRAM HEAD Result Date: 10/19/2023 CLINICAL DATA:  58 year old female status post fall. Pain. Prior stroke. Left side weakness and numbness. History of lung cancer. Head and neck CT CT with contrast yesterday demonstrating central venous thrombosis, right IJ thrombosis involving the right transverse and sigmoid sinus also. EXAM: CT VENOGRAM HEAD TECHNIQUE: Venographic phase images of the brain were obtained following the administration of intravenous contrast. Multiplanar reformats and maximum intensity projections were generated. RADIATION DOSE REDUCTION: This exam was performed according to the departmental dose-optimization program which includes automated exposure control, adjustment of the mA and/or kV according to patient size and/or use of iterative reconstruction technique. CONTRAST:  75mL OMNIPAQUE  IOHEXOL  300 MG/ML  SOLN COMPARISON:  Head and neck CT yesterday.  Brain MRI 11/09/2021. FINDINGS: CT HEAD Post-contrast only. Stable cerebral volume. Chronic lacunar infarcts in the bilateral brain. No evidence of intracranial mass effect, acute ventriculomegaly, acute intracranial hemorrhage, acute cerebral edema. CTV HEAD Venous sinuses: Maintained enhancement and patency of the superior sagittal sinus, torcula, straight sinus, inferior sagittal sinus, internal cerebral veins. Maintained enhancement and patency of the left transverse, left sigmoid venous sinus, and visible left IJ bulb. Cavernous sinus enhancement appears symmetric and within normal limits also. Nonenhancing, thrombosed right IJ bulb, right sigmoid venous sinus, and distal half of the right transverse sinus (beginning on series 2, image 25. The medial right transverse sinus remains enhancing. The appearance  is unchanged from head and neck CT yesterday. Anatomic variants: None significant. Other findings: Calcified atherosclerosis at the skull base. Review of the MIP images confirms the above findings IMPRESSION: 1. Stable Thrombosis of the right IJ, right sigmoid sinus, and lateral half of the right transverse dural venous sinus since the CT Head and Neck yesterday. 2. No other intracranial venous thrombosis identified. No intracranial mass effect, hemorrhage, or acute cerebral edema identified. Electronically Signed   By: VEAR Hurst M.D.   On: 10/19/2023 08:20   CT Soft Tissue Neck W Contrast Result Date: 10/18/2023 CLINICAL DATA:  Initial evaluation for acute facial swelling. EXAM: CT NECK WITH CONTRAST TECHNIQUE: Multidetector CT imaging of the neck was performed using the standard protocol following the bolus administration of intravenous contrast. RADIATION DOSE REDUCTION: This exam was performed according to the departmental dose-optimization program which includes automated exposure control, adjustment of the mA and/or kV according to patient size and/or use of iterative reconstruction technique. CONTRAST:  OMNIPAQUE  IOHEXOL  350 MG/ML SOLN COMPARISON:  None Available. FINDINGS: Pharynx and larynx: Oral cavity within normal limits. Oropharynx and nasopharynx within normal limits. No retropharyngeal collection or swelling. Negative epiglottis. Hypopharynx and supraglottic larynx within normal limits. Negative glottis. Subglottic airway clear. Salivary glands: Salivary glands including the parotid and submandibular glands are within normal limits. Thyroid : Normal. Lymph nodes: No enlarged or pathologic lymph nodes within the neck. Vascular: Occlusive thrombus seen involving the brachiocephalic vein, extending into the visualized SVC. The vessels are expanded with hazy inflammatory stranding, consistent with acute thrombus. Clot extends  to partially involve the right greater than left subclavian veins as well  as the right axillary vein. Cephalad extension with subocclusive thrombus extending throughout the right internal jugular vein, with extension into the cranial vault to involve the right transverse and sigmoid sinuses. Left IJ remains patent. Multiple prominent venous collaterals noted within the right neck and visualized right chest wall. Normal arterial enhancement seen throughout the neck. Aortic atherosclerosis noted. Limited intracranial: No acute finding. Visualized orbits: No acute finding. Mastoids and visualized paranasal sinuses: Mild mucoperiosteal thickening about the ethmoidal air cells and maxillary sinuses. Right mastoid and middle ear effusion. Left mastoid air cells are clear. Skeleton: No worrisome osseous lesions. Upper chest: Approximate 5 cm thick walled cavitary mass at the posterior right upper lobe, partially visualize, better evaluated on concomitant CT of the chest. Other: None. IMPRESSION: 1. Acute occlusive and subocclusive thrombus involving the brachiocephalic vein, extending into the visualized SVC. Clot extends to partially involve the right greater than left subclavian veins as well as the right axillary vein. Cephalad extension with subocclusive thrombus extending throughout the right internal jugular vein, with extension into the cranial vault to involve the right transverse and sigmoid sinuses. 2. Approximate 5 cm thick walled cavitary mass at the posterior right upper lobe, partially visualized, better evaluated on concomitant CT of the chest. 3. Right mastoid and middle ear effusion, of uncertain significance. Correlation with physical exam recommended. 4.  Aortic Atherosclerosis (ICD10-I70.0). Electronically Signed   By: Morene Hoard M.D.   On: 10/18/2023 19:21   CT Head W or Wo Contrast Result Date: 10/18/2023 CLINICAL DATA:  Initial evaluation for acute headache, history of malignancy. EXAM: CT HEAD WITHOUT AND WITH CONTRAST TECHNIQUE: Contiguous axial images were  obtained from the base of the skull through the vertex without and with intravenous contrast. RADIATION DOSE REDUCTION: This exam was performed according to the departmental dose-optimization program which includes automated exposure control, adjustment of the mA and/or kV according to patient size and/or use of iterative reconstruction technique. CONTRAST:  OMNIPAQUE  IOHEXOL  350 MG/ML SOLN COMPARISON:  Prior CT from 09/20/2023 FINDINGS: Brain: Generalized age-related cerebral atrophy. Patchy and confluent hypodensity involving the supratentorial cerebral white matter, consistent with chronic small vessel ischemic disease, moderate in nature. Multiple scattered remote lacunar infarcts noted about the bilateral basal ganglia right thalamus, stable. No acute intracranial hemorrhage. No acute large vessel territory infarct. No mass lesion or midline shift. No hydrocephalus or extra-axial fluid collection. Vascular: No abnormal hyperdense vessel seen prior to contrast administration. Calcified atherosclerosis present at the skull base. Following contrast administration, filling defect within the right transverse and sigmoid sinuses extending into the right jugular bulb and visualized proximal right internal jugular vein, consistent with dural venous sinus thrombosis. This appears to extend inferiorly throughout the right IJ within the neck. Skull: Scalp soft tissues demonstrate no acute finding. Calvarium intact. Sinuses/Orbits: Globes orbital soft tissues within normal limits. Mild mucoperiosteal thickening present about the paranasal sinuses. Moderate right mastoid and middle ear effusion. Left mastoid air cells are clear. Other: None. IMPRESSION: 1. Filling defect within the right transverse and sigmoid sinuses extending into the right jugular bulb and visualized proximal right internal jugular vein, consistent with dural venous sinus thrombosis. This extends inferiorly throughout the right IJ within the neck.  2. No other acute intracranial abnormality. 3. Moderate right mastoid and middle ear effusion, of uncertain significance. Correlation with physical exam recommended. 4. Underlying age-related cerebral atrophy with moderate chronic small vessel ischemic disease, with multiple remote  lacunar infarcts about the bilateral basal ganglia and right thalamus. Electronically Signed   By: Morene Hoard M.D.   On: 10/18/2023 19:11   CT Angio Chest PE W and/or Wo Contrast Addendum Date: 10/18/2023 ADDENDUM REPORT: 10/18/2023 18:55 ADDENDUM: Results were discussed with Dr. Dreama at 6:41 p.m. [place] on October 18, 2023. Electronically Signed   By: Suzen Dials M.D.   On: 10/18/2023 18:55   Result Date: 10/18/2023 CLINICAL DATA:  History of lung cancer presenting with chest tightness. EXAM: CT ANGIOGRAPHY CHEST WITH CONTRAST TECHNIQUE: Multidetector CT imaging of the chest was performed using the standard protocol during bolus administration of intravenous contrast. Multiplanar CT image reconstructions and MIPs were obtained to evaluate the vascular anatomy. RADIATION DOSE REDUCTION: This exam was performed according to the departmental dose-optimization program which includes automated exposure control, adjustment of the mA and/or kV according to patient size and/or use of iterative reconstruction technique. CONTRAST:  OMNIPAQUE  IOHEXOL  350 MG/ML SOLN COMPARISON:  October 14, 2023 FINDINGS: Cardiovascular: There is marked severity calcification of the aortic arch without evidence of aortic aneurysm or dissection. The segmental and subsegmental pulmonary arteries are limited in evaluation secondary to suboptimal opacification with intravenous contrast. As result, pulmonary embolism within the segmental branches of the right lower lobe cannot be excluded (axial CT images 132 through 142, CT series 6). Occlusive thrombus is noted within the brachiocephalic vein. This is present on the prior study.  Nonocclusive thrombus is seen within the superior vena cava (axial CT images 39 through 45, CT series 5). Nonocclusive thrombus is also noted within the azygous vein (axial CT images 44 through 78, CT series 5). A partially thrombosed venous structure is seen within the anteromedial aspect of the upper right lung (axial CT image 44 through 53, CT series 5). Extensive, nonocclusive thrombus is noted within the right axillary vein and right subclavian vein (axial CT images 5 through 52, CT series 5) Numerous thin, tortuous, enhancing venous structures are seen within the anterior and posterior aspects of the chest wall on the right. Normal heart size with mild to moderate severity coronary artery calcification and marked severity calcification of the mitral annulus. No pericardial effusion. Mediastinum/Nodes: There is mild right hilar lymphadenopathy. Thyroid  gland, trachea, and esophagus demonstrate no significant findings. Lungs/Pleura: A 4.4 cm x 4.8 cm x 4.6 cm thick walled cavitary lesion is again seen within the posterior aspect of the right upper lobe. Mild anteromedial right middle lobe linear atelectasis is noted. No pleural effusion or pneumothorax identified. Upper Abdomen: There is a small hiatal hernia. Diffuse fatty infiltration of the liver parenchyma is noted. Noninflamed diverticula are seen throughout the visualized portions of the large bowel. Musculoskeletal: No chest wall abnormality. No acute or significant osseous findings. Review of the MIP images confirms the above findings. IMPRESSION: 1. Limited evaluation of the segmental and subsegmental pulmonary arteries secondary to suboptimal opacification with intravenous contrast. As result, pulmonary embolism within the segmental branches of the right lower lobe cannot be excluded. 2. Occlusive thrombus within the brachiocephalic vein, nonocclusive thrombus within the superior vena cava and azygous vein, and extensive, nonocclusive thrombus within  the right axillary vein and right subclavian vein. 3. 4.4 cm x 4.8 cm x 4.6 cm thick walled cavitary lesion within the posterior aspect of the right upper lobe which is seen on the prior study and may be consistent with the patient's history of lung cancer. 4. Mild right hilar lymphadenopathy. 5. Small hiatal hernia. 6. Hepatic steatosis. 7. Colonic diverticulosis.  8. Aortic atherosclerosis. Electronically Signed: By: Suzen Dials M.D. On: 10/18/2023 18:52      LOS: 1 day    Elgin Lam, MD [facility] 10/20/2023, 4:29 PM   If 7PM-7AM, please contact night-coverage [email or web address]

## 2023-10-20 NOTE — Plan of Care (Signed)
  Problem: Education: Goal: Knowledge of disease or condition will improve Outcome: Progressing Goal: Knowledge of secondary prevention will improve (MUST DOCUMENT ALL) Outcome: Progressing Goal: Knowledge of patient specific risk factors will improve (DELETE if not current risk factor) Outcome: Progressing   Problem: Ischemic Stroke/TIA Tissue Perfusion: Goal: Complications of ischemic stroke/TIA will be minimized Outcome: Progressing   Problem: Coping: Goal: Will verbalize positive feelings about self Outcome: Progressing Goal: Will identify appropriate support needs Outcome: Progressing   Problem: Health Behavior/Discharge Planning: Goal: Ability to manage health-related needs will improve Outcome: Progressing Goal: Goals will be collaboratively established with patient/family Outcome: Progressing   Problem: Self-Care: Goal: Ability to participate in self-care as condition permits will improve Outcome: Progressing Goal: Verbalization of feelings and concerns over difficulty with self-care will improve Outcome: Progressing Goal: Ability to communicate needs accurately will improve Outcome: Progressing   Problem: Nutrition: Goal: Risk of aspiration will decrease Outcome: Progressing Goal: Dietary intake will improve Outcome: Progressing   Problem: Education: Goal: Knowledge of disease or condition will improve Outcome: Progressing Goal: Knowledge of secondary prevention will improve (MUST DOCUMENT ALL) Outcome: Progressing Goal: Knowledge of patient specific risk factors will improve (DELETE if not current risk factor) Outcome: Progressing   Problem: Ischemic Stroke/TIA Tissue Perfusion: Goal: Complications of ischemic stroke/TIA will be minimized Outcome: Progressing   Problem: Coping: Goal: Will verbalize positive feelings about self Outcome: Progressing Goal: Will identify appropriate support needs Outcome: Progressing   Problem: Health Behavior/Discharge  Planning: Goal: Ability to manage health-related needs will improve Outcome: Progressing Goal: Goals will be collaboratively established with patient/family Outcome: Progressing   Problem: Self-Care: Goal: Ability to participate in self-care as condition permits will improve Outcome: Progressing Goal: Verbalization of feelings and concerns over difficulty with self-care will improve Outcome: Progressing Goal: Ability to communicate needs accurately will improve Outcome: Progressing   Problem: Nutrition: Goal: Risk of aspiration will decrease Outcome: Progressing Goal: Dietary intake will improve Outcome: Progressing

## 2023-10-20 NOTE — Plan of Care (Signed)
 MRI brain completed, images personally reviewed.  1. 6 mm acute ischemic nonhemorrhagic infarct involving the right splenium. 2. 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 Overall, appearance is relatively stable from prior. 3. 6 mm focus of enhancement involving the right pons, favored to reflect enhancement related to a small subacute infarct or possibly artifact related to the adjacent microhemorrhage. Short interval follow-up MRI in 3 months suggested to ensure resolution and/or stability. 4. Underlying age-related cerebral atrophy with moderately advanced chronic microvascular ischemic disease, with a few scattered remote lacunar infarcts involving the bilateral basal ganglia, right pons, and bilateral cerebellar hemispheres. 5. Multiple scattered chronic micro hemorrhages, likely hypertensive in nature.  A/R: 58 y.o. 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  She was found to have extensive thrombus extending from distal half of the right transverse sinus down through this entire sigmoid sinus, right internal jugular vein, brachiocephalic vein and nonocclusive in the SVC.  - No acute hemorrhage on MRI brain. Scattered chronic microhemorrhages are not a contraindication to IV heparin , which is indicated given the high risk of development of a massive venous infarction if not treated with anticoagulation.  - No change to plan as outlined in Dr. Gill consult note.   Electronically signed: Dr. Rodgerick Gilliand

## 2023-10-20 NOTE — Progress Notes (Signed)
 Interventional Radiology Brief Note:  Andrea Mullen is a 58 year old female with history of prior small cell carcinoma who underwent treatment at [facility].  She is now admitted with concern for new disease.  IR consulted for lung mass biopsy, however CCM/Pulm also following and have appropriately planned for bronch/EBUS later this week.  Consult for CT-guided lung mass biopsy canceled and team made aware.   Rachal Dvorsky, MS RD PA-C

## 2023-10-21 DIAGNOSIS — G08 Intracranial and intraspinal phlebitis and thrombophlebitis: Secondary | ICD-10-CM | POA: Diagnosis not present

## 2023-10-21 DIAGNOSIS — R590 Localized enlarged lymph nodes: Secondary | ICD-10-CM | POA: Diagnosis not present

## 2023-10-21 DIAGNOSIS — J984 Other disorders of lung: Secondary | ICD-10-CM | POA: Diagnosis not present

## 2023-10-21 LAB — SURGICAL PCR SCREEN
MRSA, PCR: NEGATIVE
Staphylococcus aureus: NEGATIVE

## 2023-10-21 LAB — CBC
HCT: 32 % — ABNORMAL LOW (ref 36.0–46.0)
Hemoglobin: 10.1 g/dL — ABNORMAL LOW (ref 12.0–15.0)
MCH: 27.3 pg (ref 26.0–34.0)
MCHC: 31.6 g/dL (ref 30.0–36.0)
MCV: 86.5 fL (ref 80.0–100.0)
Platelets: 185 K/uL (ref 150–400)
RBC: 3.7 MIL/uL — ABNORMAL LOW (ref 3.87–5.11)
RDW: 14.1 % (ref 11.5–15.5)
WBC: 4.8 K/uL (ref 4.0–10.5)
nRBC: 0 % (ref 0.0–0.2)

## 2023-10-21 LAB — HEPARIN LEVEL (UNFRACTIONATED)
Heparin Unfractionated: 0.77 [IU]/mL — ABNORMAL HIGH (ref 0.30–0.70)
Heparin Unfractionated: 0.51 [IU]/mL (ref 0.30–0.70)

## 2023-10-21 MED ORDER — CHLORHEXIDINE GLUCONATE CLOTH 2 % EX PADS
6.0000 | MEDICATED_PAD | Freq: Once | CUTANEOUS | Status: AC
  Administered 2023-10-22: 6 via TOPICAL

## 2023-10-21 MED ORDER — POLYETHYLENE GLYCOL 3350 17 G PO PACK
17.0000 g | PACK | Freq: Every day | ORAL | Status: DC
  Administered 2023-10-22 – 2023-10-23 (×2): 17 g via ORAL
  Filled 2023-10-21 (×3): qty 1

## 2023-10-21 MED ORDER — SENNOSIDES-DOCUSATE SODIUM 8.6-50 MG PO TABS
1.0000 | ORAL_TABLET | Freq: Two times a day (BID) | ORAL | Status: DC
  Administered 2023-10-22 – 2023-10-24 (×5): 1 via ORAL
  Filled 2023-10-21 (×8): qty 1

## 2023-10-21 MED ORDER — HEPARIN (PORCINE) 25000 UT/250ML-% IV SOLN: 1100.0000 [IU]/h | INTRAVENOUS | Status: AC

## 2023-10-21 NOTE — Plan of Care (Signed)
  Pulmonology is following based on Dr. Jude note reviewed plan for bronchoscopy tomorrow 9/12  at 7:30 AM.  Patient supposed to be n.p.o. after midnight and need to hold the heparin  drip after midnight.  Informed patient RN regarding this.   Jaria Conway, MD [facility] 10/21/2023, 8:52 PM

## 2023-10-21 NOTE — TOC Initial Note (Signed)
 Transition of Care [facility]) - Initial/Assessment Note    Patient Details  Name: Andrea Mullen MRN: [id number] Date of Birth: 20-Jun-1965  [facility] CM/SW Contact:    Roxie KANDICE Stain, RN Phone Number: 10/21/2023, 12:49 PM  Clinical Narrative:                 Spoke to patient regarding transition needs.  Patient lives with daughter and is agreeable to OP rehab. Referral sent to [facility] OP [facility].  Walker and BSC ordered from [employer].  Patient has new PCP she will follow up with in October.  ICM (Inpatient Care Management) will continue to follow for needs.  Expected Discharge Plan: OP Rehab Barriers to Discharge: Continued Medical Work up   Patient Goals and CMS Choice Patient states their goals for this hospitalization and ongoing recovery are:: return home CMS Medicare.gov Compare Post Acute Care list provided to:: Patient Choice offered to / list presented to : Patient      Expected Discharge Plan and Services   Discharge Planning Services: CM Consult   Living arrangements for the past 2 months: Apartment                 DME Arranged: Bedside commode, Walker rolling DME Agency: [employer] Date DME Agency Contacted: 10/20/23 Time DME Agency Contacted: [phone] Representative spoke with at DME Agency: [place]            Prior Living Arrangements/Services Living arrangements for the past 2 months: Apartment Lives with:: Adult Children Patient language and need for interpreter reviewed:: Yes Do you feel safe going back to the place where you live?: Yes      Need for Family Participation in Patient Care: Yes (Comment) Care giver support system in place?: Yes (comment) Current home services: DME Criminal Activity/Legal Involvement Pertinent to Current Situation/Hospitalization: No - Comment as needed  Activities of Daily Living   ADL Screening (condition at time of admission) Independently performs ADLs?: No Does the patient have a NEW  difficulty with bathing/dressing/toileting/self-feeding that is expected to last >3 days?: No Does the patient have a NEW difficulty with getting in/out of bed, walking, or climbing stairs that is expected to last >3 days?: No Does the patient have a NEW difficulty with communication that is expected to last >3 days?: No Is the patient deaf or have difficulty hearing?: No Does the patient have difficulty seeing, even when wearing glasses/contacts?: No Does the patient have difficulty concentrating, remembering, or making decisions?: No  Permission Sought/Granted         Permission granted to share info w AGENCY: OP Pt        Emotional Assessment Appearance:: Appears stated age Attitude/Demeanor/Rapport: Engaged Affect (typically observed): Accepting Orientation: : Oriented to Self, Oriented to Place, Oriented to  Time, Oriented to Situation Alcohol / Substance Use: Not Applicable Psych Involvement: No (comment)  Admission diagnosis:  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] Axillary vein thrombosis (HCC) [P17.A19] Cavitary lesion of lung [J98.4] Left-sided weakness [R53.1] Dural venous sinus thrombosis [G08] Acute embolism and thrombosis of right subclavian vein (HCC) [P17.B11] Transverse sinus thrombosis [G08] Internal jugular vein thrombosis, right (HCC) [P17.C11] 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] Cerebral venous thrombosis of sigmoid sinus [G08] Chest pain, unspecified type [R07.9] Patient Active Problem List   Diagnosis Date Noted   Acute respiratory distress 10/19/2023   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   Venous thrombosis 10/19/2023   Acute thrombosis of  right axillary vein (HCC) 10/19/2023   Subclavian vein thrombosis (HCC) 10/19/2023   History of lung cancer 10/19/2023   Left leg weakness 10/19/2023   Mediastinal lymphadenopathy 10/19/2023   Cavitary lesion of lung 10/19/2023    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   Aortic stenosis 11/10/2021   Stroke (HCC) 11/08/2021   CAD (coronary artery disease) 11/08/2021   Microcytic anemia 11/08/2021   Asthma, chronic 11/08/2021   HTN (hypertension) 11/08/2021   HLD (hyperlipidemia) 11/08/2021   Elevated troponin    NSTEMI (non-ST elevated myocardial infarction) (HCC)    Polysubstance abuse (HCC)    Anxiety state    Atypical chest pain 10/30/2014   Smoking 10/30/2014   Chest pain 10/30/2014   Homicidal ideation    MALIGNANT NEOPLASM BRONCHUS&LUNG UNSPEC SITE 07/11/2007   Morbid obesity (HCC) 07/11/2007   TOBACCO ABUSE-HISTORY OF 07/11/2007   Cavitating mass in right upper lung lobe 06/07/2007   PCP:  [facility]:   [id number] - [place], [place] - [address] AT [address] [place] [address] Phone: [phone] Fax: [phone]  [facility] [address] [place] [address] Phone: [phone] Fax: [phone]     Social Drivers of Health (SDOH) Social History: SDOH Screenings   Food Insecurity: No Food Insecurity (10/19/2023)  Recent Concern: Food Insecurity - Food Insecurity Present (10/05/2023)   Received from [facility]  Housing: Low Risk  (10/19/2023)  Recent Concern: Housing - High Risk (10/05/2023)   Received from [facility]  Transportation Needs: No Transportation Needs (10/19/2023)  Utilities: Not At Risk (10/19/2023)  Depression (PHQ2-9): Low Risk  (12/11/2021)  Financial Resource Strain: Medium Risk (10/05/2023)   Received from [facility]  Physical Activity: Inactive (10/05/2023)   Received from [facility]  Social Connections: Somewhat Isolated (10/05/2023)   Received from [facility]  Stress: Stress Concern Present (10/05/2023)   Received from [facility]  Tobacco Use: High Risk (10/19/2023)   SDOH Interventions:     Readmission Risk Interventions     No data to display

## 2023-10-21 NOTE — Progress Notes (Addendum)
 STROKE TEAM PROGRESS NOTE   INTERIM HISTORY/SUBJECTIVE No family at the bedside. Patient is sitting in the chair in NAD. She remains on heparin  gtt with plans for bronch and biopsy tomorrow  No new neurological changes  CBC    Component Value Date/Time   WBC 4.8 10/21/2023 0244   RBC 3.70 (L) 10/21/2023 0244   HGB 10.1 (L) 10/21/2023 0244   HCT 32.0 (L) 10/21/2023 0244   PLT 185 10/21/2023 0244   MCV 86.5 10/21/2023 0244   MCH 27.3 10/21/2023 0244   MCHC 31.6 10/21/2023 0244   RDW 14.1 10/21/2023 0244   LYMPHSABS 1.0 10/18/2023 1630   MONOABS 0.6 10/18/2023 1630   EOSABS 0.1 10/18/2023 1630   BASOSABS 0.0 10/18/2023 1630    BMET    Component Value Date/Time   NA 134 (L) 10/20/2023 0246   K 3.9 10/20/2023 0246   CL 102 10/20/2023 0246   CO2 23 10/20/2023 0246   GLUCOSE 95 10/20/2023 0246   BUN 10 10/20/2023 0246   CREATININE 0.67 10/20/2023 0246   CREATININE 0.72 04/03/2021 0000   CALCIUM  8.9 10/20/2023 0246   EGFR 99 04/03/2021 0000   GFRNONAA >60 10/20/2023 0246    IMAGING past 24 hours ECHOCARDIOGRAM COMPLETE Result Date: 10/20/2023    ECHOCARDIOGRAM REPORT   Patient Name:   Andrea Mullen Date of Exam: 10/20/2023 Medical Rec #:  [id number]        Height:       65.0 in Accession #:    [id number]       Weight:       216.3 lb Date of Birth:  1965-10-30        BSA:          2.045 m Patient Age:    57 years         BP:           104/73 mmHg Patient Gender: F                HR:           105 bpm. Exam Location:  Inpatient Procedure: 2D Echo, Cardiac Doppler and Color Doppler (Both Spectral and Color            Flow Doppler were utilized during procedure). Indications:    Pulmonary Embolus  History:        Patient has prior history of Echocardiogram examinations, most                 recent 11/09/2021. CAD, Stroke, Signs/Symptoms:Chest Pain; Risk                 Factors:Hypertension and Current Smoker.  Sonographer:    Juliene Rucks Referring Phys: [phone] RONDELL A SMITH  IMPRESSIONS  1. Left ventricular ejection fraction, by estimation, is 60 to 65%. The left ventricle has normal function. The left ventricle has no regional wall motion abnormalities. Left ventricular diastolic parameters are consistent with Grade I diastolic dysfunction (impaired relaxation). Elevated left ventricular end-diastolic pressure.  2. Right ventricular systolic function was not well visualized. The right ventricular size is not well visualized.  3. The mitral valve is degenerative. Trivial mitral valve regurgitation. Moderate to severe mitral annular calcification.  4. The aortic valve was not adequately interrogated to determine stenosis - there is calcification of the valve and aortic root. Image 67 demonstrates an aortic valve gradient of at least 30 mmHg peak and 14 mmHg, mean.. The aortic valve has an indeterminant number of  cusps. There is severe calcifcation of the aortic valve. Aortic valve regurgitation is trivial. Moderate to severe aortic valve stenosis.  5. The inferior vena cava is normal in size with greater than 50% respiratory variability, suggesting right atrial pressure of 3 mmHg. Comparison(s): Changes from prior study are noted. 11/09/2021: LVEF 60-65%, severe AS - possibly bicuspid aortic valve. Conclusion(s)/Recommendation(s): Consider further evaluation of the aortic valve with TEE. FINDINGS  Left Ventricle: Left ventricular ejection fraction, by estimation, is 60 to 65%. The left ventricle has normal function. The left ventricle has no regional wall motion abnormalities. The left ventricular internal cavity size was normal in size. There is  no left ventricular hypertrophy. Left ventricular diastolic parameters are consistent with Grade I diastolic dysfunction (impaired relaxation). Elevated left ventricular end-diastolic pressure. Right Ventricle: The right ventricular size is not well visualized. Right vetricular wall thickness was not well visualized. Right ventricular systolic  function was not well visualized. Left Atrium: Left atrial size was normal in size. Right Atrium: Right atrial size was normal in size. Pericardium: There is no evidence of pericardial effusion. Mitral Valve: The mitral valve is degenerative in appearance. Moderate to severe mitral annular calcification. Trivial mitral valve regurgitation. Tricuspid Valve: The tricuspid valve is not well visualized. Tricuspid valve regurgitation is not demonstrated. Aortic Valve: The aortic valve was not adequately interrogated to determine stenosis - there is calcification of the valve and aortic root. Image 67 demonstrates an aortic valve gradient of at least 30 mmHg peak and 14 mmHg, mean. The aortic valve has an  indeterminant number of cusps. There is severe calcifcation of the aortic valve. There is moderate aortic valve annular calcification. Aortic valve regurgitation is trivial. Moderate to severe aortic stenosis is present. Pulmonic Valve: The pulmonic valve was not well visualized. Pulmonic valve regurgitation is not visualized. Aorta: The aortic root and ascending aorta are structurally normal, with no evidence of dilitation. Venous: The inferior vena cava is normal in size with greater than 50% respiratory variability, suggesting right atrial pressure of 3 mmHg. IAS/Shunts: The interatrial septum was not well visualized.  LEFT VENTRICLE PLAX 2D LVIDd:         5.20 cm   Diastology LVIDs:         4.60 cm   LV e' medial:    4.24 cm/s LV PW:         0.80 cm   LV E/e' medial:  30.2 LV IVS:        0.90 cm   LV e' lateral:   7.07 cm/s LVOT diam:     1.70 cm   LV E/e' lateral: 18.1 LVOT Area:     2.27 cm  RIGHT VENTRICLE RV S prime:     5.34 cm/s LEFT ATRIUM           Index LA diam:      3.00 cm 1.47 cm/m LA Vol (A2C): 48.1 ml 23.52 ml/m LA Vol (A4C): 40.9 ml 20.00 ml/m   AORTA Ao Root diam: 2.60 cm MITRAL VALVE MV Area (PHT): 4.39 cm     SHUNTS MV Decel Time: 173 msec     Systemic Diam: 1.70 cm MV E velocity: 128.00 cm/s  MV A velocity: 162.00 cm/s MV E/A ratio:  0.79 Vinie Maxcy MD Electronically signed by Vinie Maxcy MD Signature Date/Time: 10/20/2023/6:31:01 PM    Final    VAS US  LOWER EXTREMITY VENOUS (DVT) Result Date: 10/20/2023  Lower Venous DVT Study Patient Name:  Andrea Mullen  Date of  Exam:   10/20/2023 Medical Rec #: [id number]         Accession #:    [id number] Date of Birth: Jun 30, 1965         Patient Gender: F Patient Age:   6 years Exam Location:  [facility] Procedure:      VAS US  LOWER EXTREMITY VENOUS (DVT) Referring Phys: MAXIMINO SHARPS --------------------------------------------------------------------------------  Indications: Pulmonary embolism.  Performing Technologist: Elmarie Lindau, RVT  Examination Guidelines: A complete evaluation includes B-mode imaging, spectral Doppler, color Doppler, and power Doppler as needed of all accessible portions of each vessel. Bilateral testing is considered an integral part of a complete examination. Limited examinations for reoccurring indications may be performed as noted. The reflux portion of the exam is performed with the patient in reverse Trendelenburg.  +---------+---------------+---------+-----------+----------+--------------+ RIGHT    CompressibilityPhasicitySpontaneityPropertiesThrombus Aging +---------+---------------+---------+-----------+----------+--------------+ CFV      Full           Yes      Yes                                 +---------+---------------+---------+-----------+----------+--------------+ SFJ      Full                                                        +---------+---------------+---------+-----------+----------+--------------+ FV Prox  Full                                                        +---------+---------------+---------+-----------+----------+--------------+ FV Mid   Full                                                         +---------+---------------+---------+-----------+----------+--------------+ FV DistalFull                                                        +---------+---------------+---------+-----------+----------+--------------+ PFV      Full                                                        +---------+---------------+---------+-----------+----------+--------------+ POP      Partial                                      Chronic        +---------+---------------+---------+-----------+----------+--------------+ PTV      Full                                                        +---------+---------------+---------+-----------+----------+--------------+  PERO     Full                                                        +---------+---------------+---------+-----------+----------+--------------+   +---------+---------------+---------+-----------+----------+--------------+ LEFT     CompressibilityPhasicitySpontaneityPropertiesThrombus Aging +---------+---------------+---------+-----------+----------+--------------+ CFV      Full           Yes      Yes                                 +---------+---------------+---------+-----------+----------+--------------+ SFJ      Full                                                        +---------+---------------+---------+-----------+----------+--------------+ FV Prox  Full                                                        +---------+---------------+---------+-----------+----------+--------------+ FV Mid   Full                                                        +---------+---------------+---------+-----------+----------+--------------+ FV DistalFull                                                        +---------+---------------+---------+-----------+----------+--------------+ PFV      Full                                                         +---------+---------------+---------+-----------+----------+--------------+ POP      Partial                                      Chronic        +---------+---------------+---------+-----------+----------+--------------+ PTV      Full                                                        +---------+---------------+---------+-----------+----------+--------------+ PERO     Full                                                        +---------+---------------+---------+-----------+----------+--------------+  Summary: RIGHT: - Findings consistent 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  - No cystic structure found in the popliteal fossa.  LEFT: - Findings consistent with chronic deep vein thrombosis involving the left popliteal vein.  - No cystic structure found in the popliteal fossa.  *See table(s) above for measurements and observations. Electronically signed by Lonni Gaskins MD on 10/20/2023 at 2:53:16 PM.    Final     Vitals:   10/20/23 1939 10/20/23 2342 10/21/23 0323 10/21/23 0725  BP: 97/68 108/66 (!) 111/99 98/63  Pulse:  90 100   Resp: 14 20 20  (!) 21  Temp: 98 F (36.7 C) 97.6 F (36.4 C) 97.7 F (36.5 C) 98.4 F (36.9 C)  TempSrc: Oral Oral Oral Oral  SpO2: 100% 96% 99% 96%  Weight:      Height:         PHYSICAL EXAM General:  Alert, well-nourished, well-developed patient in no acute distress Psych:  Mood and affect appropriate for situation CV: Regular rate and rhythm on monitor Respiratory:  Regular, unlabored respirations on room air GI: Abdomen soft and nontender   NEURO:  Mental Status: AA&Ox3, patient is able to give clear and coherent history Speech/Language: speech is without dysarthria or aphasia.  Naming, repetition, fluency, and comprehension intact.  Cranial Nerves:  II: PERRL. Visual fields full.  III, IV, VI: EOMI. Eyelids elevate symmetrically.  V: Sensation is intact to light touch and symmetrical to  face.  VII: Face is symmetrical resting and smiling VIII: hearing intact to voice. IX, X: Palate elevates symmetrically. Phonation is normal.  KP:Dynloizm shrug 5/5. XII: tongue is midline without fasciculations. Motor: 5/5 strength in bilateral uppers and right lower right leg 4/5 with drift Tone: is normal and bulk is normal Sensation-creased sensation on left Coordination: FTN intact bilaterally, HKS: no ataxia in BLE.No drift.  Gait- deferred  Most Recent NIH  1a Level of Conscious.:  1b LOC Questions:  1c LOC Commands:  2 Best Gaze:  3 Visual:  4 Facial Palsy:  5a Motor Arm - left:  5b Motor Arm - Right:  6a Motor Leg - Left: 1 6b Motor Leg - Right:  7 Limb Ataxia:  8 Sensory: 1 9 Best Language:  10 Dysarthria:  11 Extinct. and Inatten.:  TOTAL: 2   ASSESSMENT/PLAN  Andrea Mullen is a 58 y.o. 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  She had workup which demonstrated thrombus extending from distal half of the right transverse sinus, right sigmoid sinus and into the right internal jugular and extending into brachiocephalic vein, and a nonocclusive thrombus in the superior vena cava. NIH on Admission 2  Acute Ischemic Infarct:  right splenium and pons  Etiology: Small vessel disease versus cancer related hypercoagulability  CVST  of right transverse and sigmoid sinuses extending into the right jugular bulb and visualized proximal right internal jugular vein etiology likely due to hypercoagulable state from Cancer  CT head  Filling defect within the right transverse and sigmoid sinuses extending into the right jugular bulb and visualized proximal right internal jugular vein, consistent with dural venous sinus thrombosis. This  extends inferiorly throughout the right IJ within the neck.  cerebral atrophy with moderate chronic small vessel ischemic disease. remote lacunar  infarcts about the bilateral basal ganglia and right thalamus CTV Stable Thrombosis of the right IJ, right sigmoid sinus, and lateral half of the right transverse dural venous sinus MRI  6 mm acute ischemic nonhemorrhagic infarct involving the right   splenium. Acute dural venous sinus thrombosis involving the right transverse and sigmoid sinuses with extension into the right jugular. 6 mm focus of enhancement involving the right pons  2D Echo EF 60-65%. LV with grade I diastolic dysfunction. Moderate to severe mitral annular calcification.  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 LDL 182 HgbA1c 5.3 VTE prophylaxis - heparin  IV  No antithrombotic prior to admission, now on heparin  IV Will need to transition to Warfarin or Lovenox , depending on hem/onc decision  Neurology follow up in 8 weeks after discharge  Therapy recommendations:  Pending Disposition:  Pending   History of stroke remote lacunar infarcts about the bilateral basal ganglia and right thalamus  Hypertension Diastolic heart failure RV dysfunction Severe aortic stenosis Home meds: None Stable Blood Pressure Goal: SBP less than 160   Hyperlipidemia Home meds: Crestor  40 mg, resumed in hospital patient noncompliant with medication LDL 182, goal < 70 Continue statin at discharge  History of small cell lung cancer New mass in right upper lobe Segmental pulmonary emboli  Occlusive thrombus in brachiocephalic vein, nonocclusive thrombus in SVC , azygous, and extensive nonocclusive thormbus within the right axillary and right subclavian vein. On heparin  IV.  Will need to consider transition to likely Coumadin  Hypercoagulable due to concerning right lung mass for density CCM consulted for concern for malignancy  Tobacco Abuse Patient smokes cigarettes daily half a  pack per day       Ready to quit? No Nicotine  replacement therapy provided  Substance Abuse Patient with history of cocaine  and marijuana use UDS ordered      Ready to quit? No TOC consult for cessation placed  Per primary team  Dysphagia Patient has post-stroke dysphagia, SLP consulted    Diet   Diet Heart Room service appropriate? Yes; Fluid consistency: Thin   Advance diet as tolerated  Other Stroke Risk Factors  ETOH use, alcohol level <10, advised to drink no more than 1 drink(s) a day  Obesity, Body mass index is 35.99 kg/m., BMI >/= 30 associated with increased stroke risk, recommend weight loss, diet and exercise as appropriate     Other Active Problems Anemia  Neurology will sign off. Please call with questions or concerns  Hospital day # 2   Karna Geralds DNP, ACNPC-AG  [facility]  I have personally obtained history,examined this patient, reviewed notes, independently viewed imaging studies, participated in medical decision making and plan of care.ROS completed by me personally and pertinent positives fully documented  I have made any additions or clarifications directly to the above note. Agree with note above.  Continue IV heparin  and transition to oral warfarin or full dose Lovenox  after bronchoscopic biopsy tomorrow.  Follow-up as an outpatient stroke clinic in 2 to 3 months.  Stroke team will sign off.  Kindly call for questions.  Discussed with Dr. Nilda   I personally spent a total of  35 minutes in the care of the patient today including getting/reviewing separately obtained history, performing a medically appropriate exam/evaluation, counseling and educating, placing orders, referring and communicating with other health care professionals, documenting clinical information in the EHR, independently interpreting results, and coordinating care.        Eather Popp, MD Medical Director [facility]  Pager: [phone] 10/21/2023  12:12 PM    To contact Stroke Continuity provider, please refer to [email or web address]. After hours, contact General Neurology

## 2023-10-21 NOTE — H&P (View-Only) (Signed)
 NAME:  Andrea Mullen, MRN:  [id number], DOB:  09-06-1965, LOS: 2 ADMISSION DATE:  10/18/2023, CONSULTATION DATE:  10/19/23 REFERRING MD:  Claudene , CHIEF COMPLAINT:  abnormal Ct chest    History of Present Illness:  58 yo F PMH prior small cell lung cancer tx at [facility], Severe Aortic stenosis, dHF + RV dysfunction HTN, HLD, Lung nodules, active tobacco use  which look to be lost to follow up since 2023 who presented to ED 10/18/23 w chest tightness. Of note, was in ED 9/4 w SOB and at that time was found to have a cavitary RUL lung mass + bulky lymphadenopathy c/f malignancy which aligns with location of prior RUL nodule in 2023. 9/4 imaging did not have evidence of a PE. A referral was made to oncology and she was dc home.   On presentation 9/8 she had repeat CTA chest which shows previously noted cavitary mass + bulky lymphadenopathy as well segmental and subsegmental PE + nonocclusive SVC thrombus + nonocclusive thrombus R azygous vein + nonocclusive thrombus in R axillary vein + nonocclusive R>L subclavian thrombus + occlusive thrombus in R brachiocephalic vein.  The patient also underwent CT H CT soft tissue neck, and was found to have subocclusive thrombus R internal jugular into R transverse and sigmoid sinuses, concerning for dural venous sinus thrombosis.  She was started on hep gtt, accepted in admission to [facility] with [facility] and neuro to consult. Arrived at [facility] 9/9  PCCM is consulted on her arrival for evaluation of her abnormal CT chest, c/f malignancy   Pertinent  Medical History  Squamous cell lung cancer HTN HLD R eye blindness  RUL lung nodule  Tobacco use  Hx AS  Prior stroke  RV dysfunction Diastolic HF  Severe Aortic stenosis   Significant Hospital Events: Including procedures, antibiotic start and stop dates in addition to other pertinent events   9/8 ED presentation. RUL mass PE RUE DVT dural venous sinus thrombosis  9/9 arrives to [facility]   Interim History / Subjective:   Denies  chest pain or dyspnea Able to ambulate with PT  Objective    Blood pressure 98/63, pulse 100, temperature 98.4 F (36.9 C), temperature source Oral, resp. rate (!) 21, height 5' 5 (1.651 m), weight 98.1 kg, last menstrual period 11/16/2016, SpO2 96%.        Intake/Output Summary (Last 24 hours) at 10/21/2023 1330 Last data filed at 10/21/2023 0851 Gross per 24 hour  Intake --  Output 650 ml  Net -650 ml   Filed Weights   10/19/23 1456  Weight: 98.1 kg    Examination: General: chronically ill adult F NAD  HENT: NCAT  Lungs: Clear breath sounds bilateral, no accessory muscle use Cardiovascular: S1-S2 regular, ESM 2/6 at base Abdomen: soft  Extremities: edematous appearing  Neuro: Awake oriented x 3  GU: defer   Resolved problem list   Assessment and Plan   RUL cavitary mass R hilar/mediastinal lymphadenopathy Hx RUL lung nodules Hx prior lung cancer (2007, tx at [facility] w chemo rad)  P -Discussed bronchoscopy/EBUS with needle biopsy -will also plan for brushings from cavity and perhaps transbronchial biopsy if needed.  Plan for tomorrow a.m. 730, n.p.o. after midnight Risks and benefits of the procedure including that of lung puncture, bleeding and general anesthesia were discussed   Dural venous sinus thrombosis  PE SVC thrombus RUE DVT  -R>L subclavian, R axillary, azygous, + occlusive R brachiocephalic -Chronic bilateral popliteal DVT P -hep gtt to stop  at midnight - Can involve radiation oncology after procedure   Mod- Severe AS Diastolic HF RV dysfunction P -ECHO as above  ?acute encephalopathy vs memory loss  Acute CVA -6 mm infarct in right pons Fq falls  -Seen by neurology/stroke team  I have discussed procedure with patient and family in detail   Labs   CBC: Recent Labs  Lab 10/18/23 1630 10/20/23 0246 10/21/23 0244  WBC 7.1  7.0 5.1 4.8  NEUTROABS 5.4  --   --   HGB 10.8*  10.8* 10.0* 10.1*  HCT 34.3*  33.8* 31.3* 32.0*  MCV 87.5   86.7 86.7 86.5  PLT 187  170 160 185    Basic Metabolic Panel: Recent Labs  Lab 10/18/23 1630 10/20/23 0246  NA 136 134*  K 3.8 3.9  CL 100 102  CO2 24 23  GLUCOSE 105* 95  BUN 14 10  CREATININE 0.71 0.67  CALCIUM  9.6 8.9   GFR: Estimated Creatinine Clearance: 91 mL/min (by C-G formula based on SCr of 0.67 mg/dL). Recent Labs  Lab 10/18/23 1630 10/20/23 0246 10/21/23 0244  WBC 7.1  7.0 5.1 4.8    Liver Function Tests: Recent Labs  Lab 10/18/23 1630  AST 21  ALT 13  ALKPHOS 76  BILITOT 0.5  PROT 7.8  ALBUMIN 3.9   No results for input(s): LIPASE, AMYLASE in the last 168 hours. No results for input(s): AMMONIA in the last 168 hours.  ABG No results found for: PHART, PCO2ART, PO2ART, HCO3, TCO2, ACIDBASEDEF, O2SAT   Coagulation Profile: No results for input(s): INR, PROTIME in the last 168 hours.  Cardiac Enzymes: No results for input(s): CKTOTAL, CKMB, CKMBINDEX, TROPONINI in the last 168 hours.  HbA1C: Hgb A1c MFr Bld  Date/Time Value Ref Range Status  11/08/2021 11:07 PM 5.3 4.8 - 5.6 % Final    Comment:    (NOTE) Pre diabetes:          5.7%-6.4%  Diabetes:              >6.4%  Glycemic control for   <7.0% adults with diabetes   10/30/2014 09:00 PM 5.9 (H) 4.8 - 5.6 % Final    Comment:    (NOTE)         Pre-diabetes: 5.7 - 6.4         Diabetes: >6.4         Glycemic control for adults with diabetes: <7.0     CBG: No results for input(s): GLUCAP in the last 168 hours.   Harden Staff MD. DEBE. [facility] Pager : [phone]  If no response to pager , please call [phone] until 7 pm After 7:00 pm call Elink  [phone]   10/21/2023, 1:30 PM

## 2023-10-21 NOTE — Progress Notes (Signed)
 PHARMACY - ANTICOAGULATION CONSULT NOTE  Pharmacy Consult for heparin  Indication: Dural venous thrombus  Allergies  Allergen Reactions   Bicillin C-R Hives   Penicillins Hives        Prednisone  Anaphylaxis    seizure   Nsaids Hives    Hives     Patient Measurements: Height: 5' 5 (165.1 cm) Weight: 98.1 kg (216 lb 4.3 oz) IBW/kg (Calculated) : 57 HEPARIN  DW (KG): 79.3  Vital Signs: Temp: 98.4 F (36.9 C) (09/11 0725) Temp Source: Oral (09/11 0725) BP: 98/63 (09/11 0725) Pulse Rate: 100 (09/11 0323)  Labs: Recent Labs    10/18/23 1630 10/19/23 0318 10/20/23 0246 10/21/23 0244 10/21/23 0947  HGB 10.8*  10.8*  --  10.0* 10.1*  --   HCT 34.3*  33.8*  --  31.3* 32.0*  --   PLT 187  170  --  160 185  --   HEPARINUNFRC  --    < > 0.39 0.77* 0.51  CREATININE 0.71  --  0.67  --   --    < > = values in this interval not displayed.    Estimated Creatinine Clearance: 91 mL/min (by C-G formula based on SCr of 0.67 mg/dL).   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  She is not on anticoagulation PTA.  9/9 MRI with acute infarct, therefor targeting lower heparin  level goal.  Heparin  level near therapeutic goal at 0.51 (goal 0.3-0.5).  Will not change.  CBC stable.  Noted plans for bronch / EBUS 9/12 AM.  Will stop heparin  at 1AM 9/12 as ordered.  Goal of Therapy:  Heparin  level 0.3-0.5 units/ml Monitor platelets by anticoagulation protocol: Yes   Plan:  -Continue heparin  at 1100 units/hr -Stop heparin  at 0100 9/12 -Follow-up antocoag plan after procedure on 9/12   [facility], Pharm.D., BCPS Clinical  Pharmacist Clinical phone for 10/21/2023 from 7:30-3:00 is [phone].  **Pharmacist phone directory can be found on [email or web address] listed under [facility].  10/21/2023 1:00 PM

## 2023-10-21 NOTE — Plan of Care (Signed)
  Problem: Education: Goal: Knowledge of General Education information will improve Description: Including pain rating scale, medication(s)/side effects and non-pharmacologic comfort measures Outcome: Progressing   Problem: Health Behavior/Discharge Planning: Goal: Ability to manage health-related needs will improve Outcome: Progressing   Problem: Clinical Measurements: Goal: Ability to maintain clinical measurements within normal limits will improve Outcome: Progressing Goal: Will remain free from infection Outcome: Progressing Goal: Diagnostic test results will improve Outcome: Progressing Goal: Respiratory complications will improve Outcome: Progressing Goal: Cardiovascular complication will be avoided Outcome: Progressing   Problem: Activity: Goal: Risk for activity intolerance will decrease Outcome: Progressing   Problem: Nutrition: Goal: Adequate nutrition will be maintained Outcome: Progressing   Problem: Coping: Goal: Level of anxiety will decrease Outcome: Progressing   Problem: Elimination: Goal: Will not experience complications related to bowel motility Outcome: Progressing Goal: Will not experience complications related to urinary retention Outcome: Progressing   Problem: Pain Managment: Goal: General experience of comfort will improve and/or be controlled Outcome: Progressing   Problem: Safety: Goal: Ability to remain free from injury will improve Outcome: Progressing   Problem: Skin Integrity: Goal: Risk for impaired skin integrity will decrease Outcome: Progressing   Problem: Education: Goal: Knowledge of disease or condition will improve Outcome: Progressing Goal: Knowledge of secondary prevention will improve (MUST DOCUMENT ALL) Outcome: Progressing Goal: Knowledge of patient specific risk factors will improve (DELETE if not current risk factor) Outcome: Progressing   Problem: Ischemic Stroke/TIA Tissue Perfusion: Goal: Complications of  ischemic stroke/TIA will be minimized Outcome: Progressing   Problem: Coping: Goal: Will verbalize positive feelings about self Outcome: Progressing Goal: Will identify appropriate support needs Outcome: Progressing   Problem: Health Behavior/Discharge Planning: Goal: Ability to manage health-related needs will improve Outcome: Progressing Goal: Goals will be collaboratively established with patient/family Outcome: Progressing   Problem: Self-Care: Goal: Ability to participate in self-care as condition permits will improve Outcome: Progressing Goal: Verbalization of feelings and concerns over difficulty with self-care will improve Outcome: Progressing Goal: Ability to communicate needs accurately will improve Outcome: Progressing   Problem: Nutrition: Goal: Risk of aspiration will decrease Outcome: Progressing Goal: Dietary intake will improve Outcome: Progressing   Problem: Education: Goal: Knowledge of disease or condition will improve Outcome: Progressing Goal: Knowledge of secondary prevention will improve (MUST DOCUMENT ALL) Outcome: Progressing Goal: Knowledge of patient specific risk factors will improve (DELETE if not current risk factor) Outcome: Progressing   Problem: Ischemic Stroke/TIA Tissue Perfusion: Goal: Complications of ischemic stroke/TIA will be minimized Outcome: Progressing   Problem: Coping: Goal: Will verbalize positive feelings about self Outcome: Progressing Goal: Will identify appropriate support needs Outcome: Progressing   Problem: Health Behavior/Discharge Planning: Goal: Ability to manage health-related needs will improve Outcome: Progressing Goal: Goals will be collaboratively established with patient/family Outcome: Progressing   Problem: Self-Care: Goal: Ability to participate in self-care as condition permits will improve Outcome: Progressing Goal: Verbalization of feelings and concerns over difficulty with self-care will  improve Outcome: Progressing Goal: Ability to communicate needs accurately will improve Outcome: Progressing   Problem: Nutrition: Goal: Risk of aspiration will decrease Outcome: Progressing Goal: Dietary intake will improve Outcome: Progressing

## 2023-10-21 NOTE — Progress Notes (Signed)
 PROGRESS NOTE  ARLETA OSTRUM FMW:[id number] DOB: 1965/07/18 DOA: 10/18/2023 PCP: [facility]   LOS: 2 days   Brief Narrative / Interim history: ALIVEA GLADSON is a 58 y.o. female with a history of hypertension, hyperlipidemia, lung cancer s/p radiation and chemotherapy, left eye blindness. Patient presented secondary to chest pain and dyspnea was found to have evidence of head/neck thrombosis, dural sinus thrombosis, acute stroke. Neurology and vascular surgery consulted. Patient started on heparin  IV. During workup, patient was also found to have evidence of a right cavitary lesion; pulmonology consulted with plan for bronchoscopy/EBUS.   Subjective / 24h Interval events: She is doing well this morning, eating breakfast.  Assesement and Plan: Principal problem Acute head/neck thrombus -on admission, imaging showed evidence of acute thrombus involving SVC, brachiocephalic vein, azygous vein, right axillary vein and right subclavian vein. Heparin  IV started for management. Vascular surgery consulted and recommended no surgical intervention at this time. -Continue Heparin  IV with eventual transition to outpatient anticoagulation regimen, neurology recommends Coumadin  which will probably have to be bridged with Lovenox    Acute problems Dural sinus thrombosis - Acute. Patient presented with a headache which has improved. Neurology consulted and following, continue anticoagulation  Cavitary lesion -patient presented with dyspnea, imaging showed right upper lung lesion measuring 4.4 cm x 4.8 cm x 4.6 cm. Complicated by history of small cell lung cancer. Concerning for malignant etiology. Pulmonology consulted and plan for bronchoscopy with EBUS on 9/12.   History of small cell lung cancer - Noted.   Left leg weakness - PT/OT consulted.   Acute stroke - MRI confirms a 6 mm acute ischemic nonhemorrhagic infarct involving the right splenium and a 6 mm focus of enhancement involving  the right pons.  Per neuro the etiology is likely small vessel disease   Aortic valve stenosis - Noted to be severe on Transthoracic Echocardiogram from 2023.  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  Scheduled Meds:  sodium chloride  flush  3 mL Intravenous Q12H   Continuous Infusions:  heparin  1,100 Units/hr (10/21/23 0407)   PRN Meds:.acetaminophen  **OR** acetaminophen , albuterol , HYDROcodone -acetaminophen   Current Outpatient Medications  Medication Instructions   albuterol  (PROVENTIL ) 2.5 mg, Nebulization, Every 6 hours PRN   albuterol  (VENTOLIN  HFA) 108 (90 Base) MCG/ACT inhaler 2 puffs, Inhalation, Every 4 hours PRN   butalbital -acetaminophen -caffeine  (FIORICET) 50-325-40 MG tablet 1-2 tablets, Oral, Every 6 hours PRN   rosuvastatin  (CRESTOR ) 40 mg, Oral, Daily   Vitamin D (Ergocalciferol) (DRISDOL) 50,000 Units, Weekly    Diet Orders (From admission, onward)     Start     Ordered   10/20/23 1034  Diet Heart Room service appropriate? Yes; Fluid consistency: Thin  Diet effective now       Question Answer Comment  Room service appropriate? Yes   Fluid consistency: Thin      10/20/23 1033            DVT prophylaxis:    Lab Results  Component Value Date   PLT 185 10/21/2023      Code Status: Full Code  Family Communication: no family at bedside   Status is: Inpatient Remains inpatient appropriate because: severity of illness  Level of care: Progressive  Consultants:  PCCM Neurology  Vascular surgery  Objective: Vitals:   10/20/23 1939 10/20/23 2342 10/21/23  9676 10/21/23 0725  BP: 97/68 108/66 (!) 111/99 98/63  Pulse:  90 100   Resp: 14 20 20  (!) 21  Temp: 98 F (36.7 C) 97.6 F (36.4 C) 97.7 F (36.5 C) 98.4 F (36.9 C)  TempSrc: Oral Oral Oral Oral  SpO2:  100% 96% 99% 96%  Weight:      Height:        Intake/Output Summary (Last 24 hours) at 10/21/2023 0931 Last data filed at 10/21/2023 0851 Gross per 24 hour  Intake --  Output 650 ml  Net -650 ml   Wt Readings from Last 3 Encounters:  10/19/23 98.1 kg  09/20/23 90.7 kg  06/04/22 108.3 kg    Examination:  Constitutional: NAD Eyes: no scleral icterus ENMT: Mucous membranes are moist.  Neck: normal, supple Respiratory: clear to auscultation bilaterally, no wheezing, no crackles.  Cardiovascular: Regular rate and rhythm, no murmurs / rubs / gallops. No LE edema.  Abdomen: non distended, no tenderness. Bowel sounds positive.  Musculoskeletal: no clubbing / cyanosis.   Data Reviewed: I have independently reviewed following labs and imaging studies   CBC Recent Labs  Lab 10/18/23 1630 10/20/23 0246 10/21/23 0244  WBC 7.1  7.0 5.1 4.8  HGB 10.8*  10.8* 10.0* 10.1*  HCT 34.3*  33.8* 31.3* 32.0*  PLT 187  170 160 185  MCV 87.5  86.7 86.7 86.5  MCH 27.6  27.7 27.7 27.3  MCHC 31.5  32.0 31.9 31.6  RDW 14.1  14.1 14.2 14.1  LYMPHSABS 1.0  --   --   MONOABS 0.6  --   --   EOSABS 0.1  --   --   BASOSABS 0.0  --   --     Recent Labs  Lab 10/18/23 1630 10/20/23 0246  NA 136 134*  K 3.8 3.9  CL 100 102  CO2 24 23  GLUCOSE 105* 95  BUN 14 10  CREATININE 0.71 0.67  CALCIUM  9.6 8.9  AST 21  --   ALT 13  --   ALKPHOS 76  --   BILITOT 0.5  --   ALBUMIN 3.9  --     ------------------------------------------------------------------------------------------------------------------ No results for input(s): CHOL, HDL, LDLCALC, TRIG, CHOLHDL, LDLDIRECT in the last 72 hours.  Lab Results  Component Value Date   HGBA1C 5.3 11/08/2021   ------------------------------------------------------------------------------------------------------------------ No results for input(s): TSH, T4TOTAL, T3FREE, THYROIDAB in the last 72 hours.  Invalid input(s):  FREET3  Cardiac Enzymes No results for input(s): CKMB, TROPONINI, MYOGLOBIN in the last 168 hours.  Invalid input(s): CK ------------------------------------------------------------------------------------------------------------------    Component Value Date/Time   BNP 103.2 (H) 10/14/2023 0835    CBG: No results for input(s): GLUCAP in the last 168 hours.  Recent Results (from the past 240 hours)  Resp panel by RT-PCR (RSV, Flu A&B, Covid) Anterior Nasal Swab     Status: None   Collection Time: 10/14/23  8:30 AM   Specimen: Anterior Nasal Swab  Result Value Ref Range Status   SARS Coronavirus 2 by RT PCR NEGATIVE NEGATIVE Final   Influenza A by PCR NEGATIVE NEGATIVE Final   Influenza B by PCR NEGATIVE NEGATIVE Final    Comment: (NOTE) The Xpert Xpress SARS-CoV-2/FLU/RSV plus assay is intended as an aid in the diagnosis of influenza from Nasopharyngeal swab specimens and should not be used as a sole basis for treatment. Nasal washings and aspirates are unacceptable for Xpert Xpress SARS-CoV-2/FLU/RSV testing.  Fact Sheet for Patients: BloggerCourse.com  Fact Sheet for Healthcare  Providers: SeriousBroker.it  This test is not yet approved or cleared by the [place]  FDA and has been authorized for detection and/or diagnosis of SARS-CoV-2 by FDA under an Emergency Use Authorization (EUA). This EUA will remain in effect (meaning this test can be used) for the duration of the COVID-19 declaration under Section 564(b)(1) of the Act, 21 U.S.C. section 360bbb-3(b)(1), unless the authorization is terminated or revoked.     Resp Syncytial Virus by PCR NEGATIVE NEGATIVE Final    Comment: (NOTE) Fact Sheet for Patients: BloggerCourse.com  Fact Sheet for Healthcare Providers: SeriousBroker.it  This test is not yet approved or cleared by the [place]  FDA  and has been authorized for detection and/or diagnosis of SARS-CoV-2 by FDA under an Emergency Use Authorization (EUA). This EUA will remain in effect (meaning this test can be used) for the duration of the COVID-19 declaration under Section 564(b)(1) of the Act, 21 U.S.C. section 360bbb-3(b)(1), unless the authorization is terminated or revoked.  Performed at [facility], [address], [place], [place] [address]   MRSA Next Gen by PCR, Nasal     Status: None   Collection Time: 10/19/23  1:55 PM   Specimen: Nasal Mucosa; Nasal Swab  Result Value Ref Range Status   MRSA by PCR Next Gen NOT DETECTED NOT DETECTED Final    Comment: (NOTE) The GeneXpert MRSA Assay (FDA approved for NASAL specimens only), is one component of a comprehensive MRSA colonization surveillance program. It is not intended to diagnose MRSA infection nor to guide or monitor treatment for MRSA infections. Test performance is not FDA approved in patients less than 22 years old. Performed at [facility], [address] [place], [place] [address]      Radiology Studies: ECHOCARDIOGRAM COMPLETE Result Date: 10/20/2023    ECHOCARDIOGRAM REPORT   Patient Name:   CHRISHAWN KRING Date of Exam: 10/20/2023 Medical Rec #:  [id number]        Height:       65.0 in Accession #:    [id number]       Weight:       216.3 lb Date of Birth:  03/13/1965        BSA:          2.045 m Patient Age:    57 years         BP:           104/73 mmHg Patient Gender: F                HR:           105 bpm. Exam Location:  Inpatient Procedure: 2D Echo, Cardiac Doppler and Color Doppler (Both Spectral and Color            Flow Doppler were utilized during procedure). Indications:    Pulmonary Embolus  History:        Patient has prior history of Echocardiogram examinations, most                 recent 11/09/2021. CAD, Stroke, Signs/Symptoms:Chest Pain; Risk                 Factors:Hypertension and Current Smoker.  Sonographer:    Juliene Rucks Referring Phys: [phone] RONDELL A SMITH IMPRESSIONS  1. Left ventricular ejection fraction, by estimation, is 60 to 65%. The left ventricle has normal function. The left ventricle has no regional wall motion abnormalities. Left ventricular diastolic parameters are consistent with  Grade I diastolic dysfunction (impaired relaxation). Elevated left ventricular end-diastolic pressure.  2. Right ventricular systolic function was not well visualized. The right ventricular size is not well visualized.  3. The mitral valve is degenerative. Trivial mitral valve regurgitation. Moderate to severe mitral annular calcification.  4. The aortic valve was not adequately interrogated to determine stenosis - there is calcification of the valve and aortic root. Image 67 demonstrates an aortic valve gradient of at least 30 mmHg peak and 14 mmHg, mean.. The aortic valve has an indeterminant number of cusps. There is severe calcifcation of the aortic valve. Aortic valve regurgitation is trivial. Moderate to severe aortic valve stenosis.  5. The inferior vena cava is normal in size with greater than 50% respiratory variability, suggesting right atrial pressure of 3 mmHg. Comparison(s): Changes from prior study are noted. 11/09/2021: LVEF 60-65%, severe AS - possibly bicuspid aortic valve. Conclusion(s)/Recommendation(s): Consider further evaluation of the aortic valve with TEE. FINDINGS  Left Ventricle: Left ventricular ejection fraction, by estimation, is 60 to 65%. The left ventricle has normal function. The left ventricle has no regional wall motion abnormalities. The left ventricular internal cavity size was normal in size. There is  no left ventricular hypertrophy. Left ventricular diastolic parameters are consistent with Grade I diastolic dysfunction (impaired relaxation). Elevated left ventricular end-diastolic pressure. Right Ventricle: The right ventricular size is not well visualized. Right vetricular wall thickness was  not well visualized. Right ventricular systolic function was not well visualized. Left Atrium: Left atrial size was normal in size. Right Atrium: Right atrial size was normal in size. Pericardium: There is no evidence of pericardial effusion. Mitral Valve: The mitral valve is degenerative in appearance. Moderate to severe mitral annular calcification. Trivial mitral valve regurgitation. Tricuspid Valve: The tricuspid valve is not well visualized. Tricuspid valve regurgitation is not demonstrated. Aortic Valve: The aortic valve was not adequately interrogated to determine stenosis - there is calcification of the valve and aortic root. Image 67 demonstrates an aortic valve gradient of at least 30 mmHg peak and 14 mmHg, mean. The aortic valve has an  indeterminant number of cusps. There is severe calcifcation of the aortic valve. There is moderate aortic valve annular calcification. Aortic valve regurgitation is trivial. Moderate to severe aortic stenosis is present. Pulmonic Valve: The pulmonic valve was not well visualized. Pulmonic valve regurgitation is not visualized. Aorta: The aortic root and ascending aorta are structurally normal, with no evidence of dilitation. Venous: The inferior vena cava is normal in size with greater than 50% respiratory variability, suggesting right atrial pressure of 3 mmHg. IAS/Shunts: The interatrial septum was not well visualized.  LEFT VENTRICLE PLAX 2D LVIDd:         5.20 cm   Diastology LVIDs:         4.60 cm   LV e' medial:    4.24 cm/s LV PW:         0.80 cm   LV E/e' medial:  30.2 LV IVS:        0.90 cm   LV e' lateral:   7.07 cm/s LVOT diam:     1.70 cm   LV E/e' lateral: 18.1 LVOT Area:     2.27 cm  RIGHT VENTRICLE RV S prime:     5.34 cm/s LEFT ATRIUM           Index LA diam:      3.00 cm 1.47 cm/m LA Vol (A2C): 48.1 ml 23.52 ml/m LA Vol (A4C): 40.9 ml 20.00  ml/m   AORTA Ao Root diam: 2.60 cm MITRAL VALVE MV Area (PHT): 4.39 cm     SHUNTS MV Decel Time: 173 msec      Systemic Diam: 1.70 cm MV E velocity: 128.00 cm/s MV A velocity: 162.00 cm/s MV E/A ratio:  0.79 Vinie Maxcy MD Electronically signed by Vinie Maxcy MD Signature Date/Time: 10/20/2023/6:31:01 PM    Final    VAS US  LOWER EXTREMITY VENOUS (DVT) Result Date: 10/20/2023  Lower Venous DVT Study Patient Name:  SHIVA KARIS  Date of Exam:   10/20/2023 Medical Rec #: [id number]         Accession #:    [id number] Date of Birth: [birth year]         Patient Gender: F Patient Age:   4 years Exam Location:  [facility] Procedure:      VAS US  LOWER EXTREMITY VENOUS (DVT) Referring Phys: MAXIMINO SHARPS --------------------------------------------------------------------------------  Indications: Pulmonary embolism.  Performing Technologist: Elmarie Lindau, RVT  Examination Guidelines: A complete evaluation includes B-mode imaging, spectral Doppler, color Doppler, and power Doppler as needed of all accessible portions of each vessel. Bilateral testing is considered an integral part of a complete examination. Limited examinations for reoccurring indications may be performed as noted. The reflux portion of the exam is performed with the patient in reverse Trendelenburg.  +---------+---------------+---------+-----------+----------+--------------+ RIGHT    CompressibilityPhasicitySpontaneityPropertiesThrombus Aging +---------+---------------+---------+-----------+----------+--------------+ CFV      Full           Yes      Yes                                 +---------+---------------+---------+-----------+----------+--------------+ SFJ      Full                                                        +---------+---------------+---------+-----------+----------+--------------+ FV Prox  Full                                                        +---------+---------------+---------+-----------+----------+--------------+ FV Mid   Full                                                         +---------+---------------+---------+-----------+----------+--------------+ FV DistalFull                                                        +---------+---------------+---------+-----------+----------+--------------+ PFV      Full                                                        +---------+---------------+---------+-----------+----------+--------------+  POP      Partial                                      Chronic        +---------+---------------+---------+-----------+----------+--------------+ PTV      Full                                                        +---------+---------------+---------+-----------+----------+--------------+ PERO     Full                                                        +---------+---------------+---------+-----------+----------+--------------+   +---------+---------------+---------+-----------+----------+--------------+ LEFT     CompressibilityPhasicitySpontaneityPropertiesThrombus Aging +---------+---------------+---------+-----------+----------+--------------+ CFV      Full           Yes      Yes                                 +---------+---------------+---------+-----------+----------+--------------+ SFJ      Full                                                        +---------+---------------+---------+-----------+----------+--------------+ FV Prox  Full                                                        +---------+---------------+---------+-----------+----------+--------------+ FV Mid   Full                                                        +---------+---------------+---------+-----------+----------+--------------+ FV DistalFull                                                        +---------+---------------+---------+-----------+----------+--------------+ PFV      Full                                                         +---------+---------------+---------+-----------+----------+--------------+ POP      Partial                                      Chronic        +---------+---------------+---------+-----------+----------+--------------+  PTV      Full                                                        +---------+---------------+---------+-----------+----------+--------------+ PERO     Full                                                        +---------+---------------+---------+-----------+----------+--------------+     Summary: RIGHT: - Findings consistent 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  - No cystic structure found in the popliteal fossa.  LEFT: - Findings consistent with chronic deep vein thrombosis involving the left popliteal vein.  - No cystic structure found in the popliteal fossa.  *See table(s) above for measurements and observations. Electronically signed by Lonni Gaskins MD on 10/20/2023 at 2:53:16 PM.    Final      Nilda Fendt, MD, PhD [facility]  Between 7 am - 7 pm I am available, please contact me via Amion (for emergencies) or Securechat (non urgent messages)  Between 7 pm - 7 am I am not available, please contact night coverage MD/APP via Amion

## 2023-10-21 NOTE — Progress Notes (Signed)
 NAME:  Andrea Mullen, MRN:  [id number], DOB:  09-06-1965, LOS: 2 ADMISSION DATE:  10/18/2023, CONSULTATION DATE:  10/19/23 REFERRING MD:  Claudene , CHIEF COMPLAINT:  abnormal Ct chest    History of Present Illness:  58 yo F PMH prior small cell lung cancer tx at [facility], Severe Aortic stenosis, dHF + RV dysfunction HTN, HLD, Lung nodules, active tobacco use  which look to be lost to follow up since 2023 who presented to ED 10/18/23 w chest tightness. Of note, was in ED 9/4 w SOB and at that time was found to have a cavitary RUL lung mass + bulky lymphadenopathy c/f malignancy which aligns with location of prior RUL nodule in 2023. 9/4 imaging did not have evidence of a PE. A referral was made to oncology and she was dc home.   On presentation 9/8 she had repeat CTA chest which shows previously noted cavitary mass + bulky lymphadenopathy as well segmental and subsegmental PE + nonocclusive SVC thrombus + nonocclusive thrombus R azygous vein + nonocclusive thrombus in R axillary vein + nonocclusive R>L subclavian thrombus + occlusive thrombus in R brachiocephalic vein.  The patient also underwent CT H CT soft tissue neck, and was found to have subocclusive thrombus R internal jugular into R transverse and sigmoid sinuses, concerning for dural venous sinus thrombosis.  She was started on hep gtt, accepted in admission to [facility] with [facility] and neuro to consult. Arrived at [facility] 9/9  PCCM is consulted on her arrival for evaluation of her abnormal CT chest, c/f malignancy   Pertinent  Medical History  Squamous cell lung cancer HTN HLD R eye blindness  RUL lung nodule  Tobacco use  Hx AS  Prior stroke  RV dysfunction Diastolic HF  Severe Aortic stenosis   Significant Hospital Events: Including procedures, antibiotic start and stop dates in addition to other pertinent events   9/8 ED presentation. RUL mass PE RUE DVT dural venous sinus thrombosis  9/9 arrives to [facility]   Interim History / Subjective:   Denies  chest pain or dyspnea Able to ambulate with PT  Objective    Blood pressure 98/63, pulse 100, temperature 98.4 F (36.9 C), temperature source Oral, resp. rate (!) 21, height 5' 5 (1.651 m), weight 98.1 kg, last menstrual period 11/16/2016, SpO2 96%.        Intake/Output Summary (Last 24 hours) at 10/21/2023 1330 Last data filed at 10/21/2023 0851 Gross per 24 hour  Intake --  Output 650 ml  Net -650 ml   Filed Weights   10/19/23 1456  Weight: 98.1 kg    Examination: General: chronically ill adult F NAD  HENT: NCAT  Lungs: Clear breath sounds bilateral, no accessory muscle use Cardiovascular: S1-S2 regular, ESM 2/6 at base Abdomen: soft  Extremities: edematous appearing  Neuro: Awake oriented x 3  GU: defer   Resolved problem list   Assessment and Plan   RUL cavitary mass R hilar/mediastinal lymphadenopathy Hx RUL lung nodules Hx prior lung cancer (2007, tx at [facility] w chemo rad)  P -Discussed bronchoscopy/EBUS with needle biopsy -will also plan for brushings from cavity and perhaps transbronchial biopsy if needed.  Plan for tomorrow a.m. 730, n.p.o. after midnight Risks and benefits of the procedure including that of lung puncture, bleeding and general anesthesia were discussed   Dural venous sinus thrombosis  PE SVC thrombus RUE DVT  -R>L subclavian, R axillary, azygous, + occlusive R brachiocephalic -Chronic bilateral popliteal DVT P -hep gtt to stop  at midnight - Can involve radiation oncology after procedure   Mod- Severe AS Diastolic HF RV dysfunction P -ECHO as above  ?acute encephalopathy vs memory loss  Acute CVA -6 mm infarct in right pons Fq falls  -Seen by neurology/stroke team  I have discussed procedure with patient and family in detail   Labs   CBC: Recent Labs  Lab 10/18/23 1630 10/20/23 0246 10/21/23 0244  WBC 7.1  7.0 5.1 4.8  NEUTROABS 5.4  --   --   HGB 10.8*  10.8* 10.0* 10.1*  HCT 34.3*  33.8* 31.3* 32.0*  MCV 87.5   86.7 86.7 86.5  PLT 187  170 160 185    Basic Metabolic Panel: Recent Labs  Lab 10/18/23 1630 10/20/23 0246  NA 136 134*  K 3.8 3.9  CL 100 102  CO2 24 23  GLUCOSE 105* 95  BUN 14 10  CREATININE 0.71 0.67  CALCIUM  9.6 8.9   GFR: Estimated Creatinine Clearance: 91 mL/min (by C-G formula based on SCr of 0.67 mg/dL). Recent Labs  Lab 10/18/23 1630 10/20/23 0246 10/21/23 0244  WBC 7.1  7.0 5.1 4.8    Liver Function Tests: Recent Labs  Lab 10/18/23 1630  AST 21  ALT 13  ALKPHOS 76  BILITOT 0.5  PROT 7.8  ALBUMIN 3.9   No results for input(s): LIPASE, AMYLASE in the last 168 hours. No results for input(s): AMMONIA in the last 168 hours.  ABG No results found for: PHART, PCO2ART, PO2ART, HCO3, TCO2, ACIDBASEDEF, O2SAT   Coagulation Profile: No results for input(s): INR, PROTIME in the last 168 hours.  Cardiac Enzymes: No results for input(s): CKTOTAL, CKMB, CKMBINDEX, TROPONINI in the last 168 hours.  HbA1C: Hgb A1c MFr Bld  Date/Time Value Ref Range Status  11/08/2021 11:07 PM 5.3 4.8 - 5.6 % Final    Comment:    (NOTE) Pre diabetes:          5.7%-6.4%  Diabetes:              >6.4%  Glycemic control for   <7.0% adults with diabetes   10/30/2014 09:00 PM 5.9 (H) 4.8 - 5.6 % Final    Comment:    (NOTE)         Pre-diabetes: 5.7 - 6.4         Diabetes: >6.4         Glycemic control for adults with diabetes: <7.0     CBG: No results for input(s): GLUCAP in the last 168 hours.   Harden Staff MD. DEBE. Short Pulmonary & Critical care Pager : [phone]  If no response to pager , please call [phone] until 7 pm After 7:00 pm call Elink  [phone]   10/21/2023, 1:30 PM

## 2023-10-21 NOTE — Anesthesia Preprocedure Evaluation (Signed)
 Anesthesia Evaluation  Patient identified by MRN, date of birth, ID band Patient awake    Reviewed: Allergy & Precautions, H&P , NPO status , Patient's Chart, lab work & pertinent test results  Airway Mallampati: II  TM Distance: >3 FB Neck ROM: Full    Dental no notable dental hx. (+) Teeth Intact, Dental Advisory Given   Pulmonary asthma , Current Smoker and Patient abstained from smoking.   Pulmonary exam normal breath sounds clear to auscultation       Cardiovascular Exercise Tolerance: Good hypertension, Pt. on medications + Past MI  + Valvular Problems/Murmurs AS  Rhythm:Regular Rate:Normal + Systolic murmurs    Neuro/Psych   Anxiety     CVA    GI/Hepatic negative GI ROS, Neg liver ROS,,,  Endo/Other  negative endocrine ROS    Renal/GU negative Renal ROS  negative genitourinary   Musculoskeletal   Abdominal   Peds  Hematology  (+) Blood dyscrasia, anemia   Anesthesia Other Findings   Reproductive/Obstetrics negative OB ROS                              Anesthesia Physical Anesthesia Plan  ASA: 4  Anesthesia Plan: General   Post-op Pain Management: Minimal or no pain anticipated   Induction: Intravenous  PONV Risk Score and Plan: 2 and Ondansetron , Dexamethasone and Midazolam   Airway Management Planned: Oral ETT  Additional Equipment: ClearSight  Intra-op Plan:   Post-operative Plan: Extubation in OR  Informed Consent: I have reviewed the patients History and Physical, chart, labs and discussed the procedure including the risks, benefits and alternatives for the proposed anesthesia with the patient or authorized representative who has indicated his/her understanding and acceptance.     Dental advisory given  Plan Discussed with: CRNA  Anesthesia Plan Comments:          Anesthesia Quick Evaluation

## 2023-10-21 NOTE — Progress Notes (Signed)
 PHARMACY - ANTICOAGULATION CONSULT NOTE  Pharmacy Consult for heparin  Indication: DVST  Labs: Recent Labs    10/18/23 1630 10/19/23 0318 10/19/23 1808 10/20/23 0246 10/21/23 0244  HGB 10.8*  10.8*  --   --  10.0* 10.1*  HCT 34.3*  33.8*  --   --  31.3* 32.0*  PLT 187  170  --   --  160 185  HEPARINUNFRC  --    < > 0.42 0.39 0.77*  CREATININE 0.71  --   --  0.67  --    < > = values in this interval not displayed.   Assessment: 57yo female supratherapeutic on heparin  after two levels at goal; no infusion issues or signs of bleeding per RN.  Goal of Therapy:  Heparin  level 0.3-0.5 units/ml   Plan:  Decrease heparin  infusion by 2 units/kgABW/hr to 1100 units/hr. Check level in 6 hours.   Marvetta Dauphin, PharmD, BCPS 10/21/2023 3:51 AM

## 2023-10-22 ENCOUNTER — Inpatient Hospital Stay (HOSPITAL_COMMUNITY): Payer: Self-pay | Admitting: Anesthesiology

## 2023-10-22 ENCOUNTER — Inpatient Hospital Stay (HOSPITAL_COMMUNITY)

## 2023-10-22 ENCOUNTER — Encounter (HOSPITAL_COMMUNITY)
Admission: EM | Disposition: A | Discharge status: Home / Self Care | Payer: Self-pay | Source: Home / Self Care | Attending: Internal Medicine

## 2023-10-22 ENCOUNTER — Encounter (HOSPITAL_COMMUNITY): Payer: Self-pay | Admitting: Internal Medicine

## 2023-10-22 DIAGNOSIS — I10 Essential (primary) hypertension: Secondary | ICD-10-CM

## 2023-10-22 DIAGNOSIS — R590 Localized enlarged lymph nodes: Secondary | ICD-10-CM

## 2023-10-22 DIAGNOSIS — G08 Intracranial and intraspinal phlebitis and thrombophlebitis: Secondary | ICD-10-CM | POA: Diagnosis not present

## 2023-10-22 DIAGNOSIS — J984 Other disorders of lung: Secondary | ICD-10-CM | POA: Diagnosis not present

## 2023-10-22 DIAGNOSIS — F17210 Nicotine dependence, cigarettes, uncomplicated: Secondary | ICD-10-CM | POA: Diagnosis not present

## 2023-10-22 DIAGNOSIS — I2510 Atherosclerotic heart disease of native coronary artery without angina pectoris: Secondary | ICD-10-CM

## 2023-10-22 HISTORY — PX: VIDEO BRONCHOSCOPY WITH ENDOBRONCHIAL ULTRASOUND: SHX6177

## 2023-10-22 LAB — CBC
HCT: 31 % — ABNORMAL LOW (ref 36.0–46.0)
Hemoglobin: 9.7 g/dL — ABNORMAL LOW (ref 12.0–15.0)
MCH: 27.6 pg (ref 26.0–34.0)
MCHC: 31.3 g/dL (ref 30.0–36.0)
MCV: 88.1 fL (ref 80.0–100.0)
Platelets: 206 K/uL (ref 150–400)
RBC: 3.52 MIL/uL — ABNORMAL LOW (ref 3.87–5.11)
RDW: 14.4 % (ref 11.5–15.5)
WBC: 4.1 K/uL (ref 4.0–10.5)
nRBC: 0 % (ref 0.0–0.2)

## 2023-10-22 LAB — COMPREHENSIVE METABOLIC PANEL WITH GFR
ALT: 16 U/L (ref 0–44)
AST: 17 U/L (ref 15–41)
Albumin: 3 g/dL — ABNORMAL LOW (ref 3.5–5.0)
Alkaline Phosphatase: 57 U/L (ref 38–126)
Anion gap: 12 (ref 5–15)
BUN: 11 mg/dL (ref 6–20)
CO2: 24 mmol/L (ref 22–32)
Calcium: 9 mg/dL (ref 8.9–10.3)
Chloride: 102 mmol/L (ref 98–111)
Creatinine, Ser: 0.71 mg/dL (ref 0.44–1.00)
GFR, Estimated: >60 mL/min (ref >60)
Glucose, Bld: 96 mg/dL (ref 70–99)
Potassium: 4.2 mmol/L (ref 3.5–5.1)
Sodium: 138 mmol/L (ref 135–145)
Total Bilirubin: 0.6 mg/dL (ref 0.0–1.2)
Total Protein: 7.1 g/dL (ref 6.5–8.1)

## 2023-10-22 LAB — MAGNESIUM: Magnesium: 2 mg/dL (ref 1.7–2.4)

## 2023-10-22 SURGERY — BRONCHOSCOPY, WITH EBUS
Anesthesia: General | Laterality: Right

## 2023-10-22 MED ORDER — PROPOFOL 10 MG/ML IV BOLUS
INTRAVENOUS | Status: DC | PRN
  Administered 2023-10-22: 100 mg via INTRAVENOUS
  Administered 2023-10-22: 30 mg via INTRAVENOUS

## 2023-10-22 MED ORDER — FENTANYL CITRATE (PF) 250 MCG/5ML IJ SOLN
INTRAMUSCULAR | Status: DC | PRN
  Administered 2023-10-22 (×2): 100 ug via INTRAVENOUS

## 2023-10-22 MED ORDER — SUGAMMADEX SODIUM 200 MG/2ML IV SOLN
INTRAVENOUS | Status: DC | PRN
  Administered 2023-10-22 (×2): 100 mg via INTRAVENOUS

## 2023-10-22 MED ORDER — LIDOCAINE 2% (20 MG/ML) 5 ML SYRINGE
INTRAMUSCULAR | Status: DC | PRN
  Administered 2023-10-22: 100 mg via INTRAVENOUS

## 2023-10-22 MED ORDER — GLYCOPYRROLATE PF 0.2 MG/ML IJ SOSY
PREFILLED_SYRINGE | INTRAMUSCULAR | Status: DC | PRN
  Administered 2023-10-22: .1 mg via INTRAVENOUS

## 2023-10-22 MED ORDER — METRONIDAZOLE 500 MG/100ML IV SOLN
500.0000 mg | Freq: Two times a day (BID) | INTRAVENOUS | Status: DC
  Administered 2023-10-22 – 2023-10-27 (×10): 500 mg via INTRAVENOUS
  Filled 2023-10-22 (×11): qty 100

## 2023-10-22 MED ORDER — FENTANYL CITRATE (PF) 100 MCG/2ML IJ SOLN
INTRAMUSCULAR | Status: AC
  Filled 2023-10-22: qty 2

## 2023-10-22 MED ORDER — SODIUM CHLORIDE 0.9 % IV SOLN
2.0000 g | INTRAVENOUS | Status: DC
  Administered 2023-10-22 – 2023-10-26 (×5): 2 g via INTRAVENOUS
  Filled 2023-10-22 (×6): qty 20

## 2023-10-22 MED ORDER — FENTANYL CITRATE (PF) 100 MCG/2ML IJ SOLN: 25.0000 ug | INTRAMUSCULAR | Status: DC | PRN

## 2023-10-22 MED ORDER — PHENOL 1.4 % MT LIQD
1.0000 | OROMUCOSAL | Status: DC | PRN
  Administered 2023-10-22: 1 via OROMUCOSAL
  Filled 2023-10-22: qty 177

## 2023-10-22 MED ORDER — SODIUM CHLORIDE 0.9 % IV SOLN: INTRAVENOUS | Status: DC | PRN

## 2023-10-22 MED ORDER — HEPARIN (PORCINE) 25000 UT/250ML-% IV SOLN
1100.0000 [IU]/h | INTRAVENOUS | Status: DC
  Administered 2023-10-22 – 2023-10-23 (×2): 1100 [IU]/h via INTRAVENOUS
  Filled 2023-10-22: qty 250

## 2023-10-22 MED ORDER — MORPHINE SULFATE (PF) 2 MG/ML IV SOLN
2.0000 mg | Freq: Once | INTRAVENOUS | Status: AC
  Administered 2023-10-22: 2 mg via INTRAVENOUS
  Filled 2023-10-22: qty 1

## 2023-10-22 MED ORDER — PHENYLEPHRINE HCL-NACL 20-0.9 MG/250ML-% IV SOLN: INTRAVENOUS | Status: DC | PRN

## 2023-10-22 MED ORDER — METOPROLOL TARTRATE 5 MG/5ML IV SOLN
2.0000 mg | Freq: Once | INTRAVENOUS | Status: AC
  Administered 2023-10-22: 2 mg via INTRAVENOUS

## 2023-10-22 MED ORDER — ROCURONIUM BROMIDE 10 MG/ML (PF) SYRINGE
PREFILLED_SYRINGE | INTRAVENOUS | Status: DC | PRN
  Administered 2023-10-22: 40 mg via INTRAVENOUS
  Administered 2023-10-22: 10 mg via INTRAVENOUS

## 2023-10-22 NOTE — Plan of Care (Signed)
  Problem: Education: Goal: Knowledge of General Education information will improve Description: Including pain rating scale, medication(s)/side effects and non-pharmacologic comfort measures Outcome: Progressing   Problem: Clinical Measurements: Goal: Will remain free from infection Outcome: Progressing   Problem: Nutrition: Goal: Adequate nutrition will be maintained Outcome: Progressing   Problem: Pain Managment: Goal: General experience of comfort will improve and/or be controlled Outcome: Progressing

## 2023-10-22 NOTE — Progress Notes (Signed)
 PHARMACY - ANTICOAGULATION CONSULT NOTE  Pharmacy Consult for heparin  Indication: Dural venous thrombus  Allergies  Allergen Reactions   Bicillin C-R Hives   Penicillins Hives        Prednisone  Anaphylaxis    seizure   Nsaids Hives    Hives     Patient Measurements: Height: 5' 5 (165.1 cm) Weight: 98.1 kg (216 lb 4.3 oz) IBW/kg (Calculated) : 57 HEPARIN  DW (KG): 79.3  Vital Signs: Temp: 98.9 F (37.2 C) (09/12 1547) Temp Source: Oral (09/12 1547) BP: 124/95 (09/12 1547) Pulse Rate: 102 (09/12 1555)  Labs: Recent Labs    10/20/23 0246 10/21/23 0244 10/21/23 0947 10/22/23 0422  HGB 10.0* 10.1*  --  9.7*  HCT 31.3* 32.0*  --  31.0*  PLT 160 185  --  206  HEPARINUNFRC 0.39 0.77* 0.51  --   CREATININE 0.67  --   --  0.71    Estimated Creatinine Clearance: 91 mL/min (by C-G formula based on SCr of 0.71 mg/dL).  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  She is not on anticoagulation PTA.  9/9 MRI with acute infarct, therefor targeting lower heparin  level goal.  Heparin  level near therapeutic goal at 0.51 (goal 0.3-0.5).  Will not change.  CBC stable.  Now s/p bronch / EBUS on 9/12. Per note from Dr Jude, may resume heparin  drip at 16:00 if no significant hemoptysis. Spoke with RN who reports no hemoptysis.  Goal of Therapy:  Heparin  level 0.3-0.5 units/ml Monitor platelets by anticoagulation protocol: Yes   Plan:  -Resume heparin  drip at 1100 units/hr - no bolus -6 hr heparin  level -Daily heparin  level, CBC -Monitor for s/sx of bleeding  Thank you for involving pharmacy in this patient's care.  Delon Sax, PharmD, BCPS Clinical Pharmacist Clinical phone for 10/22/2023 is [phone] 10/22/2023 4:02 PM

## 2023-10-22 NOTE — Anesthesia Postprocedure Evaluation (Signed)
 Anesthesia Post Note  Patient: Andrea Mullen  Procedure(s) Performed: BRONCHOSCOPY, WITH EBUS (Right)     Patient location during evaluation: PACU Anesthesia Type: General Level of consciousness: awake and alert Pain management: pain level controlled Vital Signs Assessment: post-procedure vital signs reviewed and stable Respiratory status: spontaneous breathing, nonlabored ventilation, respiratory function stable and patient connected to nasal cannula oxygen Cardiovascular status: blood pressure returned to baseline and stable Postop Assessment: no apparent nausea or vomiting Anesthetic complications: no   No notable events documented.  Last Vitals:  Vitals:   10/22/23 1000 10/22/23 1015  BP: 127/88   Pulse: 100 99  Resp: 11 14  Temp:    SpO2: 99% 98%    Last Pain:  Vitals:   10/22/23 0711  TempSrc: Temporal  PainSc: 8                  Rumeal Cullipher,W. EDMOND

## 2023-10-22 NOTE — Progress Notes (Signed)
 PT Cancellation Note  Patient Details Name: Andrea Mullen MRN: [id number] DOB: [birth year]   Cancelled Treatment:    Reason Eval/Treat Not Completed: Patient at procedure or test/unavailable   [name] [name] 10/22/2023, 7:34 AM Lenoard [name], PT Acute Rehabilitation Services Office: [phone]

## 2023-10-22 NOTE — Interval H&P Note (Signed)
 History and Physical Interval Note:  10/22/2023 7:32 AM  Andrea Mullen  has presented today for surgery, with the diagnosis of med lymphadenopathy.  The various methods of treatment have been discussed with the patient and family. After consideration of risks, benefits and other options for treatment, the patient has consented to  Procedure(s): BRONCHOSCOPY, WITH EBUS (Right) as a surgical intervention.  The patient's history has been reviewed, patient examined, no change in status, stable for surgery.  I have reviewed the patient's chart and labs.  Questions were answered to the patient's satisfaction.     Harden ROCKFORD Kriss Ishler

## 2023-10-22 NOTE — Progress Notes (Addendum)
 PROGRESS NOTE  Andrea Mullen FMW:[id number] DOB: 19-Jun-1965 DOA: 10/18/2023 PCP: [facility]   LOS: 3 days   Brief Narrative / Interim history: Andrea Mullen is a 58 y.o. female with a history of hypertension, hyperlipidemia, lung cancer s/p radiation and chemotherapy, left eye blindness. Patient presented secondary to chest pain and dyspnea was found to have evidence of head/neck thrombosis, dural sinus thrombosis, acute stroke. Neurology and vascular surgery consulted. Patient started on heparin  IV. During workup, patient was also found to have evidence of a right cavitary lesion; pulmonology consulted with plan for bronchoscopy/EBUS.   Subjective / 24h Interval events: Doing well this morning, denies any chest pain, denies any shortness of breath.  She is complaining of a headache.  Assesement and Plan: Principal problem Acute head/neck thrombus -on admission, imaging showed evidence of acute thrombus involving SVC, brachiocephalic vein, azygous vein, right axillary vein and right subclavian vein. Heparin  IV started for management. Vascular surgery consulted and recommended no surgical intervention at this time. -Continue Heparin  IV with eventual transition to outpatient anticoagulation regimen, neurology recommends Coumadin  bridged with Lovenox .  Underwent bronchoscopy this morning, there was some bleeding per Dr. Jude, he recommends holding anticoagulation for the next 6 hours.  Will start with heparin  afterwards, closely monitor for hemoptysis overnight, and if stable can start Lovenox /Coumadin  tomorrow   Acute problems Dural sinus thrombosis - Acute. Patient presented with a headache which has improved. Neurology consulted and following, continue anticoagulation.  Transition to Lovenox /Coumadin  tomorrow  Cavitary lesion -patient presented with dyspnea, imaging showed right upper lung lesion measuring 4.4 cm x 4.8 cm x 4.6 cm. Complicated by history of small cell lung  cancer. Concerning for malignant etiology. Pulmonology consulted, underwent bronchoscopy this morning.  Discussed with Dr. Jude, preliminary path results negative for malignancy - Pulmonary recommends IV antibiotics with Unasyn now, and continuation of IV antibiotics until bronchoscopy cultures result. She is pen allergic, so will cover with ceftriaxone  / metronidazole  after speaking with pharmacy   History of small cell lung cancer - Noted.  Bronchoscopy primary pathology negative, but final pathology is pending   Left leg weakness - PT/OT consulted.   Acute stroke - MRI confirms a 6 mm acute ischemic nonhemorrhagic infarct involving the right splenium and a 6 mm focus of enhancement involving the right pons.  Per neuro the etiology is likely small vessel disease   Aortic valve stenosis - Noted to be severe on Transthoracic Echocardiogram from 2023.  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  Scheduled Meds:  [MAR Hold] polyethylene glycol  17 g Oral Daily   [MAR Hold] senna-docusate  1 tablet Oral BID   [MAR Hold] sodium chloride  flush  3 mL Intravenous Q12H   Continuous Infusions:   PRN Meds:.[MAR Hold] acetaminophen  **OR** [MAR Hold] acetaminophen , [MAR Hold] albuterol , fentaNYL  (SUBLIMAZE ) injection, [MAR Hold] HYDROcodone -acetaminophen   Current Outpatient Medications  Medication Instructions   albuterol  (PROVENTIL ) 2.5 mg, Nebulization, Every 6 hours PRN   albuterol  (VENTOLIN  HFA) 108 (90 Base) MCG/ACT inhaler 2 puffs, Inhalation, Every 4 hours PRN   butalbital -acetaminophen -caffeine  (FIORICET) 50-325-40 MG tablet 1-2 tablets, Oral, Every 6 hours PRN   rosuvastatin  (CRESTOR ) 40 mg, Oral, Daily   Vitamin D (Ergocalciferol) (DRISDOL) 50,000 Units, Weekly    Diet Orders (From admission, onward)  Start     Ordered   10/22/23 0713  Diet NPO time specified  Diet effective midnight        10/22/23 9287            DVT prophylaxis:    Lab Results  Component Value Date   PLT 206 10/22/2023      Code Status: Full Code  Family Communication: no family at bedside   Status is: Inpatient Remains inpatient appropriate because: severity of illness  Level of care: Progressive  Consultants:  PCCM Neurology  Vascular surgery  Objective: Vitals:   10/22/23 0309 10/22/23 0711 10/22/23 0921 10/22/23 0930  BP: 109/75 126/74 110/77 (!) 120/93  Pulse: 85 94 (!) 107 (!) 118  Resp: 16 17 18 20   Temp: 97.7 F (36.5 C)  97.6 F (36.4 C)   TempSrc: Oral Temporal    SpO2: 97% 97% 94% 94%  Weight:      Height:        Intake/Output Summary (Last 24 hours) at 10/22/2023 0946 Last data filed at 10/22/2023 9073 Gross per 24 hour  Intake 1097.82 ml  Output --  Net 1097.82 ml   Wt Readings from Last 3 Encounters:  10/19/23 98.1 kg  09/20/23 90.7 kg  06/04/22 108.3 kg    Examination:  Constitutional: NAD Eyes: lids and conjunctivae normal, no scleral icterus ENMT: mmm Neck: normal, supple Respiratory: clear to auscultation bilaterally, no wheezing, no crackles. Normal respiratory effort.  Cardiovascular: Regular rate and rhythm, no murmurs / rubs / gallops. No LE edema. Abdomen: soft, no distention, no tenderness. Bowel sounds positive.   Data Reviewed: I have independently reviewed following labs and imaging studies   CBC Recent Labs  Lab 10/18/23 1630 10/20/23 0246 10/21/23 0244 10/22/23 0422  WBC 7.1  7.0 5.1 4.8 4.1  HGB 10.8*  10.8* 10.0* 10.1* 9.7*  HCT 34.3*  33.8* 31.3* 32.0* 31.0*  PLT 187  170 160 185 206  MCV 87.5  86.7 86.7 86.5 88.1  MCH 27.6  27.7 27.7 27.3 27.6  MCHC 31.5  32.0 31.9 31.6 31.3  RDW 14.1  14.1 14.2 14.1 14.4  LYMPHSABS 1.0  --   --   --   MONOABS 0.6  --   --   --   EOSABS 0.1  --   --   --   BASOSABS 0.0  --   --    --     Recent Labs  Lab 10/18/23 1630 10/20/23 0246 10/22/23 0422  NA 136 134* 138  K 3.8 3.9 4.2  CL 100 102 102  CO2 24 23 24   GLUCOSE 105* 95 96  BUN 14 10 11   CREATININE 0.71 0.67 0.71  CALCIUM  9.6 8.9 9.0  AST 21  --  17  ALT 13  --  16  ALKPHOS 76  --  57  BILITOT 0.5  --  0.6  ALBUMIN 3.9  --  3.0*  MG  --   --  2.0    ------------------------------------------------------------------------------------------------------------------ No results for input(s): CHOL, HDL, LDLCALC, TRIG, CHOLHDL, LDLDIRECT in the last 72 hours.  Lab Results  Component Value Date   HGBA1C 5.3 11/08/2021   ------------------------------------------------------------------------------------------------------------------ No results for input(s): TSH, T4TOTAL, T3FREE, THYROIDAB in the last 72 hours.  Invalid input(s): FREET3  Cardiac Enzymes No results for input(s): CKMB, TROPONINI, MYOGLOBIN in the last 168 hours.  Invalid input(s): CK ------------------------------------------------------------------------------------------------------------------    Component Value Date/Time   BNP 103.2 (H) 10/14/2023 0835    CBG:  No results for input(s): GLUCAP in the last 168 hours.  Recent Results (from the past 240 hours)  Resp panel by RT-PCR (RSV, Flu A&B, Covid) Anterior Nasal Swab     Status: None   Collection Time: 10/14/23  8:30 AM   Specimen: Anterior Nasal Swab  Result Value Ref Range Status   SARS Coronavirus 2 by RT PCR NEGATIVE NEGATIVE Final   Influenza A by PCR NEGATIVE NEGATIVE Final   Influenza B by PCR NEGATIVE NEGATIVE Final    Comment: (NOTE) The Xpert Xpress SARS-CoV-2/FLU/RSV plus assay is intended as an aid in the diagnosis of influenza from Nasopharyngeal swab specimens and should not be used as a sole basis for treatment. Nasal washings and aspirates are unacceptable for Xpert Xpress SARS-CoV-2/FLU/RSV testing.  Fact Sheet for  Patients: BloggerCourse.com  Fact Sheet for Healthcare Providers: SeriousBroker.it  This test is not yet approved or cleared by the [place]  FDA and has been authorized for detection and/or diagnosis of SARS-CoV-2 by FDA under an Emergency Use Authorization (EUA). This EUA will remain in effect (meaning this test can be used) for the duration of the COVID-19 declaration under Section 564(b)(1) of the Act, 21 U.S.C. section 360bbb-3(b)(1), unless the authorization is terminated or revoked.     Resp Syncytial Virus by PCR NEGATIVE NEGATIVE Final    Comment: (NOTE) Fact Sheet for Patients: BloggerCourse.com  Fact Sheet for Healthcare Providers: SeriousBroker.it  This test is not yet approved or cleared by the [place]  FDA and has been authorized for detection and/or diagnosis of SARS-CoV-2 by FDA under an Emergency Use Authorization (EUA). This EUA will remain in effect (meaning this test can be used) for the duration of the COVID-19 declaration under Section 564(b)(1) of the Act, 21 U.S.C. section 360bbb-3(b)(1), unless the authorization is terminated or revoked.  Performed at [facility], [address], [place], [place] [address]   MRSA Next Gen by PCR, Nasal     Status: None   Collection Time: 10/19/23  1:55 PM   Specimen: Nasal Mucosa; Nasal Swab  Result Value Ref Range Status   MRSA by PCR Next Gen NOT DETECTED NOT DETECTED Final    Comment: (NOTE) The GeneXpert MRSA Assay (FDA approved for NASAL specimens only), is one component of a comprehensive MRSA colonization surveillance program. It is not intended to diagnose MRSA infection nor to guide or monitor treatment for MRSA infections. Test performance is not FDA approved in patients less than 41 years old. Performed at [facility], [address] [place], [place] [address]   Surgical pcr  screen     Status: None   Collection Time: 10/21/23  8:25 PM   Specimen: Nasal Mucosa; Nasal Swab  Result Value Ref Range Status   MRSA, PCR NEGATIVE NEGATIVE Final   Staphylococcus aureus NEGATIVE NEGATIVE Final    Comment: (NOTE) The Xpert SA Assay (FDA approved for NASAL specimens in patients 84 years of age and older), is one component of a comprehensive surveillance program. It is not intended to diagnose infection nor to guide or monitor treatment. Performed at [facility], [address], [place], [place] [address]      Radiology Studies: No results found.    Nilda Fendt, MD, PhD [facility]  Between 7 am - 7 pm I am available, please contact me via Amion (for emergencies) or Securechat (non urgent messages)  Between 7 pm - 7 am I am not available, please contact night coverage MD/APP  via Amion

## 2023-10-22 NOTE — Progress Notes (Signed)
 NAME:  Andrea Mullen, MRN:  [id number], DOB:  1965/05/07, LOS: 3 ADMISSION DATE:  10/18/2023, CONSULTATION DATE:  10/19/23 REFERRING MD:  Claudene , CHIEF COMPLAINT:  abnormal Ct chest    History of Present Illness:  58 yo F PMH prior small cell lung cancer tx at [facility], Severe Aortic stenosis, dHF + RV dysfunction HTN, HLD, Lung nodules, active tobacco use  which look to be lost to follow up since 2023 who presented to ED 10/18/23 w chest tightness. Of note, was in ED 9/4 w SOB and at that time was found to have a cavitary RUL lung mass + bulky lymphadenopathy c/f malignancy which aligns with location of prior RUL nodule in 2023. 9/4 imaging did not have evidence of a PE. A referral was made to oncology and she was dc home.   On presentation 9/8 she had repeat CTA chest which shows previously noted cavitary mass + bulky lymphadenopathy as well segmental and subsegmental PE + nonocclusive SVC thrombus + nonocclusive thrombus R azygous vein + nonocclusive thrombus in R axillary vein + nonocclusive R>L subclavian thrombus + occlusive thrombus in R brachiocephalic vein.  The patient also underwent CT H CT soft tissue neck, and was found to have subocclusive thrombus R internal jugular into R transverse and sigmoid sinuses, concerning for dural venous sinus thrombosis.  She was started on hep gtt, accepted in admission to [facility] with [facility] and neuro to consult. Arrived at [facility] 9/9  PCCM is consulted on her arrival for evaluation of her abnormal CT chest, c/f malignancy   Pertinent  Medical History  Squamous cell lung cancer HTN HLD R eye blindness  RUL lung nodule  Tobacco use  Hx AS  Prior stroke  RV dysfunction Diastolic HF  Severe Aortic stenosis   Significant Hospital Events: Including procedures, antibiotic start and stop dates in addition to other pertinent events   9/8 ED presentation. RUL mass PE RUE DVT dural venous sinus thrombosis  9/9 arrives to [facility]   Interim History / Subjective:   Denies  chest pain or dyspnea Afebrile  Objective    Blood pressure 122/81, pulse 100, temperature 98.5 F (36.9 C), temperature source Oral, resp. rate 15, height 5' 5 (1.651 m), weight 98.1 kg, last menstrual period 11/16/2016, SpO2 96%.        Intake/Output Summary (Last 24 hours) at 10/22/2023 1330 Last data filed at 10/22/2023 9073 Gross per 24 hour  Intake 1097.82 ml  Output --  Net 1097.82 ml   Filed Weights   10/19/23 1456  Weight: 98.1 kg    Examination: General: chronically ill adult F NAD  HENT: NCAT  Lungs: Clear breath sounds bilateral, no accessory muscle use Cardiovascular: S1-S2 regular, ESM 2/6 at base Abdomen: soft  Extremities: edematous appearing  Neuro: Awake oriented x 3  GU: defer   Resolved problem list   Assessment and Plan   RUL cavitary mass R hilar/mediastinal lymphadenopathy Hx RUL lung nodules Hx prior lung cancer (2007, tx at [facility] w chemo rad)  P - Bronchoscopy performed today with TBNA of 4R lymph node, present negative - Washings and brushings taken from right upper lobe posterior segment - Start empiric antibiotic   Dural venous sinus thrombosis  PE SVC thrombus RUE DVT  -R>L subclavian, R axillary, azygous, + occlusive R brachiocephalic -Chronic bilateral popliteal DVT P - Resume heparin  drip at 4 PM if no significant hemoptysis    Mod- Severe AS Diastolic HF RV dysfunction P - Risks of surgery  were discussed  ?acute encephalopathy vs memory loss  Acute CVA -6 mm infarct in right pons Fq falls  -Seen by neurology/stroke team  Updated daughter in detail postprocedure PCCM will see again Monday, if negative biopsy results, she will need further follow-up with Dr. Shelah   Labs   CBC: Recent Labs  Lab 10/18/23 1630 10/20/23 0246 10/21/23 0244 10/22/23 0422  WBC 7.1  7.0 5.1 4.8 4.1  NEUTROABS 5.4  --   --   --   HGB 10.8*  10.8* 10.0* 10.1* 9.7*  HCT 34.3*  33.8* 31.3* 32.0* 31.0*  MCV 87.5  86.7 86.7 86.5  88.1  PLT 187  170 160 185 206    Basic Metabolic Panel: Recent Labs  Lab 10/18/23 1630 10/20/23 0246 10/22/23 0422  NA 136 134* 138  K 3.8 3.9 4.2  CL 100 102 102  CO2 24 23 24   GLUCOSE 105* 95 96  BUN 14 10 11   CREATININE 0.71 0.67 0.71  CALCIUM  9.6 8.9 9.0  MG  --   --  2.0   GFR: Estimated Creatinine Clearance: 91 mL/min (by C-G formula based on SCr of 0.71 mg/dL). Recent Labs  Lab 10/18/23 1630 10/20/23 0246 10/21/23 0244 10/22/23 0422  WBC 7.1  7.0 5.1 4.8 4.1    Liver Function Tests: Recent Labs  Lab 10/18/23 1630 10/22/23 0422  AST 21 17  ALT 13 16  ALKPHOS 76 57  BILITOT 0.5 0.6  PROT 7.8 7.1  ALBUMIN 3.9 3.0*   No results for input(s): LIPASE, AMYLASE in the last 168 hours. No results for input(s): AMMONIA in the last 168 hours.  ABG No results found for: PHART, PCO2ART, PO2ART, HCO3, TCO2, ACIDBASEDEF, O2SAT   Coagulation Profile: No results for input(s): INR, PROTIME in the last 168 hours.  Cardiac Enzymes: No results for input(s): CKTOTAL, CKMB, CKMBINDEX, TROPONINI in the last 168 hours.  HbA1C: Hgb A1c MFr Bld  Date/Time Value Ref Range Status  11/08/2021 11:07 PM 5.3 4.8 - 5.6 % Final    Comment:    (NOTE) Pre diabetes:          5.7%-6.4%  Diabetes:              >6.4%  Glycemic control for   <7.0% adults with diabetes   10/30/2014 09:00 PM 5.9 (H) 4.8 - 5.6 % Final    Comment:    (NOTE)         Pre-diabetes: 5.7 - 6.4         Diabetes: >6.4         Glycemic control for adults with diabetes: <7.0     CBG: No results for input(s): GLUCAP in the last 168 hours.   Harden Staff MD. DEBE.  Pulmonary & Critical care Pager : [phone]  If no response to pager , please call [phone] until 7 pm After 7:00 pm call Elink  [phone]   10/22/2023, 1:30 PM

## 2023-10-22 NOTE — Op Note (Signed)
  Name:  [name] MRN:  [id number] DOB:  07-01-1965  PROCEDURE NOTE  Procedure(s): Flexible bronchoscopy [phone]) Brushing (68376) of the RUL Bronchial alveolar lavage (68375) of the RUL Endobronchial ultrasound (68379) Transbronchial needle aspiration (68370) of the 4R LN   Indications:  Paratracheal/mediastinal lymphadenopathy.  Consent:  Written informed consent was obtained prior to the procedure. The risks of the procedure including coughing, bleeding and the small chance of lung puncture requiring chest tube were discussed in great detail. The benefits & alternatives including serial follow up were also discussed.  Anesthesia:  General endotracheal.  Procedure summary:  Appropriate equipment was assembled.  The patient was  identified as Andrea Mullen. Interim history obtained and brought to the operating room. Safety timeout was performed. The patient was placed supine on the operating table, airway established and general anesthesia administered by Anesthesia team.   After the appropriate level of anesthesia was assured, flexible video bronchoscope was lubricated and inserted through the endotracheal tube.    Airway examination was performed bilaterally to subsegmental level.  Copious clear secretions were noted, mucosa appeared normal and no endobronchial lesions were identified.  Endobronchial ultrasound video bronchoscope was then lubricated and inserted through the endotracheal tube. Surveillance of the mediastinal and and bilateral hilar lymph node stations was performed.  Pathologically enlarged lymph nodes were noted at 4R location enveloping SVC  Endobronchial ultrasound guided transbronchial needle aspiration of 4R  (passes x 5), was performed, after which EBUS bronchoscope was withdrawn. Initial bloody aspirate  Flexible video bronchoscope was used again to perform suctioning . Blood clot suctioned out & BAL & brushings obtained from RUL posterior segment   After ensuring hemostasis , the bronchoscope was withdrawn.  The patient was extubated in  endo and transferred to PACU. Post-procedure chest x-ray was ordered.  Specimens sent: Bronchial alveolar lavage specimen of the RUL for  microbiology and cytology. TBNA of 4R for cytology (prelim neg)  Complications:  No immediate complications were noted.  Hemodynamic parameters and oxygenation remained stable throughout the procedure.  Estimated blood loss:  Less then 15 mL.   Harden Staff MD. DEBE. [facility] Pager [phone] If no response call [phone]   10/22/2023 9:07 AM

## 2023-10-22 NOTE — Transfer of Care (Signed)
 Immediate Anesthesia Transfer of Care Note  Patient: Andrea Mullen  Procedure(s) Performed: BRONCHOSCOPY, WITH EBUS (Right)  Patient Location: PACU  Anesthesia Type:General  Level of Consciousness: awake  Airway & Oxygen Therapy: Patient Spontanous Breathing and Patient connected to face mask oxygen  Post-op Assessment: Report given to RN and Post -op Vital signs reviewed and stable  Post vital signs: Reviewed and stable  Last Vitals:  Vitals Value Taken Time  BP 110/77 10/22/23 09:21  Temp    Pulse 112 10/22/23 09:25  Resp 17 10/22/23 09:25  SpO2 97 % 10/22/23 09:25  Vitals shown include unfiled device data.  Last Pain:  Vitals:   10/22/23 0711  TempSrc: Temporal  PainSc: 8       Patients Stated Pain Goal: 0 (10/22/23 0629)  Complications: No notable events documented.

## 2023-10-22 NOTE — Anesthesia Procedure Notes (Signed)
 Procedure Name: Intubation Date/Time: 10/22/2023 7:47 AM  Performed by: Katy Brickell A, CRNAPre-anesthesia Checklist: Patient identified, Emergency Drugs available, Suction available and Patient being monitored Patient Re-evaluated:Patient Re-evaluated prior to induction Oxygen Delivery Method: Circle System Utilized Preoxygenation: Pre-oxygenation with 100% oxygen Induction Type: IV induction Ventilation: Mask ventilation without difficulty Laryngoscope Size: Glidescope and 3 Grade View: Grade II Tube type: Oral Tube size: 8.5 mm Number of attempts: 1 Airway Equipment and Method: Stylet and Oral airway Placement Confirmation: ETT inserted through vocal cords under direct vision, positive ETCO2 and breath sounds checked- equal and bilateral Secured at: 20 cm Tube secured with: Tape Dental Injury: Teeth and Oropharynx as per pre-operative assessment

## 2023-10-23 ENCOUNTER — Telehealth: Payer: Self-pay | Admitting: Pulmonary Disease

## 2023-10-23 DIAGNOSIS — G08 Intracranial and intraspinal phlebitis and thrombophlebitis: Secondary | ICD-10-CM | POA: Diagnosis not present

## 2023-10-23 LAB — CBC
HCT: 31.7 % — ABNORMAL LOW (ref 36.0–46.0)
Hemoglobin: 10.2 g/dL — ABNORMAL LOW (ref 12.0–15.0)
MCH: 27.9 pg (ref 26.0–34.0)
MCHC: 32.2 g/dL (ref 30.0–36.0)
MCV: 86.6 fL (ref 80.0–100.0)
Platelets: 194 K/uL (ref 150–400)
RBC: 3.66 MIL/uL — ABNORMAL LOW (ref 3.87–5.11)
RDW: 14.2 % (ref 11.5–15.5)
WBC: 5.3 K/uL (ref 4.0–10.5)
nRBC: 0 % (ref 0.0–0.2)

## 2023-10-23 LAB — COMPREHENSIVE METABOLIC PANEL WITH GFR
ALT: 16 U/L (ref 0–44)
AST: 21 U/L (ref 15–41)
Albumin: 2.8 g/dL — ABNORMAL LOW (ref 3.5–5.0)
Alkaline Phosphatase: 58 U/L (ref 38–126)
Anion gap: 10 (ref 5–15)
BUN: 8 mg/dL (ref 6–20)
CO2: 25 mmol/L (ref 22–32)
Calcium: 9 mg/dL (ref 8.9–10.3)
Chloride: 100 mmol/L (ref 98–111)
Creatinine, Ser: 0.77 mg/dL (ref 0.44–1.00)
GFR, Estimated: >60 mL/min (ref >60)
Glucose, Bld: 99 mg/dL (ref 70–99)
Potassium: 4.2 mmol/L (ref 3.5–5.1)
Sodium: 135 mmol/L (ref 135–145)
Total Bilirubin: 0.5 mg/dL (ref 0.0–1.2)
Total Protein: 7 g/dL (ref 6.5–8.1)

## 2023-10-23 LAB — MAGNESIUM: Magnesium: 1.8 mg/dL (ref 1.7–2.4)

## 2023-10-23 LAB — HEPARIN LEVEL (UNFRACTIONATED)
Heparin Unfractionated: 0.21 [IU]/mL — ABNORMAL LOW (ref 0.30–0.70)
Heparin Unfractionated: 0.34 [IU]/mL (ref 0.30–0.70)

## 2023-10-23 MED ORDER — WARFARIN SODIUM 5 MG PO TABS
5.0000 mg | ORAL_TABLET | Freq: Once | ORAL | Status: DC
Start: 2023-10-23 — End: 2023-10-23

## 2023-10-23 MED ORDER — WARFARIN - PHARMACIST DOSING INPATIENT: Freq: Every day | Status: DC

## 2023-10-23 MED ORDER — ENOXAPARIN SODIUM 100 MG/ML IJ SOSY
100.0000 mg | PREFILLED_SYRINGE | Freq: Two times a day (BID) | INTRAMUSCULAR | Status: DC
  Filled 2023-10-23: qty 1

## 2023-10-23 MED ORDER — WARFARIN SODIUM 5 MG PO TABS
5.0000 mg | ORAL_TABLET | Freq: Once | ORAL | Status: AC
  Administered 2023-10-23: 5 mg via ORAL
  Filled 2023-10-23: qty 1

## 2023-10-23 MED ORDER — HEPARIN (PORCINE) 25000 UT/250ML-% IV SOLN
1100.0000 [IU]/h | INTRAVENOUS | Status: AC
  Administered 2023-10-25 – 2023-10-27 (×3): 1100 [IU]/h via INTRAVENOUS
  Filled 2023-10-23 (×4): qty 250

## 2023-10-23 NOTE — Plan of Care (Signed)
   Problem: Education: Goal: Knowledge of General Education information will improve Description: Including pain rating scale, medication(s)/side effects and non-pharmacologic comfort measures Outcome: Progressing   Problem: Health Behavior/Discharge Planning: Goal: Ability to manage health-related needs will improve Outcome: Progressing   Problem: Clinical Measurements: Goal: Ability to maintain clinical measurements within normal limits will improve Outcome: Progressing Goal: Diagnostic test results will improve Outcome: Progressing   Problem: Nutrition: Goal: Adequate nutrition will be maintained Outcome: Progressing

## 2023-10-23 NOTE — Progress Notes (Signed)
 PHARMACY - ANTICOAGULATION CONSULT NOTE  Pharmacy Consult for heparin  + add Coumadin  Indication: Dural venous thrombus  Allergies  Allergen Reactions   Bicillin C-R Hives   Penicillins Hives        Prednisone  Anaphylaxis    seizure   Nsaids Hives    Hives     Patient Measurements: Height: 5' 5 (165.1 cm) Weight: 98.1 kg (216 lb 4.3 oz) IBW/kg (Calculated) : 57 HEPARIN  DW (KG): 79.3  Vital Signs: Temp: 98 F (36.7 C) (09/13 1517) Temp Source: Oral (09/13 1517) BP: 93/61 (09/13 1517) Pulse Rate: 89 (09/13 1106)  Labs: Recent Labs    10/21/23 0244 10/21/23 0947 10/22/23 0422 10/22/23 2347 10/23/23 0617  HGB 10.1*  --  9.7*  --  10.2*  HCT 32.0*  --  31.0*  --  31.7*  PLT 185  --  206  --  194  HEPARINUNFRC 0.77* 0.51  --  0.21* 0.34  CREATININE  --   --  0.71  --  0.77    Estimated Creatinine Clearance: 91 mL/min (by C-G formula based on SCr of 0.77 mg/dL).  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  She is not on anticoagulation PTA. 9/9 MRI with acute infarct, therefore targeting lower heparin  level goal.  Now s/p bronch / EBUS on 9/12. Per note from Dr Jude, may resume heparin  drip at 16:00 if no significant hemoptysis. Spoke with RN who reports no hemoptysis.  10/23/23: Heparin  level 0.35, therapeutic on heparin  1100 units/hr. No issues with infusion running and no further reports of hemoptysis per RN. CBC stable (Hgb 10.2, PLT 194).   Considered changing to Lovenox  but pt refusing injections.  Pharmacy asked to start Coumadin  as well.  Goal of Therapy:  Heparin  level 0.3-0.5 units/ml Monitor platelets by anticoagulation protocol: Yes   Plan:  Continue IV Heparin  at 1100 units/hr Start Coumadin  with 5 mg x 1 tonight -will need to be cautious given potential interaction with flagyl . Daily heparin  level, CBC and  INR.  Harlene Barlow, Berd[employer], BCCP Clinical Pharmacist  10/23/2023 5:09 PM   [facility] pharmacy phone numbers are listed on [email or web address]

## 2023-10-23 NOTE — Telephone Encounter (Signed)
 Andrea Mullen pt  -lost to follow-up, presented with right upper lobe cavity, paratracheal lymphadenopathy and extensive thrombosis from SVC all the way up to cerebral veins -Underwent bronchoscopy/EBUS , prelim negative  Please make appointment with SG/ Andrea Mullen in 2 weeks for follow-up Will need follow-up imaging

## 2023-10-23 NOTE — Progress Notes (Addendum)
 PHARMACY - ANTICOAGULATION CONSULT NOTE  Pharmacy Consult for heparin  Indication: Dural venous thrombus  Allergies  Allergen Reactions   Bicillin C-R Hives   Penicillins Hives        Prednisone  Anaphylaxis    seizure   Nsaids Hives    Hives     Patient Measurements: Height: 5' 5 (165.1 cm) Weight: 98.1 kg (216 lb 4.3 oz) IBW/kg (Calculated) : 57 HEPARIN  DW (KG): 79.3  Vital Signs: Temp: 98.6 F (37 C) (09/13 1106) Temp Source: Oral (09/13 1106) BP: 104/60 (09/13 1106) Pulse Rate: 89 (09/13 1106)  Labs: Recent Labs    10/21/23 0244 10/21/23 0947 10/22/23 0422 10/22/23 2347 10/23/23 0617  HGB 10.1*  --  9.7*  --  10.2*  HCT 32.0*  --  31.0*  --  31.7*  PLT 185  --  206  --  194  HEPARINUNFRC 0.77* 0.51  --  0.21* 0.34  CREATININE  --   --  0.71  --  0.77    Estimated Creatinine Clearance: 91 mL/min (by C-G formula based on SCr of 0.77 mg/dL).  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  She is not on anticoagulation PTA. 9/9 MRI with acute infarct, therefore targeting lower heparin  level goal.  Now s/p bronch / EBUS on 9/12. Per note from Dr Jude, may resume heparin  drip at 16:00 if no significant hemoptysis. Spoke with RN who reports no hemoptysis.  10/23/23: Heparin  level 0.35, therapeutic on heparin  1100 units/hr. No issues with infusion running and no further reports of hemoptysis per RN. CBC stable (Hgb 10.2, PLT 194).   Goal of Therapy:  Heparin  level 0.3-0.5 units/ml Monitor platelets by anticoagulation protocol: Yes   Plan:  Continue heparin  drip at 1100 units/hr - no bolus -6 hr confirmatory heparin  level -Daily heparin  level, CBC -Monitor for s/sx of bleeding  ADDENDUM: Discussed with MD, plan to transition patient to therapeutic enoxaparin  100 mg every 12 hours (~1 mg/kg) this afternoon.   Thank you for  involving pharmacy in this patient's care.  Morna Breach, PharmD PGY2 Cardiology Pharmacy Resident 10/23/2023 12:22 PM

## 2023-10-23 NOTE — Progress Notes (Signed)
 PHARMACY - ANTICOAGULATION CONSULT NOTE  Pharmacy Consult for heparin  Indication: DVST  Labs: Recent Labs    10/20/23 0246 10/21/23 0244 10/21/23 0947 10/22/23 0422 10/22/23 2347  HGB 10.0* 10.1*  --  9.7*  --   HCT 31.3* 32.0*  --  31.0*  --   PLT 160 185  --  206  --   HEPARINUNFRC 0.39 0.77* 0.51  --  0.21*  CREATININE 0.67  --   --  0.71  --    Assessment/Plan:  57yo female subtherapeutic on heparin  after resuming but was previously therapeutiuc at this rate and suspect heparin  will continue to accumulate.  RN reports minor hemoptysis as expected after bronch.  Will continue infusion at current rate of 1100 units/hr and check additional level.  Marvetta Dauphin, PharmD, BCPS 10/23/2023 12:16 AM

## 2023-10-23 NOTE — Progress Notes (Signed)
 PROGRESS NOTE  Andrea Mullen FMW:[id number] DOB: September 21, 1965 DOA: 10/18/2023 PCP: [facility]   LOS: 4 days   Brief Narrative / Interim history: Andrea Mullen is a 58 y.o. female with a history of hypertension, hyperlipidemia, lung cancer s/p radiation and chemotherapy, left eye blindness. Patient presented secondary to chest pain and dyspnea was found to have evidence of head/neck thrombosis, dural sinus thrombosis, acute stroke. Neurology and vascular surgery consulted. Patient started on heparin  IV. During workup, patient was also found to have evidence of a right cavitary lesion; pulmonology consulted with plan for bronchoscopy/EBUS.   Subjective / 24h Interval events: The patient continues to complain of headache. No new complaints. Will convert from heparin  gtt to lovenox  today.  Assesement and Plan: Principal problem Acute head/neck thrombus -on admission, imaging showed evidence of acute thrombus involving SVC, brachiocephalic vein, azygous vein, right axillary vein and right subclavian vein. Heparin  IV started for management. Vascular surgery consulted and recommended no surgical intervention at this time. -Continue Heparin  IV with eventual transition to outpatient anticoagulation regimen, neurology recommends Coumadin  bridged with Lovenox .  Underwent bronchoscopy this morning, there was some bleeding per Dr. Jude, he recommends holding anticoagulation for the next 6 hours.  No hemoptysis reported.  The patient was restarted on heparin  following procedure. No hemoptysis or bleeding. Hemoglobin is stable. Will start lovenox  today and stop heparin  drip.   Acute problems Dural sinus thrombosis - Acute. Patient presented with a headache which has improved. Neurology consulted and following, continue anticoagulation.    Cavitary lesion -patient presented with dyspnea, imaging showed right upper lung lesion measuring 4.4 cm x 4.8 cm x 4.6 cm. Complicated by history of small  cell lung cancer. Concerning for malignant etiology. Pulmonology consulted, underwent bronchoscopy this morning.  Discussed with Dr. Jude, preliminary path results negative for malignancy - Pulmonary recommends IV antibiotics with Unasyn now, and continuation of IV antibiotics until bronchoscopy cultures result. She is pen allergic, so will cover with ceftriaxone  / metronidazole  after speaking with pharmacy   History of small cell lung cancer - Noted.  Bronchoscopy primary pathology negative, but final pathology is pending.   Left leg weakness - PT/OT consulted.   Acute stroke - MRI confirms a 6 mm acute ischemic nonhemorrhagic infarct involving the right splenium and a 6 mm focus of enhancement involving the right pons.  Per neuro the etiology is likely small vessel disease.   Aortic valve stenosis - Noted to be severe on Transthoracic Echocardiogram from 2023.  2D echocardiogram obtained 9/10 shows LVEF 65 to 65%, grade 1 diastolic dysfunction, and it showed severe calcification with moderate to severe AS.   Normocytic anemia -hemoglobin stable, no bleeding.   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  Scheduled Meds:  polyethylene glycol  17 g Oral Daily   senna-docusate  1 tablet Oral BID   sodium chloride  flush  3 mL Intravenous Q12H   Continuous Infusions:  cefTRIAXone  (ROCEPHIN )  IV Stopped (10/22/23 1332)   heparin  1,100 Units/hr (10/23/23 1009)   metronidazole  500 mg (10/23/23 1147)    PRN Meds:.acetaminophen  **OR** acetaminophen , albuterol , HYDROcodone -acetaminophen , phenol  Current Outpatient Medications  Medication Instructions   albuterol  (PROVENTIL ) 2.5 mg, Nebulization, Every 6 hours PRN   albuterol  (VENTOLIN  HFA) 108 (90 Base) MCG/ACT inhaler 2 puffs, Inhalation, Every 4 hours PRN   butalbital -acetaminophen -caffeine  (FIORICET) 50-325-40 MG tablet 1-2 tablets, Oral, Every 6 hours PRN   rosuvastatin  (CRESTOR ) 40 mg, Oral, Daily   Vitamin D (Ergocalciferol) (DRISDOL) 50,000 Units,  Weekly  Diet Orders (From admission, onward)     Start     Ordered   10/22/23 1107  Diet Heart Room service appropriate? Yes; Fluid consistency: Thin  Diet effective now       Question Answer Comment  Room service appropriate? Yes   Fluid consistency: Thin      10/22/23 1106            DVT prophylaxis:    Lab Results  Component Value Date   PLT 194 10/23/2023      Code Status: Full Code  Family Communication: no family at bedside   Status is: Inpatient Remains inpatient appropriate because: severity of illness  Level of care: Progressive  Consultants:  PCCM Neurology  Vascular surgery  Objective: Vitals:   10/22/23 2246 10/23/23 0417 10/23/23 0723 10/23/23 1106  BP: 101/65 92/61 94/71  104/60  Pulse: (!) 103 88 93 89  Resp: 16 18 17 18   Temp:  97.8 F (36.6 C) 97.8 F (36.6 C) 98.6 F (37 C)  TempSrc:  Oral Oral Oral  SpO2: 94% 97%  96%  Weight:      Height:        Intake/Output Summary (Last 24 hours) at 10/23/2023 1318 Last data filed at 10/23/2023 1107 Gross per 24 hour  Intake 585.71 ml  Output 500 ml  Net 85.71 ml   Wt Readings from Last 3 Encounters:  10/19/23 98.1 kg  09/20/23 90.7 kg  06/04/22 108.3 kg    Examination:  Exam:  Constitutional:  The patient is awake, alert, and oriented x 3. No acute distress. Eyes:  pupils and irises appear normal Normal lids and conjunctivae Respiratory:  No increased work of breathing. No wheezes, rales, or rhonchi No tactile fremitus Cardiovascular:  Regular rate and rhythm No murmurs, ectopy, or gallups. No lateral PMI. No thrills. Abdomen:  Abdomen is soft, non-tender, non-distended No hernias, masses, or organomegaly Normoactive bowel sounds.  Musculoskeletal:  No cyanosis, clubbing, or edema Skin:  No rashes, lesions, ulcers palpation of skin: no induration or nodules Neurologic:  CN 2-12 intact Sensation all 4 extremities intact Psychiatric:  Mental status Mood, affect  appropriate Orientation to person, place, time  judgment and insight appear intact   Data Reviewed: I have independently reviewed following labs and imaging studies   CBC Recent Labs  Lab 10/18/23 1630 10/20/23 0246 10/21/23 0244 10/22/23 0422 10/23/23 0617  WBC 7.1  7.0 5.1 4.8 4.1 5.3  HGB 10.8*  10.8* 10.0* 10.1* 9.7* 10.2*  HCT 34.3*  33.8* 31.3* 32.0* 31.0* 31.7*  PLT 187  170 160 185 206 194  MCV 87.5  86.7 86.7 86.5 88.1 86.6  MCH 27.6  27.7 27.7 27.3 27.6 27.9  MCHC 31.5  32.0 31.9 31.6 31.3 32.2  RDW 14.1  14.1 14.2 14.1 14.4 14.2  LYMPHSABS 1.0  --   --   --   --   MONOABS 0.6  --   --   --   --   EOSABS 0.1  --   --   --   --   BASOSABS 0.0  --   --   --   --     Recent Labs  Lab 10/18/23 1630 10/20/23 0246 10/22/23 0422 10/23/23 0617  NA 136 134* 138 135  K 3.8 3.9 4.2 4.2  CL 100 102 102 100  CO2 24 23 24 25   GLUCOSE 105* 95 96 99  BUN 14 10 11 8   CREATININE 0.71 0.67 0.71 0.77  CALCIUM   9.6 8.9 9.0 9.0  AST 21  --  17 21  ALT 13  --  16 16  ALKPHOS 76  --  57 58  BILITOT 0.5  --  0.6 0.5  ALBUMIN 3.9  --  3.0* 2.8*  MG  --   --  2.0 1.8    ------------------------------------------------------------------------------------------------------------------ No results for input(s): CHOL, HDL, LDLCALC, TRIG, CHOLHDL, LDLDIRECT in the last 72 hours.  Lab Results  Component Value Date   HGBA1C 5.3 11/08/2021   ------------------------------------------------------------------------------------------------------------------ No results for input(s): TSH, T4TOTAL, T3FREE, THYROIDAB in the last 72 hours.  Invalid input(s): FREET3  Cardiac Enzymes No results for input(s): CKMB, TROPONINI, MYOGLOBIN in the last 168 hours.  Invalid input(s): CK ------------------------------------------------------------------------------------------------------------------    Component Value Date/Time   BNP 103.2 (H) 10/14/2023  0835    CBG: No results for input(s): GLUCAP in the last 168 hours.  Recent Results (from the past 240 hours)  Resp panel by RT-PCR (RSV, Flu A&B, Covid) Anterior Nasal Swab     Status: None   Collection Time: 10/14/23  8:30 AM   Specimen: Anterior Nasal Swab  Result Value Ref Range Status   SARS Coronavirus 2 by RT PCR NEGATIVE NEGATIVE Final   Influenza A by PCR NEGATIVE NEGATIVE Final   Influenza B by PCR NEGATIVE NEGATIVE Final    Comment: (NOTE) The Xpert Xpress SARS-CoV-2/FLU/RSV plus assay is intended as an aid in the diagnosis of influenza from Nasopharyngeal swab specimens and should not be used as a sole basis for treatment. Nasal washings and aspirates are unacceptable for Xpert Xpress SARS-CoV-2/FLU/RSV testing.  Fact Sheet for Patients: BloggerCourse.com  Fact Sheet for Healthcare Providers: SeriousBroker.it  This test is not yet approved or cleared by the [place]  FDA and has been authorized for detection and/or diagnosis of SARS-CoV-2 by FDA under an Emergency Use Authorization (EUA). This EUA will remain in effect (meaning this test can be used) for the duration of the COVID-19 declaration under Section 564(b)(1) of the Act, 21 U.S.C. section 360bbb-3(b)(1), unless the authorization is terminated or revoked.     Resp Syncytial Virus by PCR NEGATIVE NEGATIVE Final    Comment: (NOTE) Fact Sheet for Patients: BloggerCourse.com  Fact Sheet for Healthcare Providers: SeriousBroker.it  This test is not yet approved or cleared by the [place]  FDA and has been authorized for detection and/or diagnosis of SARS-CoV-2 by FDA under an Emergency Use Authorization (EUA). This EUA will remain in effect (meaning this test can be used) for the duration of the COVID-19 declaration under Section 564(b)(1) of the Act, 21 U.S.C. section 360bbb-3(b)(1), unless the  authorization is terminated or revoked.  Performed at [facility], [address] [place], [place] [address]   MRSA Next Gen by PCR, Nasal     Status: None   Collection Time: 10/19/23  1:55 PM   Specimen: Nasal Mucosa; Nasal Swab  Result Value Ref Range Status   MRSA by PCR Next Gen NOT DETECTED NOT DETECTED Final    Comment: (NOTE) The GeneXpert MRSA Assay (FDA approved for NASAL specimens only), is one component of a comprehensive MRSA colonization surveillance program. It is not intended to diagnose MRSA infection nor to guide or monitor treatment for MRSA infections. Test performance is not FDA approved in patients less than 10 years old. Performed at [facility], [address] [place], [place] [address]   Surgical pcr screen     Status: None   Collection Time: 10/21/23  8:25 PM   Specimen: Nasal Mucosa; Nasal Swab  Result Value Ref Range Status   MRSA, PCR NEGATIVE NEGATIVE Final   Staphylococcus aureus NEGATIVE NEGATIVE Final    Comment: (NOTE) The Xpert SA Assay (FDA approved for NASAL specimens in patients 76 years of age and older), is one component of a comprehensive surveillance program. It is not intended to diagnose infection nor to guide or monitor treatment. Performed at [facility], [address], [place], [place] [address]   Culture, BAL-quantitative w Gram Stain     Status: None (Preliminary result)   Collection Time: 10/22/23  8:48 AM   Specimen: Bronchial Alveolar Lavage; Respiratory  Result Value Ref Range Status   Specimen Description BRONCHIAL ALVEOLAR LAVAGE  Final   Special Requests NONE  Final   Gram Stain   Final    NO WBC SEEN NO ORGANISMS SEEN Performed at [facility], [address], [place], [place] [address]    Culture PENDING  Incomplete   Report Status PENDING  Incomplete     Radiology Studies: No results found.  Savana Spina, DO [facility] 10/23/2023 13:36  Between 7 am - 7 pm I am  available, please contact me via Amion (for emergencies) or Securechat (non urgent messages)  Between 7 pm - 7 am I am not available, please contact night coverage MD/APP via Amion

## 2023-10-24 ENCOUNTER — Inpatient Hospital Stay (HOSPITAL_COMMUNITY)

## 2023-10-24 DIAGNOSIS — G08 Intracranial and intraspinal phlebitis and thrombophlebitis: Secondary | ICD-10-CM | POA: Diagnosis not present

## 2023-10-24 LAB — CBC
HCT: 30.4 % — ABNORMAL LOW (ref 36.0–46.0)
Hemoglobin: 9.7 g/dL — ABNORMAL LOW (ref 12.0–15.0)
MCH: 27.9 pg (ref 26.0–34.0)
MCHC: 31.9 g/dL (ref 30.0–36.0)
MCV: 87.4 fL (ref 80.0–100.0)
Platelets: 194 K/uL (ref 150–400)
RBC: 3.48 MIL/uL — ABNORMAL LOW (ref 3.87–5.11)
RDW: 14.6 % (ref 11.5–15.5)
WBC: 5.8 K/uL (ref 4.0–10.5)
nRBC: 0 % (ref 0.0–0.2)

## 2023-10-24 LAB — COMPREHENSIVE METABOLIC PANEL WITH GFR
ALT: 16 U/L (ref 0–44)
AST: 20 U/L (ref 15–41)
Albumin: 2.9 g/dL — ABNORMAL LOW (ref 3.5–5.0)
Alkaline Phosphatase: 59 U/L (ref 38–126)
Anion gap: 11 (ref 5–15)
BUN: 10 mg/dL (ref 6–20)
CO2: 24 mmol/L (ref 22–32)
Calcium: 9 mg/dL (ref 8.9–10.3)
Chloride: 100 mmol/L (ref 98–111)
Creatinine, Ser: 0.75 mg/dL (ref 0.44–1.00)
GFR, Estimated: >60 mL/min (ref >60)
Glucose, Bld: 107 mg/dL — ABNORMAL HIGH (ref 70–99)
Potassium: 4.2 mmol/L (ref 3.5–5.1)
Sodium: 135 mmol/L (ref 135–145)
Total Bilirubin: 0.5 mg/dL (ref 0.0–1.2)
Total Protein: 7 g/dL (ref 6.5–8.1)

## 2023-10-24 LAB — ACID FAST SMEAR (AFB, MYCOBACTERIA): Acid Fast Smear: NEGATIVE

## 2023-10-24 LAB — PROTIME-INR
INR: 1.1 (ref 0.8–1.2)
Prothrombin Time: 15.3 s — ABNORMAL HIGH (ref 11.4–15.2)

## 2023-10-24 LAB — HEPARIN LEVEL (UNFRACTIONATED): Heparin Unfractionated: 0.39 [IU]/mL (ref 0.30–0.70)

## 2023-10-24 MED ORDER — LACTULOSE 10 GM/15ML PO SOLN
20.0000 g | Freq: Two times a day (BID) | ORAL | Status: DC
  Administered 2023-10-24: 20 g via ORAL
  Filled 2023-10-24 (×2): qty 30

## 2023-10-24 MED ORDER — WARFARIN SODIUM 5 MG PO TABS
5.0000 mg | ORAL_TABLET | Freq: Once | ORAL | Status: AC
  Administered 2023-10-24: 5 mg via ORAL
  Filled 2023-10-24: qty 1

## 2023-10-24 MED ORDER — ZINC OXIDE 40 % EX OINT
TOPICAL_OINTMENT | Freq: Every day | CUTANEOUS | Status: DC
  Filled 2023-10-24: qty 57

## 2023-10-24 NOTE — Plan of Care (Signed)
   Problem: Education: Goal: Knowledge of General Education information will improve Description: Including pain rating scale, medication(s)/side effects and non-pharmacologic comfort measures Outcome: Progressing   Problem: Health Behavior/Discharge Planning: Goal: Ability to manage health-related needs will improve Outcome: Progressing   Problem: Clinical Measurements: Goal: Will remain free from infection Outcome: Progressing

## 2023-10-24 NOTE — Progress Notes (Signed)
 PROGRESS NOTE  [name] FMW:[id number] DOB: [birth year] DOA: 10/18/2023 PCP: [facility]   LOS: 5 days   Brief Narrative / Interim history: Andrea Mullen is a 58 y.o. female with a history of hypertension, hyperlipidemia, lung cancer s/p radiation and chemotherapy, left eye blindness. Patient presented secondary to chest pain and dyspnea was found to have evidence of head/neck thrombosis, dural sinus thrombosis, acute stroke. Neurology and vascular surgery consulted. Patient started on heparin  IV. During workup, patient was also found to have evidence of a right cavitary lesion; pulmonology consulted with plan for bronchoscopy/EBUS.   Subjective / 24h Interval events: This morning the patient was refusing lab draws to measure PTT. She is also stating that she wants to go home today. I have explained to her that she has a life threatening clotting problem and that she requires anticoagulation. As she refuses lovenox , our only option is to use a heparin  drip to bridge her to a therapeutic dose of coumadin . She understands and agrees to stay and allow us  to do lab drawn to monitor PTT.  Assesement and Plan: Principal problem Acute head/neck thrombus -on admission, imaging showed evidence of acute thrombus involving SVC, brachiocephalic vein, azygous vein, right axillary vein and right subclavian vein. Heparin  IV started for management. Vascular surgery consulted and recommended no surgical intervention at this time. -Continue Heparin  IV with eventual transition to outpatient anticoagulation regimen, neurology recommends Coumadin  bridged with Lovenox .  Underwent bronchoscopy this morning, there was some bleeding per Dr. Jude, he recommends holding anticoagulation for the next 6 hours.  No hemoptysis reported.  The patient was restarted on heparin  following procedure. No hemoptysis or bleeding. Hemoglobin is stable. The patient has refused to inject herself with lovenox . For this  reason she has been continued on heparin  and pharmacy has been consulted to assist with getting the patient therapeutic on coumadin .   Acute problems Dural sinus thrombosis - Acute. Patient presented with a headache which has improved. Neurology consulted and following, continue anticoagulation.    Cavitary lesion -patient presented with dyspnea, imaging showed right upper lung lesion measuring 4.4 cm x 4.8 cm x 4.6 cm. Complicated by history of small cell lung cancer. Concerning for malignant etiology. Pulmonology consulted, underwent bronchoscopy this morning.  Discussed with Dr. Jude, preliminary path results negative for malignancy - Pulmonary recommends IV antibiotics with Unasyn now, and continuation of IV antibiotics until bronchoscopy cultures result. She is pen allergic, so will cover with ceftriaxone  / metronidazole  after speaking with pharmacy   History of small cell lung cancer - Noted.  Bronchoscopy primary pathology negative, but final pathology is pending.   Left leg weakness - PT/OT consulted.   Acute stroke - MRI confirms a 6 mm acute ischemic nonhemorrhagic infarct involving the right splenium and a 6 mm focus of enhancement involving the right pons.  Per neuro the etiology is likely small vessel disease.   Aortic valve stenosis - Noted to be severe on Transthoracic Echocardiogram from 2023.  2D echocardiogram obtained 9/10 shows LVEF 65 to 65%, grade 1 diastolic dysfunction, and it showed severe calcification with moderate to severe AS.   Normocytic anemia -hemoglobin stable, no bleeding.   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  Scheduled Meds:  liver oil-zinc  oxide   Topical Daily   polyethylene glycol  17 g Oral Daily   senna-docusate  1 tablet Oral BID   sodium chloride  flush  3 mL Intravenous Q12H   warfarin  5 mg Oral ONCE-1600  Warfarin - Pharmacist Dosing Inpatient   Does not apply q1600   Continuous Infusions:  cefTRIAXone  (ROCEPHIN )  IV 2 g (10/24/23 1317)   heparin       metronidazole  500 mg (10/24/23 1151)    PRN Meds:.acetaminophen  **OR** acetaminophen , albuterol , HYDROcodone -acetaminophen , phenol  Current Outpatient Medications  Medication Instructions   albuterol  (PROVENTIL ) 2.5 mg, Nebulization, Every 6 hours PRN   albuterol  (VENTOLIN  HFA) 108 (90 Base) MCG/ACT inhaler 2 puffs, Inhalation, Every 4 hours PRN   butalbital -acetaminophen -caffeine  (FIORICET) 50-325-40 MG tablet 1-2 tablets, Oral, Every 6 hours PRN   rosuvastatin  (CRESTOR ) 40 mg, Oral, Daily   Vitamin D (Ergocalciferol) (DRISDOL) 50,000 Units, Weekly    Diet Orders (From admission, onward)     Start     Ordered   10/22/23 1107  Diet Heart Room service appropriate? Yes; Fluid consistency: Thin  Diet effective now       Question Answer Comment  Room service appropriate? Yes   Fluid consistency: Thin      10/22/23 1106            DVT prophylaxis:  warfarin (COUMADIN ) tablet 5 mg   Lab Results  Component Value Date   PLT 194 10/24/2023      Code Status: Full Code  Family Communication: no family at bedside   Status is: Inpatient Remains inpatient appropriate because: severity of illness  Level of care: Progressive  Consultants:  PCCM Neurology  Vascular surgery  Objective: Vitals:   10/23/23 2356 10/24/23 0524 10/24/23 0706 10/24/23 1126  BP: (!) 108/90 92/79 120/81   Pulse: 94 95 (!) 104   Resp: 14 19 17    Temp: 98.2 F (36.8 C) 97.7 F (36.5 C) 98.8 F (37.1 C) 98.3 F (36.8 C)  TempSrc: Oral Oral Axillary Oral  SpO2: 97% 96% 98%   Weight:      Height:        Intake/Output Summary (Last 24 hours) at 10/24/2023 1345 Last data filed at 10/24/2023 0708 Gross per 24 hour  Intake 240 ml  Output 750 ml  Net -510 ml   Wt Readings from Last 3 Encounters:  10/19/23 98.1 kg  09/20/23 90.7 kg  06/04/22 108.3 kg    Examination:  Exam:  Constitutional:  The patient is awake, alert, and oriented x 3. No acute distress. Eyes:  pupils and irises  appear normal Normal lids and conjunctivae Respiratory:  No increased work of breathing. No wheezes, rales, or rhonchi No tactile fremitus Cardiovascular:  Regular rate and rhythm No murmurs, ectopy, or gallups. No lateral PMI. No thrills. Abdomen:  Abdomen is soft, non-tender, non-distended No hernias, masses, or organomegaly Normoactive bowel sounds.  Musculoskeletal:  No cyanosis, clubbing, or edema Skin:  No rashes, lesions, ulcers palpation of skin: no induration or nodules Neurologic:  CN 2-12 intact Sensation all 4 extremities intact Psychiatric:  Mental status Mood, affect appropriate Orientation to person, place, time  judgment and insight appear intact   Data Reviewed: I have independently reviewed following labs and imaging studies   CBC Recent Labs  Lab 10/18/23 1630 10/20/23 0246 10/21/23 0244 10/22/23 0422 10/23/23 0617 10/24/23 0150  WBC 7.1  7.0 5.1 4.8 4.1 5.3 5.8  HGB 10.8*  10.8* 10.0* 10.1* 9.7* 10.2* 9.7*  HCT 34.3*  33.8* 31.3* 32.0* 31.0* 31.7* 30.4*  PLT 187  170 160 185 206 194 194  MCV 87.5  86.7 86.7 86.5 88.1 86.6 87.4  MCH 27.6  27.7 27.7 27.3 27.6 27.9 27.9  MCHC 31.5  32.0 31.9 31.6 31.3 32.2 31.9  RDW 14.1  14.1 14.2 14.1 14.4 14.2 14.6  LYMPHSABS 1.0  --   --   --   --   --   MONOABS 0.6  --   --   --   --   --   EOSABS 0.1  --   --   --   --   --   BASOSABS 0.0  --   --   --   --   --     Recent Labs  Lab 10/18/23 1630 10/20/23 0246 10/22/23 0422 10/23/23 0617 10/24/23 0150 10/24/23 1209  NA 136 134* 138 135 135  --   K 3.8 3.9 4.2 4.2 4.2  --   CL 100 102 102 100 100  --   CO2 24 23 24 25 24   --   GLUCOSE 105* 95 96 99 107*  --   BUN 14 10 11 8 10   --   CREATININE 0.71 0.67 0.71 0.77 0.75  --   CALCIUM  9.6 8.9 9.0 9.0 9.0  --   AST 21  --  17 21 20   --   ALT 13  --  16 16 16   --   ALKPHOS 76  --  57 58 59  --   BILITOT 0.5  --  0.6 0.5 0.5  --   ALBUMIN 3.9  --  3.0* 2.8* 2.9*  --   MG  --   --  2.0  1.8  --   --   INR  --   --   --   --   --  1.1    ------------------------------------------------------------------------------------------------------------------ No results for input(s): CHOL, HDL, LDLCALC, TRIG, CHOLHDL, LDLDIRECT in the last 72 hours.  Lab Results  Component Value Date   HGBA1C 5.3 11/08/2021   ------------------------------------------------------------------------------------------------------------------ No results for input(s): TSH, T4TOTAL, T3FREE, THYROIDAB in the last 72 hours.  Invalid input(s): FREET3  Cardiac Enzymes No results for input(s): CKMB, TROPONINI, MYOGLOBIN in the last 168 hours.  Invalid input(s): CK ------------------------------------------------------------------------------------------------------------------    Component Value Date/Time   BNP 103.2 (H) 10/14/2023 0835    CBG: No results for input(s): GLUCAP in the last 168 hours.  Recent Results (from the past 240 hours)  MRSA Next Gen by PCR, Nasal     Status: None   Collection Time: 10/19/23  1:55 PM   Specimen: Nasal Mucosa; Nasal Swab  Result Value Ref Range Status   MRSA by PCR Next Gen NOT DETECTED NOT DETECTED Final    Comment: (NOTE) The GeneXpert MRSA Assay (FDA approved for NASAL specimens only), is one component of a comprehensive MRSA colonization surveillance program. It is not intended to diagnose MRSA infection nor to guide or monitor treatment for MRSA infections. Test performance is not FDA approved in patients less than 18 years old. Performed at [facility], [address], [place], [place] [address]   Surgical pcr screen     Status: None   Collection Time: 10/21/23  8:25 PM   Specimen: Nasal Mucosa; Nasal Swab  Result Value Ref Range Status   MRSA, PCR NEGATIVE NEGATIVE Final   Staphylococcus aureus NEGATIVE NEGATIVE Final    Comment: (NOTE) The Xpert SA Assay (FDA approved for NASAL specimens in patients  45 years of age and older), is one component of a comprehensive surveillance program. It is not intended to diagnose infection nor to guide or monitor treatment. Performed at [facility], [address], [place], [place] [address]   Culture, BAL-quantitative w Gram Stain     Status: None (Preliminary result)   Collection Time: 10/22/23  8:48 AM   Specimen: Bronchial Alveolar Lavage; Respiratory  Result Value Ref Range Status   Specimen Description BRONCHIAL ALVEOLAR LAVAGE  Final   Special Requests NONE  Final   Gram Stain NO WBC SEEN NO ORGANISMS SEEN   Final   Culture   Final    CULTURE REINCUBATED FOR BETTER GROWTH Performed at [facility], [address], [place], [place] [address]    Report Status PENDING  Incomplete  Acid Fast Smear (AFB)     Status: None   Collection Time: 10/22/23  8:48 AM   Specimen: Bronchial Alveolar Lavage; Respiratory  Result Value Ref Range Status   AFB Specimen Processing Concentration  Final   Acid Fast Smear Negative  Final    Comment: (NOTE) Performed At: [facility] [address] [place], [place] [id number] Jennette Shorter MD Ey:[id number]    Source (AFB) BRONCHIAL ALVEOLAR LAVAGE  Final    Comment: Performed at [facility], [address] [place], [place] [address]     Radiology Studies: No results found.  Verdie Barrows, DO [facility] 10/24/2023 13:58  Between 7 am - 7 pm I am available, please contact me via Amion (for emergencies) or Securechat (non urgent messages)  Between 7 pm - 7 am I am not available, please contact night coverage MD/APP via Amion

## 2023-10-24 NOTE — Plan of Care (Signed)
  Problem: Education: Goal: Knowledge of General Education information will improve Description: Including pain rating scale, medication(s)/side effects and non-pharmacologic comfort measures Outcome: Progressing   Problem: Health Behavior/Discharge Planning: Goal: Ability to manage health-related needs will improve Outcome: Progressing   Problem: Clinical Measurements: Goal: Ability to maintain clinical measurements within normal limits will improve Outcome: Progressing Goal: Will remain free from infection Outcome: Progressing Goal: Diagnostic test results will improve Outcome: Progressing Goal: Respiratory complications will improve Outcome: Progressing Goal: Cardiovascular complication will be avoided Outcome: Progressing   Problem: Activity: Goal: Risk for activity intolerance will decrease Outcome: Progressing   Problem: Nutrition: Goal: Adequate nutrition will be maintained Outcome: Progressing   Problem: Coping: Goal: Level of anxiety will decrease Outcome: Progressing   Problem: Elimination: Goal: Will not experience complications related to bowel motility Outcome: Progressing Goal: Will not experience complications related to urinary retention Outcome: Progressing   Problem: Pain Managment: Goal: General experience of comfort will improve and/or be controlled Outcome: Progressing   Problem: Safety: Goal: Ability to remain free from injury will improve Outcome: Progressing   Problem: Skin Integrity: Goal: Risk for impaired skin integrity will decrease Outcome: Progressing   Problem: Nutrition: Goal: Risk of aspiration will decrease Outcome: Progressing Goal: Dietary intake will improve Outcome: Progressing   Problem: Nutrition: Goal: Risk of aspiration will decrease Outcome: Progressing Goal: Dietary intake will improve Outcome: Progressing

## 2023-10-24 NOTE — Progress Notes (Signed)
 PHARMACY - ANTICOAGULATION CONSULT NOTE  Pharmacy Consult for heparin  + add Coumadin  Indication: Dural venous thrombus  Allergies  Allergen Reactions   Bicillin C-R Hives   Penicillins Hives        Prednisone  Anaphylaxis    seizure   Nsaids Hives    Hives     Patient Measurements: Height: 5' 5 (165.1 cm) Weight: 98.1 kg (216 lb 4.3 oz) IBW/kg (Calculated) : 57 HEPARIN  DW (KG): 79.3  Vital Signs: Temp: 98.3 F (36.8 C) (09/14 1126) Temp Source: Oral (09/14 1126) BP: 120/81 (09/14 0706) Pulse Rate: 104 (09/14 0706)  Labs: Recent Labs    10/22/23 0422 10/22/23 2347 10/23/23 0617 10/24/23 0150 10/24/23 1209  HGB 9.7*  --  10.2* 9.7*  --   HCT 31.0*  --  31.7* 30.4*  --   PLT 206  --  194 194  --   LABPROT  --   --   --   --  15.3*  INR  --   --   --   --  1.1  HEPARINUNFRC  --  0.21* 0.34  --  0.39  CREATININE 0.71  --  0.77 0.75  --     Estimated Creatinine Clearance: 91 mL/min (by C-G formula based on SCr of 0.75 mg/dL).  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  She is not on anticoagulation PTA. 9/9 MRI with acute infarct, therefore targeting lower heparin  level goal.  Now s/p bronch / EBUS on 9/12. Per note from Dr Jude, may resume heparin  drip at 16:00 if no significant hemoptysis. Spoke with RN who reports no hemoptysis.  Heparin  level therapeutic, INR 1.1  Goal of Therapy:  Heparin  level 0.3-0.5 units/ml Monitor platelets by anticoagulation protocol: Yes INR 2 to 3   Plan:  Continue IV Heparin  at 1100 units/hr Repeat  Coumadin  with 5 mg x 1 tonight -will need to be cautious given potential interaction with flagyl . Daily heparin  level, CBC and INR.  Thank you. Olam Monte, PharmD  10/24/2023 1:11 PM   [facility] pharmacy phone numbers are listed on [email or web address]

## 2023-10-25 ENCOUNTER — Inpatient Hospital Stay (HOSPITAL_COMMUNITY)

## 2023-10-25 ENCOUNTER — Inpatient Hospital Stay

## 2023-10-25 ENCOUNTER — Inpatient Hospital Stay: Admitting: Internal Medicine

## 2023-10-25 ENCOUNTER — Encounter (HOSPITAL_COMMUNITY): Payer: Self-pay | Admitting: Pulmonary Disease

## 2023-10-25 DIAGNOSIS — I82B11 Acute embolism and thrombosis of right subclavian vein: Secondary | ICD-10-CM | POA: Diagnosis not present

## 2023-10-25 DIAGNOSIS — G08 Intracranial and intraspinal phlebitis and thrombophlebitis: Secondary | ICD-10-CM | POA: Diagnosis not present

## 2023-10-25 DIAGNOSIS — J984 Other disorders of lung: Secondary | ICD-10-CM | POA: Diagnosis not present

## 2023-10-25 LAB — BASIC METABOLIC PANEL WITH GFR
Anion gap: 10 (ref 5–15)
BUN: 11 mg/dL (ref 6–20)
CO2: 24 mmol/L (ref 22–32)
Calcium: 9.1 mg/dL (ref 8.9–10.3)
Chloride: 103 mmol/L (ref 98–111)
Creatinine, Ser: 0.78 mg/dL (ref 0.44–1.00)
GFR, Estimated: >60 mL/min (ref >60)
Glucose, Bld: 100 mg/dL — ABNORMAL HIGH (ref 70–99)
Potassium: 3.8 mmol/L (ref 3.5–5.1)
Sodium: 137 mmol/L (ref 135–145)

## 2023-10-25 LAB — CULTURE, BAL-QUANTITATIVE W GRAM STAIN
Culture: 20000 — AB
Gram Stain: NONE SEEN

## 2023-10-25 LAB — URIC ACID: Uric Acid, Serum: 4.7 mg/dL (ref 2.5–7.1)

## 2023-10-25 LAB — PROTIME-INR
INR: 1.2 (ref 0.8–1.2)
Prothrombin Time: 16 s — ABNORMAL HIGH (ref 11.4–15.2)

## 2023-10-25 LAB — PHOSPHORUS: Phosphorus: 3.1 mg/dL (ref 2.5–4.6)

## 2023-10-25 LAB — HEPARIN LEVEL (UNFRACTIONATED): Heparin Unfractionated: 0.41 [IU]/mL (ref 0.30–0.70)

## 2023-10-25 MED ORDER — WARFARIN SODIUM 5 MG PO TABS
5.0000 mg | ORAL_TABLET | Freq: Once | ORAL | Status: AC
  Administered 2023-10-25: 5 mg via ORAL
  Filled 2023-10-25: qty 1

## 2023-10-25 MED ORDER — FUROSEMIDE 40 MG PO TABS
40.0000 mg | ORAL_TABLET | Freq: Once | ORAL | Status: AC
  Administered 2023-10-25: 40 mg via ORAL
  Filled 2023-10-25: qty 1

## 2023-10-25 MED ORDER — ALBUTEROL SULFATE HFA 108 (90 BASE) MCG/ACT IN AERS
1.0000 | INHALATION_SPRAY | Freq: Four times a day (QID) | RESPIRATORY_TRACT | Status: DC
  Administered 2023-10-25 (×2): 2 via RESPIRATORY_TRACT
  Administered 2023-10-25: 1 via RESPIRATORY_TRACT
  Administered 2023-10-26 – 2023-10-27 (×5): 2 via RESPIRATORY_TRACT
  Filled 2023-10-25: qty 6.7

## 2023-10-25 MED ORDER — HYDRALAZINE HCL 20 MG/ML IJ SOLN: 10.0000 mg | INTRAMUSCULAR | Status: DC | PRN

## 2023-10-25 MED ORDER — GLUCAGON HCL RDNA (DIAGNOSTIC) 1 MG IJ SOLR: 1.0000 mg | INTRAMUSCULAR | Status: DC | PRN

## 2023-10-25 MED ORDER — IOHEXOL 350 MG/ML SOLN
75.0000 mL | Freq: Once | INTRAVENOUS | Status: AC | PRN
  Administered 2023-10-25: 75 mL via INTRAVENOUS

## 2023-10-25 MED ORDER — TRAZODONE HCL 50 MG PO TABS: 50.0000 mg | ORAL_TABLET | Freq: Every evening | ORAL | Status: DC | PRN

## 2023-10-25 MED ORDER — METOPROLOL TARTRATE 5 MG/5ML IV SOLN: 5.0000 mg | INTRAVENOUS | Status: DC | PRN

## 2023-10-25 MED ORDER — ONDANSETRON HCL 4 MG/2ML IJ SOLN: 4.0000 mg | Freq: Four times a day (QID) | INTRAMUSCULAR | Status: DC | PRN

## 2023-10-25 MED ORDER — WARFARIN SODIUM 5 MG PO TABS: 5.0000 mg | ORAL_TABLET | Freq: Once | ORAL | Status: DC

## 2023-10-25 MED ORDER — IPRATROPIUM-ALBUTEROL 0.5-2.5 (3) MG/3ML IN SOLN: 3.0000 mL | RESPIRATORY_TRACT | Status: DC | PRN

## 2023-10-25 MED ORDER — CYCLOBENZAPRINE HCL 10 MG PO TABS
5.0000 mg | ORAL_TABLET | Freq: Three times a day (TID) | ORAL | Status: DC | PRN
  Administered 2023-10-25 – 2023-10-26 (×3): 5 mg via ORAL
  Filled 2023-10-25 (×3): qty 1

## 2023-10-25 NOTE — Consult Note (Addendum)
 ADDENDUM:  Patient was personally and independently interviewed, examined and relevant elements of the history of present illness were reviewed in details and an assessment and plan was created. All elements of the patient's history of present illness, assessment and plan were discussed in detail with Olam JINNY Brunner, NP. The above documentation reflects our combined findings assessment and plan.   Briefly, 58 year old lady with remote history of right-sided small cell lung cancer, diagnosed and treated in 2007 at [facility], admitted with complaints of chest pain.  Currently found to have occlusive thrombus within the SVC and extensive thrombus within the right axillary vein and right subclavian vein.  On heparin  drip.  Also noted was cavitary right upper lobe lesion on CT scan on 10/18/2023, concerning for malignancy.  Mild right hilar lymphadenopathy.  She did undergo biopsy of lesion on 10/22/2023.  Results pending.  Clinical picture concerning for recurrence of small cell lung cancer.  We will await biopsy results and determine further course of action.  She is getting CT abdomen and pelvis to look for any other evidence of disease.  MRI of the brain did not show any intracranial metastatic disease.  She did get prophylactic cranial radiation in 2007.  If confirmed to be small cell lung cancer, we will consider systemic treatment in the inpatient setting.  Continue heparin  drip for now and transition to Lovenox  eventually.  Coumadin  will not be an ideal choice, if she ends up needing chemotherapy.  DOAC's could be an option, if patient is not willing to do Lovenox  injections.  Please call us  with any questions.  Will continue to follow her peripherally and reconvene with biopsy results  [facility] CONSULT NOTE  Patient Care Team: [facility] as PCP - General Shelah, Lamar RAMAN, MD as Consulting Physician (Pulmonary Disease)  CHIEF COMPLAINTS/PURPOSE OF CONSULTATION:   Small Cell Lung Thrombus  REFERRING PHYSICIAN: Dr. Caleen  HISTORY OF PRESENTING ILLNESS:  Andrea Mullen 58 y.o. female who was transferred from [facility] on 10/19/2023 with complaints of chest pain.  Patient has oncologic history significant for lung cancer and with results of imaging, Oncology evaluation has been requested. Patient is seen awake and alert laying in bed with multiple family members at bedside.  Patient with slow verbal responses to questions. One of her daughters who states that patient lives with her, answered most of the questions.  States that approximately one month ago, patient began to have LE swelling making ambulation difficult.  Then she became increasingly short of breath, began wheezing, and could not sleep.  Therefore they decided to seek additional care especially in view of history of lung cancer.   Medical history as stated is significant for lung cancer, treated at [facility], hypertension, hyperlipidemia, and CVA. Surgical history includes C-sections, and cardiac cath.  Family history includes grandmother with uterine cancer. Social history significant for being an active smoker, last smoked 2 weeks ago, and patient states I can't wait to have a cigarette.  Admits to one pack per day.  Admits to alcohol on weekends only.  Denies illicit drug use.  Denies occupational hazardous materials exposure.  Patient has 7 children and lives with one of her daughters.     I have reviewed her chart and materials related to her cancer extensively and collaborated history with the patient. Summary of oncologic history is as follows: Oncology History   No history exists.    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  Metastatic adenopathy right hilum and mediastinum. -- Initially diagnosed 2007 and treated at [facility].  -- Status post radiation therapy and chemotherapy.   Patient states no surgical interventions were done. -- Imaging 10/18/23 shows  lesion within posterior aspect of RUL which may be consistent with patient's known history of lung cancer.  -- Medical Oncology/Dr. Nachman Sundt following and will make further evaluation and treatment recommendations.  Acute Thrombus Bil LE DVTs, chronic -- CTA 9/8 shows occlusive thrombus within SVC and extensive nonocclusive thrombus within right axillary vein and right subclavian vein.  -- On IV heparin  drip, continue per protocol. -- Plan to transition to coumadin .  Anemia -- Hemoglobin 9.7 today -- No transfusional intervention required -- Continue to monitor CBC with differential  Dyspnea Shortness of breath -- Presented with complaints of SOB, reports today that she still feels short of breath. -- Continue respiratory therapy as ordered.   History of CVA -- MR brain 9/9 shows 6 mm nonhemorrhagic infarct.  -- MR recommends follow up in 3 months. -- Recommend follow with PCP upon discharge        MEDICAL HISTORY:  Past Medical History:  Diagnosis Date   Anxiety    Asthma    Blind    left eye   Cancer (HCC)    lung, tx radiation and chemo   Hyperlipemia    Hypertension    Stroke [facility])     SURGICAL HISTORY: Past Surgical History:  Procedure Laterality Date   CARDIAC CATHETERIZATION N/A 11/01/2014   Procedure: Left Heart Cath and Coronary Angiography;  Surgeon: Victory LELON Sharps, MD;  Location: [facility] INVASIVE CV LAB;  Service: Cardiovascular;  Laterality: N/A;   CESAREAN SECTION     x3   HERNIA REPAIR     VIDEO BRONCHOSCOPY WITH ENDOBRONCHIAL ULTRASOUND Right 10/22/2023   Procedure: BRONCHOSCOPY, WITH EBUS;  Surgeon: Jude Harden GAILS, MD;  Location: [facility] ENDOSCOPY;  Service: Cardiopulmonary;  Laterality: Right;    SOCIAL HISTORY: Social History   Socioeconomic History   Marital status: Single    Spouse name: Not on file   Number of children: 7   Years of education: Not on file   Highest  education level: Some college, no degree  Occupational History   Not on file  Tobacco Use   Smoking status: Every Day    Current packs/day: 0.50    Types: Cigarettes   Smokeless tobacco: Never   Tobacco comments:    Pt stopped smoking 06/04/22 [facility]   Vaping Use   Vaping status: Never Used  Substance and Sexual Activity   Alcohol use: Not Currently    Comment: Sober x 1 month   Drug use: Not Currently    Types: Marijuana, Cocaine     Comment: none x approx 1 month   Sexual activity: Yes    Comment: 12/11/21 clean x 3 months  Other Topics Concern   Not on file  Social History Narrative   Lives with daughter   Social Drivers of Health   Financial Resource Strain: Medium Risk (10/05/2023)   Received from [facility]   Overall Financial Resource Strain (CARDIA)    How hard is it for you to pay for the very basics like food, housing, medical care, and heating?: Somewhat hard  Food Insecurity: No Food Insecurity (10/19/2023)   Hunger Vital Sign    Worried About Running Out of Food in the Last Year: Never true    Ran  Out of Food in the Last Year: Never true  Recent Concern: Food Insecurity - Food Insecurity Present (10/05/2023)   Received from [facility]   Hunger Vital Sign    Within the past 12 months, you worried that your food would run out before you got the money to buy more.: Sometimes true    Within the past 12 months, the food you bought just didn't last and you didn't have money to get more.: Sometimes true  Transportation Needs: No Transportation Needs (10/19/2023)   PRAPARE - Administrator, Civil Service (Medical): No    Lack of Transportation (Non-Medical): No  Physical Activity: Inactive (10/05/2023)   Received from [facility]   Exercise Vital Sign    On average, how many days per week do you engage in moderate to strenuous exercise (like a brisk walk)?: 0 days    Minutes of Exercise per Session: Not on file  Stress: Stress Concern Present (10/05/2023)    Received from [facility] - Occupational Stress Questionnaire    Do you feel stress - tense, restless, nervous, or anxious, or unable to sleep at night because your mind is troubled all the time - these days?: Rather much  Social Connections: Somewhat Isolated (10/05/2023)   Received from [facility]   Social Network    How would you rate your social network (family, work, friends)?: Restricted participation with some degree of social isolation  Intimate Partner Violence: Not At Risk (10/19/2023)   Humiliation, Afraid, Rape, and Kick questionnaire    Fear of Current or Ex-Partner: No    Emotionally Abused: No    Physically Abused: No    Sexually Abused: No    FAMILY HISTORY: Family History  Problem Relation Age of Onset   Clotting disorder Mother      PHYSICAL EXAMINATION: ECOG PERFORMANCE STATUS: 4 - Bedbound  Vitals:   10/25/23 0353 10/25/23 1252  BP: 100/87 105/65  Pulse:  96  Resp: 20 19  Temp: 97.6 F (36.4 C) 97.7 F (36.5 C)  SpO2: 95%    Filed Weights   10/19/23 1456  Weight: 216 lb 4.3 oz (98.1 kg)    GENERAL: alert, no distress and comfortable +ill appearing +right anterior chest wall swelling noted SKIN: skin color, texture, turgor are normal, no rashes or significant lesions EYES: normal, conjunctiva are pink and non-injected, sclera clear OROPHARYNX: no exudate, no erythema and lips, buccal mucosa, and tongue normal  NECK: supple, thyroid  normal size, non-tender, without nodularity LYMPH: no palpable lymphadenopathy in the cervical, axillary or inguinal LUNGS: clear to auscultation and percussion with normal breathing effort HEART: regular rate & rhythm and no murmurs and no lower extremity edema ABDOMEN: abdomen soft, non-tender and normal bowel sounds MUSCULOSKELETAL: no cyanosis of digits and no clubbing  PSYCH: alert & oriented x 3 with fluent speech +questionable orientation    ALLERGIES:  is  allergic to bicillin c-r, penicillins, prednisone , and nsaids.  MEDICATIONS:  Current Facility-Administered Medications  Medication Dose Route Frequency Provider Last Rate Last Admin   acetaminophen  (TYLENOL ) tablet 650 mg  650 mg Oral Q6H PRN Smith, Rondell A, MD   650 mg at 10/21/23 1553   Or   acetaminophen  (TYLENOL ) suppository 650 mg  650 mg Rectal Q6H PRN Claudene Reeves A, MD       albuterol  (VENTOLIN  HFA) 108 (90 Base) MCG/ACT inhaler 1-2 puff  1-2 puff Inhalation Q6H Amin, Ankit C, MD   2  puff at 10/25/23 1330   cefTRIAXone  (ROCEPHIN ) 2 g in sodium chloride  0.9 % 100 mL IVPB  2 g Intravenous Q24H Gherghe, Costin M, MD   Stopped at 10/24/23 1347   cyclobenzaprine  (FLEXERIL ) tablet 5 mg  5 mg Oral TID PRN Amin, Ankit C, MD   5 mg at 10/25/23 1023   furosemide  (LASIX ) tablet 40 mg  40 mg Oral Once Amin, Ankit C, MD       glucagon  (human recombinant) (GLUCAGEN) injection 1 mg  1 mg Intravenous PRN Amin, Ankit C, MD       heparin  ADULT infusion 100 units/mL (25000 units/250mL)  1,100 Units/hr Intravenous Continuous Denna Harlene BROCKS, RPH 11 mL/hr at 10/25/23 0643 1,100 Units/hr at 10/25/23 9356   hydrALAZINE  (APRESOLINE ) injection 10 mg  10 mg Intravenous Q4H PRN Amin, Ankit C, MD       HYDROcodone -acetaminophen  (NORCO/VICODIN) 5-325 MG per tablet 1 tablet  1 tablet Oral Q6H PRN Claudene Reeves A, MD   1 tablet at 10/24/23 1437   ipratropium-albuterol  (DUONEB) 0.5-2.5 (3) MG/3ML nebulizer solution 3 mL  3 mL Nebulization Q4H PRN Amin, Ankit C, MD       lactulose  (CHRONULAC ) 10 GM/15ML solution 20 g  20 g Oral BID Swayze, Ava, DO   20 g at 10/24/23 1633   liver oil-zinc  oxide (DESITIN) 40 % ointment   Topical Daily Swayze, Ava, DO   Given at 10/24/23 1145   metoprolol  tartrate (LOPRESSOR ) injection 5 mg  5 mg Intravenous Q4H PRN Amin, Ankit C, MD       metroNIDAZOLE  (FLAGYL ) IVPB 500 mg  500 mg Intravenous Q12H Gherghe, Costin M, MD 100 mL/hr at 10/25/23 1302 500 mg at 10/25/23 1302    ondansetron  (ZOFRAN ) injection 4 mg  4 mg Intravenous Q6H PRN Amin, Ankit C, MD       phenol (CHLORASEPTIC) mouth spray 1 spray  1 spray Mouth/Throat PRN Trixie Nilda HERO, MD   1 spray at 10/22/23 1306   polyethylene glycol (MIRALAX  / GLYCOLAX ) packet 17 g  17 g Oral Daily Sundil, Subrina, MD   17 g at 10/23/23 0950   senna-docusate (Senokot-S) tablet 1 tablet  1 tablet Oral BID Sundil, Subrina, MD   1 tablet at 10/24/23 2309   sodium chloride  flush (NS) 0.9 % injection 3 mL  3 mL Intravenous Q12H Smith, Rondell A, MD   3 mL at 10/22/23 2113   traZODone  (DESYREL ) tablet 50 mg  50 mg Oral QHS PRN Amin, Ankit C, MD       warfarin (COUMADIN ) tablet 5 mg  5 mg Oral ONCE-1600 Gretel Prentice BIRCH, [facility]       Warfarin - Pharmacist Dosing Inpatient   Does not apply q1600 Denna Harlene BROCKS, [facility]         LABORATORY DATA:  I have reviewed the data as listed Lab Results  Component Value Date   WBC 5.8 10/24/2023   HGB 9.7 (L) 10/24/2023   HCT 30.4 (L) 10/24/2023   MCV 87.4 10/24/2023   PLT 194 10/24/2023   Recent Labs    10/18/23 1630 10/20/23 0246 10/22/23 0422 10/23/23 0617 10/24/23 0150 10/25/23 1237  NA 136   < > 138 135 135 137  K 3.8   < > 4.2 4.2 4.2 3.8  CL 100   < > 102 100 100 103  CO2 24   < > 24 25 24 24   GLUCOSE 105*   < > 96 99 107* 100*  BUN 14   < >  11 8 10 11   CREATININE 0.71   < > 0.71 0.77 0.75 0.78  CALCIUM  9.6   < > 9.0 9.0 9.0 9.1  GFRNONAA >60   < > >60 >60 >60 >60  PROT 7.8  --  7.1 7.0 7.0  --   ALBUMIN 3.9  --  3.0* 2.8* 2.9*  --   AST 21  --  17 21 20   --   ALT 13  --  16 16 16   --   ALKPHOS 76  --  57 58 59  --   BILITOT 0.5  --  0.6 0.5 0.5  --   BILIDIR 0.2  --   --   --   --   --   IBILI 0.3  --   --   --   --   --    < > = values in this interval not displayed.    RADIOGRAPHIC STUDIES: I have personally reviewed the radiological images as listed and agreed with the findings in the report. DG CHEST PORT 1 VIEW Result Date: 10/24/2023 CLINICAL DATA:   Shortness of breath. EXAM: PORTABLE CHEST 1 VIEW COMPARISON:  Chest radiograph dated 10/22/2023. FINDINGS: Cavitary mass in the right upper lobe measures 5.2 cm in diameter. No new consolidation. There is no pleural effusion pneumothorax. The cardiac silhouette is within normal limits. No acute osseous pathology. IMPRESSION: Cavitary mass in the right upper lobe. Electronically Signed   By: Vanetta Chou M.D.   On: 10/24/2023 13:50   DG CHEST PORT 1 VIEW Result Date: 10/22/2023 CLINICAL DATA:  Status post bronchoscopy EXAM: PORTABLE CHEST 1 VIEW COMPARISON:  October 18, 2023 FINDINGS: Redemonstration of cavitary right upper lobe lung mass. No new consolidations. No pleural effusions. No pneumothorax. Unchanged cardiomediastinal silhouette with known right paratracheal lymphadenopathy. No acute osseous findings. IMPRESSION: Unchanged right upper lobe cavitary lung mass and right paratracheal lymphadenopathy. No acute findings. Electronically Signed   By: Michaeline Blanch M.D.   On: 10/22/2023 11:58   ECHOCARDIOGRAM COMPLETE Result Date: 10/20/2023    ECHOCARDIOGRAM REPORT   Patient Name:   Andrea Mullen [place] Date of Exam: 10/20/2023 Medical Rec #:  [id number]        Height:       65.0 in Accession #:    [id number]       Weight:       216.3 lb Date of Birth:  1965-03-19        BSA:          2.045 m Patient Age:    57 years         BP:           104/73 mmHg Patient Gender: F                HR:           105 bpm. Exam Location:  Inpatient Procedure: 2D Echo, Cardiac Doppler and Color Doppler (Both Spectral and Color            Flow Doppler were utilized during procedure). Indications:    Pulmonary Embolus  History:        Patient has prior history of Echocardiogram examinations, most                 recent 11/09/2021. CAD, Stroke, Signs/Symptoms:Chest Pain; Risk                 Factors:Hypertension and Current Smoker.  Sonographer:    Juliene Rucks Referring  Phys: [id number] RONDELL A SMITH IMPRESSIONS  1. Left ventricular  ejection fraction, by estimation, is 60 to 65%. The left ventricle has normal function. The left ventricle has no regional wall motion abnormalities. Left ventricular diastolic parameters are consistent with Grade I diastolic dysfunction (impaired relaxation). Elevated left ventricular end-diastolic pressure.  2. Right ventricular systolic function was not well visualized. The right ventricular size is not well visualized.  3. The mitral valve is degenerative. Trivial mitral valve regurgitation. Moderate to severe mitral annular calcification.  4. The aortic valve was not adequately interrogated to determine stenosis - there is calcification of the valve and aortic root. Image 67 demonstrates an aortic valve gradient of at least 30 mmHg peak and 14 mmHg, mean.. The aortic valve has an indeterminant number of cusps. There is severe calcifcation of the aortic valve. Aortic valve regurgitation is trivial. Moderate to severe aortic valve stenosis.  5. The inferior vena cava is normal in size with greater than 50% respiratory variability, suggesting right atrial pressure of 3 mmHg. Comparison(s): Changes from prior study are noted. 11/09/2021: LVEF 60-65%, severe AS - possibly bicuspid aortic valve. Conclusion(s)/Recommendation(s): Consider further evaluation of the aortic valve with TEE. FINDINGS  Left Ventricle: Left ventricular ejection fraction, by estimation, is 60 to 65%. The left ventricle has normal function. The left ventricle has no regional wall motion abnormalities. The left ventricular internal cavity size was normal in size. There is  no left ventricular hypertrophy. Left ventricular diastolic parameters are consistent with Grade I diastolic dysfunction (impaired relaxation). Elevated left ventricular end-diastolic pressure. Right Ventricle: The right ventricular size is not well visualized. Right vetricular wall thickness was not well visualized. Right ventricular systolic function was not well visualized.  Left Atrium: Left atrial size was normal in size. Right Atrium: Right atrial size was normal in size. Pericardium: There is no evidence of pericardial effusion. Mitral Valve: The mitral valve is degenerative in appearance. Moderate to severe mitral annular calcification. Trivial mitral valve regurgitation. Tricuspid Valve: The tricuspid valve is not well visualized. Tricuspid valve regurgitation is not demonstrated. Aortic Valve: The aortic valve was not adequately interrogated to determine stenosis - there is calcification of the valve and aortic root. Image 67 demonstrates an aortic valve gradient of at least 30 mmHg peak and 14 mmHg, mean. The aortic valve has an  indeterminant number of cusps. There is severe calcifcation of the aortic valve. There is moderate aortic valve annular calcification. Aortic valve regurgitation is trivial. Moderate to severe aortic stenosis is present. Pulmonic Valve: The pulmonic valve was not well visualized. Pulmonic valve regurgitation is not visualized. Aorta: The aortic root and ascending aorta are structurally normal, with no evidence of dilitation. Venous: The inferior vena cava is normal in size with greater than 50% respiratory variability, suggesting right atrial pressure of 3 mmHg. IAS/Shunts: The interatrial septum was not well visualized.  LEFT VENTRICLE PLAX 2D LVIDd:         5.20 cm   Diastology LVIDs:         4.60 cm   LV e' medial:    4.24 cm/s LV PW:         0.80 cm   LV E/e' medial:  30.2 LV IVS:        0.90 cm   LV e' lateral:   7.07 cm/s LVOT diam:     1.70 cm   LV E/e' lateral: 18.1 LVOT Area:     2.27 cm  RIGHT VENTRICLE RV S prime:  5.34 cm/s LEFT ATRIUM           Index LA diam:      3.00 cm 1.47 cm/m LA Vol (A2C): 48.1 ml 23.52 ml/m LA Vol (A4C): 40.9 ml 20.00 ml/m   AORTA Ao Root diam: 2.60 cm MITRAL VALVE MV Area (PHT): 4.39 cm     SHUNTS MV Decel Time: 173 msec     Systemic Diam: 1.70 cm MV E velocity: 128.00 cm/s MV A velocity: 162.00 cm/s MV E/A  ratio:  0.79 Vinie Maxcy MD Electronically signed by Vinie Maxcy MD Signature Date/Time: 10/20/2023/6:31:01 PM    Final    VAS US  LOWER EXTREMITY VENOUS (DVT) Result Date: 10/20/2023  Lower Venous DVT Study Patient Name:  Andrea Mullen  Date of Exam:   10/20/2023 Medical Rec #: [id number]         Accession #:    [id number] Date of Birth: 1965/12/09         Patient Gender: F Patient Age:   86 years Exam Location:  [facility] Procedure:      VAS US  LOWER EXTREMITY VENOUS (DVT) Referring Phys: MAXIMINO SHARPS --------------------------------------------------------------------------------  Indications: Pulmonary embolism.  Performing Technologist: Elmarie Lindau, RVT  Examination Guidelines: A complete evaluation includes B-mode imaging, spectral Doppler, color Doppler, and power Doppler as needed of all accessible portions of each vessel. Bilateral testing is considered an integral part of a complete examination. Limited examinations for reoccurring indications may be performed as noted. The reflux portion of the exam is performed with the patient in reverse Trendelenburg.  +---------+---------------+---------+-----------+----------+--------------+ RIGHT    CompressibilityPhasicitySpontaneityPropertiesThrombus Aging +---------+---------------+---------+-----------+----------+--------------+ CFV      Full           Yes      Yes                                 +---------+---------------+---------+-----------+----------+--------------+ SFJ      Full                                                        +---------+---------------+---------+-----------+----------+--------------+ FV Prox  Full                                                        +---------+---------------+---------+-----------+----------+--------------+ FV Mid   Full                                                        +---------+---------------+---------+-----------+----------+--------------+ FV  DistalFull                                                        +---------+---------------+---------+-----------+----------+--------------+ PFV      Full                                                        +---------+---------------+---------+-----------+----------+--------------+  POP      Partial                                      Chronic        +---------+---------------+---------+-----------+----------+--------------+ PTV      Full                                                        +---------+---------------+---------+-----------+----------+--------------+ PERO     Full                                                        +---------+---------------+---------+-----------+----------+--------------+   +---------+---------------+---------+-----------+----------+--------------+ LEFT     CompressibilityPhasicitySpontaneityPropertiesThrombus Aging +---------+---------------+---------+-----------+----------+--------------+ CFV      Full           Yes      Yes                                 +---------+---------------+---------+-----------+----------+--------------+ SFJ      Full                                                        +---------+---------------+---------+-----------+----------+--------------+ FV Prox  Full                                                        +---------+---------------+---------+-----------+----------+--------------+ FV Mid   Full                                                        +---------+---------------+---------+-----------+----------+--------------+ FV DistalFull                                                        +---------+---------------+---------+-----------+----------+--------------+ PFV      Full                                                        +---------+---------------+---------+-----------+----------+--------------+ POP      Partial                                       Chronic        +---------+---------------+---------+-----------+----------+--------------+  PTV      Full                                                        +---------+---------------+---------+-----------+----------+--------------+ PERO     Full                                                        +---------+---------------+---------+-----------+----------+--------------+     Summary: RIGHT: - Findings consistent 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  - No cystic structure found in the popliteal fossa.  LEFT: - Findings consistent with chronic deep vein thrombosis involving the left popliteal vein.  - No cystic structure found in the popliteal fossa.  *See table(s) above for measurements and observations. Electronically signed by Lonni Gaskins MD on 10/20/2023 at 2:53:16 PM.    Final    MR Brain W and Wo Contrast Result Date: 10/19/2023 CLINICAL DATA:  Initial evaluation for dural venous sinus thrombosis, acute neuro deficit. EXAM: MRI HEAD WITHOUT AND WITH CONTRAST TECHNIQUE: Multiplanar, multiecho pulse sequences of the brain and surrounding structures were obtained without and with intravenous contrast. CONTRAST:  10mL GADAVIST  GADOBUTROL  1 MMOL/ML IV SOLN COMPARISON:  Comparison made with CT from earlier the same day as well as multiple previous exams. FINDINGS: Brain: Diffuse prominence of the CSF containing spaces compatible generalized cerebral atrophy. Patchy and confluent T2/FLAIR hyperintensity involving the periventricular deep white matter both cerebral hemispheres, consistent with chronic microvascular ischemic disease, moderately advanced in nature. Mild patchy involvement of the pons and cerebellum noted. Few remote lacunar infarcts noted about the bilateral basal ganglia. Remote right pontine infarct with a few scattered small remote bilateral cerebellar infarcts. 6 mm acute ischemic nonhemorrhagic infarcts seen involving the right  splenium (series 5, image 75). No other evidence for acute or subacute ischemia. Gray-white matter differentiation otherwise maintained. No acute intracranial hemorrhage. Multiple scattered chronic micro hemorrhages noted, likely hypertensive in nature. No mass lesion, midline shift, or mass effect. Mild ventricular prominence related to global parenchymal volume loss of hydrocephalus. No extra-axial fluid collection. Pituitary gland and suprasellar region within normal limits. There is an apparent 6 mm focus of enhancement involving the right pons (series 17, image 14). This is at the location of an underlying right pontine infarct as well as a few prominent chronic micro hemorrhages. Given this, finding is suspected to reflect enhancement related to a small of all vein subacute infarct or possibly artifact related to the adjacent microhemorrhage. No other abnormal enhancement elsewhere within the brain. Vascular: Major intracranial arterial vascular flow voids are maintained. Previously identified dural venous sinus thrombosis involving the right transverse and sigmoid sinuses with extension into the right jugular bulb and visualized proximal right internal jugular vein, corresponding with findings on recent exams. Overall, appearance is relatively similar. Skull and upper cervical spine: Craniocervical junction within normal limits. Heterogeneous and mildly decreased T1 signal intensity within the visualized bone marrow, nonspecific, but most commonly related to anemia, smoking or obesity. No scalp soft tissue abnormality. Sinuses/Orbits: Globes orbital soft tissues within normal limits. Mild chronic mucosal thickening noted about the paranasal sinuses. Moderate to large right mastoid and middle ear  effusion. Trace left mastoid effusion noted. Other: None. IMPRESSION: 1. 6 mm acute ischemic nonhemorrhagic infarct involving the right splenium. 2. 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 Overall, appearance is relatively stable from prior. 3. 6 mm focus of enhancement involving the right pons, favored to reflect enhancement related to a small subacute infarct or possibly artifact related to the adjacent microhemorrhage. Short interval follow-up MRI in 3 months suggested to ensure resolution and/or stability. 4. Underlying age-related cerebral atrophy with moderately advanced chronic microvascular ischemic disease, with a few scattered remote lacunar infarcts involving the bilateral basal ganglia, right pons, and bilateral cerebellar hemispheres. 5. Multiple scattered chronic micro hemorrhages, likely hypertensive in nature. Electronically Signed   By: Morene Hoard M.D.   On: 10/19/2023 23:23   CT VENOGRAM HEAD Result Date: 10/19/2023 CLINICAL DATA:  58 year old female status post fall. Pain. Prior stroke. Left side weakness and numbness. History of lung cancer. Head and neck CT CT with contrast yesterday demonstrating central venous thrombosis, right IJ thrombosis involving the right transverse and sigmoid sinus also. EXAM: CT VENOGRAM HEAD TECHNIQUE: Venographic phase images of the brain were obtained following the administration of intravenous contrast. Multiplanar reformats and maximum intensity projections were generated. RADIATION DOSE REDUCTION: This exam was performed according to the departmental dose-optimization program which includes automated exposure control, adjustment of the mA and/or kV according to patient size and/or use of iterative reconstruction technique. CONTRAST:  75mL OMNIPAQUE  IOHEXOL  300 MG/ML  SOLN COMPARISON:  Head and neck CT yesterday.  Brain MRI 11/09/2021. FINDINGS: CT HEAD Post-contrast only. Stable cerebral volume. Chronic lacunar infarcts in the bilateral brain. No evidence of intracranial mass effect, acute ventriculomegaly, acute  intracranial hemorrhage, acute cerebral edema. CTV HEAD Venous sinuses: Maintained enhancement and patency of the superior sagittal sinus, torcula, straight sinus, inferior sagittal sinus, internal cerebral veins. Maintained enhancement and patency of the left transverse, left sigmoid venous sinus, and visible left IJ bulb. Cavernous sinus enhancement appears symmetric and within normal limits also. Nonenhancing, thrombosed right IJ bulb, right sigmoid venous sinus, and distal half of the right transverse sinus (beginning on series 2, image 25. The medial right transverse sinus remains enhancing. The appearance is unchanged from head and neck CT yesterday. Anatomic variants: None significant. Other findings: Calcified atherosclerosis at the skull base. Review of the MIP images confirms the above findings IMPRESSION: 1. Stable Thrombosis of the right IJ, right sigmoid sinus, and lateral half of the right transverse dural venous sinus since the CT Head and Neck yesterday. 2. No other intracranial venous thrombosis identified. No intracranial mass effect, hemorrhage, or acute cerebral edema identified. Electronically Signed   By: VEAR Hurst M.D.   On: 10/19/2023 08:20   CT Soft Tissue Neck W Contrast Result Date: 10/18/2023 CLINICAL DATA:  Initial evaluation for acute facial swelling. EXAM: CT NECK WITH CONTRAST TECHNIQUE: Multidetector CT imaging of the neck was performed using the standard protocol following the bolus administration of intravenous contrast. RADIATION DOSE REDUCTION: This exam was performed according to the departmental dose-optimization program which includes automated exposure control, adjustment of the mA and/or kV according to patient size and/or use of iterative reconstruction technique. CONTRAST:  OMNIPAQUE  IOHEXOL  350 MG/ML SOLN COMPARISON:  None Available. FINDINGS: Pharynx and larynx: Oral cavity within normal limits. Oropharynx and nasopharynx within normal limits. No retropharyngeal  collection or swelling. Negative epiglottis. Hypopharynx and supraglottic  larynx within normal limits. Negative glottis. Subglottic airway clear. Salivary glands: Salivary glands including the parotid and submandibular glands are within normal limits. Thyroid : Normal. Lymph nodes: No enlarged or pathologic lymph nodes within the neck. Vascular: Occlusive thrombus seen involving the brachiocephalic vein, extending into the visualized SVC. The vessels are expanded with hazy inflammatory stranding, consistent with acute thrombus. Clot extends to partially involve the right greater than left subclavian veins as well as the right axillary vein. Cephalad extension with subocclusive thrombus extending throughout the right internal jugular vein, with extension into the cranial vault to involve the right transverse and sigmoid sinuses. Left IJ remains patent. Multiple prominent venous collaterals noted within the right neck and visualized right chest wall. Normal arterial enhancement seen throughout the neck. Aortic atherosclerosis noted. Limited intracranial: No acute finding. Visualized orbits: No acute finding. Mastoids and visualized paranasal sinuses: Mild mucoperiosteal thickening about the ethmoidal air cells and maxillary sinuses. Right mastoid and middle ear effusion. Left mastoid air cells are clear. Skeleton: No worrisome osseous lesions. Upper chest: Approximate 5 cm thick walled cavitary mass at the posterior right upper lobe, partially visualize, better evaluated on concomitant CT of the chest. Other: None. IMPRESSION: 1. Acute occlusive and subocclusive thrombus involving the brachiocephalic vein, extending into the visualized SVC. Clot extends to partially involve the right greater than left subclavian veins as well as the right axillary vein. Cephalad extension with subocclusive thrombus extending throughout the right internal jugular vein, with extension into the cranial vault to involve the right  transverse and sigmoid sinuses. 2. Approximate 5 cm thick walled cavitary mass at the posterior right upper lobe, partially visualized, better evaluated on concomitant CT of the chest. 3. Right mastoid and middle ear effusion, of uncertain significance. Correlation with physical exam recommended. 4.  Aortic Atherosclerosis (ICD10-I70.0). Electronically Signed   By: Morene Hoard M.D.   On: 10/18/2023 19:21   CT Head W or Wo Contrast Result Date: 10/18/2023 CLINICAL DATA:  Initial evaluation for acute headache, history of malignancy. EXAM: CT HEAD WITHOUT AND WITH CONTRAST TECHNIQUE: Contiguous axial images were obtained from the base of the skull through the vertex without and with intravenous contrast. RADIATION DOSE REDUCTION: This exam was performed according to the departmental dose-optimization program which includes automated exposure control, adjustment of the mA and/or kV according to patient size and/or use of iterative reconstruction technique. CONTRAST:  OMNIPAQUE  IOHEXOL  350 MG/ML SOLN COMPARISON:  Prior CT from 09/20/2023 FINDINGS: Brain: Generalized age-related cerebral atrophy. Patchy and confluent hypodensity involving the supratentorial cerebral white matter, consistent with chronic small vessel ischemic disease, moderate in nature. Multiple scattered remote lacunar infarcts noted about the bilateral basal ganglia right thalamus, stable. No acute intracranial hemorrhage. No acute large vessel territory infarct. No mass lesion or midline shift. No hydrocephalus or extra-axial fluid collection. Vascular: No abnormal hyperdense vessel seen prior to contrast administration. Calcified atherosclerosis present at the skull base. Following contrast administration, filling defect within the right transverse and sigmoid sinuses extending into the right jugular bulb and visualized proximal right internal jugular vein, consistent with dural venous sinus thrombosis. This appears to extend  inferiorly throughout the right IJ within the neck. Skull: Scalp soft tissues demonstrate no acute finding. Calvarium intact. Sinuses/Orbits: Globes orbital soft tissues within normal limits. Mild mucoperiosteal thickening present about the paranasal sinuses. Moderate right mastoid and middle ear effusion. Left mastoid air cells are clear. Other: None. IMPRESSION: 1. Filling defect within the right transverse and sigmoid sinuses extending into the right  jugular bulb and visualized proximal right internal jugular vein, consistent with dural venous sinus thrombosis. This extends inferiorly throughout the right IJ within the neck. 2. No other acute intracranial abnormality. 3. Moderate right mastoid and middle ear effusion, of uncertain significance. Correlation with physical exam recommended. 4. Underlying age-related cerebral atrophy with moderate chronic small vessel ischemic disease, with multiple remote lacunar infarcts about the bilateral basal ganglia and right thalamus. Electronically Signed   By: Morene Hoard M.D.   On: 10/18/2023 19:11   CT Angio Chest PE W and/or Wo Contrast Addendum Date: 10/18/2023 ADDENDUM REPORT: 10/18/2023 18:55 ADDENDUM: Results were discussed with Dr. Dreama at 6:41 p.m. [place] on October 18, 2023. Electronically Signed   By: Suzen Dials M.D.   On: 10/18/2023 18:55   Result Date: 10/18/2023 CLINICAL DATA:  History of lung cancer presenting with chest tightness. EXAM: CT ANGIOGRAPHY CHEST WITH CONTRAST TECHNIQUE: Multidetector CT imaging of the chest was performed using the standard protocol during bolus administration of intravenous contrast. Multiplanar CT image reconstructions and MIPs were obtained to evaluate the vascular anatomy. RADIATION DOSE REDUCTION: This exam was performed according to the departmental dose-optimization program which includes automated exposure control, adjustment of the mA and/or kV according to patient size and/or use of iterative  reconstruction technique. CONTRAST:  OMNIPAQUE  IOHEXOL  350 MG/ML SOLN COMPARISON:  October 14, 2023 FINDINGS: Cardiovascular: There is marked severity calcification of the aortic arch without evidence of aortic aneurysm or dissection. The segmental and subsegmental pulmonary arteries are limited in evaluation secondary to suboptimal opacification with intravenous contrast. As result, pulmonary embolism within the segmental branches of the right lower lobe cannot be excluded (axial CT images 132 through 142, CT series 6). Occlusive thrombus is noted within the brachiocephalic vein. This is present on the prior study. Nonocclusive thrombus is seen within the superior vena cava (axial CT images 39 through 45, CT series 5). Nonocclusive thrombus is also noted within the azygous vein (axial CT images 44 through 78, CT series 5). A partially thrombosed venous structure is seen within the anteromedial aspect of the upper right lung (axial CT image 44 through 53, CT series 5). Extensive, nonocclusive thrombus is noted within the right axillary vein and right subclavian vein (axial CT images 5 through 52, CT series 5) Numerous thin, tortuous, enhancing venous structures are seen within the anterior and posterior aspects of the chest wall on the right. Normal heart size with mild to moderate severity coronary artery calcification and marked severity calcification of the mitral annulus. No pericardial effusion. Mediastinum/Nodes: There is mild right hilar lymphadenopathy. Thyroid  gland, trachea, and esophagus demonstrate no significant findings. Lungs/Pleura: A 4.4 cm x 4.8 cm x 4.6 cm thick walled cavitary lesion is again seen within the posterior aspect of the right upper lobe. Mild anteromedial right middle lobe linear atelectasis is noted. No pleural effusion or pneumothorax identified. Upper Abdomen: There is a small hiatal hernia. Diffuse fatty infiltration of the liver parenchyma is noted. Noninflamed diverticula  are seen throughout the visualized portions of the large bowel. Musculoskeletal: No chest wall abnormality. No acute or significant osseous findings. Review of the MIP images confirms the above findings. IMPRESSION: 1. Limited evaluation of the segmental and subsegmental pulmonary arteries secondary to suboptimal opacification with intravenous contrast. As result, pulmonary embolism within the segmental branches of the right lower lobe cannot be excluded. 2. Occlusive thrombus within the brachiocephalic vein, nonocclusive thrombus within the superior vena cava and azygous vein, and extensive, nonocclusive thrombus within the  right axillary vein and right subclavian vein. 3. 4.4 cm x 4.8 cm x 4.6 cm thick walled cavitary lesion within the posterior aspect of the right upper lobe which is seen on the prior study and may be consistent with the patient's history of lung cancer. 4. Mild right hilar lymphadenopathy. 5. Small hiatal hernia. 6. Hepatic steatosis. 7. Colonic diverticulosis. 8. Aortic atherosclerosis. Electronically Signed: By: Suzen Dials M.D. On: 10/18/2023 18:52   DG Chest 2 View Result Date: 10/18/2023 CLINICAL DATA:  Chest pain EXAM: CHEST - 2 VIEW COMPARISON:  Chest radiograph October 14, 2023, CT angio chest October 14, 2023 FINDINGS: Thick-walled cavitary lesion with air-fluid level in right upper lobe, measuring approximately 5 cm. No new lesion or consolidation identified. Cardiomediastinal silhouette is enlarged. Fullness of right paratracheal correlating to mediastinal lymphadenopathy. No pleural effusion or pneumothorax. Multilevel degenerative changes of the spine. Nodular opacity superimposing lower thoracic spine on lateral view correlates to osteophytes better assessed on recent CT angio chest. IMPRESSION: Thick-walled cavitary lesion of right upper lobe. Right lower paratracheal mediastinal lymphadenopathy. Electronically Signed   By: Megan  Zare M.D.   On: 10/18/2023 14:34   CT  Angio Chest PE W and/or Wo Contrast Result Date: 10/14/2023 CLINICAL DATA:  Increased shortness of breath, history of lung cancer in 2008 normal pulmonary embolism suspected, positive D-dimer EXAM: CT ANGIOGRAPHY CHEST WITH CONTRAST TECHNIQUE: Multidetector CT imaging of the chest was performed using the standard protocol during bolus administration of intravenous contrast. Multiplanar CT image reconstructions and MIPs were obtained to evaluate the vascular anatomy. RADIATION DOSE REDUCTION: This exam was performed according to the departmental dose-optimization program which includes automated exposure control, adjustment of the mA and/or kV according to patient size and/or use of iterative reconstruction technique. CONTRAST:  75mL OMNIPAQUE  IOHEXOL  350 MG/ML SOLN COMPARISON:  Same day chest radiograph, November 09, 2021 CT FINDINGS: Cardiovascular: Satisfactory opacification of the pulmonary arteries to the segmental level. No evidence of pulmonary embolism. No pericardial effusion. Nonspecific narrowing of left brachiocephalic vein and upper SVC due to mediastinal lymphadenopathy with venous reflux into the left chest wall collaterals. Mediastinum/Nodes: Bulky right paratracheal lymphadenopathy, measuring up to 3.1 x 2.5 cm there are also some mildly prominent, subcentimeter bilateral supraclavicular lymph nodes. Lungs/Pleura: Thick-walled, cavitary right upper lobe mass measuring approximately 5.7 x 5.3 cm. There was a small pulmonary nodule at this location on prior CT performed November 09, 2021. No pleural effusions. No pneumothorax. Upper Abdomen: No acute findings. Musculoskeletal: No acute osseous findings. Review of the MIP images confirms the above findings. IMPRESSION: 1. Cavitary right upper lobe mass and bulky mediastinal lymphadenopathy, concerning for primary lung malignancy. Tissue biopsy may be helpful to confirm diagnosis and exclude possible mycobacterial infection. 2. Nonspecific narrowing of upper  SVC and brachiocephalic veins due to mediastinal lymphadenopathy. 3. Nonspecific, mildly prominent subcentimeter bilateral supraclavicular lymph nodes, felt to be reactive related to the central venous stenosis, but difficult to exclude metastatic disease. Electronically Signed   By: Michaeline Blanch M.D.   On: 10/14/2023 12:49   DG Chest 2 View Result Date: 10/14/2023 CLINICAL DATA:  SOB EXAM: CHEST - 2 VIEW COMPARISON:  January 29, 2022 FINDINGS: Thick-walled cavity in the right upper lobe, measuring 5.5 cm. No lobar consolidation, pleural effusion, or pneumothorax. Fullness of the right suprahilar region. No cardiomegaly. Aortic atherosclerosis. No acute fracture or destructive lesions. Multilevel thoracic osteophytosis. IMPRESSION: 1. Thick-walled cavity in the right upper lobe, measuring 5.5 cm. This may represent either TB or neoplasm. Appropriate  isolation precautions. 2. Fullness of the right suprahilar region is also present, worrisome for lymphadenopathy. A follow-up chest CT with IV contrast recommended for further characterization. Critical Value/emergent results were called by telephone at the time of interpretation on 10/14/2023 at 8:36 am to provider ANDREW TEE , who verbally acknowledged these results. Electronically Signed   By: Rogelia Myers M.D.   On: 10/14/2023 08:37     The total time spent in the appointment was 55 minutes encounter with patients including review of chart and various tests results, discussions about plan of care and coordination of care plan   All questions were answered. The patient knows to call the clinic with any problems, questions or concerns. No barriers to learning was detected.  Olam JINNY Brunner, NP 9/15/20252:35 PM

## 2023-10-25 NOTE — Plan of Care (Signed)

## 2023-10-25 NOTE — Telephone Encounter (Signed)
 Patient scheduled.

## 2023-10-25 NOTE — Progress Notes (Signed)
 Pt refused am lab draw.  Requested lab try again around 6am.  Lab agreeable.

## 2023-10-25 NOTE — Progress Notes (Signed)
 PT Cancellation Note  Patient Details Name: Andrea Mullen MRN: [id number] DOB: 12/09/1965   Cancelled Treatment:    Reason Eval/Treat Not Completed: (P) Other (comment) (RN defer treatment attempt until later in day due to pt refusing meds/labs and interventions this AM.) Will continue efforts per PT plan of care later in the day as schedule permits.   Connell HERO Andrea Mullen 10/25/2023, 10:54 AM

## 2023-10-25 NOTE — Progress Notes (Signed)
 Mobility Specialist Progress Note:    10/25/23 1028  Mobility  Activity Ambulated with assistance  Level of Assistance Contact guard assist, steadying assist  Assistive Device Front wheel walker  Distance Ambulated (ft) 50 ft  Activity Response Tolerated fair  Mobility Referral Yes  Mobility visit 1 Mobility  Mobility Specialist Start Time (ACUTE ONLY) 1028  Mobility Specialist Stop Time (ACUTE ONLY) 1051  Mobility Specialist Time Calculation (min) (ACUTE ONLY) 23 min   Pt agreeable to session. No c/o any symptoms. Pt req' CGA to go sit>stand and SV to ambulate. Cue pt to take larger steps and open up more rather than the short, choppy steps. Returned pt to room w/ all needs met and MD waiting to speak w/ her.  Venetia Keel Mobility Specialist Please Neurosurgeon or Rehab Office at [phone]

## 2023-10-25 NOTE — Discharge Instructions (Addendum)
 Information on my medicine - ELIQUIS  (apixaban )  This medication education was reviewed with me or my healthcare representative as part of my discharge preparation.   Why was Eliquis  prescribed for you? Eliquis  was prescribed to treat blood clots that may have been found in the veins of your legs (deep vein thrombosis) and in other blood vessels, and to reduce the risk of them occurring again.  What do You need to know about Eliquis  ? Your dose is ONE 5 mg tablet taken TWICE daily.  Eliquis  may be taken with or without food.   Try to take the dose about the same time in the morning and in the evening. If you have difficulty swallowing the tablet whole please discuss with your pharmacist how to take the medication safely.  Take Eliquis  exactly as prescribed and DO NOT stop taking Eliquis  without talking to the doctor who prescribed the medication.  Stopping may increase your risk of developing a new blood clot.  Refill your prescription before you run out.  After discharge, you should have regular check-up appointments with your healthcare provider that is prescribing your Eliquis .    What do you do if you miss a dose? If a dose of ELIQUIS  is not taken at the scheduled time, take it as soon as possible on the same day and twice-daily administration should be resumed. The dose should not be doubled to make up for a missed dose.  Important Safety Information A possible side effect of Eliquis  is bleeding. You should call your healthcare provider right away if you experience any of the following: Bleeding from an injury or your nose that does not stop. Unusual colored urine (red or dark brown) or unusual colored stools (red or black). Unusual bruising for unknown reasons. A serious fall or if you hit your head (even if there is no bleeding).  Some medicines may interact with Eliquis  and might increase your risk of bleeding or clotting while on Eliquis . To help avoid this, consult  your healthcare provider or pharmacist prior to using any new prescription or non-prescription medications, including herbals, vitamins, non-steroidal anti-inflammatory drugs (NSAIDs) and supplements.  This website has more information on Eliquis  (apixaban ): http://[email or web address] .com/eliquis dena

## 2023-10-25 NOTE — Progress Notes (Addendum)
 PROGRESS NOTE    Andrea Mullen  FMW:[id number] DOB: [birth year] DOA: 10/18/2023 PCP: [facility]    Brief Narrative:  58 year old with history of HTN, HLD, lung cancer s/p radiation/chemo, left eye blindness presented for chest pain and dyspnea.  Patient found to have head and neck thrombosis/dural sinus thrombosis and acute CVA.  Neurology and vascular surgery were consulted and patient was started on IV heparin .  Patient was also found to have right-sided cavitary lesion therefore pulmonary team was consulted.  Underwent bronchoscopy and pathology was sent.  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  Also found to have occlusive thrombus in the right brachiocephalic vein.  CT head and neck showed subocclusive right intraventricular/dural venous sinus thrombosis. - Started on heparin  drip with plans to transition to Coumadin  Discussed with Oncology, Dr Autumn.     Cavitary lesion right upper lung Bulky lymphadenopathy Hx of Small cell Carcinoma, tx at [facility] in 2007? -Right upper lung lesion measuring 4.4 cm x 4.8 cm x 4.6 cm. Complicated by history of small cell lung cancer.  -Seen by pulmonary, underwent bronchoscopy, prelim is pending and will follow-up outpatient. -On empiric Rocephin /Flagyl , will plan total 10 days of antibiotics EOT 9/21    History of small cell lung cancer  - Noted.  Bronchoscopy primary pathology negative, but final pathology is pending.   Left leg weakness  - PT/OT consulted.   Acute stroke  - MRI confirms a 6 mm acute ischemic nonhemorrhagic infarct involving the right splenium and a 6 mm focus of enhancement involving the  right pons.  Per neuro the etiology is likely small vessel disease.  LDL 182   Aortic valve stenosis Echo shows EF 65%, grade 1 DD, moderate to severe mitral valve calcification   Normocytic anemia  -hemoglobin stable, no bleeding.   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   Patient frequently declining any blood draws and her medical care.  I will discuss with IR to see if they are able to place any form of central line  DVT prophylaxis: Heparin  drip > Coumadin     Code Status: Full Code Family Communication: Daughter at bedside Status is: Inpatient Remains inpatient appropriate because: Heparin  to Coumadin  bridge   PT Follow up Recs: Home Health Pt9/11/2023 1144  Subjective: Seen at bedside.  She was ambulating with therapy staff.   Declining blood draws and some of her medical care.  I explained to her the necessity of blood draw especially while she is on anticoagulation.  Daughter is at bedside and she agrees with me as well.  Discussed with RN Examination:  General exam: Appears calm and comfortable  Respiratory system: Mild coarse breath sounds bilaterally Cardiovascular system: S1 & S2 heard, RRR. No JVD, murmurs, rubs, gallops or clicks. No pedal edema. Gastrointestinal system: Abdomen is nondistended, soft and nontender. No organomegaly or masses felt. Normal bowel sounds heard. Central nervous system: Alert and oriented. No focal neurological deficits. Extremities: Symmetric 5 x 5 power. Skin: No rashes, lesions or ulcers Psychiatry: Judgement and insight appear normal. Mood & affect appropriate.                Diet Orders (From admission, onward)     Start     Ordered  10/22/23 1107  Diet Heart Room service appropriate? Yes; Fluid consistency: Thin  Diet effective now       Question Answer Comment  Room service appropriate? Yes   Fluid consistency: Thin      10/22/23 1106            Objective: Vitals:   10/24/23 1923 10/24/23 2328 10/25/23 0353  10/25/23 1252  BP: (!) 84/69 (!) 94/59 100/87 105/65  Pulse: 87 97  96  Resp: 20 19 20 19   Temp: 98.4 F (36.9 C) 98.6 F (37 C) 97.6 F (36.4 C) 97.7 F (36.5 C)  TempSrc: Oral Oral Oral Oral  SpO2: 92% 94% 95%   Weight:      Height:        Intake/Output Summary (Last 24 hours) at 10/25/2023 1334 Last data filed at 10/25/2023 0643 Gross per 24 hour  Intake 1085.26 ml  Output 625 ml  Net 460.26 ml   Filed Weights   10/19/23 1456  Weight: 98.1 kg    Scheduled Meds:  albuterol   1-2 puff Inhalation Q6H   lactulose   20 g Oral BID   liver oil-zinc  oxide   Topical Daily   polyethylene glycol  17 g Oral Daily   senna-docusate  1 tablet Oral BID   sodium chloride  flush  3 mL Intravenous Q12H   Warfarin - Pharmacist Dosing Inpatient   Does not apply q1600   Continuous Infusions:  cefTRIAXone  (ROCEPHIN )  IV Stopped (10/24/23 1347)   heparin  1,100 Units/hr (10/25/23 9356)   metronidazole  500 mg (10/25/23 1302)    Nutritional status     Body mass index is 35.99 kg/m.  Data Reviewed:   CBC: Recent Labs  Lab 10/18/23 1630 10/20/23 0246 10/21/23 0244 10/22/23 0422 10/23/23 0617 10/24/23 0150  WBC 7.1  7.0 5.1 4.8 4.1 5.3 5.8  NEUTROABS 5.4  --   --   --   --   --   HGB 10.8*  10.8* 10.0* 10.1* 9.7* 10.2* 9.7*  HCT 34.3*  33.8* 31.3* 32.0* 31.0* 31.7* 30.4*  MCV 87.5  86.7 86.7 86.5 88.1 86.6 87.4  PLT 187  170 160 185 206 194 194   Basic Metabolic Panel: Recent Labs  Lab 10/18/23 1630 10/20/23 0246 10/22/23 0422 10/23/23 0617 10/24/23 0150  NA 136 134* 138 135 135  K 3.8 3.9 4.2 4.2 4.2  CL 100 102 102 100 100  CO2 24 23 24 25 24   GLUCOSE 105* 95 96 99 107*  BUN 14 10 11 8 10   CREATININE 0.71 0.67 0.71 0.77 0.75  CALCIUM  9.6 8.9 9.0 9.0 9.0  MG  --   --  2.0 1.8  --    GFR: Estimated Creatinine Clearance: 89.9 mL/min (by C-G formula based on SCr of 0.75 mg/dL). Liver Function Tests: Recent Labs  Lab 10/18/23 1630 10/22/23 0422  10/23/23 0617 10/24/23 0150  AST 21 17 21 20   ALT 13 16 16 16   ALKPHOS 76 57 58 59  BILITOT 0.5 0.6 0.5 0.5  PROT 7.8 7.1 7.0 7.0  ALBUMIN 3.9 3.0* 2.8* 2.9*   No results for input(s): LIPASE, AMYLASE in the last 168 hours. No results for input(s): AMMONIA in the last 168 hours. Coagulation Profile: Recent Labs  Lab 10/24/23 1209  INR 1.1   Cardiac Enzymes: No results for input(s): CKTOTAL, CKMB, CKMBINDEX, TROPONINI in the last 168 hours. BNP (last 3 results) Recent Labs    10/18/23 1630  PROBNP 781.0*   HbA1C: No results  for input(s): HGBA1C in the last 72 hours. CBG: No results for input(s): GLUCAP in the last 168 hours. Lipid Profile: No results for input(s): CHOL, HDL, LDLCALC, TRIG, CHOLHDL, LDLDIRECT in the last 72 hours. Thyroid  Function Tests: No results for input(s): TSH, T4TOTAL, FREET4, T3FREE, THYROIDAB in the last 72 hours. Anemia Panel: No results for input(s): VITAMINB12, FOLATE, FERRITIN, TIBC, IRON, RETICCTPCT in the last 72 hours. Sepsis Labs: No results for input(s): PROCALCITON, LATICACIDVEN in the last 168 hours.  Recent Results (from the past 240 hours)  MRSA Next Gen by PCR, Nasal     Status: None   Collection Time: 10/19/23  1:55 PM   Specimen: Nasal Mucosa; Nasal Swab  Result Value Ref Range Status   MRSA by PCR Next Gen NOT DETECTED NOT DETECTED Final    Comment: (NOTE) The GeneXpert MRSA Assay (FDA approved for NASAL specimens only), is one component of a comprehensive MRSA colonization surveillance program. It is not intended to diagnose MRSA infection nor to guide or monitor treatment for MRSA infections. Test performance is not FDA approved in patients less than 52 years old. Performed at [facility], [address], [place], [place] [address]   Surgical pcr screen     Status: None   Collection Time: 10/21/23  8:25 PM   Specimen: Nasal Mucosa; Nasal Swab  Result Value  Ref Range Status   MRSA, PCR NEGATIVE NEGATIVE Final   Staphylococcus aureus NEGATIVE NEGATIVE Final    Comment: (NOTE) The Xpert SA Assay (FDA approved for NASAL specimens in patients 55 years of age and older), is one component of a comprehensive surveillance program. It is not intended to diagnose infection nor to guide or monitor treatment. Performed at [facility], [address], [place], [place] [address]   Culture, BAL-quantitative w Gram Stain     Status: Abnormal   Collection Time: 10/22/23  8:48 AM   Specimen: Bronchial Alveolar Lavage; Respiratory  Result Value Ref Range Status   Specimen Description BRONCHIAL ALVEOLAR LAVAGE  Final   Special Requests NONE  Final   Gram Stain NO WBC SEEN NO ORGANISMS SEEN   Final   Culture (A)  Final    20,000 COLONIES/mL Normal respiratory flora-no Staph aureus or Pseudomonas seen Performed at [facility], [address], [place], [place] [address]    Report Status 10/25/2023 FINAL  Final  Acid Fast Smear (AFB)     Status: None   Collection Time: 10/22/23  8:48 AM   Specimen: Bronchial Alveolar Lavage; Respiratory  Result Value Ref Range Status   AFB Specimen Processing Concentration  Final   Acid Fast Smear Negative  Final    Comment: (NOTE) Performed At: [facility] [address] [place], [place] [id number] Jennette Shorter MD Ey:[id number]    Source (AFB) BRONCHIAL ALVEOLAR LAVAGE  Final    Comment: Performed at [facility], [address], [place], [place] [address]         Radiology Studies: DG CHEST PORT 1 VIEW Result Date: 10/24/2023 CLINICAL DATA:  Shortness of breath. EXAM: PORTABLE CHEST 1 VIEW COMPARISON:  Chest radiograph dated 10/22/2023. FINDINGS: Cavitary mass in the right upper lobe measures 5.2 cm in diameter. No new consolidation. There is no pleural effusion pneumothorax. The cardiac silhouette is within normal limits. No acute osseous pathology. IMPRESSION: Cavitary mass in  the right upper lobe. Electronically Signed   By: Vanetta Chou M.D.   On: 10/24/2023 13:50  LOS: 6 days   Time spent= 35 mins    Burgess JAYSON Dare, MD [facility]  If 7PM-7AM, please contact night-coverage  10/25/2023, 1:34 PM

## 2023-10-25 NOTE — Progress Notes (Addendum)
 PHARMACY - ANTICOAGULATION CONSULT NOTE  Pharmacy Consult for heparin  + add Coumadin  Indication: Dural venous thrombus  Allergies  Allergen Reactions   Bicillin C-R Hives   Penicillins Hives        Prednisone  Anaphylaxis    seizure   Nsaids Hives    Hives     Patient Measurements: Height: 5' 5 (165.1 cm) Weight: 98.1 kg (216 lb 4.3 oz) IBW/kg (Calculated) : 57 HEPARIN  DW (KG): 79.3  Vital Signs: Temp: 97.6 F (36.4 C) (09/15 0353) Temp Source: Oral (09/15 0353) BP: 100/87 (09/15 0353)  Labs: Recent Labs    10/22/23 2347 10/23/23 0617 10/24/23 0150 10/24/23 1209  HGB  --  10.2* 9.7*  --   HCT  --  31.7* 30.4*  --   PLT  --  194 194  --   LABPROT  --   --   --  15.3*  INR  --   --   --  1.1  HEPARINUNFRC 0.21* 0.34  --  0.39  CREATININE  --  0.77 0.75  --     Estimated Creatinine Clearance: 89.9 mL/min (by C-G formula based on SCr of 0.75 mg/dL).  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  She is not on anticoagulation PTA. 9/9 MRI with acute infarct, therefore targeting lower heparin  level goal.  -heparin  level 0.41 and at goal on 1100 units/hr -INR = 1.2  Goal of Therapy:  Heparin  level 0.3-0.5 units/ml Monitor platelets by anticoagulation protocol: Yes INR 2 to 3   Plan:  -Continue IV Heparin  at 1100 units/hr -Repeat  Coumadin  with 5 mg x 1 tonight -will need to be cautious given potential interaction with flagyl . -Daily heparin  level, CBC and INR.  Prentice Poisson, PharmD Clinical Pharmacist **Pharmacist phone directory can now be found on [email or web address] ([email or web address]).  Listed under [facility].

## 2023-10-25 NOTE — Progress Notes (Signed)
 Pt refused morning labs.

## 2023-10-25 NOTE — Telephone Encounter (Signed)
 Thank you :)

## 2023-10-26 ENCOUNTER — Telehealth (HOSPITAL_COMMUNITY): Payer: Self-pay | Admitting: Pharmacy Technician

## 2023-10-26 ENCOUNTER — Other Ambulatory Visit (HOSPITAL_COMMUNITY): Payer: Self-pay

## 2023-10-26 ENCOUNTER — Inpatient Hospital Stay (HOSPITAL_COMMUNITY)

## 2023-10-26 DIAGNOSIS — G08 Intracranial and intraspinal phlebitis and thrombophlebitis: Secondary | ICD-10-CM | POA: Diagnosis not present

## 2023-10-26 LAB — CYTOLOGY - NON PAP

## 2023-10-26 LAB — BASIC METABOLIC PANEL WITH GFR
Anion gap: 9 (ref 5–15)
BUN: 10 mg/dL (ref 6–20)
CO2: 26 mmol/L (ref 22–32)
Calcium: 9 mg/dL (ref 8.9–10.3)
Chloride: 102 mmol/L (ref 98–111)
Creatinine, Ser: 0.8 mg/dL (ref 0.44–1.00)
GFR, Estimated: >60 mL/min
Glucose, Bld: 106 mg/dL — ABNORMAL HIGH (ref 70–99)
Potassium: 4.1 mmol/L (ref 3.5–5.1)
Sodium: 137 mmol/L (ref 135–145)

## 2023-10-26 LAB — CBC
HCT: 32.1 % — ABNORMAL LOW (ref 36.0–46.0)
Hemoglobin: 9.9 g/dL — ABNORMAL LOW (ref 12.0–15.0)
MCH: 27.4 pg (ref 26.0–34.0)
MCHC: 30.8 g/dL (ref 30.0–36.0)
MCV: 88.9 fL (ref 80.0–100.0)
Platelets: 196 K/uL (ref 150–400)
RBC: 3.61 MIL/uL — ABNORMAL LOW (ref 3.87–5.11)
RDW: 15.1 % (ref 11.5–15.5)
WBC: 6.6 K/uL (ref 4.0–10.5)
nRBC: 0 % (ref 0.0–0.2)

## 2023-10-26 LAB — HEPARIN LEVEL (UNFRACTIONATED): Heparin Unfractionated: 0.33 [IU]/mL (ref 0.30–0.70)

## 2023-10-26 MED ORDER — LIDOCAINE-EPINEPHRINE 1 %-1:100000 IJ SOLN
INTRAMUSCULAR | Status: AC
  Filled 2023-10-26: qty 1

## 2023-10-26 MED ORDER — LIDOCAINE-EPINEPHRINE 1 %-1:100000 IJ SOLN: 20.0000 mL | Freq: Once | INTRAMUSCULAR | Status: DC

## 2023-10-26 NOTE — Plan of Care (Signed)
  Problem: Health Behavior/Discharge Planning: Goal: Ability to manage health-related needs will improve Outcome: Progressing   Problem: Activity: Goal: Risk for activity intolerance will decrease Outcome: Progressing   Problem: Nutrition: Goal: Adequate nutrition will be maintained Outcome: Progressing   Problem: Safety: Goal: Ability to remain free from injury will improve Outcome: Progressing

## 2023-10-26 NOTE — Progress Notes (Signed)
 Request to IR for left midline placement due to frequent blood draws and medications.   Discussed procedure extensively with patient and her daughter (via phone) today while in IR. [name] states she does not want to have the procedure done due to fear of pain from the needle puncture for numbing and fear of dying during the procedure. She declines to discuss these concerns further with me today and asks that she be returned to her room.   Patient's daughter will continue to discuss this procedure and encourage her mother to proceed as recommended by her primary team.   IR will re-attempt midline placement tomorrow.  Andrea Hesselbach, PA-C

## 2023-10-26 NOTE — Progress Notes (Signed)
 PROGRESS NOTE    Andrea PLACZEK  FMW:[id number] DOB: 07/05/1965 DOA: 10/18/2023 PCP: [facility]    Brief Narrative:  58 year old with history of HTN, HLD, lung cancer s/p radiation/chemo, left eye blindness presented for chest pain and dyspnea.  Patient found to have head and neck thrombosis/dural sinus thrombosis and acute CVA.  Neurology and vascular surgery were consulted and patient was started on IV heparin .  Patient was also found to have right-sided cavitary lesion therefore pulmonary team was consulted.  Underwent bronchoscopy and pathology was sent.  Currently on empiric antibiotics for total of 10 days.  Oncology was consulted, pending pathology results.   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  CTA chest revealed cavitary mass with bulky lymphadenopathy, subsegmental PE, nonocclusive SVC thrombus, right azygous vein, right axillary vein, bilateral subclavian thrombus.  Also found to have occlusive thrombus in the right brachiocephalic vein.  CT head and neck showed subocclusive right intraventricular/dural venous sinus thrombosis.  Oncology team is following.  Currently on heparin  drip.  Will eventually transition to subcu Lovenox .  Discussed with neurology and oncology.  Patient is agreeable to subcu Lovenox .  Cost is roughly $4 a month.  If need to use DOAC, cost would be the same.     Cavitary lesion right upper lung Bulky lymphadenopathy Hx of Small cell Carcinoma, tx at [facility] in 2007? -Right upper lung lesion measuring 4.4 cm x 4.8 cm x 4.6 cm. Complicated by history of small cell lung cancer.  Status post bronchoscopy by pulmonary on 9/12.  Pathology is pending.  There is high likelihood of recurrence of patient's small cell carcinoma.  Oncology team has been consulted. - Currently on empiric Rocephin /Flagyl  for  total of 10 days, EOT 9/21 -CT abdomen pelvis does not show evidence of metastatic disease    Left leg weakness  - PT/OT-home health   Acute stroke  - MRI confirms a 6 mm acute ischemic nonhemorrhagic infarct involving the right splenium and a 6 mm focus of enhancement involving the right pons.  Per neuro the etiology is likely small vessel disease.  LDL 182   Aortic valve stenosis Echo shows EF 65%, grade 1 DD, moderate to severe mitral valve calcification   Normocytic anemia  -hemoglobin stable, no bleeding.   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   IR to place midline today for medication and blood draw purposes.  Once we have pathology data, will likely require Chemo-Port. IR team is aware of this.   DVT prophylaxis: Heparin  drip    Code Status: Full Code Family Communication: Daughter at bedside Status is: Inpatient Remains inpatient appropriate because: Heparin  to Coumadin  bridge   PT Follow up Recs: Home Health Pt9/11/2023 1144  Subjective: Patient seen and examined at bedside.  Does not have any complaints.  I had extensive discussion with the patient and the daughter at bedside.  I have explained them clearly that patient is in hypercoagulable state likely from underlying malignancy but we are waiting on pathology results.  From medical standpoint we prefer Lovenox  to help with the anticoagulation.  Patient is agreeable to inject herself twice a day and agreeable with the cost of it.  She also understands the midline which is to be placed today is temporary and eventually we will likely need a Chemo-Port.  She also understands that we have not yet finalized her possible cancer plans until we have  tissue diagnosis.  Discussed with RN Examination:  General exam: Appears calm and comfortable  Respiratory system: Mild coarse breath sounds bilaterally Cardiovascular system: S1 & S2 heard, RRR. No JVD, murmurs, rubs, gallops or clicks. No pedal edema. Gastrointestinal system: Abdomen is  nondistended, soft and nontender. No organomegaly or masses felt. Normal bowel sounds heard. Central nervous system: Alert and oriented. No focal neurological deficits. Extremities: Symmetric 5 x 5 power. Skin: No rashes, lesions or ulcers Psychiatry: Judgement and insight appear normal. Mood & affect appropriate.                Diet Orders (From admission, onward)     Start     Ordered   10/22/23 1107  Diet Heart Room service appropriate? Yes; Fluid consistency: Thin  Diet effective now       Question Answer Comment  Room service appropriate? Yes   Fluid consistency: Thin      10/22/23 1106            Objective: Vitals:   10/26/23 0512 10/26/23 0744 10/26/23 0924 10/26/23 1206  BP: 105/69 (!) 100/57  102/74  Pulse: 96 (!) 104 (!) 106 (!) 107  Resp: 15 18  (!) 21  Temp: 98.1 F (36.7 C) 98.2 F (36.8 C)  98.7 F (37.1 C)  TempSrc: Oral Axillary  Oral  SpO2: 95% 97% 96%   Weight:      Height:        Intake/Output Summary (Last 24 hours) at 10/26/2023 1237 Last data filed at 10/26/2023 9166 Gross per 24 hour  Intake 120 ml  Output 800 ml  Net -680 ml   Filed Weights   10/19/23 1456  Weight: 98.1 kg    Scheduled Meds:  albuterol   1-2 puff Inhalation Q6H   lactulose   20 g Oral BID   liver oil-zinc  oxide   Topical Daily   polyethylene glycol  17 g Oral Daily   senna-docusate  1 tablet Oral BID   sodium chloride  flush  3 mL Intravenous Q12H   Continuous Infusions:  cefTRIAXone  (ROCEPHIN )  IV 2 g (10/25/23 1542)   heparin  1,100 Units/hr (10/26/23 0510)   metronidazole  500 mg (10/25/23 2326)    Nutritional status     Body mass index is 35.99 kg/m.  Data Reviewed:   CBC: Recent Labs  Lab 10/20/23 0246 10/21/23 0244 10/22/23 0422 10/23/23 0617 10/24/23 0150  WBC 5.1 4.8 4.1 5.3 5.8  HGB 10.0* 10.1* 9.7* 10.2* 9.7*  HCT 31.3* 32.0* 31.0* 31.7* 30.4*  MCV 86.7 86.5 88.1 86.6 87.4  PLT 160 185 206 194 194   Basic Metabolic  Panel: Recent Labs  Lab 10/20/23 0246 10/22/23 0422 10/23/23 0617 10/24/23 0150 10/25/23 1237  NA 134* 138 135 135 137  K 3.9 4.2 4.2 4.2 3.8  CL 102 102 100 100 103  CO2 23 24 25 24 24   GLUCOSE 95 96 99 107* 100*  BUN 10 11 8 10 11   CREATININE 0.67 0.71 0.77 0.75 0.78  CALCIUM  8.9 9.0 9.0 9.0 9.1  MG  --  2.0 1.8  --   --   PHOS  --   --   --   --  3.1   GFR: Estimated Creatinine Clearance: 89.9 mL/min (by C-G formula based on SCr of 0.78 mg/dL). Liver Function Tests: Recent Labs  Lab 10/22/23 0422 10/23/23 0617 10/24/23 0150  AST 17 21 20   ALT 16 16 16   ALKPHOS 57 58 59  BILITOT 0.6 0.5 0.5  PROT 7.1 7.0 7.0  ALBUMIN 3.0* 2.8* 2.9*   No results for input(s): LIPASE, AMYLASE in the last 168 hours. No results for input(s): AMMONIA in the last 168 hours. Coagulation Profile: Recent Labs  Lab 10/24/23 1209 10/25/23 1337  INR 1.1 1.2   Cardiac Enzymes: No results for input(s): CKTOTAL, CKMB, CKMBINDEX, TROPONINI in the last 168 hours. BNP (last 3 results) Recent Labs    10/18/23 1630  PROBNP 781.0*   HbA1C: No results for input(s): HGBA1C in the last 72 hours. CBG: No results for input(s): GLUCAP in the last 168 hours. Lipid Profile: No results for input(s): CHOL, HDL, LDLCALC, TRIG, CHOLHDL, LDLDIRECT in the last 72 hours. Thyroid  Function Tests: No results for input(s): TSH, T4TOTAL, FREET4, T3FREE, THYROIDAB in the last 72 hours. Anemia Panel: No results for input(s): VITAMINB12, FOLATE, FERRITIN, TIBC, IRON, RETICCTPCT in the last 72 hours. Sepsis Labs: No results for input(s): PROCALCITON, LATICACIDVEN in the last 168 hours.  Recent Results (from the past 240 hours)  MRSA Next Gen by PCR, Nasal     Status: None   Collection Time: 10/19/23  1:55 PM   Specimen: Nasal Mucosa; Nasal Swab  Result Value Ref Range Status   MRSA by PCR Next Gen NOT DETECTED NOT DETECTED Final    Comment:  (NOTE) The GeneXpert MRSA Assay (FDA approved for NASAL specimens only), is one component of a comprehensive MRSA colonization surveillance program. It is not intended to diagnose MRSA infection nor to guide or monitor treatment for MRSA infections. Test performance is not FDA approved in patients less than 61 years old. Performed at [facility], [address] [place], [place] [address]   Surgical pcr screen     Status: None   Collection Time: 10/21/23  8:25 PM   Specimen: Nasal Mucosa; Nasal Swab  Result Value Ref Range Status   MRSA, PCR NEGATIVE NEGATIVE Final   Staphylococcus aureus NEGATIVE NEGATIVE Final    Comment: (NOTE) The Xpert SA Assay (FDA approved for NASAL specimens in patients 82 years of age and older), is one component of a comprehensive surveillance program. It is not intended to diagnose infection nor to guide or monitor treatment. Performed at [facility], [address] [place], [place] [address]   Culture, BAL-quantitative w Gram Stain     Status: Abnormal   Collection Time: 10/22/23  8:48 AM   Specimen: Bronchial Alveolar Lavage; Respiratory  Result Value Ref Range Status   Specimen Description BRONCHIAL ALVEOLAR LAVAGE  Final   Special Requests NONE  Final   Gram Stain NO WBC SEEN NO ORGANISMS SEEN   Final   Culture (A)  Final    20,000 COLONIES/mL Normal respiratory flora-no Staph aureus or Pseudomonas seen Performed at [facility], [address], [place], [place] [address]    Report Status 10/25/2023 FINAL  Final  Acid Fast Smear (AFB)     Status: None   Collection Time: 10/22/23  8:48 AM   Specimen: Bronchial Alveolar Lavage; Respiratory  Result Value Ref Range Status   AFB Specimen Processing Concentration  Final   Acid Fast Smear Negative  Final    Comment: (NOTE) Performed At: [facility] [address] [place], [place] [id number] Jennette Shorter MD Ey:[phone]    Source (AFB) BRONCHIAL ALVEOLAR LAVAGE   Final    Comment: Performed at [facility], [address], [place], [place] [address]         Radiology Studies:  CT ABDOMEN PELVIS W CONTRAST Result Date: 10/25/2023 EXAM: CT ABDOMEN AND PELVIS WITH CONTRAST 10/25/2023 06:32:57 PM TECHNIQUE: CT of the abdomen and pelvis was performed with the administration of intravenous contrast. Multiplanar reformatted images are provided for review. Automated exposure control, iterative reconstruction, and/or weight-based adjustment of the mA/kV was utilized to reduce the radiation dose to as low as reasonably achievable. COMPARISON: None available. CLINICAL HISTORY: Metastatic disease evaluation. History of lung cancer. Tracking code: Bo. FINDINGS: LOWER CHEST: Visualized lung bases are clear. LIVER: The liver is unremarkable. GALLBLADDER AND BILE DUCTS: Gallbladder is unremarkable. No biliary ductal dilatation. SPLEEN: No acute abnormality. PANCREAS: No acute abnormality. ADRENAL GLANDS: No acute abnormality. KIDNEYS, URETERS AND BLADDER: No stones in the kidneys or ureters. No hydronephrosis. No perinephric or periureteral stranding. Urinary bladder is unremarkable. GI AND BOWEL: Moderate descending and sigmoid colonic diverticulosis. The stomach, small bowel, and large bowel are otherwise unremarkable. Normal appendix. PERITONEUM AND RETROPERITONEUM: No ascites. No free air. VASCULATURE: Mild aortoiliac atherosclerotic calcification. Extensive calcification of the aortic valve leaflets. Suspected bicuspid aortic valve. Calcification of the mitral valve leaflets. Global cardiac size within normal limits. Echocardiography may be helpful to better assess the aortic valve morphology and the degree of valvular dysfunction. LYMPH NODES: No lymphadenopathy. REPRODUCTIVE ORGANS: Partially calcified uterine fibroids result in a lobulated morphology of the uterus. No adnexal masses are seen. BONES AND SOFT TISSUES: Osseous structures are age appropriate. No acute  bone abnormality. No lytic or blastic bone lesion. 4.2 cm rounded subdermal lesion is seen within the left gluteal region which may represent a sebaceous cyst, but is not well characterized on this examination. Umbilical/ventral hernia repair with mesh has been performed. No recurrent abdominal wall hernia. RAF SCORE: Aortic atherosclerosis (ICD10-I70.0) IMPRESSION: 1. No evidence of metastatic disease within the abdomen and pelvis. 2. Moderate descending and sigmoid colonic diverticulosis without evidence of diverticulitis. 3. Mild aortoiliac atherosclerotic calcification. Electronically signed by: Dorethia Molt MD 10/25/2023 09:28 PM EDT RP Workstation: [id number]           LOS: 7 days   Time spent= 35 mins    Burgess JAYSON Dare, MD [facility]  If 7PM-7AM, please contact night-coverage  10/26/2023, 12:37 PM

## 2023-10-26 NOTE — Evaluation (Signed)
 Occupational Therapy Evaluation Patient Details Name: Andrea Mullen MRN: [id number] DOB: Nov 01, 1965 Today's Date: 10/26/2023   History of Present Illness   Pt is 58 yo presenting to [facility] on 9/8 due to 1 month of LLE weakness and 3 day of mild LUE weakness with facial swelling and shortness of breath. Findings of thrombus in the distal half of the R transverse sinus, R sigmoid sinus and into the R internal jugular and extending into the brachiocephalic vein and nonocclusive thrombus in the superior vena cava. MRI showing  6mm acute ischemic infart in R splenium and R pons enhancement. 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     Clinical Impressions Pt is from home where she used a rollator for mobility and family assisted with ADL as needed. Today she presents with flat affect, generalized weakness, decreased activity tolerance and cognitive deficits including problem solving, reasoning, and attention. Pt was not very conversant during session - but she did respond very positively to music [facility] [place]  is her favorite she also likes R&B and classic rock). All movements and tasks required cues for initiation. She was mod A for bed mobility, mod A for LB ADL, min to mod A for transfers with RW, able to walk to bathroom with extra time and cues for safety and max A for peri care in standing. Pt cooperative throughout session and daughter present throughout session and very supportive. Pt will benefit from continued OT acutely as well as afterwards at the [facility] level to maximize safety and independence in ADL and functional transfers. Should insurance deny HHOT OPOT would also be appropriate - but prefer HHOT to see Pt function in her home environment to maximize safety and independence.     If plan is discharge home, recommend the following:   A little help with walking and/or transfers;A lot of help with  bathing/dressing/bathroom;Assistance with cooking/housework;Assist for transportation;Help with stairs or ramp for entrance;Supervision due to cognitive status     Functional Status Assessment   Patient has had a recent decline in their functional status and demonstrates the ability to make significant improvements in function in a reasonable and predictable amount of time.     Equipment Recommendations   BSC/3in1     Recommendations for Other Services   PT consult;Speech consult     Precautions/Restrictions   Precautions Precautions: Fall Recall of Precautions/Restrictions: Impaired Precaution/Restrictions Comments: SBP <160 per neuro Restrictions Weight Bearing Restrictions Per Provider Order: No     Mobility Bed Mobility Overal bed mobility: Needs Assistance Bed Mobility: Supine to Sit     Supine to sit: Mod assist, HOB elevated, Used rails     General bed mobility comments: pt requires physical assist to initiate movement to EOB, cues for BLE, mod A for trunk elevation and use of bed pad to facilitate hips EOB    Transfers Overall transfer level: Needs assistance Equipment used: Rolling walker (2 wheels) Transfers: Sit to/from Stand, Bed to chair/wheelchair/BSC Sit to Stand: Min assist, Mod assist     Step pivot transfers: Min assist     General transfer comment: Pt utilized rocking momentum to achieve standing with min A for boost from bed. vc for safe hand placement. mod A with rocking momentum from toilet      Balance Overall balance assessment: Needs assistance Sitting-balance support: Bilateral upper extremity supported Sitting balance-Leahy Scale: Fair     Standing balance support: Bilateral upper extremity supported, During functional activity,  Reliant on assistive device for balance Standing balance-Leahy Scale: Poor Standing balance comment: requires Min A for balance.                           ADL either performed or  assessed with clinical judgement   ADL Overall ADL's : Needs assistance/impaired     Grooming: Wash/dry face;Set up;Sitting Grooming Details (indicate cue type and reason): no cues Upper Body Bathing: Moderate assistance   Lower Body Bathing: Maximal assistance   Upper Body Dressing : Moderate assistance Upper Body Dressing Details (indicate cue type and reason): gown like robe Lower Body Dressing: Moderate assistance   Toilet Transfer: Minimal assistance;Ambulation;Rolling walker (2 wheels) Toilet Transfer Details (indicate cue type and reason): increased time and frequent cues for RW placement, sequencing, no assist for physical movement or balance Toileting- Clothing Manipulation and Hygiene: Maximal assistance;Sit to/from stand Toileting - Clothing Manipulation Details (indicate cue type and reason): rear peri care done by OT while patient maintained standing     Functional mobility during ADLs: Minimal assistance;Cueing for sequencing;Cueing for safety;Rolling walker (2 wheels) General ADL Comments: decreased cognition, decreased activity tolerance     Vision         Perception         Praxis         Pertinent Vitals/Pain Pain Assessment Pain Assessment: Faces Faces Pain Scale: Hurts a little bit Pain Location: R side Pain Descriptors / Indicators: Aching, Discomfort, Grimacing Pain Intervention(s): Monitored during session, Repositioned     Extremity/Trunk Assessment Upper Extremity Assessment Upper Extremity Assessment: Generalized weakness (very long nails, MMT reveal 4-/5 strength in BUE)   Lower Extremity Assessment LLE Deficits / Details: LLE appears to be weaker than R with decreased stance time though manually tests about the same.   Cervical / Trunk Assessment Cervical / Trunk Assessment: Normal   Communication Communication Communication: Impaired Factors Affecting Communication: Difficulty expressing self   Cognition Arousal: Alert Behavior  During Therapy: Flat affect Cognition: Cognition impaired     Awareness: Intellectual awareness intact, Online awareness impaired Memory impairment (select all impairments): Working memory Attention impairment (select first level of impairment): Sustained attention Executive functioning impairment (select all impairments): Problem solving, Sequencing OT - Cognition Comments: very flat throughout sessoin. Pt requires constant cues for sequencing familiar tasks, slow to respond to questions and required assist for basic ADL - did enjoy listening to music: favorite song is [facility] [place]                  Following commands: Impaired Following commands impaired: Follows one step commands inconsistently, Follows one step commands with increased time     Cueing  General Comments   Cueing Techniques: Verbal cues;Tactile cues;Visual cues  Daughter Coral present and supportive throughout session   Exercises     Shoulder Instructions      Home Living Family/patient expects to be discharged to:: Private residence Living Arrangements: Children Available Help at Discharge: Family;Available 24 hours/day Type of Home: House Home Access: Ramped entrance     Home Layout: One level     Bathroom Shower/Tub: Chief Strategy Officer: Standard     Home Equipment: Rollator (4 wheels)          Prior Functioning/Environment Prior Level of Function : Needs assist       Physical Assist : Mobility (physical);ADLs (physical) Mobility (physical): Transfers;Gait;Bed mobility ADLs (physical): Bathing;Dressing;Toileting;IADLs Mobility Comments: pt uses Rollator at home. ADLs Comments:  Family assists with ADL's as needed.    OT Problem List: Decreased activity tolerance;Impaired balance (sitting and/or standing);Decreased cognition;Decreased safety awareness;Decreased knowledge of use of DME or AE;Cardiopulmonary status limiting activity;Obesity   OT  Treatment/Interventions: Self-care/ADL training;DME and/or AE instruction;Therapeutic activities;Cognitive remediation/compensation;Patient/family education;Balance training      OT Goals(Current goals can be found in the care plan section)   Acute Rehab OT Goals Patient Stated Goal: get better OT Goal Formulation: With patient Time For Goal Achievement: 11/09/23 Potential to Achieve Goals: Fair ADL Goals Pt Will Perform Grooming: with supervision;standing Pt Will Perform Upper Body Dressing: with modified independence;sitting Pt Will Perform Lower Body Dressing: with supervision;sit to/from stand Pt Will Transfer to Toilet: with supervision;ambulating Pt Will Perform Toileting - Clothing Manipulation and hygiene: with supervision;sit to/from stand Additional ADL Goal #1: Pt will verbalize at least 3 strategies for energy conservation during ADL with no cues   OT Frequency:  Min 2X/week    Co-evaluation              AM-PAC OT 6 Clicks Daily Activity     Outcome Measure Help from another person eating meals?: A Little Help from another person taking care of personal grooming?: A Little Help from another person toileting, which includes using toliet, bedpan, or urinal?: A Lot Help from another person bathing (including washing, rinsing, drying)?: A Lot Help from another person to put on and taking off regular upper body clothing?: A Little Help from another person to put on and taking off regular lower body clothing?: A Lot 6 Click Score: 15   End of Session Equipment Utilized During Treatment: Gait belt;Rolling walker (2 wheels) Nurse Communication: Mobility status;Other (comment) (no chair alarm or purewick)  Activity Tolerance: Patient tolerated treatment well Patient left: in chair;with call bell/phone within reach;with family/visitor present;Other (comment) (no chair alarm)  OT Visit Diagnosis: Unsteadiness on feet (R26.81);Muscle weakness (generalized)  (M62.81);Other symptoms and signs involving the nervous system (R29.898);Other symptoms and signs involving cognitive function                Time: 1026-1105 OT Time Calculation (min): 39 min Charges:  OT General Charges $OT Visit: 1 Visit OT Evaluation $OT Eval Moderate Complexity: 1 Mod OT Treatments $Self Care/Home Management : 23-37 mins  Leita DEL OTR/L Acute Rehabilitation Services Office: [phone]  Leita PARAS [facility] 10/26/2023, 12:47 PM

## 2023-10-26 NOTE — Progress Notes (Signed)
 PHARMACY - ANTICOAGULATION CONSULT NOTE  Pharmacy Consult for heparin  Indication: Dural venous thrombus  Allergies  Allergen Reactions   Bicillin C-R Hives   Penicillins Hives        Prednisone  Anaphylaxis    seizure   Nsaids Hives    Hives     Patient Measurements: Height: 5' 5 (165.1 cm) Weight: 98.1 kg (216 lb 4.3 oz) IBW/kg (Calculated) : 57 HEPARIN  DW (KG): 79.3  Vital Signs: Temp: 97.7 F (36.5 C) (09/16 1516) Temp Source: Oral (09/16 1516) BP: 94/64 (09/16 1552) Pulse Rate: 107 (09/16 1206)  Labs: Recent Labs    10/24/23 0150 10/24/23 1209 10/25/23 1237 10/25/23 1337 10/26/23 1542  HGB 9.7*  --   --   --  9.9*  HCT 30.4*  --   --   --  32.1*  PLT 194  --   --   --  196  LABPROT  --  15.3*  --  16.0*  --   INR  --  1.1  --  1.2  --   HEPARINUNFRC  --  0.39  --  0.41 0.33  CREATININE 0.75  --  0.78  --  0.80    Estimated Creatinine Clearance: 89.9 mL/min (by C-G formula based on SCr of 0.8 mg/dL).  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  She is not on anticoagulation PTA. 9/9 MRI with acute infarct, therefore targeting lower heparin  level goal.  -heparin  level 0.33 and at goal on 1100 units/hr  Goal of Therapy:  Heparin  level 0.3-0.5 units/ml Monitor platelets by anticoagulation protocol: Yes    Plan:  -Continue IV Heparin  at 1100 units/hr -Daily heparin  level, CBC and INR.  Larraine Brazier, PharmD Clinical Pharmacist 10/26/2023  4:54 PM **Pharmacist phone directory can now be found on [email or web address] ([email or web address]).  Listed under [facility].

## 2023-10-26 NOTE — Progress Notes (Signed)
 Physical Therapy Treatment Patient Details Name: Andrea Mullen MRN: [id number] DOB: October 12, 1965 Today's Date: 10/26/2023   History of Present Illness Pt is 58 yo presenting to [facility] on 9/8 due to 1 month of LLE weakness and 3 day of mild LUE weakness with facial swelling and shortness of breath. Findings of thrombus in the distal half of the R transverse sinus, R sigmoid sinus and into the R internal jugular and extending into the brachiocephalic vein and nonocclusive thrombus in the superior vena cava. MRI showing  6mm acute ischemic infart in R splenium and R pons enhancement. 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    PT Comments  Pt received in supine, agreeable to therapy session with encouragement, pt c/o fatigue. Pt needing up to modA for bed mobility with use of hospital bed features and difficulty sequencing. Up to [facility] for sit<>stand from EOB<>RW with posterior imbalance sitting/standing despite sequencing cues. SBP <100 and no dizziness reported. Pt bed wet when PTA arrived to room, RN/NT notified and family assisted to make pt's bed while she stood up and mobilized in hallway. Close chair follow for safety with pt needing seated break to rest midway through gait trial. Dense safety/step sequencing cues for gait with RW support and pt with difficulty advancing RLE due to likely LLE weakness/fatigue and difficulty maintaining proximity to RW. Pt continues to benefit from PT services to progress toward functional mobility goals, continue to recommend HHPT although if pt's family unable to provide level of physical assist needed, she would benefit from <3 hours per day post-acute therapy.     If plan is discharge home, recommend the following: A little help with walking and/or transfers;Help with stairs or ramp for entrance;Assist for transportation;Assistance with cooking/housework;Supervision due to cognitive status   Can travel  by private vehicle        Equipment Recommendations  None recommended by PT    Recommendations for Other Services       Precautions / Restrictions Precautions Precautions: Fall Recall of Precautions/Restrictions: Impaired Precaution/Restrictions Comments: SBP <160 per neuro Restrictions Weight Bearing Restrictions Per Provider Order: No     Mobility  Bed Mobility Overal bed mobility: Needs Assistance Bed Mobility: Supine to Sit     Supine to sit: Mod assist, HOB elevated, Used rails     General bed mobility comments: pt requires physical assist to initiate movement to EOB, cues for BLE, mod A for trunk elevation and use of bed pad to facilitate hips EOB. Posterior imbalance upon sitting EOB.    Transfers Overall transfer level: Needs assistance Equipment used: Rolling walker (2 wheels) Transfers: Sit to/from Stand Sit to Stand: Min assist, Mod assist           General transfer comment: Pt utilized rocking momentum to achieve standing with modA for boost from bed. minA to sit/stand from rollator seat height and for stand>sit from RW>EOB.    Ambulation/Gait Ambulation/Gait assistance: Min assist, +2 safety/equipment Gait Distance (Feet): 30 Feet (x2 with seated rest break to recover) Assistive device: Rolling walker (2 wheels) Gait Pattern/deviations: Step-to pattern, Decreased step length - right, Decreased stride length, Step-through pattern, Decreased weight shift to left, Shuffle, Decreased dorsiflexion - left, Decreased dorsiflexion - right, Drifts right/left, Trunk flexed Gait velocity: decreased     General Gait Details: pt shuffled pattern with decreased R step length and decreased L weight shift; improves significantly with constant verbal cues for big steps R/L and Min  A with wgt shifting. Dense cues for RW management, body mechanics with each step, needs assist to prevent pt bringing RW too far in advance of her hips.   Stairs              Wheelchair Mobility     Tilt Bed    Modified Rankin (Stroke Patients Only)       Balance Overall balance assessment: Needs assistance Sitting-balance support: Bilateral upper extremity supported Sitting balance-Leahy Scale: Poor Sitting balance - Comments: posterior imbalance needing variable CGA to modA, improves slightly with assist to advance hips but still leans back impulsively at times.   Standing balance support: Bilateral upper extremity supported, During functional activity, Reliant on assistive device for balance Standing balance-Leahy Scale: Poor Standing balance comment: Requires Min A for balance.                            Communication Communication Communication: Impaired Factors Affecting Communication: Difficulty expressing self  Cognition Arousal: Alert Behavior During Therapy: Flat affect   PT - Cognitive impairments: Initiation, Sequencing, Problem solving, Safety/Judgement, History of cognitive impairments                       PT - Cognition Comments: Pt requires physical assistance for initiation. Verbal cues throughout for sequencing and body mechanics. Family present and encouraging her (including granddaughter) which seems to help pt participation. Following commands: Impaired Following commands impaired: Follows one step commands inconsistently, Follows one step commands with increased time    Cueing Cueing Techniques: Verbal cues, Tactile cues, Gestural cues  Exercises      General Comments General comments (skin integrity, edema, etc.): Daughters Coral and other ([name]?) present and supportive throughout session, granddaughter also present. SBP 90's/60's, no dizziness reported with sitting/standing.      Pertinent Vitals/Pain Pain Assessment Pain Assessment: Faces Faces Pain Scale: Hurts little more Pain Location: pain where BP cuff squeezing her R arm and tenderness LE/UE to palpation Pain Descriptors /  Indicators: Aching, Discomfort, Grimacing, Guarding, Moaning Pain Intervention(s): Limited activity within patient's tolerance, Monitored during session, Repositioned    Home Living                          Prior Function            PT Goals (current goals can now be found in the care plan section) Acute Rehab PT Goals Patient Stated Goal: to improve mobility PT Goal Formulation: With patient Time For Goal Achievement: 11/03/23 Progress towards PT goals: Progressing toward goals    Frequency    Min 2X/week      PT Plan      Co-evaluation              AM-PAC PT 6 Clicks Mobility   Outcome Measure  Help needed turning from your back to your side while in a flat bed without using bedrails?: A Little Help needed moving from lying on your back to sitting on the side of a flat bed without using bedrails?: A Lot Help needed moving to and from a bed to a chair (including a wheelchair)?: A Lot Help needed standing up from a chair using your arms (e.g., wheelchair or bedside chair)?: A Lot Help needed to walk in hospital room?: A Lot Help needed climbing 3-5 steps with a railing? : Total 6 Click Score: 12    End of  Session Equipment Utilized During Treatment: Gait belt Activity Tolerance: Patient tolerated treatment well Patient left: in bed;with call bell/phone within reach;with bed alarm set;with family/visitor present Nurse Communication: Mobility status;Precautions;Other (comment) (pt needs new purewick placed; upper arm pain with BP check, PTA brought wrist size cuff to her room to see if this is less painful) PT Visit Diagnosis: Unsteadiness on feet (R26.81);Other abnormalities of gait and mobility (R26.89);Muscle weakness (generalized) (M62.81)     Time: 8364-8290 PT Time Calculation (min) (ACUTE ONLY): 34 min  Charges:    $Gait Training: 8-22 mins $Therapeutic Activity: 8-22 mins PT General Charges $$ ACUTE PT VISIT: 1 Visit                      Mailey Landstrom P., PTA Acute Rehabilitation Services Secure Chat Preferred 9a-5:30pm Office: [phone]    Connell HERO [facility] 10/26/2023, 5:30 PM

## 2023-10-26 NOTE — Telephone Encounter (Signed)
 Patient Product/process development scientist completed.    The patient is insured through [facility] MEDICAID.     Ran test claim for Eliquis  5 mg and the current 30 day co-pay is $4.00.  Ran test claim for Xarelto 20 mg and the current 30 day co-pay is $4.00.  Ran test claim for enoxaparin  (Lovenox ) 100 mg and the current 30 day co-pay is $4.00.  This test claim was processed through [facility]- copay amounts may vary at other pharmacies due to pharmacy/plan contracts, or as the patient moves through the different stages of their insurance plan.     Reyes Sharps, CPHT Pharmacy Technician III Certified Patient Advocate [facility] Patient Advocate Team Direct Number: [phone]  Fax: [phone]

## 2023-10-27 ENCOUNTER — Other Ambulatory Visit (HOSPITAL_COMMUNITY): Payer: Self-pay

## 2023-10-27 DIAGNOSIS — G08 Intracranial and intraspinal phlebitis and thrombophlebitis: Secondary | ICD-10-CM | POA: Diagnosis not present

## 2023-10-27 LAB — BASIC METABOLIC PANEL WITH GFR
Anion gap: 11 (ref 5–15)
BUN: 10 mg/dL (ref 6–20)
CO2: 24 mmol/L (ref 22–32)
Calcium: 9.3 mg/dL (ref 8.9–10.3)
Chloride: 102 mmol/L (ref 98–111)
Creatinine, Ser: 0.78 mg/dL (ref 0.44–1.00)
GFR, Estimated: >60 mL/min (ref >60)
Glucose, Bld: 100 mg/dL — ABNORMAL HIGH (ref 70–99)
Potassium: 4 mmol/L (ref 3.5–5.1)
Sodium: 137 mmol/L (ref 135–145)

## 2023-10-27 LAB — CBC
HCT: 33.9 % — ABNORMAL LOW (ref 36.0–46.0)
Hemoglobin: 10.6 g/dL — ABNORMAL LOW (ref 12.0–15.0)
MCH: 27.7 pg (ref 26.0–34.0)
MCHC: 31.3 g/dL (ref 30.0–36.0)
MCV: 88.7 fL (ref 80.0–100.0)
Platelets: 225 K/uL (ref 150–400)
RBC: 3.82 MIL/uL — ABNORMAL LOW (ref 3.87–5.11)
RDW: 15.3 % (ref 11.5–15.5)
WBC: 5.4 K/uL (ref 4.0–10.5)
nRBC: 0 % (ref 0.0–0.2)

## 2023-10-27 LAB — MAGNESIUM: Magnesium: 2.1 mg/dL (ref 1.7–2.4)

## 2023-10-27 LAB — HEPARIN LEVEL (UNFRACTIONATED): Heparin Unfractionated: 0.5 [IU]/mL (ref 0.30–0.70)

## 2023-10-27 MED ORDER — METRONIDAZOLE 500 MG PO TABS
500.0000 mg | ORAL_TABLET | Freq: Two times a day (BID) | ORAL | Status: DC
Start: 2023-10-27 — End: 2023-10-27
  Administered 2023-10-27: 500 mg via ORAL
  Filled 2023-10-27: qty 1

## 2023-10-27 MED ORDER — CYCLOBENZAPRINE HCL 5 MG PO TABS
5.0000 mg | ORAL_TABLET | Freq: Three times a day (TID) | ORAL | 0 refills | Status: AC | PRN
  Filled 2023-10-27: qty 30, 10d supply, fill #0

## 2023-10-27 MED ORDER — HYDROCODONE-ACETAMINOPHEN 5-325 MG PO TABS
1.0000 | ORAL_TABLET | Freq: Four times a day (QID) | ORAL | 0 refills | Status: DC | PRN
  Filled 2023-10-27: qty 30, 8d supply, fill #0

## 2023-10-27 MED ORDER — SENNOSIDES-DOCUSATE SODIUM 8.6-50 MG PO TABS
1.0000 | ORAL_TABLET | Freq: Two times a day (BID) | ORAL | 0 refills | Status: AC
  Filled 2023-10-27: qty 60, 30d supply, fill #0

## 2023-10-27 MED ORDER — APIXABAN 5 MG PO TABS
5.0000 mg | ORAL_TABLET | Freq: Two times a day (BID) | ORAL | 0 refills | Status: DC
  Filled 2023-10-27: qty 60, 30d supply, fill #0

## 2023-10-27 MED ORDER — METRONIDAZOLE 500 MG PO TABS
500.0000 mg | ORAL_TABLET | Freq: Two times a day (BID) | ORAL | 0 refills | Status: AC
  Filled 2023-10-27: qty 8, 4d supply, fill #0

## 2023-10-27 MED ORDER — POLYETHYLENE GLYCOL 3350 17 GM/SCOOP PO POWD
17.0000 g | Freq: Two times a day (BID) | ORAL | 0 refills | Status: AC | PRN
  Filled 2023-10-27: qty 238, 7d supply, fill #0

## 2023-10-27 MED ORDER — CEFUROXIME AXETIL 250 MG PO TABS
500.0000 mg | ORAL_TABLET | Freq: Two times a day (BID) | ORAL | Status: DC
Start: 2023-10-27 — End: 2023-10-27
  Administered 2023-10-27: 500 mg via ORAL
  Filled 2023-10-27 (×2): qty 2

## 2023-10-27 MED ORDER — APIXABAN 5 MG PO TABS
5.0000 mg | ORAL_TABLET | Freq: Two times a day (BID) | ORAL | Status: DC
  Administered 2023-10-27: 5 mg via ORAL
  Filled 2023-10-27: qty 1

## 2023-10-27 MED ORDER — CEFUROXIME AXETIL 500 MG PO TABS
500.0000 mg | ORAL_TABLET | Freq: Two times a day (BID) | ORAL | 0 refills | Status: AC
  Filled 2023-10-27: qty 8, 4d supply, fill #0

## 2023-10-27 NOTE — Plan of Care (Signed)
  Problem: Education: Goal: Knowledge of General Education information will improve Description: Including pain rating scale, medication(s)/side effects and non-pharmacologic comfort measures Outcome: Progressing   Problem: Health Behavior/Discharge Planning: Goal: Ability to manage health-related needs will improve Outcome: Progressing   Problem: Clinical Measurements: Goal: Ability to maintain clinical measurements within normal limits will improve Outcome: Progressing Goal: Will remain free from infection Outcome: Progressing Goal: Diagnostic test results will improve Outcome: Progressing Goal: Respiratory complications will improve Outcome: Progressing Goal: Cardiovascular complication will be avoided Outcome: Progressing   Problem: Activity: Goal: Risk for activity intolerance will decrease Outcome: Progressing   Problem: Nutrition: Goal: Adequate nutrition will be maintained Outcome: Progressing   Problem: Coping: Goal: Level of anxiety will decrease Outcome: Progressing   Problem: Elimination: Goal: Will not experience complications related to bowel motility Outcome: Progressing Goal: Will not experience complications related to urinary retention Outcome: Progressing   Problem: Pain Managment: Goal: General experience of comfort will improve and/or be controlled Outcome: Progressing   Problem: Safety: Goal: Ability to remain free from injury will improve Outcome: Progressing   Problem: Skin Integrity: Goal: Risk for impaired skin integrity will decrease Outcome: Progressing   Problem: Education: Goal: Knowledge of disease or condition will improve Outcome: Progressing Goal: Knowledge of secondary prevention will improve (MUST DOCUMENT ALL) Outcome: Progressing Goal: Knowledge of patient specific risk factors will improve (DELETE if not current risk factor) Outcome: Progressing   Problem: Ischemic Stroke/TIA Tissue Perfusion: Goal: Complications of  ischemic stroke/TIA will be minimized Outcome: Progressing   Problem: Coping: Goal: Will verbalize positive feelings about self Outcome: Progressing Goal: Will identify appropriate support needs Outcome: Progressing   Problem: Health Behavior/Discharge Planning: Goal: Ability to manage health-related needs will improve Outcome: Progressing Goal: Goals will be collaboratively established with patient/family Outcome: Progressing   Problem: Self-Care: Goal: Ability to participate in self-care as condition permits will improve Outcome: Progressing Goal: Verbalization of feelings and concerns over difficulty with self-care will improve Outcome: Progressing Goal: Ability to communicate needs accurately will improve Outcome: Progressing   Problem: Nutrition: Goal: Risk of aspiration will decrease Outcome: Progressing Goal: Dietary intake will improve Outcome: Progressing   Problem: Ischemic Stroke/TIA Tissue Perfusion: Goal: Complications of ischemic stroke/TIA will be minimized Outcome: Progressing   Problem: Health Behavior/Discharge Planning: Goal: Ability to manage health-related needs will improve Outcome: Progressing Goal: Goals will be collaboratively established with patient/family Outcome: Progressing   Problem: Self-Care: Goal: Ability to participate in self-care as condition permits will improve Outcome: Progressing Goal: Verbalization of feelings and concerns over difficulty with self-care will improve Outcome: Progressing Goal: Ability to communicate needs accurately will improve Outcome: Progressing   Problem: Nutrition: Goal: Risk of aspiration will decrease Outcome: Progressing Goal: Dietary intake will improve Outcome: Progressing

## 2023-10-27 NOTE — Progress Notes (Signed)
 Occupational Therapy Treatment Patient Details Name: Andrea Mullen MRN: [id number] DOB: 09-Jul-1965 Today's Date: 10/27/2023   History of present illness Pt is 58 yo presenting to [facility] on 9/8 due to 1 month of LLE weakness and 3 day of mild LUE weakness with facial swelling and shortness of breath. Findings of thrombus in the distal half of the R transverse sinus, R sigmoid sinus and into the R internal jugular and extending into the brachiocephalic vein and nonocclusive thrombus in the superior vena cava. MRI showing  6mm acute ischemic infart in R splenium and R pons enhancement. 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   OT comments  Pt progressing towards OT goals. Pt more alert today (still flat affect and delayed response/minimal verbalization) and more participatory. Responded positively again with music. Mod A for bed mobility, max A for LB dressing, mod A for UB dressing, Pt mod progressing to min A for transfers with cues for safe hand placement with RW. Educated daughter (Coral?) about body positioning during transfers and how to cue. POC updated to OPOT due to insurance - OPOT to address cognitive deficits in addition to ADL.       If plan is discharge home, recommend the following:  A little help with walking and/or transfers;A lot of help with bathing/dressing/bathroom;Assistance with cooking/housework;Assist for transportation;Help with stairs or ramp for entrance;Supervision due to cognitive status   Equipment Recommendations  BSC/3in1    Recommendations for Other Services PT consult;Speech consult    Precautions / Restrictions Precautions Precautions: Fall Recall of Precautions/Restrictions: Impaired Precaution/Restrictions Comments: SBP <160 per neuro Restrictions Weight Bearing Restrictions Per Provider Order: No       Mobility Bed Mobility Overal bed mobility: Needs Assistance Bed Mobility: Supine to  Sit     Supine to sit: Mod assist, HOB elevated, Used rails     General bed mobility comments: pt requires physical assist to initiate movement to EOB, cues for BLE, mod A for trunk elevation and use of bed pad to facilitate hips EOB. Posterior imbalance upon sitting EOB.    Transfers Overall transfer level: Needs assistance Equipment used: Rolling walker (2 wheels) Transfers: Sit to/from Stand Sit to Stand: Min assist, Mod assist     Step pivot transfers: Mod assist, Min assist     General transfer comment: Pt utilized rocking momentum to achieve standing with modA for boost from bed. minA for next transfers cues for body positioning to improve balance     Balance Overall balance assessment: Needs assistance Sitting-balance support: Bilateral upper extremity supported Sitting balance-Leahy Scale: Poor Sitting balance - Comments: posterior imbalance needing variable CGA to modA, improves slightly with assist to advance hips but still leans back impulsively at times.   Standing balance support: Bilateral upper extremity supported, During functional activity, Reliant on assistive device for balance Standing balance-Leahy Scale: Poor Standing balance comment: Requires Min to mod A for balance.                           ADL either performed or assessed with clinical judgement   ADL Overall ADL's : Needs assistance/impaired Eating/Feeding: Modified independent;Bed level Eating/Feeding Details (indicate cue type and reason): chair position in bed Grooming: Wash/dry face;Set up;Sitting           Upper Body Dressing : Moderate assistance Upper Body Dressing Details (indicate cue type and reason): gown like robe Lower Body Dressing: Moderate  assistance Lower Body Dressing Details (indicate cue type and reason): socks Toilet Transfer: Minimal assistance;Ambulation;Rolling walker (2 wheels);Cueing for safety;Cueing for sequencing   Toileting- Clothing Manipulation and  Hygiene: Maximal assistance;Sit to/from stand Toileting - Clothing Manipulation Details (indicate cue type and reason): rear peri care done by OT while patient maintained standing     Functional mobility during ADLs: Minimal assistance;Cueing for sequencing;Cueing for safety;Rolling walker (2 wheels) General ADL Comments: decreased cognition, decreased activity tolerance    Extremity/Trunk Assessment Upper Extremity Assessment Upper Extremity Assessment: Difficult to assess due to impaired cognition;Generalized weakness (very long nails, MMT)            Vision       Perception     Praxis     Communication Communication Communication: Impaired Factors Affecting Communication: Difficulty expressing self (delayed)   Cognition Arousal: Alert Behavior During Therapy: Flat affect Cognition: Cognition impaired     Awareness: Intellectual awareness intact, Online awareness impaired Memory impairment (select all impairments): Working memory Attention impairment (select first level of impairment): Sustained attention Executive functioning impairment (select all impairments): Problem solving, Sequencing OT - Cognition Comments: Pt more alert today than previous session. Pt still with delayed response and actions requiring cues for task and attention                 Following commands: Impaired Following commands impaired: Only follows one step commands consistently      Cueing   Cueing Techniques: Verbal cues, Tactile cues, Gestural cues  Exercises      Shoulder Instructions       General Comments      Pertinent Vitals/ Pain       Pain Assessment Pain Assessment: Faces Faces Pain Scale: No hurt Pain Intervention(s): Monitored during session, Repositioned  Home Living                                          Prior Functioning/Environment              Frequency  Min 2X/week        Progress Toward Goals  OT Goals(current goals  can now be found in the care plan section)  Progress towards OT goals: Progressing toward goals  Acute Rehab OT Goals Patient Stated Goal: get HOME OT Goal Formulation: With patient/family Time For Goal Achievement: 11/09/23 Potential to Achieve Goals: Fair  Plan      Co-evaluation    PT/OT/SLP Co-Evaluation/Treatment: Yes Reason for Co-Treatment: Necessary to address cognition/behavior during functional activity;To address functional/ADL transfers PT goals addressed during session: Mobility/safety with mobility;Balance;Proper use of DME;Strengthening/ROM OT goals addressed during session: ADL's and self-care;Strengthening/ROM      AM-PAC OT 6 Clicks Daily Activity     Outcome Measure   Help from another person eating meals?: A Little Help from another person taking care of personal grooming?: A Little Help from another person toileting, which includes using toliet, bedpan, or urinal?: A Lot Help from another person bathing (including washing, rinsing, drying)?: A Lot Help from another person to put on and taking off regular upper body clothing?: A Little Help from another person to put on and taking off regular lower body clothing?: A Lot 6 Click Score: 15    End of Session Equipment Utilized During Treatment: Gait belt;Rolling walker (2 wheels)  OT Visit Diagnosis: Unsteadiness on feet (R26.81);Muscle weakness (generalized) (M62.81);Other symptoms and signs involving  the nervous system (R29.898);Other symptoms and signs involving cognitive function   Activity Tolerance Patient tolerated treatment well   Patient Left in bed;with call bell/phone within reach;with family/visitor present (sitting EOB)   Nurse Communication Mobility status        Time: 8843-8771 OT Time Calculation (min): 32 min  Charges: OT General Charges $OT Visit: 1 Visit OT Treatments $Self Care/Home Management : 8-22 mins  Leita DEL OTR/L Acute Rehabilitation Services Office:  [phone]  Leita PARAS [facility] 10/27/2023, 12:45 PM

## 2023-10-27 NOTE — TOC Transition Note (Signed)
 Transition of Care [facility]) - Discharge Note   Patient Details  Name: [name] MRN: [id number] Date of Birth: [birth year]  Transition of Care [facility]) CM/SW Contact:  Roxie KANDICE Stain, RN Phone Number: 10/27/2023, 12:49 PM   Clinical Narrative:    Andrea Mullen is stable to discharge home. Follow up apt on AVS. REferral sent to [facility] out patient PT. Information added to AVS. No other  ICM (Inpatient Care Management) needs at this time.    Final next level of care: OP Rehab Barriers to Discharge: Barriers Resolved   Patient Goals and CMS Choice Patient states their goals for this hospitalization and ongoing recovery are:: return home CMS Medicare.gov Compare Post Acute Care list provided to:: Patient Choice offered to / list presented to : Patient      Discharge Placement                   home    Discharge Plan and Services Additional resources added to the After Visit Summary for     Discharge Planning Services: CM Consult            DME Arranged: Bedside commode, Walker rolling DME Agency: [employer] Date DME Agency Contacted: 10/20/23 Time DME Agency Contacted: [phone] Representative spoke with at DME Agency: [place]            Social Drivers of Health (SDOH) Interventions SDOH Screenings   Food Insecurity: No Food Insecurity (10/19/2023)  Recent Concern: Food Insecurity - Food Insecurity Present (10/05/2023)   Received from [facility]  Housing: Low Risk  (10/19/2023)  Recent Concern: Housing - High Risk (10/05/2023)   Received from [facility]  Transportation Needs: No Transportation Needs (10/19/2023)  Utilities: Not At Risk (10/19/2023)  Depression (PHQ2-9): Low Risk  (12/11/2021)  Financial Resource Strain: Medium Risk (10/05/2023)   Received from [facility]  Physical Activity: Inactive (10/05/2023)   Received from [facility]  Social Connections: Somewhat Isolated (10/05/2023)   Received from [facility]  Stress: Stress  Concern Present (10/05/2023)   Received from [facility]  Tobacco Use: High Risk (10/22/2023)     Readmission Risk Interventions    10/27/2023   12:49 PM  Readmission Risk Prevention Plan  Transportation Screening Complete  PCP or Specialist Appt within 3-5 Days Complete  HRI or Home Care Consult Complete  Social Work Consult for Recovery Care Planning/Counseling Complete  Palliative Care Screening Not Applicable  Medication Review Oceanographer) Complete

## 2023-10-27 NOTE — Progress Notes (Signed)
 PROGRESS NOTE    Andrea Mullen  FMW:[id number] DOB: 1965/06/15 DOA: 10/18/2023 PCP: [facility]    Brief Narrative:  58 year old with history of HTN, HLD, lung cancer s/p radiation/chemo, left eye blindness presented for chest pain and dyspnea.  Patient found to have head and neck thrombosis/dural sinus thrombosis and acute CVA.  Neurology and vascular surgery were consulted and patient was started on IV heparin .  Patient was also found to have right-sided cavitary lesion therefore pulmonary team was consulted.  Underwent bronchoscopy and pathology was sent.  Currently on empiric antibiotics for total of 10 days.  Oncology was consulted.  Lymph node aspiration is consistent with non-small cell lung cancer.  Heparin  will be transition to p.o. Eliquis .  Oncology to make outpatient follow-up arrangements.  Will discharge the patient today with home health arrangements.   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  CTA chest revealed cavitary mass with bulky lymphadenopathy, subsegmental PE, nonocclusive SVC thrombus, right azygous vein, right axillary vein, bilateral subclavian thrombus.  Also found to have occlusive thrombus in the right brachiocephalic vein.  CT head and neck showed subocclusive right intraventricular/dural venous sinus thrombosis.  Oncology team is following.  Currently on heparin  drip.  Will eventually transition to subcu Lovenox .  Discussed with neurology and oncology.  Patient is agreeable to subcu Lovenox .  Cost is roughly $4 a month.  If need to use DOAC, cost would be the same.     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 Non Small cell Lung Ca, new Dx.  -Right upper lung lesion measuring 4.4 cm x 4.8 cm x 4.6 cm. Complicated by history of small cell  lung cancer.  Status post bronchoscopy by pulmonary on 9/12.  Pathology from lymph node aspiration is consistent with non-small cell lung cancer.  Oncology to follow-up outpatient and make arrangements. - Currently on empiric Rocephin /Flagyl  for total of 10 days, EOT 9/21.  Transition patient to p.o. Ceftin  and Flagyl  -CT abdomen pelvis does not show evidence of metastatic disease    Left leg weakness  - PT/OT-home health   Acute stroke  - MRI confirms a 6 mm acute ischemic nonhemorrhagic infarct involving the right splenium and a 6 mm focus of enhancement involving the right pons.  Per neuro the etiology is likely small vessel disease.  LDL 182   Aortic valve stenosis Echo shows EF 65%, grade 1 DD, moderate to severe mitral valve calcification   Normocytic anemia  -hemoglobin stable, no bleeding.   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  DVT prophylaxis: Heparin  drip    Code Status: Full Code Family Communication: Daughter at bedside Status is: Inpatient Remains inpatient appropriate because:   PT Follow up Recs: Home Health Pt9/11/2023 1144  Subjective: Seen at bedside no complaints.  Wishing to go home.  All the questions have been answered by me.  Daughter present at bedside. Examination:  General exam: Appears calm and comfortable  Respiratory system: Mild coarse breath sounds bilaterally Cardiovascular system: S1 & S2 heard, RRR. No JVD, murmurs, rubs, gallops or clicks. No pedal edema. Gastrointestinal system: Abdomen is nondistended, soft and nontender. No organomegaly or masses felt. Normal bowel sounds heard. Central nervous system: Alert and oriented. No focal neurological deficits. Extremities: Symmetric 5 x 5 power. Skin:  No rashes, lesions or ulcers Psychiatry: Judgement and insight appear normal. Mood & affect appropriate.                Diet Orders (From admission, onward)     Start     Ordered    10/22/23 1107  Diet Heart Room service appropriate? Yes; Fluid consistency: Thin  Diet effective now       Question Answer Comment  Room service appropriate? Yes   Fluid consistency: Thin      10/22/23 1106            Objective: Vitals:   10/27/23 0835 10/27/23 0914 10/27/23 1119 10/27/23 1120  BP: (!) 122/104  96/72 96/72  Pulse: (!) 101 96 89 88  Resp: (!) 21 18 (!) 25 19  Temp: 97.6 F (36.4 C)  97.6 F (36.4 C)   TempSrc: Oral  Oral   SpO2: 100% 97% 99% 98%  Weight:      Height:        Intake/Output Summary (Last 24 hours) at 10/27/2023 1242 Last data filed at 10/27/2023 0900 Gross per 24 hour  Intake 623 ml  Output --  Net 623 ml   Filed Weights   10/19/23 1456  Weight: 98.1 kg    Scheduled Meds:  albuterol   1-2 puff Inhalation Q6H   apixaban   5 mg Oral BID   cefUROXime   500 mg Oral BID WC   lactulose   20 g Oral BID   lidocaine -EPINEPHrine   20 mL Intradermal Once   liver oil-zinc  oxide   Topical Daily   metroNIDAZOLE   500 mg Oral BID   polyethylene glycol  17 g Oral Daily   senna-docusate  1 tablet Oral BID   sodium chloride  flush  3 mL Intravenous Q12H   Continuous Infusions:  Nutritional status     Body mass index is 35.99 kg/m.  Data Reviewed:   CBC: Recent Labs  Lab 10/22/23 0422 10/23/23 0617 10/24/23 0150 10/26/23 1542 10/27/23 0756  WBC 4.1 5.3 5.8 6.6 5.4  HGB 9.7* 10.2* 9.7* 9.9* 10.6*  HCT 31.0* 31.7* 30.4* 32.1* 33.9*  MCV 88.1 86.6 87.4 88.9 88.7  PLT 206 194 194 196 225   Basic Metabolic Panel: Recent Labs  Lab 10/22/23 0422 10/23/23 0617 10/24/23 0150 10/25/23 1237 10/26/23 1542 10/27/23 0756  NA 138 135 135 137 137 137  K 4.2 4.2 4.2 3.8 4.1 4.0  CL 102 100 100 103 102 102  CO2 24 25 24 24 26 24   GLUCOSE 96 99 107* 100* 106* 100*  BUN 11 8 10 11 10 10   CREATININE 0.71 0.77 0.75 0.78 0.80 0.78  CALCIUM  9.0 9.0 9.0 9.1 9.0 9.3  MG 2.0 1.8  --   --   --  2.1  PHOS  --   --   --  3.1  --   --     GFR: Estimated Creatinine Clearance: 89.9 mL/min (by C-G formula based on SCr of 0.78 mg/dL). Liver Function Tests: Recent Labs  Lab 10/22/23 0422 10/23/23 0617 10/24/23 0150  AST 17 21 20   ALT 16 16 16   ALKPHOS 57 58 59  BILITOT 0.6 0.5 0.5  PROT 7.1 7.0 7.0  ALBUMIN 3.0* 2.8* 2.9*   No results for input(s): LIPASE, AMYLASE in the last 168 hours. No results for input(s): AMMONIA in the last 168 hours. Coagulation Profile: Recent Labs  Lab 10/24/23 1209 10/25/23 1337  INR 1.1 1.2   Cardiac Enzymes: No results for input(s): CKTOTAL,  CKMB, CKMBINDEX, TROPONINI in the last 168 hours. BNP (last 3 results) Recent Labs    10/18/23 1630  PROBNP 781.0*   HbA1C: No results for input(s): HGBA1C in the last 72 hours. CBG: No results for input(s): GLUCAP in the last 168 hours. Lipid Profile: No results for input(s): CHOL, HDL, LDLCALC, TRIG, CHOLHDL, LDLDIRECT in the last 72 hours. Thyroid  Function Tests: No results for input(s): TSH, T4TOTAL, FREET4, T3FREE, THYROIDAB in the last 72 hours. Anemia Panel: No results for input(s): VITAMINB12, FOLATE, FERRITIN, TIBC, IRON, RETICCTPCT in the last 72 hours. Sepsis Labs: No results for input(s): PROCALCITON, LATICACIDVEN in the last 168 hours.  Recent Results (from the past 240 hours)  MRSA Next Gen by PCR, Nasal     Status: None   Collection Time: 10/19/23  1:55 PM   Specimen: Nasal Mucosa; Nasal Swab  Result Value Ref Range Status   MRSA by PCR Next Gen NOT DETECTED NOT DETECTED Final    Comment: (NOTE) The GeneXpert MRSA Assay (FDA approved for NASAL specimens only), is one component of a comprehensive MRSA colonization surveillance program. It is not intended to diagnose MRSA infection nor to guide or monitor treatment for MRSA infections. Test performance is not FDA approved in patients less than 19 years old. Performed at [facility], [address]  [place], [place] [address]   Surgical pcr screen     Status: None   Collection Time: 10/21/23  8:25 PM   Specimen: Nasal Mucosa; Nasal Swab  Result Value Ref Range Status   MRSA, PCR NEGATIVE NEGATIVE Final   Staphylococcus aureus NEGATIVE NEGATIVE Final    Comment: (NOTE) The Xpert SA Assay (FDA approved for NASAL specimens in patients 60 years of age and older), is one component of a comprehensive surveillance program. It is not intended to diagnose infection nor to guide or monitor treatment. Performed at [facility], [address], [place], [place] [address]   Culture, BAL-quantitative w Gram Stain     Status: Abnormal   Collection Time: 10/22/23  8:48 AM   Specimen: Bronchial Alveolar Lavage; Respiratory  Result Value Ref Range Status   Specimen Description BRONCHIAL ALVEOLAR LAVAGE  Final   Special Requests NONE  Final   Gram Stain NO WBC SEEN NO ORGANISMS SEEN   Final   Culture (A)  Final    20,000 COLONIES/mL Normal respiratory flora-no Staph aureus or Pseudomonas seen Performed at [facility], [address], [place], [place] [address]    Report Status 10/25/2023 FINAL  Final  Acid Fast Smear (AFB)     Status: None   Collection Time: 10/22/23  8:48 AM   Specimen: Bronchial Alveolar Lavage; Respiratory  Result Value Ref Range Status   AFB Specimen Processing Concentration  Final   Acid Fast Smear Negative  Final    Comment: (NOTE) Performed At: [facility] [address] [place], [place] [id number] Jennette Shorter MD Ey:[id number]    Source (AFB) BRONCHIAL ALVEOLAR LAVAGE  Final    Comment: Performed at [facility], [address], [place], [place] [address]         Radiology Studies: CT ABDOMEN PELVIS W CONTRAST Result Date: 10/25/2023 EXAM: CT ABDOMEN AND PELVIS WITH CONTRAST 10/25/2023 06:32:57 PM TECHNIQUE: CT of the abdomen and pelvis was performed with the administration of intravenous contrast. Multiplanar reformatted images  are provided for review. Automated exposure control, iterative reconstruction, and/or weight-based adjustment of the mA/kV was utilized to reduce  the radiation dose to as low as reasonably achievable. COMPARISON: None available. CLINICAL HISTORY: Metastatic disease evaluation. History of lung cancer. Tracking code: Bo. FINDINGS: LOWER CHEST: Visualized lung bases are clear. LIVER: The liver is unremarkable. GALLBLADDER AND BILE DUCTS: Gallbladder is unremarkable. No biliary ductal dilatation. SPLEEN: No acute abnormality. PANCREAS: No acute abnormality. ADRENAL GLANDS: No acute abnormality. KIDNEYS, URETERS AND BLADDER: No stones in the kidneys or ureters. No hydronephrosis. No perinephric or periureteral stranding. Urinary bladder is unremarkable. GI AND BOWEL: Moderate descending and sigmoid colonic diverticulosis. The stomach, small bowel, and large bowel are otherwise unremarkable. Normal appendix. PERITONEUM AND RETROPERITONEUM: No ascites. No free air. VASCULATURE: Mild aortoiliac atherosclerotic calcification. Extensive calcification of the aortic valve leaflets. Suspected bicuspid aortic valve. Calcification of the mitral valve leaflets. Global cardiac size within normal limits. Echocardiography may be helpful to better assess the aortic valve morphology and the degree of valvular dysfunction. LYMPH NODES: No lymphadenopathy. REPRODUCTIVE ORGANS: Partially calcified uterine fibroids result in a lobulated morphology of the uterus. No adnexal masses are seen. BONES AND SOFT TISSUES: Osseous structures are age appropriate. No acute bone abnormality. No lytic or blastic bone lesion. 4.2 cm rounded subdermal lesion is seen within the left gluteal region which may represent a sebaceous cyst, but is not well characterized on this examination. Umbilical/ventral hernia repair with mesh has been performed. No recurrent abdominal wall hernia. RAF SCORE: Aortic atherosclerosis (ICD10-I70.0) IMPRESSION: 1. No evidence of  metastatic disease within the abdomen and pelvis. 2. Moderate descending and sigmoid colonic diverticulosis without evidence of diverticulitis. 3. Mild aortoiliac atherosclerotic calcification. Electronically signed by: Dorethia Molt MD 10/25/2023 09:28 PM EDT RP Workstation: [id number]           LOS: 8 days   Time spent= 35 mins    [place] Andrea Dare, MD [facility]  If 7PM-7AM, please contact night-coverage  10/27/2023, 12:42 PM

## 2023-10-27 NOTE — Plan of Care (Signed)

## 2023-10-27 NOTE — Progress Notes (Addendum)
 Physical Therapy Treatment Patient Details Name: Andrea Mullen MRN: [id number] DOB: Nov 12, 1965 Today's Date: 10/27/2023   History of Present Illness Pt is 58 yo presenting to [facility] on 9/8 due to 1 month of LLE weakness and 3 day of mild LUE weakness with facial swelling and shortness of breath. Findings of thrombus in the distal half of the R transverse sinus, R sigmoid sinus and into the R internal jugular and extending into the brachiocephalic vein and nonocclusive thrombus in the superior vena cava. MRI showing  6mm acute ischemic infart in R splenium and R pons enhancement. 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    PT Comments  Pt received in supine after eating lunch and agreeable to therapy session with encouragement, daughter present and supportive. Pt and daughter instructed on safety with transfers/bed mobility and gait with RW. Pt daughter given gait belt for fall risk prevention for home and instruction on how to don/adjust and guard with gait belt. Discussion on post-acute rehab options to try for higher intensity therapies vs OPPT as HHPT is not covered by her medicaid according to case mgmt. After discussion, pt/family agreeable to OPPT at [facility] location, discussed with daughter she would benefit from wheelchair for ease of transport to OP appointments via Access if using [facility] fleeta, family agreeable to try to obtain as she already had covered DME delivered, discussion with Donny H per pt progress.    If plan is discharge home, recommend the following: Help with stairs or ramp for entrance;Assist for transportation;Assistance with cooking/housework;Supervision due to cognitive status;A lot of help with walking and/or transfers;A lot of help with bathing/dressing/bathroom;Direct supervision/assist for medications management;Direct supervision/assist for financial management   Can travel by private vehicle         Equipment Recommendations  Wheelchair (measurements PT);Other (comment) (WC for community distances/appointments - family agreeable to try to obtain her one since RW already delivered to her room)    Recommendations for Other Services       Precautions / Restrictions Precautions Precautions: Fall Recall of Precautions/Restrictions: Impaired Precaution/Restrictions Comments: SBP <160 per neuro Restrictions Weight Bearing Restrictions Per Provider Order: No     Mobility  Bed Mobility Overal bed mobility: Needs Assistance Bed Mobility: Supine to Sit     Supine to sit: Mod assist, HOB elevated, Used rails, +2 for safety/equipment     General bed mobility comments: pt requires physical assist to initiate movement to R EOB, cues for BLE, mod A for trunk elevation and use of bed pad to facilitate hips EOB. Posterior imbalance upon sitting EOB requiring therapist behind bed to assist to maintain balance x 3 episodes    Transfers Overall transfer level: Needs assistance Equipment used: Rolling walker (2 wheels) Transfers: Sit to/from Stand Sit to Stand: Min assist, Mod assist, +2 safety/equipment           General transfer comment: Pt unable to stand from EOB with CGA when pt requesting to attempt on her own. Pt then utilized rocking momentum to achieve standing with modA for boost from bed and frequent reminders to push from bed rather than pulling up on RW handles. minA for next transfers with cues for body positioning to improve balance.    Ambulation/Gait Ambulation/Gait assistance: Mod assist, Min assist, +2 safety/equipment Gait Distance (Feet): 55 Feet Assistive device: Rolling walker (2 wheels) Gait Pattern/deviations: Step-to pattern, Decreased step length - right, Decreased stride length, Step-through pattern, Decreased weight shift to  left, Shuffle, Decreased dorsiflexion - left, Decreased dorsiflexion - right, Drifts right/left, Trunk flexed       General Gait  Details: Baseline shuffled pattern with decreased R step length and decreased L weight shift; improves significantly with constant verbal cues for big steps R/L and Min A with wgt shifting. Dense cues for RW management, body mechanics with each step, needs assist to prevent pt bringing RW too far in advance of her hips, especially as she fatigues. Up to [facility] for stability/steadying when turning. Chair follow for safety. HR 110-120 bpm with exertion, SpO2 WFL on RA.   Stairs Stairs:  (no STE home)           Wheelchair Mobility     Tilt Bed    Modified Rankin (Stroke Patients Only) Modified Rankin (Stroke Patients Only) Pre-Morbid Rankin Score: Slight disability Modified Rankin: Moderately severe disability     Balance Overall balance assessment: Needs assistance Sitting-balance support: Bilateral upper extremity supported Sitting balance-Leahy Scale: Poor Sitting balance - Comments: posterior imbalance needing variable CGA to modA, improves slightly with assist to advance hips but still leans back impulsively at times.   Standing balance support: Bilateral upper extremity supported, During functional activity, Reliant on assistive device for balance Standing balance-Leahy Scale: Poor Standing balance comment: Requires Min to mod A for balance.                            Communication Communication Communication: Impaired Factors Affecting Communication: Difficulty expressing self (delayed)  Cognition Arousal: Alert Behavior During Therapy: Flat affect   PT - Cognitive impairments: History of cognitive impairments, Initiation, Sequencing, Problem solving, Safety/Judgement                       PT - Cognition Comments: Pt requires physical assistance for initiation. Verbal cues throughout for sequencing and body mechanics. Family present and encouraging her, and playing music seems to help motivate her. Following commands: Impaired Following commands  impaired: Only follows one step commands consistently, Follows one step commands with increased time    Cueing Cueing Techniques: Verbal cues, Tactile cues, Gestural cues  Exercises      General Comments General comments (skin integrity, edema, etc.): BP stable supine and seated, HR to mid-120's bpm with exertion, SpO2 WFL on RA. purewick removed as it was soiled and pt totalA for posterior hygiene assist standing initially before ambulating. Pt requesting to sit EOB to eat her dessert post-exertion, daughter in room and agreeable to monitor pt for safety, bed alarm also on for pt safety.      Pertinent Vitals/Pain Pain Assessment Pain Assessment: No/denies pain Faces Pain Scale: No hurt Pain Intervention(s): Monitored during session, Repositioned    Home Living                          Prior Function            PT Goals (current goals can now be found in the care plan section) Acute Rehab PT Goals Patient Stated Goal: to improve mobility PT Goal Formulation: With patient Time For Goal Achievement: 11/03/23 Progress towards PT goals: Progressing toward goals    Frequency    Min 2X/week      PT Plan      Co-evaluation PT/OT/SLP Co-Evaluation/Treatment: Yes Reason for Co-Treatment: Necessary to address cognition/behavior during functional activity;To address functional/ADL transfers PT goals addressed during session: Mobility/safety with  mobility;Balance;Proper use of DME;Strengthening/ROM OT goals addressed during session: ADL's and self-care;Strengthening/ROM      AM-PAC PT 6 Clicks Mobility   Outcome Measure  Help needed turning from your back to your side while in a flat bed without using bedrails?: A Little Help needed moving from lying on your back to sitting on the side of a flat bed without using bedrails?: A Lot Help needed moving to and from a bed to a chair (including a wheelchair)?: A Lot Help needed standing up from a chair using your arms  (e.g., wheelchair or bedside chair)?: A Lot Help needed to walk in hospital room?: A Lot Help needed climbing 3-5 steps with a railing? : Total 6 Click Score: 12    End of Session Equipment Utilized During Treatment: Gait belt Activity Tolerance: Patient tolerated treatment well Patient left: in bed;with call bell/phone within reach;with bed alarm set;with family/visitor present;Other (comment) (sitting EOB to eat dessert) Nurse Communication: Mobility status;Other (comment) (pt/family agreeable to OPPT at [facility] location) PT Visit Diagnosis: Unsteadiness on feet (R26.81);Other abnormalities of gait and mobility (R26.89);Muscle weakness (generalized) (M62.81)     Time: 8843-8770 PT Time Calculation (min) (ACUTE ONLY): 33 min  Charges:    $Gait Training: 8-22 mins PT General Charges $$ ACUTE PT VISIT: 1 Visit                     Marykate Heuberger P., PTA Acute Rehabilitation Services Secure Chat Preferred 9a-5:30pm Office: [phone]    Connell HERO [facility] 10/27/2023, 2:29 PM

## 2023-10-27 NOTE — Discharge Summary (Signed)
 Physician Discharge Summary  Andrea Mullen FMW:[id number] DOB: [birth year] DOA: 10/18/2023  PCP: [facility]  Admit date: 10/18/2023 Discharge date: 10/27/2023  Admitted From: Home Disposition: Home  Recommendations for Outpatient Follow-up:  Follow up with PCP in 1-2 weeks Please obtain BMP/CBC in one week your next doctors visit.  Transition to p.o. Eliquis  Pain medications were bowel regimen has been prescribed P.o. Ceftin  and Flagyl  until 9/21 Follow-up outpatient oncology at [facility].  To be arranged by their service Patient aware that she may require port placement   Discharge Condition: Stable CODE STATUS: Full code Diet recommendation: Low-salt  Brief/Interim Summary: Brief Narrative:  58 year old with history of HTN, HLD, lung cancer s/p radiation/chemo, left eye blindness presented for chest pain and dyspnea.  Patient found to have head and neck thrombosis/dural sinus thrombosis and acute CVA.  Neurology and vascular surgery were consulted and patient was started on IV heparin .  Patient was also found to have right-sided cavitary lesion therefore pulmonary team was consulted.  Underwent bronchoscopy and pathology was sent.  Currently on empiric antibiotics for total of 10 days.  Oncology was consulted.  Lymph node aspiration is consistent with non-small cell lung cancer.  Heparin  will be transition to p.o. Eliquis .  Oncology to make outpatient follow-up arrangements.  Will discharge the patient today with home health arrangements.   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  CTA chest revealed cavitary mass with bulky lymphadenopathy, subsegmental PE, nonocclusive SVC thrombus, right azygous vein, right axillary vein, bilateral subclavian thrombus.  Also found to have occlusive thrombus in the right  brachiocephalic vein.  CT head and neck showed subocclusive right intraventricular/dural venous sinus thrombosis.  Oncology team is following.  Currently on heparin  drip.  Will eventually transition to subcu Lovenox .  Discussed with neurology and oncology.  Patient is agreeable to subcu Lovenox .  Cost is roughly $4 a month.  If need to use DOAC, cost would be the same.     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 Non Small cell Lung Ca, new Dx.  -Right upper lung lesion measuring 4.4 cm x 4.8 cm x 4.6 cm. Complicated by history of small cell lung cancer.  Status post bronchoscopy by pulmonary on 9/12.  Pathology from lymph node aspiration is consistent with non-small cell lung cancer.  Oncology to follow-up outpatient and make arrangements. - Currently on empiric Rocephin /Flagyl  for total of 10 days, EOT 9/21.  Transition patient to p.o. Ceftin  and Flagyl  -CT abdomen pelvis does not show evidence of metastatic disease    Left leg weakness  - PT/OT-home health   Acute stroke  - MRI confirms a 6 mm acute ischemic nonhemorrhagic infarct involving the right splenium and a 6 mm focus of enhancement involving the right pons.  Per neuro the etiology is likely small vessel disease.  LDL 182   Aortic valve stenosis Echo shows EF 65%, grade 1 DD, moderate to severe mitral valve calcification   Normocytic anemia  -hemoglobin stable, no bleeding.   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  DVT prophylaxis: Heparin  drip    Code Status: Full Code Family Communication: Daughter at bedside Status is: Inpatient Remains inpatient appropriate because:   PT Follow up Recs: Home Health Pt9/11/2023 1144  Subjective: Seen at  bedside no complaints.  Wishing to go home.  All the questions have been answered by me.  Daughter present at bedside. Examination:  General exam: Appears calm and  comfortable  Respiratory system: Mild coarse breath sounds bilaterally Cardiovascular system: S1 & S2 heard, RRR. No JVD, murmurs, rubs, gallops or clicks. No pedal edema. Gastrointestinal system: Abdomen is nondistended, soft and nontender. No organomegaly or masses felt. Normal bowel sounds heard. Central nervous system: Alert and oriented. No focal neurological deficits. Extremities: Symmetric 5 x 5 power. Skin: No rashes, lesions or ulcers Psychiatry: Judgement and insight appear normal. Mood & affect appropriate.    Discharge Diagnoses:  Principal Problem:   Dural venous sinus thrombosis Active Problems:   Acute respiratory distress   Superior vena cava thrombosis (HCC)   Brachiocephalic vein thrombosis (HCC)   Acute thrombosis of right axillary vein (HCC)   Subclavian vein thrombosis (HCC)   Cavitating mass in right upper lung lobe   History of lung cancer   Left leg weakness   Mediastinal lymphadenopathy   Cavitary lesion of lung   Acute embolism and thrombosis of right subclavian vein (HCC)      Discharge Exam: Vitals:   10/27/23 1119 10/27/23 1120  BP: 96/72 96/72  Pulse: 89 88  Resp: (!) 25 19  Temp: 97.6 F (36.4 C)   SpO2: 99% 98%   Vitals:   10/27/23 0835 10/27/23 0914 10/27/23 1119 10/27/23 1120  BP: (!) 122/104  96/72 96/72  Pulse: (!) 101 96 89 88  Resp: (!) 21 18 (!) 25 19  Temp: 97.6 F (36.4 C)  97.6 F (36.4 C)   TempSrc: Oral  Oral   SpO2: 100% 97% 99% 98%  Weight:      Height:          Discharge Instructions  Discharge Instructions     Ambulatory referral to Physical Therapy   Complete by: As directed       Allergies as of 10/27/2023       Reactions   Bicillin C-r Hives   Penicillins Hives      Prednisone  Anaphylaxis   seizure   Nsaids Hives   Hives        Medication List     TAKE these medications    albuterol  108 (90 Base) MCG/ACT inhaler Commonly known as: VENTOLIN  HFA Inhale 2 puffs into the lungs every 4  (four) hours as needed for wheezing or shortness of breath.   albuterol  (2.5 MG/3ML) 0.083% nebulizer solution Commonly known as: PROVENTIL  Take 3 mLs (2.5 mg total) by nebulization every 6 (six) hours as needed for wheezing or shortness of breath.   apixaban  5 MG Tabs tablet Commonly known as: ELIQUIS  Take 1 tablet (5 mg total) by mouth 2 (two) times daily.   butalbital -acetaminophen -caffeine  50-325-40 MG tablet Commonly known as: FIORICET Take 1-2 tablets by mouth every 6 (six) hours as needed for headache.   cefUROXime  500 MG tablet Commonly known as: CEFTIN  Take 1 tablet (500 mg total) by mouth 2 (two) times daily with a meal for 4 days.   cyclobenzaprine  5 MG tablet Commonly known as: FLEXERIL  Take 1 tablet (5 mg total) by mouth 3 (three) times daily as needed for muscle spasms.   HYDROcodone -acetaminophen  5-325 MG tablet Commonly known as: NORCO/VICODIN Take 1 tablet by mouth every 6 (six) hours as needed for moderate pain (pain score 4-6).   metroNIDAZOLE  500 MG tablet Commonly known as: FLAGYL  Take 1 tablet (500 mg total) by mouth 2 (  two) times daily for 4 days.   polyethylene glycol 17 g packet Commonly known as: MIRALAX  / GLYCOLAX  Take 17 g by mouth 2 (two) times daily as needed for mild constipation or moderate constipation.   rosuvastatin  40 MG tablet Commonly known as: CRESTOR  Take 1 tablet (40 mg total) by mouth daily.   senna-docusate 8.6-50 MG tablet Commonly known as: Senokot-S Take 1 tablet by mouth 2 (two) times daily.   Vitamin D (Ergocalciferol) 1.25 MG (50000 UNIT) Caps capsule Commonly known as: DRISDOL Take 50,000 Units by mouth once a week.               Durable Medical Equipment  (From admission, onward)           Start     Ordered   10/20/23 1504  For home use only DME Walker rolling  Once       Question Answer Comment  Walker: With 5 Inch Wheels   Patient needs a walker to treat with the following condition Weakness       10/20/23 1504   10/20/23 1504  For home use only DME Bedside commode  Once       Question:  Patient needs a bedside commode to treat with the following condition  Answer:  Weakness   10/20/23 1504            Follow-up Information     [facility] at [facility]. Schedule an appointment as soon as possible for a visit.   Specialty: Rehabilitation Why: Call to schedule apt for physical thearpy Contact information: [address] [place]  [address] [phone]        [facility] Follow up in 1 week(s).   Contact information: [address] [place] [address] [phone]                Allergies  Allergen Reactions   Bicillin C-R Hives   Penicillins Hives        Prednisone  Anaphylaxis    seizure   Nsaids Hives    Hives     You were cared for by a hospitalist during your hospital stay. If you have any questions about your discharge medications or the care you received while you were in the hospital after you are discharged, you can call the unit and asked to speak with the hospitalist on call if the hospitalist that took care of you is not available. Once you are discharged, your primary care physician will handle any further medical issues. Please note that no refills for any discharge medications will be authorized once you are discharged, as it is imperative that you return to your primary care physician (or establish a relationship with a primary care physician if you do not have one) for your aftercare needs so that they can reassess your need for medications and monitor your lab values.  You were cared for by a hospitalist during your hospital stay. If you have any questions about your discharge medications or the care you received while you were in the hospital after you are discharged, you can call the unit and asked to speak with the hospitalist on call if the hospitalist that  took care of you is not available. Once you are discharged, your primary care physician will handle any further medical issues. Please note that NO REFILLS for any discharge medications will be authorized once you are discharged, as it is imperative that you return to your primary care physician (  or establish a relationship with a primary care physician if you do not have one) for your aftercare needs so that they can reassess your need for medications and monitor your lab values.  Please request your Prim.MD to go over all Hospital Tests and Procedure/Radiological results at the follow up, please get all Hospital records sent to your Prim MD by signing hospital release before you go home.  Get CBC, CMP, 2 view Chest X ray checked  by Primary MD during your next visit or SNF MD in 5-7 days ( we routinely change or add medications that can affect your baseline labs and fluid status, therefore we recommend that you get the mentioned basic workup next visit with your PCP, your PCP may decide not to get them or add new tests based on their clinical decision)  On your next visit with your primary care physician please Get Medicines reviewed and adjusted.  If you experience worsening of your admission symptoms, develop shortness of breath, life threatening emergency, suicidal or homicidal thoughts you must seek medical attention immediately by calling 911 or calling your MD immediately  if symptoms less severe.  You Must read complete instructions/literature along with all the possible adverse reactions/side effects for all the Medicines you take and that have been prescribed to you. Take any new Medicines after you have completely understood and accpet all the possible adverse reactions/side effects.   Do not drive, operate heavy machinery, perform activities at heights, swimming or participation in water activities or provide baby sitting services if your were admitted for syncope or siezures until you have  seen by Primary MD or a Neurologist and advised to do so again.  Do not drive when taking Pain medications.   Procedures/Studies: CT ABDOMEN PELVIS W CONTRAST Result Date: 10/25/2023 EXAM: CT ABDOMEN AND PELVIS WITH CONTRAST 10/25/2023 06:32:57 PM TECHNIQUE: CT of the abdomen and pelvis was performed with the administration of intravenous contrast. Multiplanar reformatted images are provided for review. Automated exposure control, iterative reconstruction, and/or weight-based adjustment of the mA/kV was utilized to reduce the radiation dose to as low as reasonably achievable. COMPARISON: None available. CLINICAL HISTORY: Metastatic disease evaluation. History of lung cancer. Tracking code: Bo. FINDINGS: LOWER CHEST: Visualized lung bases are clear. LIVER: The liver is unremarkable. GALLBLADDER AND BILE DUCTS: Gallbladder is unremarkable. No biliary ductal dilatation. SPLEEN: No acute abnormality. PANCREAS: No acute abnormality. ADRENAL GLANDS: No acute abnormality. KIDNEYS, URETERS AND BLADDER: No stones in the kidneys or ureters. No hydronephrosis. No perinephric or periureteral stranding. Urinary bladder is unremarkable. GI AND BOWEL: Moderate descending and sigmoid colonic diverticulosis. The stomach, small bowel, and large bowel are otherwise unremarkable. Normal appendix. PERITONEUM AND RETROPERITONEUM: No ascites. No free air. VASCULATURE: Mild aortoiliac atherosclerotic calcification. Extensive calcification of the aortic valve leaflets. Suspected bicuspid aortic valve. Calcification of the mitral valve leaflets. Global cardiac size within normal limits. Echocardiography may be helpful to better assess the aortic valve morphology and the degree of valvular dysfunction. LYMPH NODES: No lymphadenopathy. REPRODUCTIVE ORGANS: Partially calcified uterine fibroids result in a lobulated morphology of the uterus. No adnexal masses are seen. BONES AND SOFT TISSUES: Osseous structures are age appropriate. No  acute bone abnormality. No lytic or blastic bone lesion. 4.2 cm rounded subdermal lesion is seen within the left gluteal region which may represent a sebaceous cyst, but is not well characterized on this examination. Umbilical/ventral hernia repair with mesh has been performed. No recurrent abdominal wall hernia. RAF SCORE: Aortic atherosclerosis (ICD10-I70.0) IMPRESSION: 1.  No evidence of metastatic disease within the abdomen and pelvis. 2. Moderate descending and sigmoid colonic diverticulosis without evidence of diverticulitis. 3. Mild aortoiliac atherosclerotic calcification. Electronically signed by: Dorethia Molt MD 10/25/2023 09:28 PM EDT RP Workstation: [id number]   DG CHEST PORT 1 VIEW Result Date: 10/24/2023 CLINICAL DATA:  Shortness of breath. EXAM: PORTABLE CHEST 1 VIEW COMPARISON:  Chest radiograph dated 10/22/2023. FINDINGS: Cavitary mass in the right upper lobe measures 5.2 cm in diameter. No new consolidation. There is no pleural effusion pneumothorax. The cardiac silhouette is within normal limits. No acute osseous pathology. IMPRESSION: Cavitary mass in the right upper lobe. Electronically Signed   By: Vanetta Chou M.D.   On: 10/24/2023 13:50   DG CHEST PORT 1 VIEW Result Date: 10/22/2023 CLINICAL DATA:  Status post bronchoscopy EXAM: PORTABLE CHEST 1 VIEW COMPARISON:  October 18, 2023 FINDINGS: Redemonstration of cavitary right upper lobe lung mass. No new consolidations. No pleural effusions. No pneumothorax. Unchanged cardiomediastinal silhouette with known right paratracheal lymphadenopathy. No acute osseous findings. IMPRESSION: Unchanged right upper lobe cavitary lung mass and right paratracheal lymphadenopathy. No acute findings. Electronically Signed   By: Michaeline Blanch M.D.   On: 10/22/2023 11:58   ECHOCARDIOGRAM COMPLETE Result Date: 10/20/2023    ECHOCARDIOGRAM REPORT   Patient Name:   Andrea Mullen Sawin Date of Exam: 10/20/2023 Medical Rec #:  [id number]        Height:        65.0 in Accession #:    [id number]       Weight:       216.3 lb Date of Birth:  November 22, 1965        BSA:          2.045 m Patient Age:    57 years         BP:           104/73 mmHg Patient Gender: F                HR:           105 bpm. Exam Location:  Inpatient Procedure: 2D Echo, Cardiac Doppler and Color Doppler (Both Spectral and Color            Flow Doppler were utilized during procedure). Indications:    Pulmonary Embolus  History:        Patient has prior history of Echocardiogram examinations, most                 recent 11/09/2021. CAD, Stroke, Signs/Symptoms:Chest Pain; Risk                 Factors:Hypertension and Current Smoker.  Sonographer:    Juliene Rucks Referring Phys: [phone] RONDELL A SMITH IMPRESSIONS  1. Left ventricular ejection fraction, by estimation, is 60 to 65%. The left ventricle has normal function. The left ventricle has no regional wall motion abnormalities. Left ventricular diastolic parameters are consistent with Grade I diastolic dysfunction (impaired relaxation). Elevated left ventricular end-diastolic pressure.  2. Right ventricular systolic function was not well visualized. The right ventricular size is not well visualized.  3. The mitral valve is degenerative. Trivial mitral valve regurgitation. Moderate to severe mitral annular calcification.  4. The aortic valve was not adequately interrogated to determine stenosis - there is calcification of the valve and aortic root. Image 67 demonstrates an aortic valve gradient of at least 30 mmHg peak and 14 mmHg, mean.. The aortic valve has an indeterminant number of cusps. There is severe  calcifcation of the aortic valve. Aortic valve regurgitation is trivial. Moderate to severe aortic valve stenosis.  5. The inferior vena cava is normal in size with greater than 50% respiratory variability, suggesting right atrial pressure of 3 mmHg. Comparison(s): Changes from prior study are noted. 11/09/2021: LVEF 60-65%, severe AS - possibly bicuspid  aortic valve. Conclusion(s)/Recommendation(s): Consider further evaluation of the aortic valve with Mullen. FINDINGS  Left Ventricle: Left ventricular ejection fraction, by estimation, is 60 to 65%. The left ventricle has normal function. The left ventricle has no regional wall motion abnormalities. The left ventricular internal cavity size was normal in size. There is  no left ventricular hypertrophy. Left ventricular diastolic parameters are consistent with Grade I diastolic dysfunction (impaired relaxation). Elevated left ventricular end-diastolic pressure. Right Ventricle: The right ventricular size is not well visualized. Right vetricular wall thickness was not well visualized. Right ventricular systolic function was not well visualized. Left Atrium: Left atrial size was normal in size. Right Atrium: Right atrial size was normal in size. Pericardium: There is no evidence of pericardial effusion. Mitral Valve: The mitral valve is degenerative in appearance. Moderate to severe mitral annular calcification. Trivial mitral valve regurgitation. Tricuspid Valve: The tricuspid valve is not well visualized. Tricuspid valve regurgitation is not demonstrated. Aortic Valve: The aortic valve was not adequately interrogated to determine stenosis - there is calcification of the valve and aortic root. Image 67 demonstrates an aortic valve gradient of at least 30 mmHg peak and 14 mmHg, mean. The aortic valve has an  indeterminant number of cusps. There is severe calcifcation of the aortic valve. There is moderate aortic valve annular calcification. Aortic valve regurgitation is trivial. Moderate to severe aortic stenosis is present. Pulmonic Valve: The pulmonic valve was not well visualized. Pulmonic valve regurgitation is not visualized. Aorta: The aortic root and ascending aorta are structurally normal, with no evidence of dilitation. Venous: The inferior vena cava is normal in size with greater than 50% respiratory  variability, suggesting right atrial pressure of 3 mmHg. IAS/Shunts: The interatrial septum was not well visualized.  LEFT VENTRICLE PLAX 2D LVIDd:         5.20 cm   Diastology LVIDs:         4.60 cm   LV e' medial:    4.24 cm/s LV PW:         0.80 cm   LV E/e' medial:  30.2 LV IVS:        0.90 cm   LV e' lateral:   7.07 cm/s LVOT diam:     1.70 cm   LV E/e' lateral: 18.1 LVOT Area:     2.27 cm  RIGHT VENTRICLE RV S prime:     5.34 cm/s LEFT ATRIUM           Index LA diam:      3.00 cm 1.47 cm/m LA Vol (A2C): 48.1 ml 23.52 ml/m LA Vol (A4C): 40.9 ml 20.00 ml/m   AORTA Ao Root diam: 2.60 cm MITRAL VALVE MV Area (PHT): 4.39 cm     SHUNTS MV Decel Time: 173 msec     Systemic Diam: 1.70 cm MV E velocity: 128.00 cm/s MV A velocity: 162.00 cm/s MV E/A ratio:  0.79 Vinie Maxcy MD Electronically signed by Vinie Maxcy MD Signature Date/Time: 10/20/2023/6:31:01 PM    Final    VAS US  LOWER EXTREMITY VENOUS (DVT) Result Date: 10/20/2023  Lower Venous DVT Study Patient Name:  Andrea Mullen  Date of Exam:   10/20/2023  Medical Rec #: [id number]         Accession #:    [id number] Date of Birth: 1965/10/05         Patient Gender: F Patient Age:   6 years Exam Location:  [facility] Procedure:      VAS US  LOWER EXTREMITY VENOUS (DVT) Referring Phys: MAXIMINO SHARPS --------------------------------------------------------------------------------  Indications: Pulmonary embolism.  Performing Technologist: Elmarie Lindau, RVT  Examination Guidelines: A complete evaluation includes B-mode imaging, spectral Doppler, color Doppler, and power Doppler as needed of all accessible portions of each vessel. Bilateral testing is considered an integral part of a complete examination. Limited examinations for reoccurring indications may be performed as noted. The reflux portion of the exam is performed with the patient in reverse Trendelenburg.  +---------+---------------+---------+-----------+----------+--------------+ RIGHT     CompressibilityPhasicitySpontaneityPropertiesThrombus Aging +---------+---------------+---------+-----------+----------+--------------+ CFV      Full           Yes      Yes                                 +---------+---------------+---------+-----------+----------+--------------+ SFJ      Full                                                        +---------+---------------+---------+-----------+----------+--------------+ FV Prox  Full                                                        +---------+---------------+---------+-----------+----------+--------------+ FV Mid   Full                                                        +---------+---------------+---------+-----------+----------+--------------+ FV DistalFull                                                        +---------+---------------+---------+-----------+----------+--------------+ PFV      Full                                                        +---------+---------------+---------+-----------+----------+--------------+ POP      Partial                                      Chronic        +---------+---------------+---------+-----------+----------+--------------+ PTV      Full                                                        +---------+---------------+---------+-----------+----------+--------------+  PERO     Full                                                        +---------+---------------+---------+-----------+----------+--------------+   +---------+---------------+---------+-----------+----------+--------------+ LEFT     CompressibilityPhasicitySpontaneityPropertiesThrombus Aging +---------+---------------+---------+-----------+----------+--------------+ CFV      Full           Yes      Yes                                 +---------+---------------+---------+-----------+----------+--------------+ SFJ      Full                                                         +---------+---------------+---------+-----------+----------+--------------+ FV Prox  Full                                                        +---------+---------------+---------+-----------+----------+--------------+ FV Mid   Full                                                        +---------+---------------+---------+-----------+----------+--------------+ FV DistalFull                                                        +---------+---------------+---------+-----------+----------+--------------+ PFV      Full                                                        +---------+---------------+---------+-----------+----------+--------------+ POP      Partial                                      Chronic        +---------+---------------+---------+-----------+----------+--------------+ PTV      Full                                                        +---------+---------------+---------+-----------+----------+--------------+ PERO     Full                                                        +---------+---------------+---------+-----------+----------+--------------+  Summary: RIGHT: - Findings consistent 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  - No cystic structure found in the popliteal fossa.  LEFT: - Findings consistent with chronic deep vein thrombosis involving the left popliteal vein.  - No cystic structure found in the popliteal fossa.  *See table(s) above for measurements and observations. Electronically signed by Lonni Gaskins MD on 10/20/2023 at 2:53:16 PM.    Final    MR Brain W and Wo Contrast Result Date: 10/19/2023 CLINICAL DATA:  Initial evaluation for dural venous sinus thrombosis, acute neuro deficit. EXAM: MRI HEAD WITHOUT AND WITH CONTRAST TECHNIQUE: Multiplanar, multiecho pulse sequences of the brain and surrounding structures were obtained without and with intravenous contrast.  CONTRAST:  10mL GADAVIST  GADOBUTROL  1 MMOL/ML IV SOLN COMPARISON:  Comparison made with CT from earlier the same day as well as multiple previous exams. FINDINGS: Brain: Diffuse prominence of the CSF containing spaces compatible generalized cerebral atrophy. Patchy and confluent T2/FLAIR hyperintensity involving the periventricular deep white matter both cerebral hemispheres, consistent with chronic microvascular ischemic disease, moderately advanced in nature. Mild patchy involvement of the pons and cerebellum noted. Few remote lacunar infarcts noted about the bilateral basal ganglia. Remote right pontine infarct with a few scattered small remote bilateral cerebellar infarcts. 6 mm acute ischemic nonhemorrhagic infarcts seen involving the right splenium (series 5, image 75). No other evidence for acute or subacute ischemia. Gray-white matter differentiation otherwise maintained. No acute intracranial hemorrhage. Multiple scattered chronic micro hemorrhages noted, likely hypertensive in nature. No mass lesion, midline shift, or mass effect. Mild ventricular prominence related to global parenchymal volume loss of hydrocephalus. No extra-axial fluid collection. Pituitary gland and suprasellar region within normal limits. There is an apparent 6 mm focus of enhancement involving the right pons (series 17, image 14). This is at the location of an underlying right pontine infarct as well as a few prominent chronic micro hemorrhages. Given this, finding is suspected to reflect enhancement related to a small of all vein subacute infarct or possibly artifact related to the adjacent microhemorrhage. No other abnormal enhancement elsewhere within the brain. Vascular: Major intracranial arterial vascular flow voids are maintained. Previously identified dural venous sinus thrombosis involving the right transverse and sigmoid sinuses with extension into the right jugular bulb and visualized proximal right internal jugular vein,  corresponding with findings on recent exams. Overall, appearance is relatively similar. Skull and upper cervical spine: Craniocervical junction within normal limits. Heterogeneous and mildly decreased T1 signal intensity within the visualized bone marrow, nonspecific, but most commonly related to anemia, smoking or obesity. No scalp soft tissue abnormality. Sinuses/Orbits: Globes orbital soft tissues within normal limits. Mild chronic mucosal thickening noted about the paranasal sinuses. Moderate to large right mastoid and middle ear effusion. Trace left mastoid effusion noted. Other: None. IMPRESSION: 1. 6 mm acute ischemic nonhemorrhagic infarct involving the right splenium. 2. 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 Overall, appearance is relatively stable from prior. 3. 6 mm focus of enhancement involving the right pons, favored to reflect enhancement related to a small subacute infarct or possibly artifact related to the adjacent microhemorrhage. Short interval follow-up MRI in 3 months suggested to ensure resolution and/or stability. 4. Underlying age-related cerebral atrophy with moderately advanced chronic microvascular ischemic disease, with a few scattered remote lacunar infarcts involving the bilateral basal ganglia, right pons, and bilateral cerebellar hemispheres. 5. Multiple scattered  chronic micro hemorrhages, likely hypertensive in nature. Electronically Signed   By: Morene Hoard M.D.   On: 10/19/2023 23:23   CT VENOGRAM HEAD Result Date: 10/19/2023 CLINICAL DATA:  58 year old female status post fall. Pain. Prior stroke. Left side weakness and numbness. History of lung cancer. Head and neck CT CT with contrast yesterday demonstrating central venous thrombosis, right IJ thrombosis involving the right transverse and sigmoid sinus also. EXAM: CT  VENOGRAM HEAD TECHNIQUE: Venographic phase images of the brain were obtained following the administration of intravenous contrast. Multiplanar reformats and maximum intensity projections were generated. RADIATION DOSE REDUCTION: This exam was performed according to the departmental dose-optimization program which includes automated exposure control, adjustment of the mA and/or kV according to patient size and/or use of iterative reconstruction technique. CONTRAST:  75mL OMNIPAQUE  IOHEXOL  300 MG/ML  SOLN COMPARISON:  Head and neck CT yesterday.  Brain MRI 11/09/2021. FINDINGS: CT HEAD Post-contrast only. Stable cerebral volume. Chronic lacunar infarcts in the bilateral brain. No evidence of intracranial mass effect, acute ventriculomegaly, acute intracranial hemorrhage, acute cerebral edema. CTV HEAD Venous sinuses: Maintained enhancement and patency of the superior sagittal sinus, torcula, straight sinus, inferior sagittal sinus, internal cerebral veins. Maintained enhancement and patency of the left transverse, left sigmoid venous sinus, and visible left IJ bulb. Cavernous sinus enhancement appears symmetric and within normal limits also. Nonenhancing, thrombosed right IJ bulb, right sigmoid venous sinus, and distal half of the right transverse sinus (beginning on series 2, image 25. The medial right transverse sinus remains enhancing. The appearance is unchanged from head and neck CT yesterday. Anatomic variants: None significant. Other findings: Calcified atherosclerosis at the skull base. Review of the MIP images confirms the above findings IMPRESSION: 1. Stable Thrombosis of the right IJ, right sigmoid sinus, and lateral half of the right transverse dural venous sinus since the CT Head and Neck yesterday. 2. No other intracranial venous thrombosis identified. No intracranial mass effect, hemorrhage, or acute cerebral edema identified. Electronically Signed   By: VEAR Hurst M.D.   On: 10/19/2023 08:20   CT Soft  Tissue Neck W Contrast Result Date: 10/18/2023 CLINICAL DATA:  Initial evaluation for acute facial swelling. EXAM: CT NECK WITH CONTRAST TECHNIQUE: Multidetector CT imaging of the neck was performed using the standard protocol following the bolus administration of intravenous contrast. RADIATION DOSE REDUCTION: This exam was performed according to the departmental dose-optimization program which includes automated exposure control, adjustment of the mA and/or kV according to patient size and/or use of iterative reconstruction technique. CONTRAST:  OMNIPAQUE  IOHEXOL  350 MG/ML SOLN COMPARISON:  None Available. FINDINGS: Pharynx and larynx: Oral cavity within normal limits. Oropharynx and nasopharynx within normal limits. No retropharyngeal collection or swelling. Negative epiglottis. Hypopharynx and supraglottic larynx within normal limits. Negative glottis. Subglottic airway clear. Salivary glands: Salivary glands including the parotid and submandibular glands are within normal limits. Thyroid : Normal. Lymph nodes: No enlarged or pathologic lymph nodes within the neck. Vascular: Occlusive thrombus seen involving the brachiocephalic vein, extending into the visualized SVC. The vessels are expanded with hazy inflammatory stranding, consistent with acute thrombus. Clot extends to partially involve the right greater than left subclavian veins as well as the right axillary vein. Cephalad extension with subocclusive thrombus extending throughout the right internal jugular vein, with extension into the cranial vault to involve the right transverse and sigmoid sinuses. Left IJ remains patent. Multiple prominent venous collaterals noted within the right neck and visualized right chest wall. Normal arterial enhancement seen throughout  the neck. Aortic atherosclerosis noted. Limited intracranial: No acute finding. Visualized orbits: No acute finding. Mastoids and visualized paranasal sinuses: Mild mucoperiosteal  thickening about the ethmoidal air cells and maxillary sinuses. Right mastoid and middle ear effusion. Left mastoid air cells are clear. Skeleton: No worrisome osseous lesions. Upper chest: Approximate 5 cm thick walled cavitary mass at the posterior right upper lobe, partially visualize, better evaluated on concomitant CT of the chest. Other: None. IMPRESSION: 1. Acute occlusive and subocclusive thrombus involving the brachiocephalic vein, extending into the visualized SVC. Clot extends to partially involve the right greater than left subclavian veins as well as the right axillary vein. Cephalad extension with subocclusive thrombus extending throughout the right internal jugular vein, with extension into the cranial vault to involve the right transverse and sigmoid sinuses. 2. Approximate 5 cm thick walled cavitary mass at the posterior right upper lobe, partially visualized, better evaluated on concomitant CT of the chest. 3. Right mastoid and middle ear effusion, of uncertain significance. Correlation with physical exam recommended. 4.  Aortic Atherosclerosis (ICD10-I70.0). Electronically Signed   By: Morene Hoard M.D.   On: 10/18/2023 19:21   CT Head W or Wo Contrast Result Date: 10/18/2023 CLINICAL DATA:  Initial evaluation for acute headache, history of malignancy. EXAM: CT HEAD WITHOUT AND WITH CONTRAST TECHNIQUE: Contiguous axial images were obtained from the base of the skull through the vertex without and with intravenous contrast. RADIATION DOSE REDUCTION: This exam was performed according to the departmental dose-optimization program which includes automated exposure control, adjustment of the mA and/or kV according to patient size and/or use of iterative reconstruction technique. CONTRAST:  OMNIPAQUE  IOHEXOL  350 MG/ML SOLN COMPARISON:  Prior CT from 09/20/2023 FINDINGS: Brain: Generalized age-related cerebral atrophy. Patchy and confluent hypodensity involving the supratentorial cerebral  white matter, consistent with chronic small vessel ischemic disease, moderate in nature. Multiple scattered remote lacunar infarcts noted about the bilateral basal ganglia right thalamus, stable. No acute intracranial hemorrhage. No acute large vessel territory infarct. No mass lesion or midline shift. No hydrocephalus or extra-axial fluid collection. Vascular: No abnormal hyperdense vessel seen prior to contrast administration. Calcified atherosclerosis present at the skull base. Following contrast administration, filling defect within the right transverse and sigmoid sinuses extending into the right jugular bulb and visualized proximal right internal jugular vein, consistent with dural venous sinus thrombosis. This appears to extend inferiorly throughout the right IJ within the neck. Skull: Scalp soft tissues demonstrate no acute finding. Calvarium intact. Sinuses/Orbits: Globes orbital soft tissues within normal limits. Mild mucoperiosteal thickening present about the paranasal sinuses. Moderate right mastoid and middle ear effusion. Left mastoid air cells are clear. Other: None. IMPRESSION: 1. Filling defect within the right transverse and sigmoid sinuses extending into the right jugular bulb and visualized proximal right internal jugular vein, consistent with dural venous sinus thrombosis. This extends inferiorly throughout the right IJ within the neck. 2. No other acute intracranial abnormality. 3. Moderate right mastoid and middle ear effusion, of uncertain significance. Correlation with physical exam recommended. 4. Underlying age-related cerebral atrophy with moderate chronic small vessel ischemic disease, with multiple remote lacunar infarcts about the bilateral basal ganglia and right thalamus. Electronically Signed   By: Morene Hoard M.D.   On: 10/18/2023 19:11   CT Angio Chest PE W and/or Wo Contrast Addendum Date: 10/18/2023 ADDENDUM REPORT: 10/18/2023 18:55 ADDENDUM: Results were discussed  with Dr. Dreama at 6:41 p.m. [place] on October 18, 2023. Electronically Signed   By: Suzen Dwane HERO.D.  On: 10/18/2023 18:55   Result Date: 10/18/2023 CLINICAL DATA:  History of lung cancer presenting with chest tightness. EXAM: CT ANGIOGRAPHY CHEST WITH CONTRAST TECHNIQUE: Multidetector CT imaging of the chest was performed using the standard protocol during bolus administration of intravenous contrast. Multiplanar CT image reconstructions and MIPs were obtained to evaluate the vascular anatomy. RADIATION DOSE REDUCTION: This exam was performed according to the departmental dose-optimization program which includes automated exposure control, adjustment of the mA and/or kV according to patient size and/or use of iterative reconstruction technique. CONTRAST:  OMNIPAQUE  IOHEXOL  350 MG/ML SOLN COMPARISON:  October 14, 2023 FINDINGS: Cardiovascular: There is marked severity calcification of the aortic arch without evidence of aortic aneurysm or dissection. The segmental and subsegmental pulmonary arteries are limited in evaluation secondary to suboptimal opacification with intravenous contrast. As result, pulmonary embolism within the segmental branches of the right lower lobe cannot be excluded (axial CT images 132 through 142, CT series 6). Occlusive thrombus is noted within the brachiocephalic vein. This is present on the prior study. Nonocclusive thrombus is seen within the superior vena cava (axial CT images 39 through 45, CT series 5). Nonocclusive thrombus is also noted within the azygous vein (axial CT images 44 through 78, CT series 5). A partially thrombosed venous structure is seen within the anteromedial aspect of the upper right lung (axial CT image 44 through 53, CT series 5). Extensive, nonocclusive thrombus is noted within the right axillary vein and right subclavian vein (axial CT images 5 through 52, CT series 5) Numerous thin, tortuous, enhancing venous structures are seen within  the anterior and posterior aspects of the chest wall on the right. Normal heart size with mild to moderate severity coronary artery calcification and marked severity calcification of the mitral annulus. No pericardial effusion. Mediastinum/Nodes: There is mild right hilar lymphadenopathy. Thyroid  gland, trachea, and esophagus demonstrate no significant findings. Lungs/Pleura: A 4.4 cm x 4.8 cm x 4.6 cm thick walled cavitary lesion is again seen within the posterior aspect of the right upper lobe. Mild anteromedial right middle lobe linear atelectasis is noted. No pleural effusion or pneumothorax identified. Upper Abdomen: There is a small hiatal hernia. Diffuse fatty infiltration of the liver parenchyma is noted. Noninflamed diverticula are seen throughout the visualized portions of the large bowel. Musculoskeletal: No chest wall abnormality. No acute or significant osseous findings. Review of the MIP images confirms the above findings. IMPRESSION: 1. Limited evaluation of the segmental and subsegmental pulmonary arteries secondary to suboptimal opacification with intravenous contrast. As result, pulmonary embolism within the segmental branches of the right lower lobe cannot be excluded. 2. Occlusive thrombus within the brachiocephalic vein, nonocclusive thrombus within the superior vena cava and azygous vein, and extensive, nonocclusive thrombus within the right axillary vein and right subclavian vein. 3. 4.4 cm x 4.8 cm x 4.6 cm thick walled cavitary lesion within the posterior aspect of the right upper lobe which is seen on the prior study and may be consistent with the patient's history of lung cancer. 4. Mild right hilar lymphadenopathy. 5. Small hiatal hernia. 6. Hepatic steatosis. 7. Colonic diverticulosis. 8. Aortic atherosclerosis. Electronically Signed: By: Suzen Dials M.D. On: 10/18/2023 18:52   DG Chest 2 View Result Date: 10/18/2023 CLINICAL DATA:  Chest pain EXAM: CHEST - 2 VIEW COMPARISON:   Chest radiograph October 14, 2023, CT angio chest October 14, 2023 FINDINGS: Thick-walled cavitary lesion with air-fluid level in right upper lobe, measuring approximately 5 cm. No new lesion or consolidation identified. Cardiomediastinal  silhouette is enlarged. Fullness of right paratracheal correlating to mediastinal lymphadenopathy. No pleural effusion or pneumothorax. Multilevel degenerative changes of the spine. Nodular opacity superimposing lower thoracic spine on lateral view correlates to osteophytes better assessed on recent CT angio chest. IMPRESSION: Thick-walled cavitary lesion of right upper lobe. Right lower paratracheal mediastinal lymphadenopathy. Electronically Signed   By: Megan  Zare M.D.   On: 10/18/2023 14:34   CT Angio Chest PE W and/or Wo Contrast Result Date: 10/14/2023 CLINICAL DATA:  Increased shortness of breath, history of lung cancer in 2008 normal pulmonary embolism suspected, positive D-dimer EXAM: CT ANGIOGRAPHY CHEST WITH CONTRAST TECHNIQUE: Multidetector CT imaging of the chest was performed using the standard protocol during bolus administration of intravenous contrast. Multiplanar CT image reconstructions and MIPs were obtained to evaluate the vascular anatomy. RADIATION DOSE REDUCTION: This exam was performed according to the departmental dose-optimization program which includes automated exposure control, adjustment of the mA and/or kV according to patient size and/or use of iterative reconstruction technique. CONTRAST:  75mL OMNIPAQUE  IOHEXOL  350 MG/ML SOLN COMPARISON:  Same day chest radiograph, November 09, 2021 CT FINDINGS: Cardiovascular: Satisfactory opacification of the pulmonary arteries to the segmental level. No evidence of pulmonary embolism. No pericardial effusion. Nonspecific narrowing of left brachiocephalic vein and upper SVC due to mediastinal lymphadenopathy with venous reflux into the left chest wall collaterals. Mediastinum/Nodes: Bulky right paratracheal  lymphadenopathy, measuring up to 3.1 x 2.5 cm there are also some mildly prominent, subcentimeter bilateral supraclavicular lymph nodes. Lungs/Pleura: Thick-walled, cavitary right upper lobe mass measuring approximately 5.7 x 5.3 cm. There was a small pulmonary nodule at this location on prior CT performed November 09, 2021. No pleural effusions. No pneumothorax. Upper Abdomen: No acute findings. Musculoskeletal: No acute osseous findings. Review of the MIP images confirms the above findings. IMPRESSION: 1. Cavitary right upper lobe mass and bulky mediastinal lymphadenopathy, concerning for primary lung malignancy. Tissue biopsy may be helpful to confirm diagnosis and exclude possible mycobacterial infection. 2. Nonspecific narrowing of upper SVC and brachiocephalic veins due to mediastinal lymphadenopathy. 3. Nonspecific, mildly prominent subcentimeter bilateral supraclavicular lymph nodes, felt to be reactive related to the central venous stenosis, but difficult to exclude metastatic disease. Electronically Signed   By: Michaeline Blanch M.D.   On: 10/14/2023 12:49   DG Chest 2 View Result Date: 10/14/2023 CLINICAL DATA:  SOB EXAM: CHEST - 2 VIEW COMPARISON:  January 29, 2022 FINDINGS: Thick-walled cavity in the right upper lobe, measuring 5.5 cm. No lobar consolidation, pleural effusion, or pneumothorax. Fullness of the right suprahilar region. No cardiomegaly. Aortic atherosclerosis. No acute fracture or destructive lesions. Multilevel thoracic osteophytosis. IMPRESSION: 1. Thick-walled cavity in the right upper lobe, measuring 5.5 cm. This may represent either TB or neoplasm. Appropriate isolation precautions. 2. Fullness of the right suprahilar region is also present, worrisome for lymphadenopathy. A follow-up chest CT with IV contrast recommended for further characterization. Critical Value/emergent results were called by telephone at the time of interpretation on 10/14/2023 at 8:36 am to provider Andrea Mullen , who  verbally acknowledged these results. Electronically Signed   By: Andrea Mullen M.D.   On: 10/14/2023 08:37     The results of significant diagnostics from this hospitalization (including imaging, microbiology, ancillary and laboratory) are listed below for reference.     Microbiology: Recent Results (from the past 240 hours)  MRSA Next Gen by PCR, Nasal     Status: None   Collection Time: 10/19/23  1:55 PM   Specimen: Nasal Mucosa; Nasal  Swab  Result Value Ref Range Status   MRSA by PCR Next Gen NOT DETECTED NOT DETECTED Final    Comment: (NOTE) The GeneXpert MRSA Assay (FDA approved for NASAL specimens only), is one component of a comprehensive MRSA colonization surveillance program. It is not intended to diagnose MRSA infection nor to guide or monitor treatment for MRSA infections. Test performance is not FDA approved in patients less than 57 years old. Performed at [facility], [address] [place], [place] [address]   Surgical pcr screen     Status: None   Collection Time: 10/21/23  8:25 PM   Specimen: Nasal Mucosa; Nasal Swab  Result Value Ref Range Status   MRSA, PCR NEGATIVE NEGATIVE Final   Staphylococcus aureus NEGATIVE NEGATIVE Final    Comment: (NOTE) The Xpert SA Assay (FDA approved for NASAL specimens in patients 86 years of age and older), is one component of a comprehensive surveillance program. It is not intended to diagnose infection nor to guide or monitor treatment. Performed at [facility], [address], [place], [place] [address]   Culture, BAL-quantitative w Gram Stain     Status: Abnormal   Collection Time: 10/22/23  8:48 AM   Specimen: Bronchial Alveolar Lavage; Respiratory  Result Value Ref Range Status   Specimen Description BRONCHIAL ALVEOLAR LAVAGE  Final   Special Requests NONE  Final   Gram Stain NO WBC SEEN NO ORGANISMS SEEN   Final   Culture (A)  Final    20,000 COLONIES/mL Normal respiratory flora-no Staph aureus or  Pseudomonas seen Performed at [facility], [address], [place], [place] [address]    Report Status 10/25/2023 FINAL  Final  Acid Fast Smear (AFB)     Status: None   Collection Time: 10/22/23  8:48 AM   Specimen: Bronchial Alveolar Lavage; Respiratory  Result Value Ref Range Status   AFB Specimen Processing Concentration  Final   Acid Fast Smear Negative  Final    Comment: (NOTE) Performed At: [facility] [address] [place], [place] [id number] Andrea Shorter MD Ey:[id number]    Source (AFB) BRONCHIAL ALVEOLAR LAVAGE  Final    Comment: Performed at [facility], [address], [place], [place] [address]     Labs: BNP (last 3 results) Recent Labs    10/14/23 0835  BNP 103.2*   Basic Metabolic Panel: Recent Labs  Lab 10/22/23 0422 10/23/23 0617 10/24/23 0150 10/25/23 1237 10/26/23 1542 10/27/23 0756  NA 138 135 135 137 137 137  K 4.2 4.2 4.2 3.8 4.1 4.0  CL 102 100 100 103 102 102  CO2 24 25 24 24 26 24   GLUCOSE 96 99 107* 100* 106* 100*  BUN 11 8 10 11 10 10   CREATININE 0.71 0.77 0.75 0.78 0.80 0.78  CALCIUM  9.0 9.0 9.0 9.1 9.0 9.3  MG 2.0 1.8  --   --   --  2.1  PHOS  --   --   --  3.1  --   --    Liver Function Tests: Recent Labs  Lab 10/22/23 0422 10/23/23 0617 10/24/23 0150  AST 17 21 20   ALT 16 16 16   ALKPHOS 57 58 59  BILITOT 0.6 0.5 0.5  PROT 7.1 7.0 7.0  ALBUMIN 3.0* 2.8* 2.9*   No results for input(s): LIPASE, AMYLASE in the last 168 hours. No results for input(s): AMMONIA in the last 168 hours. CBC: Recent Labs  Lab 10/22/23 0422  10/23/23 0617 10/24/23 0150 10/26/23 1542 10/27/23 0756  WBC 4.1 5.3 5.8 6.6 5.4  HGB 9.7* 10.2* 9.7* 9.9* 10.6*  HCT 31.0* 31.7* 30.4* 32.1* 33.9*  MCV 88.1 86.6 87.4 88.9 88.7  PLT 206 194 194 196 225   Cardiac Enzymes: No results for input(s): CKTOTAL, CKMB, CKMBINDEX, TROPONINI in the last 168 hours. BNP: Invalid input(s): POCBNP CBG: No results for  input(s): GLUCAP in the last 168 hours. D-Dimer No results for input(s): DDIMER in the last 72 hours. Hgb A1c No results for input(s): HGBA1C in the last 72 hours. Lipid Profile No results for input(s): CHOL, HDL, LDLCALC, TRIG, CHOLHDL, LDLDIRECT in the last 72 hours. Thyroid  function studies No results for input(s): TSH, T4TOTAL, T3FREE, THYROIDAB in the last 72 hours.  Invalid input(s): FREET3 Anemia work up No results for input(s): VITAMINB12, FOLATE, FERRITIN, TIBC, IRON, RETICCTPCT in the last 72 hours. Urinalysis    Component Value Date/Time   COLORURINE YELLOW 09/21/2023 0159   APPEARANCEUR HAZY (A) 09/21/2023 0159   LABSPEC 1.018 09/21/2023 0159   PHURINE 5.0 09/21/2023 0159   GLUCOSEU NEGATIVE 09/21/2023 0159   HGBUR NEGATIVE 09/21/2023 0159   BILIRUBINUR NEGATIVE 09/21/2023 0159   KETONESUR NEGATIVE 09/21/2023 0159   PROTEINUR NEGATIVE 09/21/2023 0159   UROBILINOGEN 1.0 04/08/2014 1729   NITRITE NEGATIVE 09/21/2023 0159   LEUKOCYTESUR NEGATIVE 09/21/2023 0159   Sepsis Labs Recent Labs  Lab 10/23/23 0617 10/24/23 0150 10/26/23 1542 10/27/23 0756  WBC 5.3 5.8 6.6 5.4   Microbiology Recent Results (from the past 240 hours)  MRSA Next Gen by PCR, Nasal     Status: None   Collection Time: 10/19/23  1:55 PM   Specimen: Nasal Mucosa; Nasal Swab  Result Value Ref Range Status   MRSA by PCR Next Gen NOT DETECTED NOT DETECTED Final    Comment: (NOTE) The GeneXpert MRSA Assay (FDA approved for NASAL specimens only), is one component of a comprehensive MRSA colonization surveillance program. It is not intended to diagnose MRSA infection nor to guide or monitor treatment for MRSA infections. Test performance is not FDA approved in patients less than 53 years old. Performed at [facility], [address] [place], [place] [address]   Surgical pcr screen     Status: None   Collection Time: 10/21/23  8:25 PM   Specimen:  Nasal Mucosa; Nasal Swab  Result Value Ref Range Status   MRSA, PCR NEGATIVE NEGATIVE Final   Staphylococcus aureus NEGATIVE NEGATIVE Final    Comment: (NOTE) The Xpert SA Assay (FDA approved for NASAL specimens in patients 57 years of age and older), is one component of a comprehensive surveillance program. It is not intended to diagnose infection nor to guide or monitor treatment. Performed at [facility], [address] [place], [place] [address]   Culture, BAL-quantitative w Gram Stain     Status: Abnormal   Collection Time: 10/22/23  8:48 AM   Specimen: Bronchial Alveolar Lavage; Respiratory  Result Value Ref Range Status   Specimen Description BRONCHIAL ALVEOLAR LAVAGE  Final   Special Requests NONE  Final   Gram Stain NO WBC SEEN NO ORGANISMS SEEN   Final   Culture (A)  Final    20,000 COLONIES/mL Normal respiratory flora-no Staph aureus or Pseudomonas seen Performed at [facility], [address], [place], [place] [address]    Report Status 10/25/2023 FINAL  Final  Acid Fast Smear (AFB)     Status: None  Collection Time: 10/22/23  8:48 AM   Specimen: Bronchial Alveolar Lavage; Respiratory  Result Value Ref Range Status   AFB Specimen Processing Concentration  Final   Acid Fast Smear Negative  Final    Comment: (NOTE) Performed At: [facility] [address] [place], [place] [id number] Andrea Shorter MD Ey:[id number]    Source (AFB) BRONCHIAL ALVEOLAR LAVAGE  Final    Comment: Performed at [facility], [address], [place], [place] [address]     Time coordinating discharge:  I have spent 35 minutes face to face with the patient and on the ward discussing the patients care, assessment, plan and disposition with other care givers. >50% of the time was devoted counseling the patient about the risks and benefits of treatment/Discharge disposition and coordinating care.   SIGNED:   Burgess JAYSON Dare, MD  [facility] 10/27/2023,  12:49 PM   If 7PM-7AM, please contact night-coverage

## 2023-10-29 NOTE — Progress Notes (Signed)
 NN reached out to pt at mobile/home number listed in demographics, however there was a technical difficulty that prevented the pt from hearing the NN speak. NN then reached out to the pt's dtr, [name]. [name] states she is the one who takes her to her medical appts. NN wanted to make self introduction and confirm if pt would be able to make an appt scheduled for the pt on 9/23 at 1:45 for labs and 2:15 for a consult with Dr Sherrod. [name] asked if there is an appt earlier in the day, however later stated that she will have one of her sisters get the pt to the appt. NN provided [name] with direct contact information in case she or the pt need to get ahold of NN.

## 2023-11-02 ENCOUNTER — Inpatient Hospital Stay

## 2023-11-02 ENCOUNTER — Other Ambulatory Visit: Payer: Self-pay | Admitting: Physician Assistant

## 2023-11-02 ENCOUNTER — Telehealth: Payer: Self-pay | Admitting: Physician Assistant

## 2023-11-02 ENCOUNTER — Ambulatory Visit: Admitting: Internal Medicine

## 2023-11-02 DIAGNOSIS — J984 Other disorders of lung: Secondary | ICD-10-CM

## 2023-11-02 NOTE — Progress Notes (Addendum)
 [facility] [phone]   [phone]  CONSULT NOTE  REFERRING PHYSICIAN: Dr. Ula  REASON FOR CONSULTATION:  Non-Small cell lung cancer  HPI Andrea Mullen is a 58 y.o. female with a PMHx significant for right sided small cell lung cancer treated in 2007 at [facility] with chemo/radiation and PCI, NSTEMI, stroke, hypertension, aortic stenosis, superior vena cava thrombus, obesity, polysubstance abuse, and hyperlipidemia is referred to the clinic for lung cancer.  The patient has a history of small cell lung cancer for which she underwent treatment at [facility] in 2007.  She underwent treatment with chemo/radiation and PCI.    Her most recent evaluation began when she presented to the hospital on 10/18/2023 with a chief complaint of shortness of breath and chest pain.  She was found to have a head and neck thrombus/dural sinus thrombosis and acute CVA.  An outpatient basis she is currently on Eliquis .   She was given IV heparin .    CT angiogram performed on 10/18/2023 in addition to the thrombus showed a 4.4 x 4.8 x 4.6 cm thick-walled cavitary lesion in the posterior right upper lobe.  She was also found to have mild right hilar lymphadenopathy.  She had a brain MRI which was negative for metastatic disease to the brain.  Had a CT of the abdomen and pelvis which did not show any evidence of metastatic disease in the abdomen or pelvis.  She also had CTA on 10/14/23 which measured the mass cavitary right upper lobe mass measuring approximately 5.7 x 5.3 cm and Nonspecific, mildly prominent subcentimeter bilateral supraclavicular lymph nodes, felt to be reactive related to the central venous stenosis, but difficult to exclude metastatic disease.  She underwent bronchoscopy by Dr. Jude on 10/22/23 during her admission and the final pathology for 4R lymph node showed non-small cell lung cancer, squamous cell carcinoma.  Overall the patient is weak and  fatigued. She wants to smoke a cigarette but is frustrated her daughter won't let her. She is sleeping a lot. She intermittently has facial swelling that comes and goes and improves with positioning. She has a cane, walker, and commode at home. She has baseline anemia. She reports cold intolerance.   She denies any fever, night sweats, or unexplained weight loss.  She reports a good appetite.  Her shortness of breath is better since she got discharge from the hospital and she is not using her inhaler as frequently as before but she still has shortness of breath.  He has intermittent cough and is not taking any cough medications.  She completed antibiotics.  She denies any nausea or vomiting.  She has not had a bowel movement in 2 days but does not like taking laxatives and she missed her stool softener dose.  Denies any headache or visual changes.  Her family history consist of a mother who had blood cancer, maternal grandma with uterine cancer and a family member with prostate cancer.  She also reports family history of hypertension and diabetes.  The patient does not work.  She is single.  She has 7 children.  She has not smoked in approximately 1 month.  She had smoked about 30 years averaging 1 pack of cigarettes per day.  She used to drink heavily but does not drink anymore.  She also has a history of cocaine  use but has not used cocaine  in 2 years.    HPI  Past Medical History:  Diagnosis Date  Anxiety    Asthma    Blind    left eye   Cancer (HCC)    lung, tx radiation and chemo   Hyperlipemia    Hypertension    Stroke [facility])     Past Surgical History:  Procedure Laterality Date   CARDIAC CATHETERIZATION N/A 11/01/2014   Procedure: Left Heart Cath and Coronary Angiography;  Surgeon: Victory LELON Sharps, MD;  Location: [facility] INVASIVE CV LAB;  Service: Cardiovascular;  Laterality: N/A;   CESAREAN SECTION     x3   HERNIA REPAIR     VIDEO BRONCHOSCOPY WITH ENDOBRONCHIAL ULTRASOUND Right  10/22/2023   Procedure: BRONCHOSCOPY, WITH EBUS;  Surgeon: Jude Harden GAILS, MD;  Location: [facility] ENDOSCOPY;  Service: Cardiopulmonary;  Laterality: Right;    Family History  Problem Relation Age of Onset   Clotting disorder Mother     Social History Social History   Tobacco Use   Smoking status: Every Day    Current packs/day: 0.50    Types: Cigarettes   Smokeless tobacco: Never   Tobacco comments:    Pt stopped smoking 06/04/22 [facility]   Vaping Use   Vaping status: Never Used  Substance Use Topics   Alcohol use: Not Currently    Comment: Sober x 1 month   Drug use: Not Currently    Types: Marijuana, Cocaine     Comment: none x approx 1 month    Allergies  Allergen Reactions   Bicillin C-R Hives   Penicillins Hives        Prednisone  Anaphylaxis    seizure   Nsaids Hives    Hives     Current Outpatient Medications  Medication Sig Dispense Refill   albuterol  (PROVENTIL ) (2.5 MG/3ML) 0.083% nebulizer solution Take 3 mLs (2.5 mg total) by nebulization every 6 (six) hours as needed for wheezing or shortness of breath. (Patient not taking: Reported on 10/19/2023) 75 mL 12   albuterol  (VENTOLIN  HFA) 108 (90 Base) MCG/ACT inhaler Inhale 2 puffs into the lungs every 4 (four) hours as needed for wheezing or shortness of breath. 18 g 0   apixaban  (ELIQUIS ) 5 MG TABS tablet Take 1 tablet (5 mg total) by mouth 2 (two) times daily. 60 tablet 0   butalbital -acetaminophen -caffeine  (FIORICET) 50-325-40 MG tablet Take 1-2 tablets by mouth every 6 (six) hours as needed for headache. (Patient not taking: Reported on 10/19/2023) 20 tablet 0   cyclobenzaprine  (FLEXERIL ) 5 MG tablet Take 1 tablet (5 mg total) by mouth 3 (three) times daily as needed for muscle spasms. 30 tablet 0   HYDROcodone -acetaminophen  (NORCO/VICODIN) 5-325 MG tablet Take 1 tablet by mouth every 6 (six) hours as needed for moderate pain (pain score 4-6). 30 tablet 0   polyethylene glycol powder (GLYCOLAX /MIRALAX ) 17 GM/SCOOP powder  Take 17 g by mouth 2 (two) times daily as needed for mild constipation or moderate constipation. Dissolve 1 capful (17g) in 4-8 ounces of liquid and take by mouth daily. 238 g 0   rosuvastatin  (CRESTOR ) 40 MG tablet Take 1 tablet (40 mg total) by mouth daily. (Patient not taking: Reported on 10/19/2023) 30 tablet 3   senna-docusate (SENOKOT-S) 8.6-50 MG tablet Take 1 tablet by mouth 2 (two) times daily. 60 tablet 0   Vitamin D, Ergocalciferol, (DRISDOL) 1.25 MG (50000 UNIT) CAPS capsule Take 50,000 Units by mouth once a week. (Patient not taking: Reported on 10/19/2023)     No current facility-administered medications for this visit.    REVIEW OF SYSTEMS:   Review of Systems  Constitutional: Positive for fatigue.  Negative for appetite change, chills, fever and unexpected weight change.  HENT:   Negative for mouth sores, nosebleeds, sore throat and trouble swallowing.   Eyes: Negative for eye problems and icterus.  Respiratory: Positive for shortness of breath (improved).  Positive for persistent cough.  Negative for hemoptysis and wheezing.   Cardiovascular: Negative for chest pain and leg swelling.  Gastrointestinal: Positive for constipation.  Negative for abdominal pain,  diarrhea, nausea and vomiting.  Genitourinary: Negative for bladder incontinence, difficulty urinating, dysuria, frequency and hematuria.   Musculoskeletal: Negative for back pain, gait problem, neck pain and neck stiffness.  Skin: Negative for itching and rash.  Neurological: Negative for dizziness, extremity weakness, gait problem, headaches, light-headedness and seizures.  Hematological: Negative for adenopathy. Does not bruise/bleed easily.  Psychiatric/Behavioral: Negative for confusion, depression and sleep disturbance. The patient is not nervous/anxious.     PHYSICAL EXAMINATION:  Last menstrual period 11/16/2016.  ECOG PERFORMANCE STATUS: 2  Physical Exam  Constitutional: Oriented to person, place, and time and  chronically ill appearing female and in no distress. HENT:  Head: Normocephalic and atraumatic.  Mouth/Throat: Oropharynx is clear and moist. No oropharyngeal exudate.  Eyes: Conjunctivae are normal. Right eye exhibits no discharge. Left eye exhibits no discharge. No scleral icterus.  Neck: Normal range of motion. Neck supple.  Cardiovascular: Normal rate, regular rhythm, normal heart sounds and intact distal pulses.   Pulmonary/Chest: Effort normal. Quiet breath sounds. No respiratory distress. No wheezes. No rales.  Abdominal: Soft. Bowel sounds are normal. Exhibits no distension and no mass. There is no tenderness.  Musculoskeletal: Normal range of motion. Exhibits no edema.  Lymphadenopathy:    No cervical adenopathy.  Neurological: Alert and oriented to person, place, and time. Exhibits also wasting.  She was examined in the wheelchair.  Skin: Skin is warm and dry. No rash noted. Not diaphoretic. No erythema. No pallor.  Psychiatric: Mood, memory and judgment normal.  Vitals reviewed.  LABORATORY DATA: Lab Results  Component Value Date   WBC 5.4 10/27/2023   HGB 10.6 (L) 10/27/2023   HCT 33.9 (L) 10/27/2023   MCV 88.7 10/27/2023   PLT 225 10/27/2023      Chemistry      Component Value Date/Time   NA 137 10/27/2023 0756   K 4.0 10/27/2023 0756   CL 102 10/27/2023 0756   CO2 24 10/27/2023 0756   BUN 10 10/27/2023 0756   CREATININE 0.78 10/27/2023 0756   CREATININE 0.72 04/03/2021 0000      Component Value Date/Time   CALCIUM  9.3 10/27/2023 0756   ALKPHOS 59 10/24/2023 0150   AST 20 10/24/2023 0150   ALT 16 10/24/2023 0150   BILITOT 0.5 10/24/2023 0150       RADIOGRAPHIC STUDIES: CT ABDOMEN PELVIS W CONTRAST Result Date: 10/25/2023 EXAM: CT ABDOMEN AND PELVIS WITH CONTRAST 10/25/2023 06:32:57 PM TECHNIQUE: CT of the abdomen and pelvis was performed with the administration of intravenous contrast. Multiplanar reformatted images are provided for review. Automated  exposure control, iterative reconstruction, and/or weight-based adjustment of the mA/kV was utilized to reduce the radiation dose to as low as reasonably achievable. COMPARISON: None available. CLINICAL HISTORY: Metastatic disease evaluation. History of lung cancer. Tracking code: Bo. FINDINGS: LOWER CHEST: Visualized lung bases are clear. LIVER: The liver is unremarkable. GALLBLADDER AND BILE DUCTS: Gallbladder is unremarkable. No biliary ductal dilatation. SPLEEN: No acute abnormality. PANCREAS: No acute abnormality. ADRENAL GLANDS: No acute abnormality. KIDNEYS, URETERS AND BLADDER: No stones in  the kidneys or ureters. No hydronephrosis. No perinephric or periureteral stranding. Urinary bladder is unremarkable. GI AND BOWEL: Moderate descending and sigmoid colonic diverticulosis. The stomach, small bowel, and large bowel are otherwise unremarkable. Normal appendix. PERITONEUM AND RETROPERITONEUM: No ascites. No free air. VASCULATURE: Mild aortoiliac atherosclerotic calcification. Extensive calcification of the aortic valve leaflets. Suspected bicuspid aortic valve. Calcification of the mitral valve leaflets. Global cardiac size within normal limits. Echocardiography may be helpful to better assess the aortic valve morphology and the degree of valvular dysfunction. LYMPH NODES: No lymphadenopathy. REPRODUCTIVE ORGANS: Partially calcified uterine fibroids result in a lobulated morphology of the uterus. No adnexal masses are seen. BONES AND SOFT TISSUES: Osseous structures are age appropriate. No acute bone abnormality. No lytic or blastic bone lesion. 4.2 cm rounded subdermal lesion is seen within the left gluteal region which may represent a sebaceous cyst, but is not well characterized on this examination. Umbilical/ventral hernia repair with mesh has been performed. No recurrent abdominal wall hernia. RAF SCORE: Aortic atherosclerosis (ICD10-I70.0) IMPRESSION: 1. No evidence of metastatic disease within the  abdomen and pelvis. 2. Moderate descending and sigmoid colonic diverticulosis without evidence of diverticulitis. 3. Mild aortoiliac atherosclerotic calcification. Electronically signed by: Dorethia Molt MD 10/25/2023 09:28 PM EDT RP Workstation: [id number]   DG CHEST PORT 1 VIEW Result Date: 10/24/2023 CLINICAL DATA:  Shortness of breath. EXAM: PORTABLE CHEST 1 VIEW COMPARISON:  Chest radiograph dated 10/22/2023. FINDINGS: Cavitary mass in the right upper lobe measures 5.2 cm in diameter. No new consolidation. There is no pleural effusion pneumothorax. The cardiac silhouette is within normal limits. No acute osseous pathology. IMPRESSION: Cavitary mass in the right upper lobe. Electronically Signed   By: Vanetta Chou M.D.   On: 10/24/2023 13:50   DG CHEST PORT 1 VIEW Result Date: 10/22/2023 CLINICAL DATA:  Status post bronchoscopy EXAM: PORTABLE CHEST 1 VIEW COMPARISON:  October 18, 2023 FINDINGS: Redemonstration of cavitary right upper lobe lung mass. No new consolidations. No pleural effusions. No pneumothorax. Unchanged cardiomediastinal silhouette with known right paratracheal lymphadenopathy. No acute osseous findings. IMPRESSION: Unchanged right upper lobe cavitary lung mass and right paratracheal lymphadenopathy. No acute findings. Electronically Signed   By: Michaeline Blanch M.D.   On: 10/22/2023 11:58   ECHOCARDIOGRAM COMPLETE Result Date: 10/20/2023    ECHOCARDIOGRAM REPORT   Patient Name:   Andrea Mullen Christofferson Date of Exam: 10/20/2023 Medical Rec #:  [id number]        Height:       65.0 in Accession #:    [id number]       Weight:       216.3 lb Date of Birth:  01/31/1966        BSA:          2.045 m Patient Age:    57 years         BP:           104/73 mmHg Patient Gender: F                HR:           105 bpm. Exam Location:  Inpatient Procedure: 2D Echo, Cardiac Doppler and Color Doppler (Both Spectral and Color            Flow Doppler were utilized during procedure). Indications:    Pulmonary  Embolus  History:        Patient has prior history of Echocardiogram examinations, most  recent 11/09/2021. CAD, Stroke, Signs/Symptoms:Chest Pain; Risk                 Factors:Hypertension and Current Smoker.  Sonographer:    Juliene Rucks Referring Phys: [phone] RONDELL A SMITH IMPRESSIONS  1. Left ventricular ejection fraction, by estimation, is 60 to 65%. The left ventricle has normal function. The left ventricle has no regional wall motion abnormalities. Left ventricular diastolic parameters are consistent with Grade I diastolic dysfunction (impaired relaxation). Elevated left ventricular end-diastolic pressure.  2. Right ventricular systolic function was not well visualized. The right ventricular size is not well visualized.  3. The mitral valve is degenerative. Trivial mitral valve regurgitation. Moderate to severe mitral annular calcification.  4. The aortic valve was not adequately interrogated to determine stenosis - there is calcification of the valve and aortic root. Image 67 demonstrates an aortic valve gradient of at least 30 mmHg peak and 14 mmHg, mean.. The aortic valve has an indeterminant number of cusps. There is severe calcifcation of the aortic valve. Aortic valve regurgitation is trivial. Moderate to severe aortic valve stenosis.  5. The inferior vena cava is normal in size with greater than 50% respiratory variability, suggesting right atrial pressure of 3 mmHg. Comparison(s): Changes from prior study are noted. 11/09/2021: LVEF 60-65%, severe AS - possibly bicuspid aortic valve. Conclusion(s)/Recommendation(s): Consider further evaluation of the aortic valve with TEE. FINDINGS  Left Ventricle: Left ventricular ejection fraction, by estimation, is 60 to 65%. The left ventricle has normal function. The left ventricle has no regional wall motion abnormalities. The left ventricular internal cavity size was normal in size. There is  no left ventricular hypertrophy. Left ventricular  diastolic parameters are consistent with Grade I diastolic dysfunction (impaired relaxation). Elevated left ventricular end-diastolic pressure. Right Ventricle: The right ventricular size is not well visualized. Right vetricular wall thickness was not well visualized. Right ventricular systolic function was not well visualized. Left Atrium: Left atrial size was normal in size. Right Atrium: Right atrial size was normal in size. Pericardium: There is no evidence of pericardial effusion. Mitral Valve: The mitral valve is degenerative in appearance. Moderate to severe mitral annular calcification. Trivial mitral valve regurgitation. Tricuspid Valve: The tricuspid valve is not well visualized. Tricuspid valve regurgitation is not demonstrated. Aortic Valve: The aortic valve was not adequately interrogated to determine stenosis - there is calcification of the valve and aortic root. Image 67 demonstrates an aortic valve gradient of at least 30 mmHg peak and 14 mmHg, mean. The aortic valve has an  indeterminant number of cusps. There is severe calcifcation of the aortic valve. There is moderate aortic valve annular calcification. Aortic valve regurgitation is trivial. Moderate to severe aortic stenosis is present. Pulmonic Valve: The pulmonic valve was not well visualized. Pulmonic valve regurgitation is not visualized. Aorta: The aortic root and ascending aorta are structurally normal, with no evidence of dilitation. Venous: The inferior vena cava is normal in size with greater than 50% respiratory variability, suggesting right atrial pressure of 3 mmHg. IAS/Shunts: The interatrial septum was not well visualized.  LEFT VENTRICLE PLAX 2D LVIDd:         5.20 cm   Diastology LVIDs:         4.60 cm   LV e' medial:    4.24 cm/s LV PW:         0.80 cm   LV E/e' medial:  30.2 LV IVS:        0.90 cm   LV e' lateral:  7.07 cm/s LVOT diam:     1.70 cm   LV E/e' lateral: 18.1 LVOT Area:     2.27 cm  RIGHT VENTRICLE RV S prime:      5.34 cm/s LEFT ATRIUM           Index LA diam:      3.00 cm 1.47 cm/m LA Vol (A2C): 48.1 ml 23.52 ml/m LA Vol (A4C): 40.9 ml 20.00 ml/m   AORTA Ao Root diam: 2.60 cm MITRAL VALVE MV Area (PHT): 4.39 cm     SHUNTS MV Decel Time: 173 msec     Systemic Diam: 1.70 cm MV E velocity: 128.00 cm/s MV A velocity: 162.00 cm/s MV E/A ratio:  0.79 Vinie Maxcy MD Electronically signed by Vinie Maxcy MD Signature Date/Time: 10/20/2023/6:31:01 PM    Final    VAS US  LOWER EXTREMITY VENOUS (DVT) Result Date: 10/20/2023  Lower Venous DVT Study Patient Name:  SENDY PLUTA  Date of Exam:   10/20/2023 Medical Rec #: [id number]         Accession #:    [id number] Date of Birth: [birth year]         Patient Gender: F Patient Age:   42 years Exam Location:  [facility] Procedure:      VAS US  LOWER EXTREMITY VENOUS (DVT) Referring Phys: MAXIMINO SHARPS --------------------------------------------------------------------------------  Indications: Pulmonary embolism.  Performing Technologist: Elmarie Lindau, RVT  Examination Guidelines: A complete evaluation includes B-mode imaging, spectral Doppler, color Doppler, and power Doppler as needed of all accessible portions of each vessel. Bilateral testing is considered an integral part of a complete examination. Limited examinations for reoccurring indications may be performed as noted. The reflux portion of the exam is performed with the patient in reverse Trendelenburg.  +---------+---------------+---------+-----------+----------+--------------+ RIGHT    CompressibilityPhasicitySpontaneityPropertiesThrombus Aging +---------+---------------+---------+-----------+----------+--------------+ CFV      Full           Yes      Yes                                 +---------+---------------+---------+-----------+----------+--------------+ SFJ      Full                                                         +---------+---------------+---------+-----------+----------+--------------+ FV Prox  Full                                                        +---------+---------------+---------+-----------+----------+--------------+ FV Mid   Full                                                        +---------+---------------+---------+-----------+----------+--------------+ FV DistalFull                                                        +---------+---------------+---------+-----------+----------+--------------+  PFV      Full                                                        +---------+---------------+---------+-----------+----------+--------------+ POP      Partial                                      Chronic        +---------+---------------+---------+-----------+----------+--------------+ PTV      Full                                                        +---------+---------------+---------+-----------+----------+--------------+ PERO     Full                                                        +---------+---------------+---------+-----------+----------+--------------+   +---------+---------------+---------+-----------+----------+--------------+ LEFT     CompressibilityPhasicitySpontaneityPropertiesThrombus Aging +---------+---------------+---------+-----------+----------+--------------+ CFV      Full           Yes      Yes                                 +---------+---------------+---------+-----------+----------+--------------+ SFJ      Full                                                        +---------+---------------+---------+-----------+----------+--------------+ FV Prox  Full                                                        +---------+---------------+---------+-----------+----------+--------------+ FV Mid   Full                                                         +---------+---------------+---------+-----------+----------+--------------+ FV DistalFull                                                        +---------+---------------+---------+-----------+----------+--------------+ PFV      Full                                                        +---------+---------------+---------+-----------+----------+--------------+  POP      Partial                                      Chronic        +---------+---------------+---------+-----------+----------+--------------+ PTV      Full                                                        +---------+---------------+---------+-----------+----------+--------------+ PERO     Full                                                        +---------+---------------+---------+-----------+----------+--------------+     Summary: RIGHT: - Findings consistent 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  - No cystic structure found in the popliteal fossa.  LEFT: - Findings consistent with chronic deep vein thrombosis involving the left popliteal vein.  - No cystic structure found in the popliteal fossa.  *See table(s) above for measurements and observations. Electronically signed by Lonni Gaskins MD on 10/20/2023 at 2:53:16 PM.    Final    MR Brain W and Wo Contrast Result Date: 10/19/2023 CLINICAL DATA:  Initial evaluation for dural venous sinus thrombosis, acute neuro deficit. EXAM: MRI HEAD WITHOUT AND WITH CONTRAST TECHNIQUE: Multiplanar, multiecho pulse sequences of the brain and surrounding structures were obtained without and with intravenous contrast. CONTRAST:  10mL GADAVIST  GADOBUTROL  1 MMOL/ML IV SOLN COMPARISON:  Comparison made with CT from earlier the same day as well as multiple previous exams. FINDINGS: Brain: Diffuse prominence of the CSF containing spaces compatible generalized cerebral atrophy. Patchy and confluent T2/FLAIR hyperintensity involving the  periventricular deep white matter both cerebral hemispheres, consistent with chronic microvascular ischemic disease, moderately advanced in nature. Mild patchy involvement of the pons and cerebellum noted. Few remote lacunar infarcts noted about the bilateral basal ganglia. Remote right pontine infarct with a few scattered small remote bilateral cerebellar infarcts. 6 mm acute ischemic nonhemorrhagic infarcts seen involving the right splenium (series 5, image 75). No other evidence for acute or subacute ischemia. Gray-white matter differentiation otherwise maintained. No acute intracranial hemorrhage. Multiple scattered chronic micro hemorrhages noted, likely hypertensive in nature. No mass lesion, midline shift, or mass effect. Mild ventricular prominence related to global parenchymal volume loss of hydrocephalus. No extra-axial fluid collection. Pituitary gland and suprasellar region within normal limits. There is an apparent 6 mm focus of enhancement involving the right pons (series 17, image 14). This is at the location of an underlying right pontine infarct as well as a few prominent chronic micro hemorrhages. Given this, finding is suspected to reflect enhancement related to a small of all vein subacute infarct or possibly artifact related to the adjacent microhemorrhage. No other abnormal enhancement elsewhere within the brain. Vascular: Major intracranial arterial vascular flow voids are maintained. Previously identified dural venous sinus thrombosis involving the right transverse and sigmoid sinuses with extension into the right jugular bulb and visualized proximal right internal jugular vein, corresponding with findings on recent exams. Overall, appearance is relatively similar. Skull and upper cervical spine: Craniocervical junction within normal  limits. Heterogeneous and mildly decreased T1 signal intensity within the visualized bone marrow, nonspecific, but most commonly related to anemia, smoking or  obesity. No scalp soft tissue abnormality. Sinuses/Orbits: Globes orbital soft tissues within normal limits. Mild chronic mucosal thickening noted about the paranasal sinuses. Moderate to large right mastoid and middle ear effusion. Trace left mastoid effusion noted. Other: None. IMPRESSION: 1. 6 mm acute ischemic nonhemorrhagic infarct involving the right splenium. 2. 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 Overall, appearance is relatively stable from prior. 3. 6 mm focus of enhancement involving the right pons, favored to reflect enhancement related to a small subacute infarct or possibly artifact related to the adjacent microhemorrhage. Short interval follow-up MRI in 3 months suggested to ensure resolution and/or stability. 4. Underlying age-related cerebral atrophy with moderately advanced chronic microvascular ischemic disease, with a few scattered remote lacunar infarcts involving the bilateral basal ganglia, right pons, and bilateral cerebellar hemispheres. 5. Multiple scattered chronic micro hemorrhages, likely hypertensive in nature. Electronically Signed   By: Morene Hoard M.D.   On: 10/19/2023 23:23   CT VENOGRAM HEAD Result Date: 10/19/2023 CLINICAL DATA:  58 year old female status post fall. Pain. Prior stroke. Left side weakness and numbness. History of lung cancer. Head and neck CT CT with contrast yesterday demonstrating central venous thrombosis, right IJ thrombosis involving the right transverse and sigmoid sinus also. EXAM: CT VENOGRAM HEAD TECHNIQUE: Venographic phase images of the brain were obtained following the administration of intravenous contrast. Multiplanar reformats and maximum intensity projections were generated. RADIATION DOSE REDUCTION: This exam was performed according to the departmental dose-optimization program which includes  automated exposure control, adjustment of the mA and/or kV according to patient size and/or use of iterative reconstruction technique. CONTRAST:  75mL OMNIPAQUE  IOHEXOL  300 MG/ML  SOLN COMPARISON:  Head and neck CT yesterday.  Brain MRI 11/09/2021. FINDINGS: CT HEAD Post-contrast only. Stable cerebral volume. Chronic lacunar infarcts in the bilateral brain. No evidence of intracranial mass effect, acute ventriculomegaly, acute intracranial hemorrhage, acute cerebral edema. CTV HEAD Venous sinuses: Maintained enhancement and patency of the superior sagittal sinus, torcula, straight sinus, inferior sagittal sinus, internal cerebral veins. Maintained enhancement and patency of the left transverse, left sigmoid venous sinus, and visible left IJ bulb. Cavernous sinus enhancement appears symmetric and within normal limits also. Nonenhancing, thrombosed right IJ bulb, right sigmoid venous sinus, and distal half of the right transverse sinus (beginning on series 2, image 25. The medial right transverse sinus remains enhancing. The appearance is unchanged from head and neck CT yesterday. Anatomic variants: None significant. Other findings: Calcified atherosclerosis at the skull base. Review of the MIP images confirms the above findings IMPRESSION: 1. Stable Thrombosis of the right IJ, right sigmoid sinus, and lateral half of the right transverse dural venous sinus since the CT Head and Neck yesterday. 2. No other intracranial venous thrombosis identified. No intracranial mass effect, hemorrhage, or acute cerebral edema identified. Electronically Signed   By: VEAR Hurst M.D.   On: 10/19/2023 08:20   CT Soft Tissue Neck W Contrast Result Date: 10/18/2023 CLINICAL DATA:  Initial evaluation for acute facial swelling. EXAM: CT NECK WITH CONTRAST TECHNIQUE: Multidetector CT imaging of the neck was performed using the standard protocol following the bolus administration of intravenous contrast. RADIATION DOSE REDUCTION: This exam  was performed according to the departmental dose-optimization program which includes automated exposure control,  adjustment of the mA and/or kV according to patient size and/or use of iterative reconstruction technique. CONTRAST:  OMNIPAQUE  IOHEXOL  350 MG/ML SOLN COMPARISON:  None Available. FINDINGS: Pharynx and larynx: Oral cavity within normal limits. Oropharynx and nasopharynx within normal limits. No retropharyngeal collection or swelling. Negative epiglottis. Hypopharynx and supraglottic larynx within normal limits. Negative glottis. Subglottic airway clear. Salivary glands: Salivary glands including the parotid and submandibular glands are within normal limits. Thyroid : Normal. Lymph nodes: No enlarged or pathologic lymph nodes within the neck. Vascular: Occlusive thrombus seen involving the brachiocephalic vein, extending into the visualized SVC. The vessels are expanded with hazy inflammatory stranding, consistent with acute thrombus. Clot extends to partially involve the right greater than left subclavian veins as well as the right axillary vein. Cephalad extension with subocclusive thrombus extending throughout the right internal jugular vein, with extension into the cranial vault to involve the right transverse and sigmoid sinuses. Left IJ remains patent. Multiple prominent venous collaterals noted within the right neck and visualized right chest wall. Normal arterial enhancement seen throughout the neck. Aortic atherosclerosis noted. Limited intracranial: No acute finding. Visualized orbits: No acute finding. Mastoids and visualized paranasal sinuses: Mild mucoperiosteal thickening about the ethmoidal air cells and maxillary sinuses. Right mastoid and middle ear effusion. Left mastoid air cells are clear. Skeleton: No worrisome osseous lesions. Upper chest: Approximate 5 cm thick walled cavitary mass at the posterior right upper lobe, partially visualize, better evaluated on concomitant CT of the  chest. Other: None. IMPRESSION: 1. Acute occlusive and subocclusive thrombus involving the brachiocephalic vein, extending into the visualized SVC. Clot extends to partially involve the right greater than left subclavian veins as well as the right axillary vein. Cephalad extension with subocclusive thrombus extending throughout the right internal jugular vein, with extension into the cranial vault to involve the right transverse and sigmoid sinuses. 2. Approximate 5 cm thick walled cavitary mass at the posterior right upper lobe, partially visualized, better evaluated on concomitant CT of the chest. 3. Right mastoid and middle ear effusion, of uncertain significance. Correlation with physical exam recommended. 4.  Aortic Atherosclerosis (ICD10-I70.0). Electronically Signed   By: Morene Hoard M.D.   On: 10/18/2023 19:21   CT Head W or Wo Contrast Result Date: 10/18/2023 CLINICAL DATA:  Initial evaluation for acute headache, history of malignancy. EXAM: CT HEAD WITHOUT AND WITH CONTRAST TECHNIQUE: Contiguous axial images were obtained from the base of the skull through the vertex without and with intravenous contrast. RADIATION DOSE REDUCTION: This exam was performed according to the departmental dose-optimization program which includes automated exposure control, adjustment of the mA and/or kV according to patient size and/or use of iterative reconstruction technique. CONTRAST:  OMNIPAQUE  IOHEXOL  350 MG/ML SOLN COMPARISON:  Prior CT from 09/20/2023 FINDINGS: Brain: Generalized age-related cerebral atrophy. Patchy and confluent hypodensity involving the supratentorial cerebral white matter, consistent with chronic small vessel ischemic disease, moderate in nature. Multiple scattered remote lacunar infarcts noted about the bilateral basal ganglia right thalamus, stable. No acute intracranial hemorrhage. No acute large vessel territory infarct. No mass lesion or midline shift. No hydrocephalus or  extra-axial fluid collection. Vascular: No abnormal hyperdense vessel seen prior to contrast administration. Calcified atherosclerosis present at the skull base. Following contrast administration, filling defect within the right transverse and sigmoid sinuses extending into the right jugular bulb and visualized proximal right internal jugular vein, consistent with dural venous sinus thrombosis. This appears to extend inferiorly throughout the right IJ within the neck. Skull: Scalp soft  tissues demonstrate no acute finding. Calvarium intact. Sinuses/Orbits: Globes orbital soft tissues within normal limits. Mild mucoperiosteal thickening present about the paranasal sinuses. Moderate right mastoid and middle ear effusion. Left mastoid air cells are clear. Other: None. IMPRESSION: 1. Filling defect within the right transverse and sigmoid sinuses extending into the right jugular bulb and visualized proximal right internal jugular vein, consistent with dural venous sinus thrombosis. This extends inferiorly throughout the right IJ within the neck. 2. No other acute intracranial abnormality. 3. Moderate right mastoid and middle ear effusion, of uncertain significance. Correlation with physical exam recommended. 4. Underlying age-related cerebral atrophy with moderate chronic small vessel ischemic disease, with multiple remote lacunar infarcts about the bilateral basal ganglia and right thalamus. Electronically Signed   By: Morene Hoard M.D.   On: 10/18/2023 19:11   CT Angio Chest PE W and/or Wo Contrast Addendum Date: 10/18/2023 ADDENDUM REPORT: 10/18/2023 18:55 ADDENDUM: Results were discussed with Dr. Dreama at 6:41 p.m. [place] on October 18, 2023. Electronically Signed   By: Suzen Dials M.D.   On: 10/18/2023 18:55   Result Date: 10/18/2023 CLINICAL DATA:  History of lung cancer presenting with chest tightness. EXAM: CT ANGIOGRAPHY CHEST WITH CONTRAST TECHNIQUE: Multidetector CT imaging of the chest  was performed using the standard protocol during bolus administration of intravenous contrast. Multiplanar CT image reconstructions and MIPs were obtained to evaluate the vascular anatomy. RADIATION DOSE REDUCTION: This exam was performed according to the departmental dose-optimization program which includes automated exposure control, adjustment of the mA and/or kV according to patient size and/or use of iterative reconstruction technique. CONTRAST:  OMNIPAQUE  IOHEXOL  350 MG/ML SOLN COMPARISON:  October 14, 2023 FINDINGS: Cardiovascular: There is marked severity calcification of the aortic arch without evidence of aortic aneurysm or dissection. The segmental and subsegmental pulmonary arteries are limited in evaluation secondary to suboptimal opacification with intravenous contrast. As result, pulmonary embolism within the segmental branches of the right lower lobe cannot be excluded (axial CT images 132 through 142, CT series 6). Occlusive thrombus is noted within the brachiocephalic vein. This is present on the prior study. Nonocclusive thrombus is seen within the superior vena cava (axial CT images 39 through 45, CT series 5). Nonocclusive thrombus is also noted within the azygous vein (axial CT images 44 through 78, CT series 5). A partially thrombosed venous structure is seen within the anteromedial aspect of the upper right lung (axial CT image 44 through 53, CT series 5). Extensive, nonocclusive thrombus is noted within the right axillary vein and right subclavian vein (axial CT images 5 through 52, CT series 5) Numerous thin, tortuous, enhancing venous structures are seen within the anterior and posterior aspects of the chest wall on the right. Normal heart size with mild to moderate severity coronary artery calcification and marked severity calcification of the mitral annulus. No pericardial effusion. Mediastinum/Nodes: There is mild right hilar lymphadenopathy. Thyroid  gland, trachea, and esophagus  demonstrate no significant findings. Lungs/Pleura: A 4.4 cm x 4.8 cm x 4.6 cm thick walled cavitary lesion is again seen within the posterior aspect of the right upper lobe. Mild anteromedial right middle lobe linear atelectasis is noted. No pleural effusion or pneumothorax identified. Upper Abdomen: There is a small hiatal hernia. Diffuse fatty infiltration of the liver parenchyma is noted. Noninflamed diverticula are seen throughout the visualized portions of the large bowel. Musculoskeletal: No chest wall abnormality. No acute or significant osseous findings. Review of the MIP images confirms the above findings. IMPRESSION: 1. Limited evaluation  of the segmental and subsegmental pulmonary arteries secondary to suboptimal opacification with intravenous contrast. As result, pulmonary embolism within the segmental branches of the right lower lobe cannot be excluded. 2. Occlusive thrombus within the brachiocephalic vein, nonocclusive thrombus within the superior vena cava and azygous vein, and extensive, nonocclusive thrombus within the right axillary vein and right subclavian vein. 3. 4.4 cm x 4.8 cm x 4.6 cm thick walled cavitary lesion within the posterior aspect of the right upper lobe which is seen on the prior study and may be consistent with the patient's history of lung cancer. 4. Mild right hilar lymphadenopathy. 5. Small hiatal hernia. 6. Hepatic steatosis. 7. Colonic diverticulosis. 8. Aortic atherosclerosis. Electronically Signed: By: Suzen Dials M.D. On: 10/18/2023 18:52   DG Chest 2 View Result Date: 10/18/2023 CLINICAL DATA:  Chest pain EXAM: CHEST - 2 VIEW COMPARISON:  Chest radiograph October 14, 2023, CT angio chest October 14, 2023 FINDINGS: Thick-walled cavitary lesion with air-fluid level in right upper lobe, measuring approximately 5 cm. No new lesion or consolidation identified. Cardiomediastinal silhouette is enlarged. Fullness of right paratracheal correlating to mediastinal  lymphadenopathy. No pleural effusion or pneumothorax. Multilevel degenerative changes of the spine. Nodular opacity superimposing lower thoracic spine on lateral view correlates to osteophytes better assessed on recent CT angio chest. IMPRESSION: Thick-walled cavitary lesion of right upper lobe. Right lower paratracheal mediastinal lymphadenopathy. Electronically Signed   By: Megan  Zare M.D.   On: 10/18/2023 14:34   CT Angio Chest PE W and/or Wo Contrast Result Date: 10/14/2023 CLINICAL DATA:  Increased shortness of breath, history of lung cancer in 2008 normal pulmonary embolism suspected, positive D-dimer EXAM: CT ANGIOGRAPHY CHEST WITH CONTRAST TECHNIQUE: Multidetector CT imaging of the chest was performed using the standard protocol during bolus administration of intravenous contrast. Multiplanar CT image reconstructions and MIPs were obtained to evaluate the vascular anatomy. RADIATION DOSE REDUCTION: This exam was performed according to the departmental dose-optimization program which includes automated exposure control, adjustment of the mA and/or kV according to patient size and/or use of iterative reconstruction technique. CONTRAST:  75mL OMNIPAQUE  IOHEXOL  350 MG/ML SOLN COMPARISON:  Same day chest radiograph, November 09, 2021 CT FINDINGS: Cardiovascular: Satisfactory opacification of the pulmonary arteries to the segmental level. No evidence of pulmonary embolism. No pericardial effusion. Nonspecific narrowing of left brachiocephalic vein and upper SVC due to mediastinal lymphadenopathy with venous reflux into the left chest wall collaterals. Mediastinum/Nodes: Bulky right paratracheal lymphadenopathy, measuring up to 3.1 x 2.5 cm there are also some mildly prominent, subcentimeter bilateral supraclavicular lymph nodes. Lungs/Pleura: Thick-walled, cavitary right upper lobe mass measuring approximately 5.7 x 5.3 cm. There was a small pulmonary nodule at this location on prior CT performed November 09, 2021.  No pleural effusions. No pneumothorax. Upper Abdomen: No acute findings. Musculoskeletal: No acute osseous findings. Review of the MIP images confirms the above findings. IMPRESSION: 1. Cavitary right upper lobe mass and bulky mediastinal lymphadenopathy, concerning for primary lung malignancy. Tissue biopsy may be helpful to confirm diagnosis and exclude possible mycobacterial infection. 2. Nonspecific narrowing of upper SVC and brachiocephalic veins due to mediastinal lymphadenopathy. 3. Nonspecific, mildly prominent subcentimeter bilateral supraclavicular lymph nodes, felt to be reactive related to the central venous stenosis, but difficult to exclude metastatic disease. Electronically Signed   By: Michaeline Blanch M.D.   On: 10/14/2023 12:49   DG Chest 2 View Result Date: 10/14/2023 CLINICAL DATA:  SOB EXAM: CHEST - 2 VIEW COMPARISON:  January 29, 2022 FINDINGS: Thick-walled cavity in  the right upper lobe, measuring 5.5 cm. No lobar consolidation, pleural effusion, or pneumothorax. Fullness of the right suprahilar region. No cardiomegaly. Aortic atherosclerosis. No acute fracture or destructive lesions. Multilevel thoracic osteophytosis. IMPRESSION: 1. Thick-walled cavity in the right upper lobe, measuring 5.5 cm. This may represent either TB or neoplasm. Appropriate isolation precautions. 2. Fullness of the right suprahilar region is also present, worrisome for lymphadenopathy. A follow-up chest CT with IV contrast recommended for further characterization. Critical Value/emergent results were called by telephone at the time of interpretation on 10/14/2023 at 8:36 am to provider ANDREW TEE , who verbally acknowledged these results. Electronically Signed   By: Rogelia Myers M.D.   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  He was found to have a  thick-walled cavitary lesion in the right upper lobe, mediastinal, and hilar lymphadenopathy.  She has non-specific prominent subcentimeter bilateral supraclavicular lymph nodes. She was diagnosed in September 2025.  She does have a history of small cell lung cancer in 2007 treated with chemo and radiation and PCI at [facility].  Will request PD-L1 expression.  The patient was seen by Dr. Sherrod today.  Dr. Sherrod recommends that she continue the staging workup with a PET scan.  I have placed the order.  Dr. Sherrod explained that this is likely stage III lung cancer.  Dr. Sherrod would recommend treatment with concurrent chemoradiation with weekly carboplatin for an AUC of 2 and paclitaxel 45 mg/m.  The patient is interested in this option and she is expected to undergo the first cycle of treatment on 11/15/2023.  The adverse side effects of treatment were discussed including but not limited to alopecia, myelosuppression, fatigue, nausea, vomiting, kidney, liver dysfunction.  I have placed a referral to radiation oncology.  I sent a prescription for 10 mg of Compazine  every 6 hours as needed for nausea and vomiting.  I discussed with the patient that she will need to continue on Eliquis  lifelong due to hypercoagulable state.  Should she develop any signs of worsening PE or stroke symptoms she would need to return immediately to the emergency room.  She is scheduled to follow with pulmonary medicine next week and her PCP as well  I will arrange for follow-up visit in 2 before undergoing cycle #1.  He was strongly encouraged to continue to quit smoking.  We will check iron, ferritin, and folate labs due to her baseline anemia.  Does not sound like the patient is currently following with a gastroenterologist.  She had seen someone at [facility] but reports that she got frustrated with them and has not been back.  Encouraged her to follow-up with cardiology per hospital instructions  reportedly.  The patient was advised to call immediately if she has any concerning symptoms in the interval. The patient voices understanding of current disease status and treatment options and is in agreement with the current care plan. All questions were answered. The patient knows to call the clinic with any problems, questions or concerns. We can certainly see the patient much sooner if necessary  The patient voices understanding of current disease status and treatment options and is in agreement with the current care plan.  All questions were answered. The patient knows to call the clinic with any problems, questions or concerns. We can certainly see the patient much sooner if necessary.  Thank you so much for allowing me to participate in the care of CARLETHA DAWN. I will continue to follow up the patient with you and assist in her care.  Disclaimer: This note was dictated with voice recognition software. Similar sounding words can inadvertently be transcribed and may not be corrected upon review.   Micaiah Litle L Rola Lennon November 02, 2023, 12:49 PM  ADDENDUM: Hematology/Oncology Attending: I had a face-to-face encounter with the patient today.  I reviewed her records, lab, scan and recommended her care plan.  This is a 58 years old African-American female with past medical history significant for multiple comorbidities including history of right lower lobe small cell lung cancer treated at [facility] in 2007 with a course of concurrent chemoradiation followed by prophylactic cranial irradiation.  She also has a history of a stroke, NSTEMI, hypertension, aortic stenosis, superior vena cava thrombus as well as dyslipidemia for breast-feeding and polysubstance abuse including tobacco abuse.  The patient was seen recently in the hospital on 10/18/2023 complaining of shortness of breath and chest pain.  She was found at that time to have head and neck thrombus with dural sinus  thrombosis and acute CVA.  She is currently on Eliquis  for this condition and managed by neurology and her primary care physician.  During her evaluation she had CT angiogram that incidentally showed 4.4 x 4.8 x 4.6 cm thick-walled cavitary lesion in the posterior right upper lobe with mild right hilar lymphadenopathy.  Her previous CT angiogram on 10/14/2023 showed the same mass measuring 5.7 x 5.3 cm.  There was also suspicious prominent subcentimeter bilateral supraclavicular lymph nodes on that scan.  During her hospitalization she was seen by pulmonary medicine and she underwent bronchoscopy by Dr. Jude on 10/22/2023 and the final pathology from the 4R lymph node was consistent with non-small cell lung cancer, squamous cell carcinoma. The patient continues to complain of fatigue and weakness and sleeping most of the time.  She has a long history of smoking and would like to continue to smoke but her daughter has been working on helping her on smoking cessation. I had a lengthy discussion with the patient and her daughter today about her current condition and treatment options.  The patient has stage IIIb (T3, N2, M0) non-small cell lung cancer, squamous cell carcinoma presented with cavitary right upper lobe lung mass in addition to mediastinal lymphadenopathy and there is concern also about possibility of bilateral supraclavicular lymph nodes pending a PET scan for staging workup. I recommended for the patient to have the PET scan to complete the staging workup. I recommended for her course of concurrent chemoradiation with weekly carboplatin for AUC of 2 and paclitaxel 45 MGs/M2.  She is expected to start the first dose of this treatment on November 15, 2023. I discussed with the patient the adverse effect of this treatment including but not limited to alopecia myelosuppression, nausea and vomiting, peripheral neuropathy, liver or renal dysfunction.  We will arrange for the patient to have a chemotherapy  education class before the first dose of her treatment. Will also refer her to radiation oncology for discussion of the radiotherapy portion of this treatment.  She is also expected to receive consolidation treatment with immunotherapy with durvalumab after completion of the induction phase if she has no evidence for disease progression. Will also arrange for the patient to have Port-A-Cath placed for the treatment. She will come back for follow-up visit with the first dose of her treatment. The patient  was advised to call immediately if she has any concerning symptoms in the interval. The total time spent in the appointment was 90 minutes including review of chart and various tests results, discussions about plan of care and coordination of care plan . Disclaimer: This note was dictated with voice recognition software. Similar sounding words can inadvertently be transcribed and may be missed upon review. Sherrod MARLA Sherrod, MD

## 2023-11-02 NOTE — Telephone Encounter (Signed)
 I contacted [name] to inform her that we can get Sabrin in on tomorrow morning at 8am. [name] is aware to arrive early for check in and she is aware of our location. All questions were answered.

## 2023-11-03 ENCOUNTER — Inpatient Hospital Stay: Attending: Physician Assistant

## 2023-11-03 ENCOUNTER — Inpatient Hospital Stay (HOSPITAL_BASED_OUTPATIENT_CLINIC_OR_DEPARTMENT_OTHER): Admitting: Physician Assistant

## 2023-11-03 VITALS — BP 116/77 | HR 116 | Temp 98.0°F | Resp 20 | Wt 217.7 lb

## 2023-11-03 DIAGNOSIS — Z9221 Personal history of antineoplastic chemotherapy: Secondary | ICD-10-CM

## 2023-11-03 DIAGNOSIS — Z7189 Other specified counseling: Secondary | ICD-10-CM | POA: Insufficient documentation

## 2023-11-03 DIAGNOSIS — R590 Localized enlarged lymph nodes: Secondary | ICD-10-CM | POA: Insufficient documentation

## 2023-11-03 DIAGNOSIS — F17210 Nicotine dependence, cigarettes, uncomplicated: Secondary | ICD-10-CM | POA: Insufficient documentation

## 2023-11-03 DIAGNOSIS — Z79899 Other long term (current) drug therapy: Secondary | ICD-10-CM

## 2023-11-03 DIAGNOSIS — Z923 Personal history of irradiation: Secondary | ICD-10-CM

## 2023-11-03 DIAGNOSIS — C3411 Malignant neoplasm of upper lobe, right bronchus or lung: Secondary | ICD-10-CM

## 2023-11-03 DIAGNOSIS — Z85118 Personal history of other malignant neoplasm of bronchus and lung: Secondary | ICD-10-CM | POA: Insufficient documentation

## 2023-11-03 DIAGNOSIS — J984 Other disorders of lung: Secondary | ICD-10-CM

## 2023-11-03 DIAGNOSIS — C3490 Malignant neoplasm of unspecified part of unspecified bronchus or lung: Secondary | ICD-10-CM | POA: Insufficient documentation

## 2023-11-03 DIAGNOSIS — C3491 Malignant neoplasm of unspecified part of right bronchus or lung: Secondary | ICD-10-CM

## 2023-11-03 LAB — CMP (CANCER CENTER ONLY)
ALT: 15 U/L (ref 0–44)
AST: 14 U/L — ABNORMAL LOW (ref 15–41)
Albumin: 3.8 g/dL (ref 3.5–5.0)
Alkaline Phosphatase: 52 U/L (ref 38–126)
Anion gap: 3 — ABNORMAL LOW (ref 5–15)
BUN: 11 mg/dL (ref 6–20)
CO2: 31 mmol/L (ref 22–32)
Calcium: 9.4 mg/dL (ref 8.9–10.3)
Chloride: 105 mmol/L (ref 98–111)
Creatinine: 0.68 mg/dL (ref 0.44–1.00)
GFR, Estimated: >60 mL/min (ref >60)
Glucose, Bld: 120 mg/dL — ABNORMAL HIGH (ref 70–99)
Potassium: 3.8 mmol/L (ref 3.5–5.1)
Sodium: 139 mmol/L (ref 135–145)
Total Bilirubin: 0.5 mg/dL (ref 0.0–1.2)
Total Protein: 7.7 g/dL (ref 6.5–8.1)

## 2023-11-03 LAB — CBC WITH DIFFERENTIAL (CANCER CENTER ONLY)
Abs Immature Granulocytes: 0.02 K/uL (ref 0.00–0.07)
Basophils Absolute: 0 K/uL (ref 0.0–0.1)
Basophils Relative: 0 %
Eosinophils Absolute: 0.1 K/uL (ref 0.0–0.5)
Eosinophils Relative: 1 %
HCT: 33 % — ABNORMAL LOW (ref 36.0–46.0)
Hemoglobin: 10.3 g/dL — ABNORMAL LOW (ref 12.0–15.0)
Immature Granulocytes: 0 %
Lymphocytes Relative: 11 %
Lymphs Abs: 0.7 K/uL (ref 0.7–4.0)
MCH: 27.8 pg (ref 26.0–34.0)
MCHC: 31.2 g/dL (ref 30.0–36.0)
MCV: 88.9 fL (ref 80.0–100.0)
Monocytes Absolute: 0.4 K/uL (ref 0.1–1.0)
Monocytes Relative: 8 %
Neutro Abs: 4.6 K/uL (ref 1.7–7.7)
Neutrophils Relative %: 80 %
Platelet Count: 218 K/uL (ref 150–400)
RBC: 3.71 MIL/uL — ABNORMAL LOW (ref 3.87–5.11)
RDW: 15.8 % — ABNORMAL HIGH (ref 11.5–15.5)
WBC Count: 5.8 K/uL (ref 4.0–10.5)
nRBC: 0 % (ref 0.0–0.2)

## 2023-11-03 LAB — IRON AND IRON BINDING CAPACITY (CC-WL,HP ONLY)
Iron: 37 ug/dL (ref 28–170)
Saturation Ratios: 14 % (ref 10.4–31.8)
TIBC: 269 ug/dL (ref 250–450)
UIBC: 232 ug/dL (ref 148–442)

## 2023-11-03 LAB — FOLATE: Folate: 8.2 ng/mL (ref >5.9)

## 2023-11-03 LAB — VITAMIN B12: Vitamin B-12: 604 pg/mL (ref 180–914)

## 2023-11-03 LAB — FERRITIN: Ferritin: 249 ng/mL (ref 11–307)

## 2023-11-03 MED ORDER — PROCHLORPERAZINE MALEATE 10 MG PO TABS: 10.0000 mg | ORAL_TABLET | Freq: Four times a day (QID) | ORAL | 2 refills | Status: DC | PRN

## 2023-11-03 MED ORDER — LIDOCAINE-PRILOCAINE 2.5-2.5 % EX CREA: TOPICAL_CREAM | CUTANEOUS | 3 refills | Status: AC

## 2023-11-03 MED ORDER — PROCHLORPERAZINE MALEATE 10 MG PO TABS: 10.0000 mg | ORAL_TABLET | Freq: Four times a day (QID) | ORAL | 1 refills | Status: DC | PRN

## 2023-11-03 MED ORDER — ONDANSETRON HCL 8 MG PO TABS: 8.0000 mg | ORAL_TABLET | Freq: Three times a day (TID) | ORAL | 1 refills | Status: DC | PRN

## 2023-11-03 NOTE — Patient Instructions (Signed)
-  There are two main categories of lung cancer, they are named based on the size of the cancer cell. One is called Non-Small cell lung cancer. 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  -We covered a lot of important information at your appointment today regarding what the treatment plan is moving forward. Here are the the main points that were discussed at your office visit with us  today:  -The treatment that you will receive consists of two chemotherapy drugs called Carboplatin and Paclitaxel (also called Taxol) -We are planning on starting your treatment next week on 11/15/23 but before your start your treatment, I would like you to attend a Chemotherapy Education Class. This involves having you sit down with one of our nurse educators. She will discuss with you one-on-one more details about your treatment as well as general information about resources here at the [facility].  -Your treatment will be given every week for about 6 weeks or so (as long as you are also receiving radiation). We will check your labs (blood work) once a week . Dr. Sherrod or I will see you every other treatment just to make sure that you are doing well and that everything is on track. -We will get a CT scan about 3 weeks after you complete your treatment  Medications:  -Compazine  was sent to your pharmacy. This medication is for nausea. You may take this every 6 hours as needed if you feel nausous.   Referrals -I will place the referral to radiation oncology to meet with you to discuss starting radiation treatment. Please be on the lookout for a phone call from them to schedule a consultation. -Please get a PET scan, they will call you to schedule.    Follow Up:  -We will see you back for a follow up visit in __ weeks

## 2023-11-03 NOTE — Progress Notes (Signed)
 START ON PATHWAY REGIMEN - Non-Small Cell Lung     A cycle is every 7 days, concurrent with RT:     Paclitaxel      Carboplatin   **Always confirm dose/schedule in your pharmacy ordering system**  Patient Characteristics: Preoperative or Nonsurgical Candidate (Clinical Staging), Stage IIB (N2a only) or Stage III - Nonsurgical Candidate, PS = 0,1 Therapeutic Status: Preoperative or Nonsurgical Candidate (Clinical Staging) AJCC T Category: cT3 AJCC N Category: cN2b AJCC M Category: cM0 AJCC 9 Stage Grouping: IIIB Check here if patient was staged using an edition other than AJCC Staging 9th Edition: false ECOG Performance Status: 1 Intent of Therapy: Curative Intent, Discussed with Patient

## 2023-11-04 ENCOUNTER — Other Ambulatory Visit: Payer: Self-pay | Admitting: Physician Assistant

## 2023-11-04 ENCOUNTER — Other Ambulatory Visit: Payer: Self-pay

## 2023-11-04 DIAGNOSIS — C3490 Malignant neoplasm of unspecified part of unspecified bronchus or lung: Secondary | ICD-10-CM

## 2023-11-05 ENCOUNTER — Telehealth: Payer: Self-pay | Admitting: Internal Medicine

## 2023-11-05 NOTE — Telephone Encounter (Signed)
 Attempted to contact the patient twice but there was no voicemail set up. The patient is active on MyChart.

## 2023-11-06 ENCOUNTER — Encounter: Payer: Self-pay | Admitting: Internal Medicine

## 2023-11-06 NOTE — Progress Notes (Signed)
 The proposed treatment discussed in conference is for discussion purpose only and is not a binding recommendation.  The patients have not been physically examined, or presented with their treatment options.  Therefore, final treatment plans cannot be decided.

## 2023-11-07 ENCOUNTER — Other Ambulatory Visit: Payer: Self-pay

## 2023-11-08 ENCOUNTER — Encounter (HOSPITAL_COMMUNITY): Payer: Self-pay | Admitting: Internal Medicine

## 2023-11-08 NOTE — Progress Notes (Addendum)
 Pharmacist Chemotherapy Monitoring - Initial Assessment    Anticipated start date: 11/15/23   The following has been reviewed per standard work regarding the patient's treatment regimen: The patient's diagnosis, treatment plan and drug doses, and organ/hematologic function Lab orders and baseline tests specific to treatment regimen  The treatment plan start date, drug sequencing, and pre-medications Prior authorization status  Patient's documented medication list, including drug-drug interaction screen and prescriptions for anti-emetics and supportive care specific to the treatment regimen The drug concentrations, fluid compatibility, administration routes, and timing of the medications to be used The patient's access for treatment and lifetime cumulative dose history, if applicable  The patient's medication allergies and previous infusion related reactions, if applicable   Changes made to treatment plan:  N/A  Follow up needed:  patient's allergy to prednisone  w/rxn anaphylaxis as pt has dexamethasone ordered as premed (SM sent to MD and PA) and port placement  Addendum: Just remove dexamethasone and see how she handles it per Dr. Sherrod. Dexamethasone premed deleted from tx plan.  Zanna Hawn, PharmD, MBA

## 2023-11-09 ENCOUNTER — Ambulatory Visit: Admitting: Acute Care

## 2023-11-10 ENCOUNTER — Encounter: Payer: Self-pay | Admitting: Internal Medicine

## 2023-11-10 ENCOUNTER — Ambulatory Visit: Payer: 59 | Admitting: Podiatry

## 2023-11-10 ENCOUNTER — Inpatient Hospital Stay

## 2023-11-10 ENCOUNTER — Encounter (HOSPITAL_COMMUNITY): Payer: Self-pay | Admitting: Internal Medicine

## 2023-11-10 NOTE — Progress Notes (Incomplete)
 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 Smoked for about 30 years averaging 1 pack of cigarettes per day. Used to be a heavy drinker but does not drink anymore  10/24/2023 DG Chest   Past/Anticipated interventions by medical oncology, if any:  11/03/2023 [place], PA   Signs/Symptoms Weight changes, if any: {:18581} Respiratory complaints, if any: {:18581} Hemoptysis, if any: {:18581} Pain issues, if any:  {:18581}  SAFETY ISSUES: Prior radiation? {:18581} Pacemaker/ICD? {:18581}  Possible current pregnancy?{:18581} Is the patient on methotrexate? {:18581}  Current Complaints / other details:  ***

## 2023-11-11 ENCOUNTER — Inpatient Hospital Stay (HOSPITAL_BASED_OUTPATIENT_CLINIC_OR_DEPARTMENT_OTHER)
Admission: EM | Admit: 2023-11-11 | Discharge: 2023-11-18 | DRG: 180 | Disposition: A | Discharge status: Skilled Nursing Facility | Attending: Internal Medicine | Admitting: Internal Medicine

## 2023-11-11 ENCOUNTER — Other Ambulatory Visit: Payer: Self-pay

## 2023-11-11 ENCOUNTER — Inpatient Hospital Stay: Attending: Internal Medicine

## 2023-11-11 ENCOUNTER — Emergency Department (HOSPITAL_BASED_OUTPATIENT_CLINIC_OR_DEPARTMENT_OTHER)

## 2023-11-11 ENCOUNTER — Ambulatory Visit: Attending: Internal Medicine | Admitting: Physical Therapy

## 2023-11-11 ENCOUNTER — Encounter (HOSPITAL_BASED_OUTPATIENT_CLINIC_OR_DEPARTMENT_OTHER): Payer: Self-pay

## 2023-11-11 DIAGNOSIS — G08 Intracranial and intraspinal phlebitis and thrombophlebitis: Secondary | ICD-10-CM

## 2023-11-11 DIAGNOSIS — Z923 Personal history of irradiation: Secondary | ICD-10-CM

## 2023-11-11 DIAGNOSIS — D72819 Decreased white blood cell count, unspecified: Secondary | ICD-10-CM | POA: Diagnosis present

## 2023-11-11 DIAGNOSIS — Z7901 Long term (current) use of anticoagulants: Secondary | ICD-10-CM

## 2023-11-11 DIAGNOSIS — Z7409 Other reduced mobility: Secondary | ICD-10-CM | POA: Diagnosis present

## 2023-11-11 DIAGNOSIS — J984 Other disorders of lung: Secondary | ICD-10-CM

## 2023-11-11 DIAGNOSIS — I82A11 Acute embolism and thrombosis of right axillary vein: Secondary | ICD-10-CM | POA: Diagnosis present

## 2023-11-11 DIAGNOSIS — Z79899 Other long term (current) drug therapy: Secondary | ICD-10-CM

## 2023-11-11 DIAGNOSIS — F1411 Cocaine abuse, in remission: Secondary | ICD-10-CM | POA: Diagnosis present

## 2023-11-11 DIAGNOSIS — D638 Anemia in other chronic diseases classified elsewhere: Secondary | ICD-10-CM | POA: Diagnosis present

## 2023-11-11 DIAGNOSIS — J189 Pneumonia, unspecified organism: Secondary | ICD-10-CM | POA: Diagnosis present

## 2023-11-11 DIAGNOSIS — R531 Weakness: Principal | ICD-10-CM

## 2023-11-11 DIAGNOSIS — Z880 Allergy status to penicillin: Secondary | ICD-10-CM

## 2023-11-11 DIAGNOSIS — F17210 Nicotine dependence, cigarettes, uncomplicated: Secondary | ICD-10-CM | POA: Diagnosis present

## 2023-11-11 DIAGNOSIS — Z888 Allergy status to other drugs, medicaments and biological substances status: Secondary | ICD-10-CM

## 2023-11-11 DIAGNOSIS — Z832 Family history of diseases of the blood and blood-forming organs and certain disorders involving the immune mechanism: Secondary | ICD-10-CM

## 2023-11-11 DIAGNOSIS — J45909 Unspecified asthma, uncomplicated: Secondary | ICD-10-CM | POA: Diagnosis present

## 2023-11-11 DIAGNOSIS — F1910 Other psychoactive substance abuse, uncomplicated: Secondary | ICD-10-CM | POA: Diagnosis present

## 2023-11-11 DIAGNOSIS — D649 Anemia, unspecified: Secondary | ICD-10-CM

## 2023-11-11 DIAGNOSIS — I2510 Atherosclerotic heart disease of native coronary artery without angina pectoris: Secondary | ICD-10-CM | POA: Diagnosis present

## 2023-11-11 DIAGNOSIS — E871 Hypo-osmolality and hyponatremia: Secondary | ICD-10-CM | POA: Diagnosis present

## 2023-11-11 DIAGNOSIS — R296 Repeated falls: Secondary | ICD-10-CM | POA: Diagnosis present

## 2023-11-11 DIAGNOSIS — I10 Essential (primary) hypertension: Secondary | ICD-10-CM | POA: Diagnosis present

## 2023-11-11 DIAGNOSIS — E785 Hyperlipidemia, unspecified: Secondary | ICD-10-CM | POA: Diagnosis present

## 2023-11-11 DIAGNOSIS — Z886 Allergy status to analgesic agent status: Secondary | ICD-10-CM

## 2023-11-11 DIAGNOSIS — Z8673 Personal history of transient ischemic attack (TIA), and cerebral infarction without residual deficits: Secondary | ICD-10-CM

## 2023-11-11 DIAGNOSIS — I350 Nonrheumatic aortic (valve) stenosis: Secondary | ICD-10-CM | POA: Diagnosis present

## 2023-11-11 DIAGNOSIS — Z9221 Personal history of antineoplastic chemotherapy: Secondary | ICD-10-CM

## 2023-11-11 DIAGNOSIS — I82503 Chronic embolism and thrombosis of unspecified deep veins of lower extremity, bilateral: Secondary | ICD-10-CM | POA: Diagnosis present

## 2023-11-11 DIAGNOSIS — I82210 Acute embolism and thrombosis of superior vena cava: Principal | ICD-10-CM | POA: Diagnosis present

## 2023-11-11 DIAGNOSIS — C3411 Malignant neoplasm of upper lobe, right bronchus or lung: Principal | ICD-10-CM | POA: Diagnosis present

## 2023-11-11 DIAGNOSIS — C3490 Malignant neoplasm of unspecified part of unspecified bronchus or lung: Secondary | ICD-10-CM | POA: Diagnosis present

## 2023-11-11 DIAGNOSIS — H5462 Unqualified visual loss, left eye, normal vision right eye: Secondary | ICD-10-CM | POA: Diagnosis present

## 2023-11-11 LAB — CBC WITH DIFFERENTIAL/PLATELET
Abs Immature Granulocytes: 0.01 K/uL (ref 0.00–0.07)
Basophils Absolute: 0 K/uL (ref 0.0–0.1)
Basophils Relative: 0 %
Eosinophils Absolute: 0.1 K/uL (ref 0.0–0.5)
Eosinophils Relative: 2 %
HCT: 33.3 % — ABNORMAL LOW (ref 36.0–46.0)
Hemoglobin: 10.7 g/dL — ABNORMAL LOW (ref 12.0–15.0)
Immature Granulocytes: 0 %
Lymphocytes Relative: 13 %
Lymphs Abs: 0.6 K/uL — ABNORMAL LOW (ref 0.7–4.0)
MCH: 28.3 pg (ref 26.0–34.0)
MCHC: 32.1 g/dL (ref 30.0–36.0)
MCV: 88.1 fL (ref 80.0–100.0)
Monocytes Absolute: 0.4 K/uL (ref 0.1–1.0)
Monocytes Relative: 8 %
Neutro Abs: 3.5 K/uL (ref 1.7–7.7)
Neutrophils Relative %: 77 %
Platelets: 207 K/uL (ref 150–400)
RBC: 3.78 MIL/uL — ABNORMAL LOW (ref 3.87–5.11)
RDW: 14.9 % (ref 11.5–15.5)
WBC: 4.6 K/uL (ref 4.0–10.5)
nRBC: 0 % (ref 0.0–0.2)

## 2023-11-11 LAB — URINALYSIS, ROUTINE W REFLEX MICROSCOPIC
Bilirubin Urine: NEGATIVE
Glucose, UA: NEGATIVE mg/dL
Hgb urine dipstick: NEGATIVE
Ketones, ur: NEGATIVE mg/dL
Leukocytes,Ua: NEGATIVE
Nitrite: NEGATIVE
Specific Gravity, Urine: >1.046 — ABNORMAL HIGH (ref 1.005–1.030)
pH: 7 (ref 5.0–8.0)

## 2023-11-11 LAB — COMPREHENSIVE METABOLIC PANEL WITH GFR
ALT: 16 U/L (ref 0–44)
AST: 28 U/L (ref 15–41)
Albumin: 3.6 g/dL (ref 3.5–5.0)
Alkaline Phosphatase: 64 U/L (ref 38–126)
Anion gap: 11 (ref 5–15)
BUN: 8 mg/dL (ref 6–20)
CO2: 26 mmol/L (ref 22–32)
Calcium: 10.1 mg/dL (ref 8.9–10.3)
Chloride: 99 mmol/L (ref 98–111)
Creatinine, Ser: 0.73 mg/dL (ref 0.44–1.00)
GFR, Estimated: >60 mL/min (ref >60)
Glucose, Bld: 127 mg/dL — ABNORMAL HIGH (ref 70–99)
Potassium: 4.2 mmol/L (ref 3.5–5.1)
Sodium: 136 mmol/L (ref 135–145)
Total Bilirubin: 0.6 mg/dL (ref 0.0–1.2)
Total Protein: 8 g/dL (ref 6.5–8.1)

## 2023-11-11 MED ORDER — LACTATED RINGERS IV BOLUS
500.0000 mL | Freq: Once | INTRAVENOUS | Status: AC
  Administered 2023-11-11: 500 mL via INTRAVENOUS

## 2023-11-11 MED ORDER — SODIUM CHLORIDE 0.9 % IV SOLN
1.0000 g | Freq: Once | INTRAVENOUS | Status: AC
  Administered 2023-11-11: 1 g via INTRAVENOUS
  Filled 2023-11-11: qty 10

## 2023-11-11 MED ORDER — SODIUM CHLORIDE 0.9 % IV SOLN
500.0000 mg | Freq: Once | INTRAVENOUS | Status: AC
  Administered 2023-11-11: 500 mg via INTRAVENOUS
  Filled 2023-11-11: qty 5

## 2023-11-11 MED ORDER — IOHEXOL 350 MG/ML SOLN
100.0000 mL | Freq: Once | INTRAVENOUS | Status: AC | PRN
Start: 2023-11-11 — End: 2023-11-11
  Administered 2023-11-11: 100 mL via INTRAVENOUS

## 2023-11-11 NOTE — ED Provider Notes (Signed)
 [place] EMERGENCY DEPARTMENT AT [facility] Provider Note   CSN: [id number] Arrival date & time: 11/11/23  1711     Patient presents with: Andrea Mullen is a 58 y.o. female.  History of NSTEMI, CVA, lung cancer, anemia, hypertension, hyperlipidemia.  Presents the ER today complaining of left leg swelling.  Family states she fell twice once on September 23 and wants 2 days ago falling on carpet but did strike her head and is complaining of left-sided neck pain since then.  Patient is noted that her legs been swollen but has not had any pain in the leg, family states that she has been having a lot of difficulty recently ambulating.  They state this is not a new issue.  Patient states she felt funny before falling but had no chest pain, no dizziness, no shortness of breath, no nausea or vomiting. 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 Authorizing Provider  albuterol  (PROVENTIL ) (2.5 MG/3ML) 0.083% nebulizer solution Take 3 mLs (2.5 mg total) by nebulization every 6 (six) hours as needed for wheezing or shortness of breath. Patient not taking: Reported on 10/19/2023 10/14/23   Ula Prentice SAUNDERS, MD  albuterol  (VENTOLIN  HFA) [phone] Base) MCG/ACT inhaler Inhale 2 puffs into the lungs every 4 (four) hours as needed for wheezing or shortness of breath. 10/14/23   Ula Prentice SAUNDERS, MD  apixaban  (ELIQUIS ) 5 MG TABS tablet Take 1 tablet (5 mg total) by mouth 2 (two) times daily. 10/27/23 11/26/23  Caleen Burgess BROCKS, MD  butalbital -acetaminophen -caffeine  (FIORICET) 50-325-40 MG tablet Take 1-2 tablets by mouth every 6 (six) hours as needed for headache. Patient not taking: Reported on 10/19/2023 02/14/23 02/14/24  Juleen Rush, PA-C  cyclobenzaprine  (FLEXERIL ) 5 MG tablet  Take 1 tablet (5 mg total) by mouth 3 (three) times daily as needed for muscle spasms. 10/27/23   Amin, Burgess BROCKS, MD  HYDROcodone -acetaminophen  (NORCO/VICODIN) 5-325 MG tablet Take 1 tablet by mouth every 6 (six) hours as needed for moderate pain (pain score 4-6). 10/27/23   Caleen Burgess BROCKS, MD  lidocaine -prilocaine  (EMLA ) cream Apply to affected area once 11/03/23   Sherrod Sherrod, MD  ondansetron  (ZOFRAN ) 8 MG tablet Take 1 tablet (8 mg total) by mouth every 8 (eight) hours as needed for nausea or vomiting. Start on the third day after chemotherapy. 11/03/23   Sherrod Sherrod, MD  polyethylene glycol powder (GLYCOLAX /MIRALAX ) 17 GM/SCOOP powder Take 17 g by mouth 2 (two) times daily as needed for mild constipation or moderate constipation. Dissolve 1 capful (17g) in 4-8 ounces of liquid and take by mouth daily. 10/27/23   Amin, Ankit C, MD  prochlorperazine  (COMPAZINE ) 10 MG tablet Take 1 tablet (10 mg total) by mouth every 6 (six) hours as needed. 11/03/23   Heilingoetter, Cassandra L, PA-C  prochlorperazine  (COMPAZINE ) 10 MG tablet Take 1 tablet (10 mg total) by mouth every 6 (six) hours as needed for nausea or vomiting. 11/03/23   Sherrod Sherrod, MD  rosuvastatin  (CRESTOR ) 40 MG tablet Take 1 tablet (40 mg total) by mouth daily. Patient not taking: Reported on 10/19/2023 01/07/22   Shelah Lamar RAMAN, MD  senna-docusate (SENOKOT-S) 8.6-50 MG tablet Take 1 tablet by mouth 2 (two) times daily. 10/27/23  Amin, Ankit C, MD  Vitamin D, Ergocalciferol, (DRISDOL) 1.25 MG (50000 UNIT) CAPS capsule Take 50,000 Units by mouth once a week. Patient not taking: Reported on 10/19/2023 02/09/23   [provider]    Allergies: Bicillin c-r, Penicillins, Prednisone , and Nsaids    Review of Systems  Updated Vital Signs BP 115/60   Pulse (!) 110   Temp 98.3 F (36.8 C) (Oral)   Resp 19   Ht 5' 5 (1.651 m)   Wt 79.4 kg   LMP 11/16/2016   SpO2 98%   BMI 29.12 kg/m   Physical Exam Vitals and nursing  note reviewed.  Constitutional:      General: She is not in acute distress.    Appearance: She is well-developed.  HENT:     Head: Normocephalic and atraumatic.     Mouth/Throat:     Mouth: Mucous membranes are moist.  Eyes:     Conjunctiva/sclera: Conjunctivae normal.  Cardiovascular:     Rate and Rhythm: Normal rate and regular rhythm.     Heart sounds: No murmur heard. Pulmonary:     Effort: Pulmonary effort is normal. No respiratory distress.     Breath sounds: Normal breath sounds.  Abdominal:     Palpations: Abdomen is soft.     Tenderness: There is no abdominal tenderness.  Musculoskeletal:        General: No swelling or tenderness.     Cervical back: Neck supple.     Left lower leg: Edema present.     Comments: There is no tenderness of the right hip, knee or ankle on palpation.  There is no redness or warmth.  Distal pulses in bilateral feet are 1+ in DP and PT arteries  Skin:    General: Skin is warm and dry.     Capillary Refill: Capillary refill takes less than 2 seconds.  Neurological:     General: No focal deficit present.     Mental Status: She is alert and oriented to person, place, and time.  Psychiatric:        Mood and Affect: Mood normal.     (all labs ordered are listed, but only abnormal results are displayed) Labs Reviewed  CBC WITH DIFFERENTIAL/PLATELET - Abnormal; Notable for the following components:      Result Value   RBC 3.78 (*)    Hemoglobin 10.7 (*)    HCT 33.3 (*)    Lymphs Abs 0.6 (*)    All other components within normal limits  COMPREHENSIVE METABOLIC PANEL WITH GFR - Abnormal; Notable for the following components:   Glucose, Bld 127 (*)    All other components within normal limits  URINALYSIS, ROUTINE W REFLEX MICROSCOPIC - Abnormal; Notable for the following components:   Specific Gravity, Urine >1.046 (*)    Protein, ur TRACE (*)    All other components within normal limits    EKG: EKG Interpretation Date/Time:  Thursday  November 11 2023 18:26:07 EDT Ventricular Rate:  114 PR Interval:  143 QRS Duration:  94 QT Interval:  329 QTC Calculation: 453 R Axis:   73  Text Interpretation: Sinus tachycardia Nonspecific T abnormalities, inferior leads Confirmed by Zackowski, Scott [phone]) on 11/11/2023 6:38:01 PM  Radiology: CT Angio Chest PE W and/or Wo Contrast Result Date: 11/11/2023 CLINICAL DATA:  Clemens 2 days ago with head and neck trauma and midline neck tenderness. Reports taking blood thinners. Increased leg weakness and swelling. Pulmonary embolism suspected. Prior history of lung cancer. EXAM: CT  HEAD WITHOUT CONTRAST CT CERVICAL SPINE WITHOUT CONTRAST CT CHEST, ABDOMEN AND PELVIS WITH CONTRAST TECHNIQUE: Contiguous axial images were obtained from the base of the skull through the vertex without intravenous contrast. Multidetector CT imaging of the cervical spine was performed without intravenous contrast. Multiplanar CT image reconstructions were also generated. Multidetector CT imaging of the chest was performed following the standard protocol during bolus administration of intravenous contrast. Multiplanar CT image reconstructions and MIPS were obtained to evaluate the vascular anatomy. RADIATION DOSE REDUCTION: This exam was performed according to the departmental dose-optimization program which includes automated exposure control, adjustment of the mA and/or kV according to patient size and/or use of iterative reconstruction technique. CONTRAST:  OMNIPAQUE  IOHEXOL  350 MG/ML SOLN COMPARISON:  MRI brain 10/19/2023, CTA head and neck and reconstructions 11/08/2021, soft tissue neck CT 10/18/2023, CTA chest 10/18/2023 and 10/14/2023. FINDINGS: CT HEAD FINDINGS Brain: There is moderately advanced atrophy, small-vessel disease and atrophic ventriculomegaly. Multiple tiny chronic bilateral cerebellar lacunar infarcts are again noted, small chronic lacunar infarcts in the right thalamus and right pons, and a few  bilateral chronic basal ganglia lacunae are again noted. No cortical based acute infarct, hemorrhage, mass or mass effect are seen. No new midline shift. Basal cisterns are clear. Vascular: Age advanced calcific plaque both siphons. No hyperdense vessel is seen. 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 If this is still present, it is not visible with noncontrast CT. Skull: Negative for fractures or focal lesions. Sinuses/Orbits: Negative orbits. Increased patchy opacification ethmoid air cells and increased membrane thickening both maxillary and frontal sinuses. The sphenoid sinuses clear. Again noted is a diffuse right mastoid effusion and partial opacification in the right middle ear. There is increased moderate patchy left mastoid effusion with clear left middle ear cavity. Other: None. CT CERVICAL FINDINGS Alignment: There is a straightened slightly reversed cervical lordosis. There is no listhesis. No widening of the anterior atlantodental joint. No C1-2 lateral mass offset. Skull base and vertebrae: No acute fracture is evident. No primary bone lesion or focal pathologic process. Soft tissues and spinal canal: No prevertebral fluid or swelling. No visible canal hematoma. There is trace calcification at the carotid bifurcations. There is no thyroid  or laryngeal mass. Unremarkable parotid and submandibular glands. Disc levels: The C5-6 disc is degenerative with small bidirectional osteophytes. The other discs are normal in heights. No significant soft tissue or bony encroachment on the thecal sac and cord is seen. There are slight facet spurring changes at most levels, uncinate spurring at C5-6. Foraminal stenosis is only seen at C5-6 where it is mild. The other foramina appear to be patent. Small anterior endplate spurring is seen at C3-4, C4-5 and C6-7 but no bulky osteophytes or bridging bone. Other:  None. CT  CHEST FINDINGS Cardiovascular: There are extensive mediastinal, anterior chest wall and paraspinal venous collateral vessels filling with dense contrast. 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 The SVC was better demonstrated on last study due to the right-sided injection on that exam. Extensive but nonocclusive thrombus was previously noted in the right axillosubclavian vein and could still be present, but this is not evaluated due to the left-sided injection today. The distal SVC and cavoatrial junction, and the cardiac chambers opacify well. Nonocclusive thrombus in the azygous vein is also again noted. Partially  occlusive thrombus also appears to still be present within an engorged right internal mammary vein on series 8 images 40-54. The heart is slightly enlarged, with left ventricular and septal hypertrophy and patchy calcification of the mitral ring. There is no enlargement of the right chambers. Small pericardial effusion is again noted. There are normal caliber pulmonary arteries, adequately opacified to the segmental level with no evidence of arterial embolism, but the subsegmental arteries largely unopacified and not evaluated. There are moderate patchy calcific plaques in the aorta and proximal great vessels skull with heavy calcifications and thickening of the aortic valve leaflets without aortic aneurysm, dissection or stenosis. There are scattered three-vessel coronary artery calcific plaques. The pulmonary veins are normal in caliber. Mediastinum/Nodes: Stable prominent right mid hilar lymph node of 1.4 cm short axis on 8:54. Right paratracheal area is difficult to assess due to the engorged thrombosed SVC but no new abnormality is seen in the area. I do not see further adenopathy. Lungs/Pleura: Interval new small layering right pleural effusion. No left pleural  effusion. Right upper lobe irregularly thick-walled cavitary lesion posteriorly again noted measuring 5.1 x 4.1 cm, not notably changed in size but with increased fluid now almost completely filling the cavity. Infectious complication not excluded. There are scarring changes, mild volume loss in the right middle lobe. Stable 7 mm ground-glass right upper lobe nodule on 10:66. Remaining lungs are generally clear. Upper abdomen: No acute abnormality. Musculoskeletal: No regional bone metastases. Review of the MIP images confirms the above findings. IMPRESSION: 1. No acute intracranial CT findings or depressed skull fractures. 2. Atrophy, small-vessel disease and multiple chronic lacunar infarcts. 3. Sinus disease with increased membrane thickening in the frontal and maxillary sinuses, increased patchy opacification of the ethmoid air cells. 4. Persistent right mastoid effusion and partial opacification of the right middle ear. Interval increased left mastoid effusion. 5. No evidence of cervical fractures or malalignment. 6. C5-6 degenerative disc disease and spondylosis with mild foraminal stenosis. 7. No evidence of pulmonary arterial dilatation or embolus through the segmental level. Subsegmental arteries are largely unopacified and not evaluated. 8. 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 9. Not well evaluated, there may still be nonocclusive thrombus in the right axillosubclavian vein and azygous vein, and there is partially occlusive thrombus in the right internal mammary vein. 10. Interval new small layering right pleural effusion. 11. 5.1 x 4.1 cm right upper lobe cavitary mass is now almost fully filled with fluid. Infectious complication not excluded. 12. Stable 7 mm ground-glass right upper lobe nodule. 13. Stable prominent right mid hilar lymph  node. 14. Aortic and coronary artery atherosclerosis. Heavily calcified thickened aortic valve leaflets. Aortic Atherosclerosis (ICD10-I70.0). Electronically Signed   By: Francis Quam M.D.   On: 11/11/2023 20:59   CT Head Wo Contrast Result Date: 11/11/2023 CLINICAL DATA:  Clemens 2 days ago with head and neck trauma and midline neck tenderness. Reports taking blood thinners. Increased leg weakness and swelling. Pulmonary embolism suspected. Prior history of lung cancer. EXAM: CT HEAD WITHOUT CONTRAST CT CERVICAL SPINE WITHOUT CONTRAST CT CHEST, ABDOMEN AND PELVIS WITH CONTRAST TECHNIQUE: Contiguous axial images were obtained from the base of the skull through the vertex without intravenous contrast. Multidetector CT imaging of the cervical spine was performed without intravenous contrast. Multiplanar CT image reconstructions were also generated. Multidetector CT imaging of the chest was performed following the standard protocol  during bolus administration of intravenous contrast. Multiplanar CT image reconstructions and MIPS were obtained to evaluate the vascular anatomy. RADIATION DOSE REDUCTION: This exam was performed according to the departmental dose-optimization program which includes automated exposure control, adjustment of the mA and/or kV according to patient size and/or use of iterative reconstruction technique. CONTRAST:  OMNIPAQUE  IOHEXOL  350 MG/ML SOLN COMPARISON:  MRI brain 10/19/2023, CTA head and neck and reconstructions 11/08/2021, soft tissue neck CT 10/18/2023, CTA chest 10/18/2023 and 10/14/2023. FINDINGS: CT HEAD FINDINGS Brain: There is moderately advanced atrophy, small-vessel disease and atrophic ventriculomegaly. Multiple tiny chronic bilateral cerebellar lacunar infarcts are again noted, small chronic lacunar infarcts in the right thalamus and right pons, and a few bilateral chronic basal ganglia lacunae are again noted. No cortical based acute infarct, hemorrhage, mass or mass  effect are seen. No new midline shift. Basal cisterns are clear. Vascular: Age advanced calcific plaque both siphons. No hyperdense vessel is seen. 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 If this is still present, it is not visible with noncontrast CT. Skull: Negative for fractures or focal lesions. Sinuses/Orbits: Negative orbits. Increased patchy opacification ethmoid air cells and increased membrane thickening both maxillary and frontal sinuses. The sphenoid sinuses clear. Again noted is a diffuse right mastoid effusion and partial opacification in the right middle ear. There is increased moderate patchy left mastoid effusion with clear left middle ear cavity. Other: None. CT CERVICAL FINDINGS Alignment: There is a straightened slightly reversed cervical lordosis. There is no listhesis. No widening of the anterior atlantodental joint. No C1-2 lateral mass offset. Skull base and vertebrae: No acute fracture is evident. No primary bone lesion or focal pathologic process. Soft tissues and spinal canal: No prevertebral fluid or swelling. No visible canal hematoma. There is trace calcification at the carotid bifurcations. There is no thyroid  or laryngeal mass. Unremarkable parotid and submandibular glands. Disc levels: The C5-6 disc is degenerative with small bidirectional osteophytes. The other discs are normal in heights. No significant soft tissue or bony encroachment on the thecal sac and cord is seen. There are slight facet spurring changes at most levels, uncinate spurring at C5-6. Foraminal stenosis is only seen at C5-6 where it is mild. The other foramina appear to be patent. Small anterior endplate spurring is seen at C3-4, C4-5 and C6-7 but no bulky osteophytes or bridging bone. Other:  None. CT CHEST FINDINGS Cardiovascular: There are extensive mediastinal, anterior chest wall and paraspinal venous  collateral vessels filling with dense contrast. 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 The SVC was better demonstrated on last study due to the right-sided injection on that exam. Extensive but nonocclusive thrombus was previously noted in the right axillosubclavian vein and could still be present, but this is not evaluated due to the left-sided injection today. The distal SVC and cavoatrial junction, and the cardiac chambers opacify well. Nonocclusive thrombus in the azygous vein is also again noted. Partially occlusive thrombus also appears to still be present within an engorged right internal mammary vein on series 8 images 40-54. The heart is slightly enlarged, with left ventricular and septal hypertrophy and patchy calcification of the mitral ring. There is no enlargement of the right chambers. Small pericardial effusion is again noted. There are normal caliber pulmonary arteries, adequately opacified to the segmental level  with no evidence of arterial embolism, but the subsegmental arteries largely unopacified and not evaluated. There are moderate patchy calcific plaques in the aorta and proximal great vessels skull with heavy calcifications and thickening of the aortic valve leaflets without aortic aneurysm, dissection or stenosis. There are scattered three-vessel coronary artery calcific plaques. The pulmonary veins are normal in caliber. Mediastinum/Nodes: Stable prominent right mid hilar lymph node of 1.4 cm short axis on 8:54. Right paratracheal area is difficult to assess due to the engorged thrombosed SVC but no new abnormality is seen in the area. I do not see further adenopathy. Lungs/Pleura: Interval new small layering right pleural effusion. No left pleural effusion. Right upper lobe irregularly thick-walled cavitary lesion posteriorly again noted measuring 5.1 x 4.1  cm, not notably changed in size but with increased fluid now almost completely filling the cavity. Infectious complication not excluded. There are scarring changes, mild volume loss in the right middle lobe. Stable 7 mm ground-glass right upper lobe nodule on 10:66. Remaining lungs are generally clear. Upper abdomen: No acute abnormality. Musculoskeletal: No regional bone metastases. Review of the MIP images confirms the above findings. IMPRESSION: 1. No acute intracranial CT findings or depressed skull fractures. 2. Atrophy, small-vessel disease and multiple chronic lacunar infarcts. 3. Sinus disease with increased membrane thickening in the frontal and maxillary sinuses, increased patchy opacification of the ethmoid air cells. 4. Persistent right mastoid effusion and partial opacification of the right middle ear. Interval increased left mastoid effusion. 5. No evidence of cervical fractures or malalignment. 6. C5-6 degenerative disc disease and spondylosis with mild foraminal stenosis. 7. No evidence of pulmonary arterial dilatation or embolus through the segmental level. Subsegmental arteries are largely unopacified and not evaluated. 8. 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 9. Not well evaluated, there may still be nonocclusive thrombus in the right axillosubclavian vein and azygous vein, and there is partially occlusive thrombus in the right internal mammary vein. 10. Interval new small layering right pleural effusion. 11. 5.1 x 4.1 cm right upper lobe cavitary mass is now almost fully filled with fluid. Infectious complication not excluded. 12. Stable 7 mm ground-glass right upper lobe nodule. 13. Stable prominent right mid hilar lymph node. 14. Aortic and coronary artery atherosclerosis. Heavily calcified thickened aortic valve leaflets. Aortic  Atherosclerosis (ICD10-I70.0). Electronically Signed   By: Francis Quam M.D.   On: 11/11/2023 20:59   CT Cervical Spine Wo Contrast Result Date: 11/11/2023 CLINICAL DATA:  Clemens 2 days ago with head and neck trauma and midline neck tenderness. Reports taking blood thinners. Increased leg weakness and swelling. Pulmonary embolism suspected. Prior history of lung cancer. EXAM: CT HEAD WITHOUT CONTRAST CT CERVICAL SPINE WITHOUT CONTRAST CT CHEST, ABDOMEN AND PELVIS WITH CONTRAST TECHNIQUE: Contiguous axial images were obtained from the base of the skull through the vertex without intravenous contrast. Multidetector CT imaging of the cervical spine was performed without intravenous contrast. Multiplanar CT image reconstructions were also generated. Multidetector CT imaging of the chest was performed following the standard protocol during bolus administration of intravenous contrast. Multiplanar CT image reconstructions and MIPS were obtained to evaluate the vascular anatomy. RADIATION DOSE REDUCTION: This exam was performed according to the departmental dose-optimization program which includes automated exposure control, adjustment of the mA and/or kV according to patient size and/or use of iterative reconstruction technique. CONTRAST:  OMNIPAQUE  IOHEXOL  350 MG/ML SOLN COMPARISON:  MRI brain 10/19/2023, CTA head and neck and reconstructions 11/08/2021, soft tissue neck CT 10/18/2023, CTA chest 10/18/2023 and 10/14/2023. FINDINGS: CT HEAD FINDINGS Brain: There is moderately advanced atrophy, small-vessel disease and atrophic ventriculomegaly. Multiple tiny chronic bilateral cerebellar lacunar infarcts are again noted, small chronic lacunar infarcts in the right thalamus and right pons, and a few bilateral chronic basal ganglia lacunae are again noted. No cortical based acute infarct, hemorrhage, mass or mass effect are seen. No new midline shift. Basal cisterns are clear. Vascular: Age advanced calcific plaque  both siphons. No hyperdense vessel is seen. 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 If this is still present, it is not visible with noncontrast CT. Skull: Negative for fractures or focal lesions. Sinuses/Orbits: Negative orbits. Increased patchy opacification ethmoid air cells and increased membrane thickening both maxillary and frontal sinuses. The sphenoid sinuses clear. Again noted is a diffuse right mastoid effusion and partial opacification in the right middle ear. There is increased moderate patchy left mastoid effusion with clear left middle ear cavity. Other: None. CT CERVICAL FINDINGS Alignment: There is a straightened slightly reversed cervical lordosis. There is no listhesis. No widening of the anterior atlantodental joint. No C1-2 lateral mass offset. Skull base and vertebrae: No acute fracture is evident. No primary bone lesion or focal pathologic process. Soft tissues and spinal canal: No prevertebral fluid or swelling. No visible canal hematoma. There is trace calcification at the carotid bifurcations. There is no thyroid  or laryngeal mass. Unremarkable parotid and submandibular glands. Disc levels: The C5-6 disc is degenerative with small bidirectional osteophytes. The other discs are normal in heights. No significant soft tissue or bony encroachment on the thecal sac and cord is seen. There are slight facet spurring changes at most levels, uncinate spurring at C5-6. Foraminal stenosis is only seen at C5-6 where it is mild. The other foramina appear to be patent. Small anterior endplate spurring is seen at C3-4, C4-5 and C6-7 but no bulky osteophytes or bridging bone. Other:  None. CT CHEST FINDINGS Cardiovascular: There are extensive mediastinal, anterior chest wall and paraspinal venous collateral vessels filling with dense contrast. Thrombotic occlusion in the brachiocephalic venous arch continues  to be seen extending into the SVC although there may still be a very small residual SVC lumen laterally. The SVC was better demonstrated on last study due to the right-sided injection on that exam. Extensive but nonocclusive thrombus was previously noted in the right axillosubclavian vein and could still be present, but this is not evaluated due to the left-sided injection today. The distal SVC and cavoatrial junction, and the cardiac chambers opacify well. Nonocclusive thrombus in the azygous vein is also again noted. Partially occlusive thrombus also appears to still be present within an engorged right internal mammary vein on series 8 images 40-54. The heart is slightly enlarged, with left ventricular and septal hypertrophy and patchy calcification of the mitral ring. There is no enlargement of the right chambers. Small pericardial effusion is again noted. There are normal caliber pulmonary arteries, adequately opacified to the segmental level with no evidence of arterial embolism, but the subsegmental arteries largely unopacified and not evaluated. There are moderate patchy calcific plaques in the aorta and proximal great vessels skull with heavy calcifications and thickening of the aortic valve leaflets without aortic aneurysm, dissection or stenosis. There are scattered three-vessel coronary artery calcific plaques. The pulmonary veins are normal in caliber. Mediastinum/Nodes: Stable prominent  right mid hilar lymph node of 1.4 cm short axis on 8:54. Right paratracheal area is difficult to assess due to the engorged thrombosed SVC but no new abnormality is seen in the area. I do not see further adenopathy. Lungs/Pleura: Interval new small layering right pleural effusion. No left pleural effusion. Right upper lobe irregularly thick-walled cavitary lesion posteriorly again noted measuring 5.1 x 4.1 cm, not notably changed in size but with increased fluid now almost completely filling the cavity. Infectious  complication not excluded. There are scarring changes, mild volume loss in the right middle lobe. Stable 7 mm ground-glass right upper lobe nodule on 10:66. Remaining lungs are generally clear. Upper abdomen: No acute abnormality. Musculoskeletal: No regional bone metastases. Review of the MIP images confirms the above findings. IMPRESSION: 1. No acute intracranial CT findings or depressed skull fractures. 2. Atrophy, small-vessel disease and multiple chronic lacunar infarcts. 3. Sinus disease with increased membrane thickening in the frontal and maxillary sinuses, increased patchy opacification of the ethmoid air cells. 4. Persistent right mastoid effusion and partial opacification of the right middle ear. Interval increased left mastoid effusion. 5. No evidence of cervical fractures or malalignment. 6. C5-6 degenerative disc disease and spondylosis with mild foraminal stenosis. 7. No evidence of pulmonary arterial dilatation or embolus through the segmental level. Subsegmental arteries are largely unopacified and not evaluated. 8. 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 9. Not well evaluated, there may still be nonocclusive thrombus in the right axillosubclavian vein and azygous vein, and there is partially occlusive thrombus in the right internal mammary vein. 10. Interval new small layering right pleural effusion. 11. 5.1 x 4.1 cm right upper lobe cavitary mass is now almost fully filled with fluid. Infectious complication not excluded. 12. Stable 7 mm ground-glass right upper lobe nodule. 13. Stable prominent right mid hilar lymph node. 14. Aortic and coronary artery atherosclerosis. Heavily calcified thickened aortic valve leaflets. Aortic Atherosclerosis (ICD10-I70.0). Electronically Signed   By: Francis Quam M.D.   On: 11/11/2023 20:59   US   Venous Img Lower Right (DVT Study) Result Date: 11/11/2023 EXAM: ULTRASOUND DUPLEX OF THE RIGHT LOWER EXTREMITY VEINS TECHNIQUE: Duplex ultrasound using B-mode/gray scaled imaging and Doppler spectral analysis and color flow was obtained of the deep venous structures of the right lower extremity. COMPARISON: None. CLINICAL HISTORY: Swelling, lung cancer. FINDINGS: The common femoral vein, femoral vein, popliteal vein, and posterior tibial vein of the right lower extremity demonstrate normal compressibility with normal color flow and spectral analysis. IMPRESSION: 1. No evidence of DVT. Electronically signed by: Ozell Daring MD 11/11/2023 07:10 PM EDT RP Workstation: [id number]     Procedures   Medications Ordered in the ED  azithromycin  (ZITHROMAX ) 500 mg in sodium chloride  0.9 % 250 mL IVPB (500 mg Intravenous New Bag/Given 11/11/23 2315)  lactated ringers  bolus 500 mL (0 mLs Intravenous Stopped 11/11/23 2101)  iohexol  (OMNIPAQUE ) 350 MG/ML injection 100 mL (100 mLs Intravenous Contrast Given 11/11/23 1952)  cefTRIAXone  (ROCEPHIN ) 1 g in sodium chloride  0.9 % 100 mL IVPB (0 g Intravenous Stopped 11/11/23 2316)  lactated ringers  bolus 500 mL (0 mLs Intravenous Stopped 11/11/23 2238)                                    Medical Decision Making This patient presents to the ED  for concern of generalized weakness, left leg swelling and multiple falls, this involves an extensive number of treatment options, and is a complaint that carries with it a high risk of complications and morbidity.  The differential diagnosis includes dehydration, electrolyte disturbance, UTI, PE, DVT, sprain, strain, intracranial hemorrhage, CVA, other   Co morbidities that complicate the patient evaluation :   Lung cancer, cerebral venous sinus thrombosis   Additional history obtained:  Additional history obtained from EMR External records from outside source obtained and reviewed including previous notes, labs and  imaging   Lab Tests:  I Ordered, and personally interpreted labs.  The pertinent results include: CBC and CMP are reassuring, anemia baseline, UA has elevated specific gravity otherwise normal   Imaging Studies ordered:  I ordered imaging studies including CT head which shows no intracranial hemorrhage or skull fracture, increased sinus membrane thickening of frontal and maxillary sinuses and patchy opacification of ethmoid air cells and right mastoid effusion  CT C-spine shows no fracture or traumatic malalignment, CT chest shows similar thrombosis to prior CT of SVC, right IJ, axillary vein.  Her cavitary lesion is now fluid-filled concerning for possible infection. I independently visualized and interpreted imaging within scope of identifying emergent findings  I agree with the radiologist interpretation   Cardiac Monitoring: / EKG:  The patient was maintained on a cardiac monitor.  I personally viewed and interpreted the cardiac monitored which showed an underlying rhythm of: sinus tachycardia   Consultations Obtained:  I requested consultation with the hospitalist Dr. Franky,  and discussed lab and imaging findings as well as pertinent plan - they recommend: Patient for generalized weakness, ambulatory dysfunction   Problem List / ED Course / Critical interventions / Medication management  Generalized weakness with multiple falls over the past week at home.  Patient no longer able to ambulate on her own but like she was previously.  She has no focal weakness, no numbness or tingling.  Imaging was all essentially stable although her cavitary lesion of her lung is now fluid-filled.  She was persistently tachycardic, improved transiently with IV fluids.  She does have increased severe gravity on her urinalysis so dehydration may be the culprit.  Given the fact that she cannot even stand on her own at home and is still somewhat tachycardic feel she needs to be admitted for further  evaluation at this time.  Given IV antibiotics for possible pneumonia related to her cavitary lesion.  I have reviewed the patients home medicines and have made adjustments as needed    Amount and/or Complexity of Data Reviewed Labs: ordered. Radiology: ordered. ECG/medicine tests: ordered.  Risk Prescription drug management. Decision regarding hospitalization.        Final diagnoses:  Weakness    ED Discharge Orders     None          Andrea Mullen 11/11/23 2336    Geraldene Hamilton, MD 11/12/23 [phone]

## 2023-11-11 NOTE — ED Triage Notes (Signed)
 Pt reports falling x2 days ago and landing on carpet. Pt reports taking blood thinners. Pt denies any LOC. Family report pt did strike head. Pt reports increased leg weakness and swelling.

## 2023-11-11 NOTE — Therapy (Incomplete)
 OUTPATIENT PHYSICAL THERAPY NEURO EVALUATION   Patient Name: Andrea Mullen MRN: [id number] DOB:[birth year], 58 y.o., female Today's Date: 11/11/2023   PCP: *** REFERRING PROVIDER: Caleen Burgess BROCKS, MD >***  END OF SESSION:   Past Medical History:  Diagnosis Date   Anxiety    Asthma    Blind    left eye   Cancer (HCC)    lung, tx radiation and chemo   Hyperlipemia    Hypertension    Stroke [facility])    Past Surgical History:  Procedure Laterality Date   CARDIAC CATHETERIZATION N/A 11/01/2014   Procedure: Left Heart Cath and Coronary Angiography;  Surgeon: Victory LELON Sharps, MD;  Location: [facility] INVASIVE CV LAB;  Service: Cardiovascular;  Laterality: N/A;   CESAREAN SECTION     x3   HERNIA REPAIR     VIDEO BRONCHOSCOPY WITH ENDOBRONCHIAL ULTRASOUND Right 10/22/2023   Procedure: BRONCHOSCOPY, WITH EBUS;  Surgeon: Jude Harden GAILS, MD;  Location: [facility] ENDOSCOPY;  Service: Cardiopulmonary;  Laterality: Right;   Patient Active Problem List   Diagnosis Date Noted   Non-small cell lung cancer (HCC) 11/03/2023   Goals of care, counseling/discussion 11/03/2023   Acute embolism and thrombosis of right subclavian vein (HCC) 10/25/2023   Acute respiratory distress 10/19/2023   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   Venous thrombosis 10/19/2023   Acute thrombosis of right axillary vein (HCC) 10/19/2023   Subclavian vein thrombosis (HCC) 10/19/2023   History of lung cancer 10/19/2023   Left leg weakness 10/19/2023   Mediastinal lymphadenopathy 10/19/2023   Cavitary lesion of lung 10/19/2023   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   Aortic stenosis 11/10/2021   Stroke (HCC) 11/08/2021   CAD (coronary artery disease) 11/08/2021   Microcytic anemia 11/08/2021   Asthma, chronic 11/08/2021   HTN (hypertension) 11/08/2021   HLD (hyperlipidemia) 11/08/2021   Elevated troponin    NSTEMI (non-ST elevated myocardial infarction) (HCC)     Polysubstance abuse (HCC)    Anxiety state    Atypical chest pain 10/30/2014   Smoking 10/30/2014   Chest pain 10/30/2014   Homicidal ideation    MALIGNANT NEOPLASM BRONCHUS&LUNG UNSPEC SITE 07/11/2007   Morbid obesity (HCC) 07/11/2007   TOBACCO ABUSE-HISTORY OF 07/11/2007   Cavitating mass in right upper lung lobe 06/07/2007    ONSET DATE: 10/27/2023 (MD referral)  REFERRING DIAG:  G08 (ICD-10-CM) - Cerebral venous thrombosis of sigmoid sinus  R53.1 (ICD-10-CM) - Left-sided weakness  E66.01 (ICD-10-CM) - Morbid obesity (HCC)  I82.A11 (ICD-10-CM) - Acute thrombosis of right axillary vein (HCC)  I82.B11 (ICD-10-CM) - Thrombosis of right subclavian vein (HCC)    THERAPY DIAG:  No diagnosis found.  Rationale for Evaluation and Treatment: Rehabilitation  SUBJECTIVE:  SUBJECTIVE STATEMENT: *** Pt accompanied by: {accompnied:27141}  PERTINENT HISTORY: ***  PAIN:  Are you having pain? {OPRCPAIN:27236}  PRECAUTIONS: {Therapy precautions:24002}  RED FLAGS: {PT Red Flags:29287}   WEIGHT BEARING RESTRICTIONS: {Yes ***/No:24003}  FALLS: Has patient fallen in last 6 months? {fallsyesno:27318}  LIVING ENVIRONMENT: Lives with: {OPRC lives with:25569::lives with their family} Lives in: {Lives in:25570} Stairs: {opstairs:27293} Has following equipment at home: {Assistive devices:23999}  PLOF: {PLOF:24004}  PATIENT GOALS: ***  OBJECTIVE:  Note: Objective measures were completed at Evaluation unless otherwise noted.  DIAGNOSTIC FINDINGS: ***  COGNITION: Overall cognitive status: {cognition:24006}   SENSATION: {sensation:27233}  COORDINATION: ***  EDEMA:  {edema:24020}  MUSCLE TONE: {LE tone:25568}  MUSCLE LENGTH: Hamstrings: Right *** deg; Left *** deg Debby test: Right  *** deg; Left *** deg  DTRs:  {DTR SITE:24025}  POSTURE: {posture:25561}  LOWER EXTREMITY ROM:     {AROM/PROM:27142}  Right Eval Left Eval  Hip flexion    Hip extension    Hip abduction    Hip adduction    Hip internal rotation    Hip external rotation    Knee flexion    Knee extension    Ankle dorsiflexion    Ankle plantarflexion    Ankle inversion    Ankle eversion     (Blank rows = not tested)  LOWER EXTREMITY MMT:    MMT Right Eval Left Eval  Hip flexion    Hip extension    Hip abduction    Hip adduction    Hip internal rotation    Hip external rotation    Knee flexion    Knee extension    Ankle dorsiflexion    Ankle plantarflexion    Ankle inversion    Ankle eversion    (Blank rows = not tested)  BED MOBILITY:  {bed mobility:32615:p}  TRANSFERS: {transfers eval:32620}  RAMP:  {ramp eval:32616}  CURB:  {curb eval:32617}  STAIRS: {stairs eval:32618} GAIT: Findings: {GaitneuroPT:32644::Distance walked: ***,Comments: ***}  FUNCTIONAL TESTS:  {Functional tests:24029}  PATIENT SURVEYS:  {rehab surveys:24030}                                                                                                                              TREATMENT DATE: ***    PATIENT EDUCATION: Education details: *** Person educated: {Person educated:25204} Education method: {Education Method:25205} Education comprehension: {Education Comprehension:25206}  HOME EXERCISE PROGRAM: ***  GOALS: Goals reviewed with patient? {yes/no:20286}  SHORT TERM GOALS: Target date: ***  *** Baseline: Goal status: INITIAL  2.  *** Baseline:  Goal status: INITIAL  3.  *** Baseline:  Goal status: INITIAL  4.  *** Baseline:  Goal status: INITIAL  5.  *** Baseline:  Goal status: INITIAL  6.  *** Baseline:  Goal status: INITIAL  LONG TERM GOALS: Target date: ***  *** Baseline:  Goal status: INITIAL  2.  *** Baseline:  Goal status:  INITIAL  3.  *** Baseline:  Goal status: INITIAL  4.  *** Baseline:  Goal status: INITIAL  5.  *** Baseline:  Goal status: INITIAL  6.  *** Baseline:  Goal status: INITIAL  ASSESSMENT:  CLINICAL IMPRESSION: Patient is a 58 y.o. female who was seen today for physical therapy evaluation and treatment for ***.   OBJECTIVE IMPAIRMENTS: {opptimpairments:25111}.   ACTIVITY LIMITATIONS: {activitylimitations:27494}  PARTICIPATION LIMITATIONS: {participationrestrictions:25113}  PERSONAL FACTORS: {Personal factors:25162} are also affecting patient's functional outcome.   REHAB POTENTIAL: {rehabpotential:25112}  CLINICAL DECISION MAKING: {clinical decision making:25114}  EVALUATION COMPLEXITY: {Evaluation complexity:25115}  PLAN:  PT FREQUENCY: {rehab frequency:25116}  PT DURATION: {rehab duration:25117}  PLANNED INTERVENTIONS: {rehab planned interventions:25118::97110-Therapeutic exercises,97530- Therapeutic [phone]- Neuromuscular re-education,97535- Self Rjmz,02859- Manual therapy,Patient/Family education}  PLAN FOR NEXT SESSION: ***   Dalma Panchal W., PT 11/11/2023, 1:10 PM  [facility] at [facility] [address] [place], [place] [address] Phone # [phone] Fax # [phone]

## 2023-11-12 ENCOUNTER — Encounter (HOSPITAL_COMMUNITY): Admission: RE | Admit: 2023-11-12

## 2023-11-12 ENCOUNTER — Ambulatory Visit: Admitting: Radiation Oncology

## 2023-11-12 ENCOUNTER — Ambulatory Visit
Admission: RE | Admit: 2023-11-12 | Discharge: 2023-11-12 | Disposition: A | Discharge status: Home / Self Care | Source: Ambulatory Visit | Attending: Radiation Oncology | Admitting: Radiation Oncology

## 2023-11-12 ENCOUNTER — Ambulatory Visit

## 2023-11-12 DIAGNOSIS — Z8673 Personal history of transient ischemic attack (TIA), and cerebral infarction without residual deficits: Secondary | ICD-10-CM

## 2023-11-12 DIAGNOSIS — R531 Weakness: Secondary | ICD-10-CM | POA: Diagnosis not present

## 2023-11-12 DIAGNOSIS — D649 Anemia, unspecified: Secondary | ICD-10-CM

## 2023-11-12 DIAGNOSIS — C3411 Malignant neoplasm of upper lobe, right bronchus or lung: Secondary | ICD-10-CM | POA: Insufficient documentation

## 2023-11-12 LAB — PROCALCITONIN: Procalcitonin: <0.1 ng/mL

## 2023-11-12 MED ORDER — ROSUVASTATIN CALCIUM 20 MG PO TABS
40.0000 mg | ORAL_TABLET | Freq: Every day | ORAL | Status: DC
Start: 2023-11-12 — End: 2023-11-12

## 2023-11-12 MED ORDER — ACETAMINOPHEN 325 MG PO TABS: 650.0000 mg | ORAL_TABLET | Freq: Four times a day (QID) | ORAL | Status: DC | PRN

## 2023-11-12 MED ORDER — ONDANSETRON HCL 4 MG PO TABS
4.0000 mg | ORAL_TABLET | Freq: Four times a day (QID) | ORAL | Status: DC | PRN
  Administered 2023-11-14 – 2023-11-15 (×2): 4 mg via ORAL
  Filled 2023-11-12 (×2): qty 1

## 2023-11-12 MED ORDER — APIXABAN 5 MG PO TABS
5.0000 mg | ORAL_TABLET | Freq: Two times a day (BID) | ORAL | Status: DC
  Administered 2023-11-12 – 2023-11-18 (×12): 5 mg via ORAL
  Filled 2023-11-12 (×12): qty 1

## 2023-11-12 MED ORDER — ONDANSETRON HCL 4 MG/2ML IJ SOLN: 4.0000 mg | Freq: Four times a day (QID) | INTRAMUSCULAR | Status: DC | PRN

## 2023-11-12 MED ORDER — VANCOMYCIN HCL 1500 MG/300ML IV SOLN
1500.0000 mg | INTRAVENOUS | Status: DC
  Administered 2023-11-12 – 2023-11-15 (×4): 1500 mg via INTRAVENOUS
  Filled 2023-11-12 (×5): qty 300

## 2023-11-12 MED ORDER — ALBUTEROL SULFATE (2.5 MG/3ML) 0.083% IN NEBU
2.5000 mg | INHALATION_SOLUTION | Freq: Four times a day (QID) | RESPIRATORY_TRACT | Status: DC | PRN
  Administered 2023-11-16 – 2023-11-18 (×3): 2.5 mg via RESPIRATORY_TRACT
  Filled 2023-11-12 (×3): qty 3

## 2023-11-12 MED ORDER — SODIUM CHLORIDE 0.9 % IV SOLN
2.0000 g | Freq: Three times a day (TID) | INTRAVENOUS | Status: DC
  Administered 2023-11-12 – 2023-11-16 (×12): 2 g via INTRAVENOUS
  Filled 2023-11-12 (×12): qty 12.5

## 2023-11-12 MED ORDER — HYDROCODONE-ACETAMINOPHEN 5-325 MG PO TABS
1.0000 | ORAL_TABLET | Freq: Four times a day (QID) | ORAL | Status: DC | PRN
  Administered 2023-11-12 – 2023-11-15 (×7): 1 via ORAL
  Filled 2023-11-12 (×7): qty 1

## 2023-11-12 MED ORDER — ACETAMINOPHEN 650 MG RE SUPP: 650.0000 mg | Freq: Four times a day (QID) | RECTAL | Status: DC | PRN

## 2023-11-12 NOTE — Progress Notes (Incomplete)
 Radiation Oncology         ([phone]) [phone] ________________________________  Initial Inpatient Consultation  Name: Andrea Mullen MRN: [id number]  Date: 11/11/2023  DOB: 04-04-1965  RR:Rzwuzm, Andrea Mullen  No ref. provider found   REFERRING PHYSICIAN: No ref. provider found  DIAGNOSIS:    ICD-10-CM   1. 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)  CHIEF COMPLAINT: Here to discuss management of right lung cancer  HISTORY OF PRESENT ILLNESS::Andrea Mullen is Mullen 58 y.o. 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 She was treated with chemotherapy and radiation therapy at [facility] She had also established care with Dr. Shelah on October of 2023 due to an abnormal chest CT on 11/09/21 showing Mullen solid 10 Mullen right upper lobe pulmonary nodule, Mullen 5 Mullen ground-glass right upper lobe nodule, and subpleural linear opacities in the right upper lobe consistent with scaring. Follow up imaging was recommended at that time which was never performed.   Pertaining to her present recurrence, she presented to the ED on 10/14/23 with SOB and Mullen productive cough. Mullen CTA of the chest was performed in this setting which revealed Mullen right upper lobe mass measuring approximately 5.7 x 5.3 cm (at the site of the previously demonstrated small RUL pulmonary nodule on her prior chest CT from October 2023), mildly prominent sub-centimeter bilateral supraclavicular lymph nodes, and bulky mediastinal lymphadenopathy measuring up to 3.1 x 2.5 cm, concerning for primary lung malignancy.  Further work-up was recommended, and she was discharged with instructions to pursue OP follow-up.   She then returned to the ED on 10/18/23 with c/o persistent SOB and chest pain. Initial ED work-up consisting of Mullen head and soft tissue neck CT revealed head and neck thrombosis/dural sinus thrombosis and an acute CVA. Neurology and vascular surgery were consulted and she was admitted and started on IV heparin . Mullen CTA of the chest was also performed upon admission which redemonstrated the 4.4 cm x 4.8 cm x 4.6 cm thick walled cavitary lesion within the posterior aspect of the RUL, and mild right hilar lymphadenopathy. CTA findings also included: occlusive thrombus within the brachiocephalic vein, nonocclusive thrombus within the superior vena cava and azygous vein, and extensive, nonocclusive thrombus within the right axillary vein and right subclavian vein.   Pulmonary medicine was consulted and she was cleared to undergo and bronchoscopy with EBUS on 10/22/23 under the care of Dr. Jude. FNA of Mullen 4R lymph node was collected at that time and showed non small cell carcinoma, consistent with squamous cell carcinoma. RUL lavage and brushings were also submitted for cytology; both showed no evidence of malignancy.   Other imaging performed during her hospitalization included:  -- CT venogram of the head on 09/09 which showed: stable thrombosis of the right IJ, right sigmoid sinus, and lateral half of the right transverse dural venous sinus. No other sites of intracranial venous thrombosis were identified, or evidence of intracranial mass effect, hemorrhage, or acute cerebral edema. 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  The remainder of her hospital course consisted of heparin  (later transitioned to Lovenox  at discharge) and abx. She as also started on Eliquis . She was discharged home in stable condition on 10/27/23.    After being discharged, she established care with Dr. Sherrod on 11/03/23. Dr. Sherrod has recommended concurrent chemoradiation with weekly carboplatin and paclitaxel which we will discuss in detail today. She is expected to undergo her first cycle of chemotherapy on 11/15/23.   She was seen in the [facility] ED yesterday for evaluation of left leg swelling and left sided neck pain after several falls on 09/23 resulting in head trauma. Imaging performed in the ED included Mullen CT of the head which showed no evidence of acute trauma. Imaging findings did however show increased sinus membrane thickening of the frontal and maxillary sinuses and patchy opacification of ethmoid air cells and right mastoid effusion. Mullen CT of the cervical spine was then performed which also showed no evidence of acute injury or trauma.   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 CT findings otherwise showed stability of the 7 Mullen ground-glass nodule in the RUL, and stability of the prominent right mid hilar lymph node.   She was started on IV abx for possible PNA related to her cavitary lesion. Admission was recommended and she was transferred to [facility] earlier today for further management.    PREVIOUS RADIATION THERAPY: Yes  small cell lung cancer of the right lung diagnosed in 2007, s/p chemotherapy and radiation therapy   PAST Mullen HISTORY:  has Mullen past Mullen history of Anxiety, Asthma, Blind, Cancer (HCC), Hyperlipemia, Hypertension, and Stroke (HCC).    PAST SURGICAL HISTORY: Past Surgical History:  Procedure Laterality Date   CARDIAC CATHETERIZATION N/Mullen 11/01/2014   Procedure: Left Heart Cath and Coronary Angiography;  Surgeon: Victory LELON Sharps, Mullen;  Location: [facility] INVASIVE CV LAB;  Service: Cardiovascular;  Laterality: N/Mullen;   CESAREAN SECTION     x3   HERNIA REPAIR     VIDEO BRONCHOSCOPY WITH ENDOBRONCHIAL ULTRASOUND Right 10/22/2023   Procedure: BRONCHOSCOPY, WITH EBUS;  Surgeon: Jude Harden GAILS, Mullen;  Location: [facility] ENDOSCOPY;  Service: Cardiopulmonary;  Laterality: Right;    FAMILY HISTORY: family history includes Clotting disorder in her mother.  SOCIAL HISTORY:  reports that she has been smoking cigarettes. She has never used smokeless tobacco. She reports that she does not currently use alcohol. She reports that she does not currently use drugs after having used the  following drugs: Marijuana and Cocaine .  ALLERGIES: Bicillin c-r, Penicillins, Nsaids, and Prednisone   MEDICATIONS:  No current facility-administered medications for this encounter.    REVIEW OF SYSTEMS:  Notable for that above.   PHYSICAL EXAM:  height is 5' 5 (1.651 m) and weight is 175 lb (79.4 kg). Her oral temperature is 98.4 F (36.9 C). Her blood pressure is 102/87 and her pulse is 110 (abnormal). Her respiration is 18 and oxygen saturation is 100%.    General: Alert and oriented, in no acute distress *** HEENT: Head is normocephalic. Extraocular movements are intact. Oropharynx is clear. Neck: Neck is supple, no palpable cervical or supraclavicular lymphadenopathy. Heart: Regular in rate and rhythm with no murmurs, rubs, or gallops. Chest: Clear to auscultation bilaterally, with no rhonchi, wheezes, or rales. Abdomen: Soft, nontender, nondistended, with no rigidity or guarding. Extremities: No cyanosis or edema. Lymphatics: see Neck Exam Skin: No concerning lesions. Musculoskeletal: symmetric strength and muscle tone throughout. Neurologic: Cranial nerves II through XII are grossly intact. No obvious focalities. Speech is fluent. Coordination is intact. Psychiatric: Judgment and insight are intact. Affect is appropriate.   ECOG = ***  0 - Asymptomatic (Fully active, able to carry on all predisease activities without restriction)  1 - Symptomatic but completely ambulatory (Restricted in physically strenuous activity but ambulatory and able to carry out work of Mullen light or sedentary nature. For example, light housework, office work)  2 - Symptomatic, <50% in bed during the day (Ambulatory and capable of all self care but unable to carry out any work activities. Up and about more than 50% of waking hours)  3 - Symptomatic, >50% in bed, but not bedbound (Capable of only limited self-care, confined to bed or chair 50% or more of waking hours)  4 - Bedbound (Completely disabled. Cannot carry on any self-care. Totally confined to bed or chair)  5 - Death   Andrea Mullen, Andrea Mullen, Andrea Mullen, et al. [phone]). Toxicity and response criteria of the [facility]. Am. DOROTHA Bridges. Oncol. 5 (6): 649-55   LABORATORY DATA:  Lab Results  Component Value Date   WBC 4.6 11/11/2023   HGB 10.7 (L) 11/11/2023   HCT 33.3 (L) 11/11/2023   MCV 88.1 11/11/2023   PLT 207 11/11/2023   CMP     Component Value Date/Time   NA 136  11/11/2023 1834   K 4.2 11/11/2023 1834   CL 99 11/11/2023 1834   CO2 26 11/11/2023 1834   GLUCOSE 127 (H) 11/11/2023 1834   BUN 8 11/11/2023 1834   CREATININE 0.73 11/11/2023 1834   CREATININE 0.68 11/03/2023 0922   CREATININE 0.72 04/03/2021 0000   CALCIUM  10.1 11/11/2023 1834   PROT 8.0 11/11/2023 1834   ALBUMIN 3.6 11/11/2023 1834   AST 28 11/11/2023 1834   AST 14 (L) 11/03/2023 0922   ALT 16 11/11/2023 1834   ALT 15 11/03/2023 0922   ALKPHOS 64 11/11/2023 1834   BILITOT 0.6 11/11/2023 1834   BILITOT 0.5 11/03/2023 0922   EGFR 99 04/03/2021 0000   GFRNONAA >60 11/11/2023 1834   GFRNONAA >60 11/03/2023 0922         RADIOGRAPHY: CT Angio Chest PE W and/or Wo Contrast Result Date: 11/11/2023 CLINICAL DATA:  Clemens 2 days ago with head and neck trauma and midline neck tenderness. Reports taking blood thinners. Increased leg weakness and swelling. Pulmonary embolism suspected. Prior history of lung cancer. EXAM: CT HEAD WITHOUT CONTRAST CT CERVICAL SPINE WITHOUT CONTRAST CT  CHEST, ABDOMEN AND PELVIS WITH CONTRAST TECHNIQUE: Contiguous axial images were obtained from the base of the skull through the vertex without intravenous contrast. Multidetector CT imaging of the cervical spine was performed without intravenous contrast. Multiplanar CT image reconstructions were also generated. Multidetector CT imaging of the chest was performed following the standard protocol during bolus administration of intravenous contrast. Multiplanar CT image reconstructions and MIPS were obtained to evaluate the vascular anatomy. RADIATION DOSE REDUCTION: This exam was performed according to the departmental dose-optimization program which includes automated exposure control, adjustment of the mA and/or kV according to patient size and/or use of iterative reconstruction technique. CONTRAST:  OMNIPAQUE  IOHEXOL  350 MG/ML SOLN COMPARISON:  MRI brain 10/19/2023, CTA head and neck and reconstructions 11/08/2021,  soft tissue neck CT 10/18/2023, CTA chest 10/18/2023 and 10/14/2023. FINDINGS: CT HEAD FINDINGS Brain: There is moderately advanced atrophy, small-vessel disease and atrophic ventriculomegaly. Multiple tiny chronic bilateral cerebellar lacunar infarcts are again noted, small chronic lacunar infarcts in the right thalamus and right pons, and Mullen few bilateral chronic basal ganglia lacunae are again noted. No cortical based acute infarct, hemorrhage, mass or mass effect are seen. No new midline shift. Basal cisterns are clear. Vascular: Age advanced calcific plaque both siphons. No hyperdense vessel is seen. 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 If this is still present, it is not visible with noncontrast CT. Skull: Negative for fractures or focal lesions. Sinuses/Orbits: Negative orbits. Increased patchy opacification ethmoid air cells and increased membrane thickening both maxillary and frontal sinuses. The sphenoid sinuses clear. Again noted is Mullen diffuse right mastoid effusion and partial opacification in the right middle ear. There is increased moderate patchy left mastoid effusion with clear left middle ear cavity. Other: None. CT CERVICAL FINDINGS Alignment: There is Mullen straightened slightly reversed cervical lordosis. There is no listhesis. No widening of the anterior atlantodental joint. No C1-2 lateral mass offset. Skull base and vertebrae: No acute fracture is evident. No primary bone lesion or focal pathologic process. Soft tissues and spinal canal: No prevertebral fluid or swelling. No visible canal hematoma. There is trace calcification at the carotid bifurcations. There is no thyroid  or laryngeal mass. Unremarkable parotid and submandibular glands. Disc levels: The C5-6 disc is degenerative with small bidirectional osteophytes. The other discs are normal in heights. No significant soft tissue or bony  encroachment on the thecal sac and cord is seen. There are slight facet spurring changes at most levels, uncinate spurring at C5-6. Foraminal stenosis is only seen at C5-6 where it is mild. The other foramina appear to be patent. Small anterior endplate spurring is seen at C3-4, C4-5 and C6-7 but no bulky osteophytes or bridging bone. Other:  None. CT CHEST FINDINGS Cardiovascular: There are extensive mediastinal, anterior chest wall and paraspinal venous collateral vessels filling with dense contrast. Thrombotic occlusion in the brachiocephalic venous arch continues to be seen extending into the SVC although there may still be Mullen very small residual SVC lumen laterally. The SVC was better demonstrated on last study due to the right-sided injection on that exam. Extensive but nonocclusive thrombus was previously noted in the right axillosubclavian vein and could still be present, but this is not evaluated due to the left-sided injection today. The distal SVC and cavoatrial junction, and the cardiac chambers opacify well. Nonocclusive thrombus in the azygous vein is also again noted. Partially occlusive thrombus also appears to still be present within  an engorged right internal mammary vein on series 8 images 40-54. The heart is slightly enlarged, with left ventricular and septal hypertrophy and patchy calcification of the mitral ring. There is no enlargement of the right chambers. Small pericardial effusion is again noted. There are normal caliber pulmonary arteries, adequately opacified to the segmental level with no evidence of arterial embolism, but the subsegmental arteries largely unopacified and not evaluated. There are moderate patchy calcific plaques in the aorta and proximal great vessels skull with heavy calcifications and thickening of the aortic valve leaflets without aortic aneurysm, dissection or stenosis. There are scattered three-vessel coronary artery calcific plaques. The pulmonary veins are normal  in caliber. Mediastinum/Nodes: Stable prominent right mid hilar lymph node of 1.4 cm short axis on 8:54. Right paratracheal area is difficult to assess due to the engorged thrombosed SVC but no new abnormality is seen in the area. I do not see further adenopathy. Lungs/Pleura: Interval new small layering right pleural effusion. No left pleural effusion. Right upper lobe irregularly thick-walled cavitary lesion posteriorly again noted measuring 5.1 x 4.1 cm, not notably changed in size but with increased fluid now almost completely filling the cavity. Infectious complication not excluded. There are scarring changes, mild volume loss in the right middle lobe. Stable 7 Mullen ground-glass right upper lobe nodule on 10:66. Remaining lungs are generally clear. Upper abdomen: No acute abnormality. Musculoskeletal: No regional bone metastases. Review of the MIP images confirms the above findings. IMPRESSION: 1. No acute intracranial CT findings or depressed skull fractures. 2. Atrophy, small-vessel disease and multiple chronic lacunar infarcts. 3. Sinus disease with increased membrane thickening in the frontal and maxillary sinuses, increased patchy opacification of the ethmoid air cells. 4. Persistent right mastoid effusion and partial opacification of the right middle ear. Interval increased left mastoid effusion. 5. No evidence of cervical fractures or malalignment. 6. C5-6 degenerative disc disease and spondylosis with mild foraminal stenosis. 7. No evidence of pulmonary arterial dilatation or embolus through the segmental level. Subsegmental arteries are largely unopacified and not evaluated. 8. 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 9. Not well evaluated, there may still be nonocclusive thrombus in the right axillosubclavian vein and azygous vein, and there is  partially occlusive thrombus in the right internal mammary vein. 10. Interval new small layering right pleural effusion. 11. 5.1 x 4.1 cm right upper lobe cavitary mass is now almost fully filled with fluid. Infectious complication not excluded. 12. Stable 7 Mullen ground-glass right upper lobe nodule. 13. Stable prominent right mid hilar lymph node. 14. Aortic and coronary artery atherosclerosis. Heavily calcified thickened aortic valve leaflets. Aortic Atherosclerosis (ICD10-I70.0). Electronically Signed   By: Andrea Quam M.D.   On: 11/11/2023 20:59   CT Head Wo Contrast Result Date: 11/11/2023 CLINICAL DATA:  Clemens 2 days ago with head and neck trauma and midline neck tenderness. Reports taking blood thinners. Increased leg weakness and swelling. Pulmonary embolism suspected. Prior history of lung cancer. EXAM: CT HEAD WITHOUT CONTRAST CT CERVICAL SPINE WITHOUT CONTRAST CT CHEST, ABDOMEN AND PELVIS WITH CONTRAST TECHNIQUE: Contiguous axial images were obtained from the base of the skull through the vertex without intravenous contrast. Multidetector CT imaging of the cervical spine was performed without intravenous contrast. Multiplanar CT image reconstructions were also generated. Multidetector CT imaging of the chest was performed following the standard protocol during bolus administration of intravenous contrast. Multiplanar CT image  reconstructions and MIPS were obtained to evaluate the vascular anatomy. RADIATION DOSE REDUCTION: This exam was performed according to the departmental dose-optimization program which includes automated exposure control, adjustment of the mA and/or kV according to patient size and/or use of iterative reconstruction technique. CONTRAST:  OMNIPAQUE  IOHEXOL  350 MG/ML SOLN COMPARISON:  MRI brain 10/19/2023, CTA head and neck and reconstructions 11/08/2021, soft tissue neck CT 10/18/2023, CTA chest 10/18/2023 and 10/14/2023. FINDINGS: CT HEAD FINDINGS Brain: There is moderately  advanced atrophy, small-vessel disease and atrophic ventriculomegaly. Multiple tiny chronic bilateral cerebellar lacunar infarcts are again noted, small chronic lacunar infarcts in the right thalamus and right pons, and Mullen few bilateral chronic basal ganglia lacunae are again noted. No cortical based acute infarct, hemorrhage, mass or mass effect are seen. No new midline shift. Basal cisterns are clear. Vascular: Age advanced calcific plaque both siphons. No hyperdense vessel is seen. 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 If this is still present, it is not visible with noncontrast CT. Skull: Negative for fractures or focal lesions. Sinuses/Orbits: Negative orbits. Increased patchy opacification ethmoid air cells and increased membrane thickening both maxillary and frontal sinuses. The sphenoid sinuses clear. Again noted is Mullen diffuse right mastoid effusion and partial opacification in the right middle ear. There is increased moderate patchy left mastoid effusion with clear left middle ear cavity. Other: None. CT CERVICAL FINDINGS Alignment: There is Mullen straightened slightly reversed cervical lordosis. There is no listhesis. No widening of the anterior atlantodental joint. No C1-2 lateral mass offset. Skull base and vertebrae: No acute fracture is evident. No primary bone lesion or focal pathologic process. Soft tissues and spinal canal: No prevertebral fluid or swelling. No visible canal hematoma. There is trace calcification at the carotid bifurcations. There is no thyroid  or laryngeal mass. Unremarkable parotid and submandibular glands. Disc levels: The C5-6 disc is degenerative with small bidirectional osteophytes. The other discs are normal in heights. No significant soft tissue or bony encroachment on the thecal sac and cord is seen. There are slight facet spurring changes at most levels, uncinate spurring at  C5-6. Foraminal stenosis is only seen at C5-6 where it is mild. The other foramina appear to be patent. Small anterior endplate spurring is seen at C3-4, C4-5 and C6-7 but no bulky osteophytes or bridging bone. Other:  None. CT CHEST FINDINGS Cardiovascular: There are extensive mediastinal, anterior chest wall and paraspinal venous collateral vessels filling with dense contrast. Thrombotic occlusion in the brachiocephalic venous arch continues to be seen extending into the SVC although there may still be Mullen very small residual SVC lumen laterally. The SVC was better demonstrated on last study due to the right-sided injection on that exam. Extensive but nonocclusive thrombus was previously noted in the right axillosubclavian vein and could still be present, but this is not evaluated due to the left-sided injection today. The distal SVC and cavoatrial junction, and the cardiac chambers opacify well. Nonocclusive thrombus in the azygous vein is also again noted. Partially occlusive thrombus also appears to still be present within an engorged right internal mammary vein on series 8 images 40-54. The heart is slightly enlarged, with left ventricular and septal hypertrophy and patchy calcification of the mitral ring. There is no enlargement of the right chambers. Small pericardial effusion is again noted. There are normal caliber pulmonary arteries, adequately opacified to the segmental level with no evidence of arterial embolism, but the subsegmental  arteries largely unopacified and not evaluated. There are moderate patchy calcific plaques in the aorta and proximal great vessels skull with heavy calcifications and thickening of the aortic valve leaflets without aortic aneurysm, dissection or stenosis. There are scattered three-vessel coronary artery calcific plaques. The pulmonary veins are normal in caliber. Mediastinum/Nodes: Stable prominent right mid hilar lymph node of 1.4 cm short axis on 8:54. Right paratracheal  area is difficult to assess due to the engorged thrombosed SVC but no new abnormality is seen in the area. I do not see further adenopathy. Lungs/Pleura: Interval new small layering right pleural effusion. No left pleural effusion. Right upper lobe irregularly thick-walled cavitary lesion posteriorly again noted measuring 5.1 x 4.1 cm, not notably changed in size but with increased fluid now almost completely filling the cavity. Infectious complication not excluded. There are scarring changes, mild volume loss in the right middle lobe. Stable 7 Mullen ground-glass right upper lobe nodule on 10:66. Remaining lungs are generally clear. Upper abdomen: No acute abnormality. Musculoskeletal: No regional bone metastases. Review of the MIP images confirms the above findings. IMPRESSION: 1. No acute intracranial CT findings or depressed skull fractures. 2. Atrophy, small-vessel disease and multiple chronic lacunar infarcts. 3. Sinus disease with increased membrane thickening in the frontal and maxillary sinuses, increased patchy opacification of the ethmoid air cells. 4. Persistent right mastoid effusion and partial opacification of the right middle ear. Interval increased left mastoid effusion. 5. No evidence of cervical fractures or malalignment. 6. C5-6 degenerative disc disease and spondylosis with mild foraminal stenosis. 7. No evidence of pulmonary arterial dilatation or embolus through the segmental level. Subsegmental arteries are largely unopacified and not evaluated. 8. 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 9. Not well evaluated, there may still be nonocclusive thrombus in the right axillosubclavian vein and azygous vein, and there is partially occlusive thrombus in the right internal mammary vein. 10. Interval new small layering right pleural effusion. 11.  5.1 x 4.1 cm right upper lobe cavitary mass is now almost fully filled with fluid. Infectious complication not excluded. 12. Stable 7 Mullen ground-glass right upper lobe nodule. 13. Stable prominent right mid hilar lymph node. 14. Aortic and coronary artery atherosclerosis. Heavily calcified thickened aortic valve leaflets. Aortic Atherosclerosis (ICD10-I70.0). Electronically Signed   By: Andrea Quam M.D.   On: 11/11/2023 20:59   CT Cervical Spine Wo Contrast Result Date: 11/11/2023 CLINICAL DATA:  Clemens 2 days ago with head and neck trauma and midline neck tenderness. Reports taking blood thinners. Increased leg weakness and swelling. Pulmonary embolism suspected. Prior history of lung cancer. EXAM: CT HEAD WITHOUT CONTRAST CT CERVICAL SPINE WITHOUT CONTRAST CT CHEST, ABDOMEN AND PELVIS WITH CONTRAST TECHNIQUE: Contiguous axial images were obtained from the base of the skull through the vertex without intravenous contrast. Multidetector CT imaging of the cervical spine was performed without intravenous contrast. Multiplanar CT image reconstructions were also generated. Multidetector CT imaging of the chest was performed following the standard protocol during bolus administration of intravenous contrast. Multiplanar CT image reconstructions and MIPS were obtained to evaluate the vascular anatomy. RADIATION DOSE REDUCTION: This exam was performed according to the departmental dose-optimization program which includes automated exposure control, adjustment of the mA and/or kV according to patient size and/or use of iterative reconstruction technique. CONTRAST:  OMNIPAQUE  IOHEXOL  350 MG/ML SOLN COMPARISON:  MRI brain 10/19/2023, CTA head and neck and reconstructions  11/08/2021, soft tissue neck CT 10/18/2023, CTA chest 10/18/2023 and 10/14/2023. FINDINGS: CT HEAD FINDINGS Brain: There is moderately advanced atrophy, small-vessel disease and atrophic ventriculomegaly. Multiple tiny chronic bilateral cerebellar  lacunar infarcts are again noted, small chronic lacunar infarcts in the right thalamus and right pons, and Mullen few bilateral chronic basal ganglia lacunae are again noted. No cortical based acute infarct, hemorrhage, mass or mass effect are seen. No new midline shift. Basal cisterns are clear. Vascular: Age advanced calcific plaque both siphons. No hyperdense vessel is seen. 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 If this is still present, it is not visible with noncontrast CT. Skull: Negative for fractures or focal lesions. Sinuses/Orbits: Negative orbits. Increased patchy opacification ethmoid air cells and increased membrane thickening both maxillary and frontal sinuses. The sphenoid sinuses clear. Again noted is Mullen diffuse right mastoid effusion and partial opacification in the right middle ear. There is increased moderate patchy left mastoid effusion with clear left middle ear cavity. Other: None. CT CERVICAL FINDINGS Alignment: There is Mullen straightened slightly reversed cervical lordosis. There is no listhesis. No widening of the anterior atlantodental joint. No C1-2 lateral mass offset. Skull base and vertebrae: No acute fracture is evident. No primary bone lesion or focal pathologic process. Soft tissues and spinal canal: No prevertebral fluid or swelling. No visible canal hematoma. There is trace calcification at the carotid bifurcations. There is no thyroid  or laryngeal mass. Unremarkable parotid and submandibular glands. Disc levels: The C5-6 disc is degenerative with small bidirectional osteophytes. The other discs are normal in heights. No significant soft tissue or bony encroachment on the thecal sac and cord is seen. There are slight facet spurring changes at most levels, uncinate spurring at C5-6. Foraminal stenosis is only seen at C5-6 where it is mild. The other foramina appear to be patent. Small  anterior endplate spurring is seen at C3-4, C4-5 and C6-7 but no bulky osteophytes or bridging bone. Other:  None. CT CHEST FINDINGS Cardiovascular: There are extensive mediastinal, anterior chest wall and paraspinal venous collateral vessels filling with dense contrast. Thrombotic occlusion in the brachiocephalic venous arch continues to be seen extending into the SVC although there may still be Mullen very small residual SVC lumen laterally. The SVC was better demonstrated on last study due to the right-sided injection on that exam. Extensive but nonocclusive thrombus was previously noted in the right axillosubclavian vein and could still be present, but this is not evaluated due to the left-sided injection today. The distal SVC and cavoatrial junction, and the cardiac chambers opacify well. Nonocclusive thrombus in the azygous vein is also again noted. Partially occlusive thrombus also appears to still be present within an engorged right internal mammary vein on series 8 images 40-54. The heart is slightly enlarged, with left ventricular and septal hypertrophy and patchy calcification of the mitral ring. There is no enlargement of the right chambers. Small pericardial effusion is again noted. There are normal caliber pulmonary arteries, adequately opacified to the segmental level with no evidence of arterial embolism, but the subsegmental arteries largely unopacified and not evaluated. There are moderate patchy calcific plaques in the aorta and proximal great vessels skull with heavy calcifications and thickening of the aortic valve leaflets without aortic aneurysm, dissection or stenosis. There are scattered three-vessel coronary artery calcific plaques. The pulmonary veins are normal in caliber. Mediastinum/Nodes: Stable prominent right mid hilar lymph node of 1.4 cm short  axis on 8:54. Right paratracheal area is difficult to assess due to the engorged thrombosed SVC but no new abnormality is seen in the area. I do  not see further adenopathy. Lungs/Pleura: Interval new small layering right pleural effusion. No left pleural effusion. Right upper lobe irregularly thick-walled cavitary lesion posteriorly again noted measuring 5.1 x 4.1 cm, not notably changed in size but with increased fluid now almost completely filling the cavity. Infectious complication not excluded. There are scarring changes, mild volume loss in the right middle lobe. Stable 7 Mullen ground-glass right upper lobe nodule on 10:66. Remaining lungs are generally clear. Upper abdomen: No acute abnormality. Musculoskeletal: No regional bone metastases. Review of the MIP images confirms the above findings. IMPRESSION: 1. No acute intracranial CT findings or depressed skull fractures. 2. Atrophy, small-vessel disease and multiple chronic lacunar infarcts. 3. Sinus disease with increased membrane thickening in the frontal and maxillary sinuses, increased patchy opacification of the ethmoid air cells. 4. Persistent right mastoid effusion and partial opacification of the right middle ear. Interval increased left mastoid effusion. 5. No evidence of cervical fractures or malalignment. 6. C5-6 degenerative disc disease and spondylosis with mild foraminal stenosis. 7. No evidence of pulmonary arterial dilatation or embolus through the segmental level. Subsegmental arteries are largely unopacified and not evaluated. 8. 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 9. Not well evaluated, there may still be nonocclusive thrombus in the right axillosubclavian vein and azygous vein, and there is partially occlusive thrombus in the right internal mammary vein. 10. Interval new small layering right pleural effusion. 11. 5.1 x 4.1 cm right upper lobe cavitary mass is now almost fully filled with fluid. Infectious complication not  excluded. 12. Stable 7 Mullen ground-glass right upper lobe nodule. 13. Stable prominent right mid hilar lymph node. 14. Aortic and coronary artery atherosclerosis. Heavily calcified thickened aortic valve leaflets. Aortic Atherosclerosis (ICD10-I70.0). Electronically Signed   By: Andrea Quam M.D.   On: 11/11/2023 20:59   US  Venous Img Lower Right (DVT Study) Result Date: 11/11/2023 EXAM: ULTRASOUND DUPLEX OF THE RIGHT LOWER EXTREMITY VEINS TECHNIQUE: Duplex ultrasound using B-mode/gray scaled imaging and Doppler spectral analysis and color flow was obtained of the deep venous structures of the right lower extremity. COMPARISON: None. CLINICAL HISTORY: Swelling, lung cancer. FINDINGS: The common femoral vein, femoral vein, popliteal vein, and posterior tibial vein of the right lower extremity demonstrate normal compressibility with normal color flow and spectral analysis. IMPRESSION: 1. No evidence of DVT. Electronically signed by: Andrea Daring Mullen 11/11/2023 07:10 PM EDT RP Workstation: [id number]   CT ABDOMEN PELVIS W CONTRAST Result Date: 10/25/2023 EXAM: CT ABDOMEN AND PELVIS WITH CONTRAST 10/25/2023 06:32:57 PM TECHNIQUE: CT of the abdomen and pelvis was performed with the administration of intravenous contrast. Multiplanar reformatted images are provided for review. Automated exposure control, iterative reconstruction, and/or weight-based adjustment of the mA/kV was utilized to reduce the radiation dose to as low as reasonably achievable. COMPARISON: None available. CLINICAL HISTORY: Metastatic disease evaluation. History of lung cancer. Tracking code: Bo. FINDINGS: LOWER CHEST: Visualized lung bases are clear. LIVER: The liver is unremarkable. GALLBLADDER AND BILE DUCTS: Gallbladder is unremarkable. No biliary ductal dilatation. SPLEEN: No acute abnormality. PANCREAS: No acute abnormality. ADRENAL GLANDS: No acute abnormality. KIDNEYS, URETERS AND BLADDER: No stones in the kidneys or ureters. No  hydronephrosis. No perinephric or periureteral stranding. Urinary bladder is unremarkable. GI AND  BOWEL: Moderate descending and sigmoid colonic diverticulosis. The stomach, small bowel, and large bowel are otherwise unremarkable. Normal appendix. PERITONEUM AND RETROPERITONEUM: No ascites. No free air. VASCULATURE: Mild aortoiliac atherosclerotic calcification. Extensive calcification of the aortic valve leaflets. Suspected bicuspid aortic valve. Calcification of the mitral valve leaflets. Global cardiac size within normal limits. Echocardiography may be helpful to better assess the aortic valve morphology and the degree of valvular dysfunction. LYMPH NODES: No lymphadenopathy. REPRODUCTIVE ORGANS: Partially calcified uterine fibroids result in Mullen lobulated morphology of the uterus. No adnexal masses are seen. BONES AND SOFT TISSUES: Osseous structures are age appropriate. No acute bone abnormality. No lytic or blastic bone lesion. 4.2 cm rounded subdermal lesion is seen within the left gluteal region which may represent Mullen sebaceous cyst, but is not well characterized on this examination. Umbilical/ventral hernia repair with mesh has been performed. No recurrent abdominal wall hernia. RAF SCORE: Aortic atherosclerosis (ICD10-I70.0) IMPRESSION: 1. No evidence of metastatic disease within the abdomen and pelvis. 2. Moderate descending and sigmoid colonic diverticulosis without evidence of diverticulitis. 3. Mild aortoiliac atherosclerotic calcification. Electronically signed by: Andrea Molt Mullen 10/25/2023 09:28 PM EDT RP Workstation: [id number]   DG CHEST PORT 1 VIEW Result Date: 10/24/2023 CLINICAL DATA:  Shortness of breath. EXAM: PORTABLE CHEST 1 VIEW COMPARISON:  Chest radiograph dated 10/22/2023. FINDINGS: Cavitary mass in the right upper lobe measures 5.2 cm in diameter. No new consolidation. There is no pleural effusion pneumothorax. The cardiac silhouette is within normal limits. No acute osseous  pathology. IMPRESSION: Cavitary mass in the right upper lobe. Electronically Signed   By: Vanetta Chou M.D.   On: 10/24/2023 13:50   DG CHEST PORT 1 VIEW Result Date: 10/22/2023 CLINICAL DATA:  Status post bronchoscopy EXAM: PORTABLE CHEST 1 VIEW COMPARISON:  October 18, 2023 FINDINGS: Redemonstration of cavitary right upper lobe lung mass. No new consolidations. No pleural effusions. No pneumothorax. Unchanged cardiomediastinal silhouette with known right paratracheal lymphadenopathy. No acute osseous findings. IMPRESSION: Unchanged right upper lobe cavitary lung mass and right paratracheal lymphadenopathy. No acute findings. Electronically Signed   By: Michaeline Blanch M.D.   On: 10/22/2023 11:58   ECHOCARDIOGRAM COMPLETE Result Date: 10/20/2023    ECHOCARDIOGRAM REPORT   Patient Name:   Andrea Mullen Date of Exam: 10/20/2023 Mullen Rec #:  [id number]        Height:       65.0 in Accession #:    [id number]       Weight:       216.3 lb Date of Birth:  [birth year]        BSA:          2.045 m Patient Age:    57 years         BP:           104/73 mmHg Patient Gender: F                HR:           105 bpm. Exam Location:  Inpatient Procedure: 2D Echo, Cardiac Doppler and Color Doppler (Both Spectral and Color            Flow Doppler were utilized during procedure). Indications:    Pulmonary Embolus  History:        Patient has prior history of Echocardiogram examinations, most                 recent 11/09/2021. CAD, Stroke, Signs/Symptoms:Chest Pain; Risk  Factors:Hypertension and Current Smoker.  Sonographer:    Juliene Rucks Referring Phys: [phone] Andrea Mullen SMITH IMPRESSIONS  1. Left ventricular ejection fraction, by estimation, is 60 to 65%. The left ventricle has normal function. The left ventricle has no regional wall motion abnormalities. Left ventricular diastolic parameters are consistent with Grade I diastolic dysfunction (impaired relaxation). Elevated left ventricular end-diastolic  pressure.  2. Right ventricular systolic function was not well visualized. The right ventricular size is not well visualized.  3. The mitral valve is degenerative. Trivial mitral valve regurgitation. Moderate to severe mitral annular calcification.  4. The aortic valve was not adequately interrogated to determine stenosis - there is calcification of the valve and aortic root. Image 67 demonstrates an aortic valve gradient of at least 30 mmHg peak and 14 mmHg, mean.. The aortic valve has an indeterminant number of cusps. There is severe calcifcation of the aortic valve. Aortic valve regurgitation is trivial. Moderate to severe aortic valve stenosis.  5. The inferior vena cava is normal in size with greater than 50% respiratory variability, suggesting right atrial pressure of 3 mmHg. Comparison(s): Changes from prior study are noted. 11/09/2021: LVEF 60-65%, severe AS - possibly bicuspid aortic valve. Conclusion(s)/Recommendation(s): Consider further evaluation of the aortic valve with TEE. FINDINGS  Left Ventricle: Left ventricular ejection fraction, by estimation, is 60 to 65%. The left ventricle has normal function. The left ventricle has no regional wall motion abnormalities. The left ventricular internal cavity size was normal in size. There is  no left ventricular hypertrophy. Left ventricular diastolic parameters are consistent with Grade I diastolic dysfunction (impaired relaxation). Elevated left ventricular end-diastolic pressure. Right Ventricle: The right ventricular size is not well visualized. Right vetricular wall thickness was not well visualized. Right ventricular systolic function was not well visualized. Left Atrium: Left atrial size was normal in size. Right Atrium: Right atrial size was normal in size. Pericardium: There is no evidence of pericardial effusion. Mitral Valve: The mitral valve is degenerative in appearance. Moderate to severe mitral annular calcification. Trivial mitral valve  regurgitation. Tricuspid Valve: The tricuspid valve is not well visualized. Tricuspid valve regurgitation is not demonstrated. Aortic Valve: The aortic valve was not adequately interrogated to determine stenosis - there is calcification of the valve and aortic root. Image 67 demonstrates an aortic valve gradient of at least 30 mmHg peak and 14 mmHg, mean. The aortic valve has an  indeterminant number of cusps. There is severe calcifcation of the aortic valve. There is moderate aortic valve annular calcification. Aortic valve regurgitation is trivial. Moderate to severe aortic stenosis is present. Pulmonic Valve: The pulmonic valve was not well visualized. Pulmonic valve regurgitation is not visualized. Aorta: The aortic root and ascending aorta are structurally normal, with no evidence of dilitation. Venous: The inferior vena cava is normal in size with greater than 50% respiratory variability, suggesting right atrial pressure of 3 mmHg. IAS/Shunts: The interatrial septum was not well visualized.  LEFT VENTRICLE PLAX 2D LVIDd:         5.20 cm   Diastology LVIDs:         4.60 cm   LV e' medial:    4.24 cm/s LV PW:         0.80 cm   LV E/e' medial:  30.2 LV IVS:        0.90 cm   LV e' lateral:   7.07 cm/s LVOT diam:     1.70 cm   LV E/e' lateral: 18.1 LVOT Area:  2.27 cm  RIGHT VENTRICLE RV S prime:     5.34 cm/s LEFT ATRIUM           Index LA diam:      3.00 cm 1.47 cm/m LA Vol (A2C): 48.1 ml 23.52 ml/m LA Vol (A4C): 40.9 ml 20.00 ml/m   AORTA Ao Root diam: 2.60 cm MITRAL VALVE MV Area (PHT): 4.39 cm     SHUNTS MV Decel Time: 173 msec     Systemic Diam: 1.70 cm MV E velocity: 128.00 cm/s MV Mullen velocity: 162.00 cm/s MV E/Mullen ratio:  0.79 Andrea Mullen Electronically signed by Andrea Mullen Signature Date/Time: 10/20/2023/6:31:01 PM    Final    VAS US  LOWER EXTREMITY VENOUS (DVT) Result Date: 10/20/2023  Lower Venous DVT Study Patient Name:  Andrea Mullen  Date of Exam:   10/20/2023 Mullen Rec #:  [id number]         Accession #:    [id number] Date of Birth: Nov 11, 1965         Patient Gender: F Patient Age:   6 years Exam Location:  [facility] Procedure:      VAS US  LOWER EXTREMITY VENOUS (DVT) Referring Phys: MAXIMINO SHARPS --------------------------------------------------------------------------------  Indications: Pulmonary embolism.  Performing Technologist: Elmarie Lindau, RVT  Examination Guidelines: Mullen complete evaluation includes B-mode imaging, spectral Doppler, color Doppler, and power Doppler as needed of all accessible portions of each vessel. Bilateral testing is considered an integral part of Mullen complete examination. Limited examinations for reoccurring indications may be performed as noted. The reflux portion of the exam is performed with the patient in reverse Trendelenburg.  +---------+---------------+---------+-----------+----------+--------------+ RIGHT    CompressibilityPhasicitySpontaneityPropertiesThrombus Aging +---------+---------------+---------+-----------+----------+--------------+ CFV      Full           Yes      Yes                                 +---------+---------------+---------+-----------+----------+--------------+ SFJ      Full                                                        +---------+---------------+---------+-----------+----------+--------------+ FV Prox  Full                                                        +---------+---------------+---------+-----------+----------+--------------+ FV Mid   Full                                                        +---------+---------------+---------+-----------+----------+--------------+ FV DistalFull                                                        +---------+---------------+---------+-----------+----------+--------------+ PFV      Full                                                         +---------+---------------+---------+-----------+----------+--------------+  POP      Partial                                      Chronic        +---------+---------------+---------+-----------+----------+--------------+ PTV      Full                                                        +---------+---------------+---------+-----------+----------+--------------+ PERO     Full                                                        +---------+---------------+---------+-----------+----------+--------------+   +---------+---------------+---------+-----------+----------+--------------+ LEFT     CompressibilityPhasicitySpontaneityPropertiesThrombus Aging +---------+---------------+---------+-----------+----------+--------------+ CFV      Full           Yes      Yes                                 +---------+---------------+---------+-----------+----------+--------------+ SFJ      Full                                                        +---------+---------------+---------+-----------+----------+--------------+ FV Prox  Full                                                        +---------+---------------+---------+-----------+----------+--------------+ FV Mid   Full                                                        +---------+---------------+---------+-----------+----------+--------------+ FV DistalFull                                                        +---------+---------------+---------+-----------+----------+--------------+ PFV      Full                                                        +---------+---------------+---------+-----------+----------+--------------+ POP      Partial                                      Chronic        +---------+---------------+---------+-----------+----------+--------------+  PTV      Full                                                         +---------+---------------+---------+-----------+----------+--------------+ PERO     Full                                                        +---------+---------------+---------+-----------+----------+--------------+     Summary: RIGHT: - Findings consistent 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  - No cystic structure found in the popliteal fossa.  LEFT: - Findings consistent with chronic deep vein thrombosis involving the left popliteal vein.  - No cystic structure found in the popliteal fossa.  *See table(s) above for measurements and observations. Electronically signed by Lonni Gaskins Mullen on 10/20/2023 at 2:53:16 PM.    Final    MR Brain W and Wo Contrast Result Date: 10/19/2023 CLINICAL DATA:  Initial evaluation for dural venous sinus thrombosis, acute neuro deficit. EXAM: MRI HEAD WITHOUT AND WITH CONTRAST TECHNIQUE: Multiplanar, multiecho pulse sequences of the brain and surrounding structures were obtained without and with intravenous contrast. CONTRAST:  10mL GADAVIST  GADOBUTROL  1 MMOL/ML IV SOLN COMPARISON:  Comparison made with CT from earlier the same day as well as multiple previous exams. FINDINGS: Brain: Diffuse prominence of the CSF containing spaces compatible generalized cerebral atrophy. Patchy and confluent T2/FLAIR hyperintensity involving the periventricular deep white matter both cerebral hemispheres, consistent with chronic microvascular ischemic disease, moderately advanced in nature. Mild patchy involvement of the pons and cerebellum noted. Few remote lacunar infarcts noted about the bilateral basal ganglia. Remote right pontine infarct with Mullen few scattered small remote bilateral cerebellar infarcts. 6 Mullen acute ischemic nonhemorrhagic infarcts seen involving the right splenium (series 5, image 75). No other evidence for acute or subacute ischemia. Gray-white matter differentiation otherwise maintained. No acute intracranial hemorrhage. Multiple  scattered chronic micro hemorrhages noted, likely hypertensive in nature. No mass lesion, midline shift, or mass effect. Mild ventricular prominence related to global parenchymal volume loss of hydrocephalus. No extra-axial fluid collection. Pituitary gland and suprasellar region within normal limits. There is an apparent 6 Mullen focus of enhancement involving the right pons (series 17, image 14). This is at the location of an underlying right pontine infarct as well as Mullen few prominent chronic micro hemorrhages. Given this, finding is suspected to reflect enhancement related to Mullen small of all vein subacute infarct or possibly artifact related to the adjacent microhemorrhage. No other abnormal enhancement elsewhere within the brain. Vascular: Major intracranial arterial vascular flow voids are maintained. Previously identified dural venous sinus thrombosis involving the right transverse and sigmoid sinuses with extension into the right jugular bulb and visualized proximal right internal jugular vein, corresponding with findings on recent exams. Overall, appearance is relatively similar. Skull and upper cervical spine: Craniocervical junction within normal limits. Heterogeneous and mildly decreased T1 signal intensity within the visualized bone marrow, nonspecific, but most commonly related to anemia, smoking or obesity. No scalp soft tissue abnormality. Sinuses/Orbits: Globes orbital soft tissues within normal limits. Mild chronic mucosal thickening noted about the paranasal sinuses. Moderate to large right mastoid and middle  ear effusion. Trace left mastoid effusion noted. Other: None. IMPRESSION: 1. 6 Mullen acute ischemic nonhemorrhagic infarct involving the right splenium. 2. 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 Overall, appearance is relatively stable from prior. 3. 6 Mullen  focus of enhancement involving the right pons, favored to reflect enhancement related to Mullen small subacute infarct or possibly artifact related to the adjacent microhemorrhage. Short interval follow-up MRI in 3 months suggested to ensure resolution and/or stability. 4. Underlying age-related cerebral atrophy with moderately advanced chronic microvascular ischemic disease, with Mullen few scattered remote lacunar infarcts involving the bilateral basal ganglia, right pons, and bilateral cerebellar hemispheres. 5. Multiple scattered chronic micro hemorrhages, likely hypertensive in nature. Electronically Signed   By: Andrea Hoard M.D.   On: 10/19/2023 23:23   CT VENOGRAM HEAD Result Date: 10/19/2023 CLINICAL DATA:  58 year old female status post fall. Pain. Prior stroke. Left side weakness and numbness. History of lung cancer. Head and neck CT CT with contrast yesterday demonstrating central venous thrombosis, right IJ thrombosis involving the right transverse and sigmoid sinus also. EXAM: CT VENOGRAM HEAD TECHNIQUE: Venographic phase images of the brain were obtained following the administration of intravenous contrast. Multiplanar reformats and maximum intensity projections were generated. RADIATION DOSE REDUCTION: This exam was performed according to the departmental dose-optimization program which includes automated exposure control, adjustment of the mA and/or kV according to patient size and/or use of iterative reconstruction technique. CONTRAST:  75mL OMNIPAQUE  IOHEXOL  300 MG/ML  SOLN COMPARISON:  Head and neck CT yesterday.  Brain MRI 11/09/2021. FINDINGS: CT HEAD Post-contrast only. Stable cerebral volume. Chronic lacunar infarcts in the bilateral brain. No evidence of intracranial mass effect, acute ventriculomegaly, acute intracranial hemorrhage, acute cerebral edema. CTV HEAD Venous sinuses: Maintained enhancement and patency of the superior sagittal sinus, torcula, straight sinus, inferior sagittal  sinus, internal cerebral veins. Maintained enhancement and patency of the left transverse, left sigmoid venous sinus, and visible left IJ bulb. Cavernous sinus enhancement appears symmetric and within normal limits also. Nonenhancing, thrombosed right IJ bulb, right sigmoid venous sinus, and distal half of the right transverse sinus (beginning on series 2, image 25. The medial right transverse sinus remains enhancing. The appearance is unchanged from head and neck CT yesterday. Anatomic variants: None significant. Other findings: Calcified atherosclerosis at the skull base. Review of the MIP images confirms the above findings IMPRESSION: 1. Stable Thrombosis of the right IJ, right sigmoid sinus, and lateral half of the right transverse dural venous sinus since the CT Head and Neck yesterday. 2. No other intracranial venous thrombosis identified. No intracranial mass effect, hemorrhage, or acute cerebral edema identified. Electronically Signed   By: Andrea Hurst M.D.   On: 10/19/2023 08:20   CT Soft Tissue Neck W Contrast Result Date: 10/18/2023 CLINICAL DATA:  Initial evaluation for acute facial swelling. EXAM: CT NECK WITH CONTRAST TECHNIQUE: Multidetector CT imaging of the neck was performed using the standard protocol following the bolus administration of intravenous contrast. RADIATION DOSE REDUCTION: This exam was performed according to the departmental dose-optimization program which includes automated exposure control, adjustment of the mA and/or kV according to patient size and/or use of iterative reconstruction technique. CONTRAST:  OMNIPAQUE  IOHEXOL  350 MG/ML SOLN COMPARISON:  None Available. FINDINGS: Pharynx and larynx: Oral cavity within normal limits. Oropharynx and nasopharynx within normal limits. No retropharyngeal collection or swelling. Negative epiglottis. Hypopharynx and supraglottic  larynx within normal limits. Negative glottis. Subglottic airway clear. Salivary glands: Salivary glands  including the parotid and submandibular glands are within normal limits. Thyroid : Normal. Lymph nodes: No enlarged or pathologic lymph nodes within the neck. Vascular: Occlusive thrombus seen involving the brachiocephalic vein, extending into the visualized SVC. The vessels are expanded with hazy inflammatory stranding, consistent with acute thrombus. Clot extends to partially involve the right greater than left subclavian veins as well as the right axillary vein. Cephalad extension with subocclusive thrombus extending throughout the right internal jugular vein, with extension into the cranial vault to involve the right transverse and sigmoid sinuses. Left IJ remains patent. Multiple prominent venous collaterals noted within the right neck and visualized right chest wall. Normal arterial enhancement seen throughout the neck. Aortic atherosclerosis noted. Limited intracranial: No acute finding. Visualized orbits: No acute finding. Mastoids and visualized paranasal sinuses: Mild mucoperiosteal thickening about the ethmoidal air cells and maxillary sinuses. Right mastoid and middle ear effusion. Left mastoid air cells are clear. Skeleton: No worrisome osseous lesions. Upper chest: Approximate 5 cm thick walled cavitary mass at the posterior right upper lobe, partially visualize, better evaluated on concomitant CT of the chest. Other: None. IMPRESSION: 1. Acute occlusive and subocclusive thrombus involving the brachiocephalic vein, extending into the visualized SVC. Clot extends to partially involve the right greater than left subclavian veins as well as the right axillary vein. Cephalad extension with subocclusive thrombus extending throughout the right internal jugular vein, with extension into the cranial vault to involve the right transverse and sigmoid sinuses. 2. Approximate 5 cm thick walled cavitary mass at the posterior right upper lobe, partially visualized, better evaluated on concomitant CT of the chest. 3.  Right mastoid and middle ear effusion, of uncertain significance. Correlation with physical exam recommended. 4.  Aortic Atherosclerosis (ICD10-I70.0). Electronically Signed   By: Andrea Hoard M.D.   On: 10/18/2023 19:21   CT Head W or Wo Contrast Result Date: 10/18/2023 CLINICAL DATA:  Initial evaluation for acute headache, history of malignancy. EXAM: CT HEAD WITHOUT AND WITH CONTRAST TECHNIQUE: Contiguous axial images were obtained from the base of the skull through the vertex without and with intravenous contrast. RADIATION DOSE REDUCTION: This exam was performed according to the departmental dose-optimization program which includes automated exposure control, adjustment of the mA and/or kV according to patient size and/or use of iterative reconstruction technique. CONTRAST:  OMNIPAQUE  IOHEXOL  350 MG/ML SOLN COMPARISON:  Prior CT from 09/20/2023 FINDINGS: Brain: Generalized age-related cerebral atrophy. Patchy and confluent hypodensity involving the supratentorial cerebral white matter, consistent with chronic small vessel ischemic disease, moderate in nature. Multiple scattered remote lacunar infarcts noted about the bilateral basal ganglia right thalamus, stable. No acute intracranial hemorrhage. No acute large vessel territory infarct. No mass lesion or midline shift. No hydrocephalus or extra-axial fluid collection. Vascular: No abnormal hyperdense vessel seen prior to contrast administration. Calcified atherosclerosis present at the skull base. Following contrast administration, filling defect within the right transverse and sigmoid sinuses extending into the right jugular bulb and visualized proximal right internal jugular vein, consistent with dural venous sinus thrombosis. This appears to extend inferiorly throughout the right IJ within the neck. Skull: Scalp soft tissues demonstrate no acute finding. Calvarium intact. Sinuses/Orbits: Globes orbital soft tissues within normal limits. Mild  mucoperiosteal thickening present about the paranasal sinuses. Moderate right mastoid and middle ear effusion. Left mastoid air cells are clear. Other: None. IMPRESSION: 1. Filling defect within the right transverse and sigmoid sinuses extending into the  right jugular bulb and visualized proximal right internal jugular vein, consistent with dural venous sinus thrombosis. This extends inferiorly throughout the right IJ within the neck. 2. No other acute intracranial abnormality. 3. Moderate right mastoid and middle ear effusion, of uncertain significance. Correlation with physical exam recommended. 4. Underlying age-related cerebral atrophy with moderate chronic small vessel ischemic disease, with multiple remote lacunar infarcts about the bilateral basal ganglia and right thalamus. Electronically Signed   By: Andrea Hoard M.D.   On: 10/18/2023 19:11   CT Angio Chest PE W and/or Wo Contrast Addendum Date: 10/18/2023 ADDENDUM REPORT: 10/18/2023 18:55 ADDENDUM: Results were discussed with Dr. Dreama at 6:41 p.m. [place] on October 18, 2023. Electronically Signed   By: Suzen Dials M.D.   On: 10/18/2023 18:55   Result Date: 10/18/2023 CLINICAL DATA:  History of lung cancer presenting with chest tightness. EXAM: CT ANGIOGRAPHY CHEST WITH CONTRAST TECHNIQUE: Multidetector CT imaging of the chest was performed using the standard protocol during bolus administration of intravenous contrast. Multiplanar CT image reconstructions and MIPs were obtained to evaluate the vascular anatomy. RADIATION DOSE REDUCTION: This exam was performed according to the departmental dose-optimization program which includes automated exposure control, adjustment of the mA and/or kV according to patient size and/or use of iterative reconstruction technique. CONTRAST:  OMNIPAQUE  IOHEXOL  350 MG/ML SOLN COMPARISON:  October 14, 2023 FINDINGS: Cardiovascular: There is marked severity calcification of the aortic arch without  evidence of aortic aneurysm or dissection. The segmental and subsegmental pulmonary arteries are limited in evaluation secondary to suboptimal opacification with intravenous contrast. As result, pulmonary embolism within the segmental branches of the right lower lobe cannot be excluded (axial CT images 132 through 142, CT series 6). Occlusive thrombus is noted within the brachiocephalic vein. This is present on the prior study. Nonocclusive thrombus is seen within the superior vena cava (axial CT images 39 through 45, CT series 5). Nonocclusive thrombus is also noted within the azygous vein (axial CT images 44 through 78, CT series 5). Mullen partially thrombosed venous structure is seen within the anteromedial aspect of the upper right lung (axial CT image 44 through 53, CT series 5). Extensive, nonocclusive thrombus is noted within the right axillary vein and right subclavian vein (axial CT images 5 through 52, CT series 5) Numerous thin, tortuous, enhancing venous structures are seen within the anterior and posterior aspects of the chest wall on the right. Normal heart size with mild to moderate severity coronary artery calcification and marked severity calcification of the mitral annulus. No pericardial effusion. Mediastinum/Nodes: There is mild right hilar lymphadenopathy. Thyroid  gland, trachea, and esophagus demonstrate no significant findings. Lungs/Pleura: Mullen 4.4 cm x 4.8 cm x 4.6 cm thick walled cavitary lesion is again seen within the posterior aspect of the right upper lobe. Mild anteromedial right middle lobe linear atelectasis is noted. No pleural effusion or pneumothorax identified. Upper Abdomen: There is Mullen small hiatal hernia. Diffuse fatty infiltration of the liver parenchyma is noted. Noninflamed diverticula are seen throughout the visualized portions of the large bowel. Musculoskeletal: No chest wall abnormality. No acute or significant osseous findings. Review of the MIP images confirms the above  findings. IMPRESSION: 1. Limited evaluation of the segmental and subsegmental pulmonary arteries secondary to suboptimal opacification with intravenous contrast. As result, pulmonary embolism within the segmental branches of the right lower lobe cannot be excluded. 2. Occlusive thrombus within the brachiocephalic vein, nonocclusive thrombus within the superior vena cava and azygous vein, and extensive, nonocclusive thrombus within  the right axillary vein and right subclavian vein. 3. 4.4 cm x 4.8 cm x 4.6 cm thick walled cavitary lesion within the posterior aspect of the right upper lobe which is seen on the prior study and may be consistent with the patient's history of lung cancer. 4. Mild right hilar lymphadenopathy. 5. Small hiatal hernia. 6. Hepatic steatosis. 7. Colonic diverticulosis. 8. Aortic atherosclerosis. Electronically Signed: By: Suzen Dials M.D. On: 10/18/2023 18:52   DG Chest 2 View Result Date: 10/18/2023 CLINICAL DATA:  Chest pain EXAM: CHEST - 2 VIEW COMPARISON:  Chest radiograph October 14, 2023, CT angio chest October 14, 2023 FINDINGS: Thick-walled cavitary lesion with air-fluid level in right upper lobe, measuring approximately 5 cm. No new lesion or consolidation identified. Cardiomediastinal silhouette is enlarged. Fullness of right paratracheal correlating to mediastinal lymphadenopathy. No pleural effusion or pneumothorax. Multilevel degenerative changes of the spine. Nodular opacity superimposing lower thoracic spine on lateral view correlates to osteophytes better assessed on recent CT angio chest. IMPRESSION: Thick-walled cavitary lesion of right upper lobe. Right lower paratracheal mediastinal lymphadenopathy. Electronically Signed   By: Megan  Zare M.D.   On: 10/18/2023 14:34   CT Angio Chest PE W and/or Wo Contrast Result Date: 10/14/2023 CLINICAL DATA:  Increased shortness of breath, history of lung cancer in 2008 normal pulmonary embolism suspected, positive D-dimer  EXAM: CT ANGIOGRAPHY CHEST WITH CONTRAST TECHNIQUE: Multidetector CT imaging of the chest was performed using the standard protocol during bolus administration of intravenous contrast. Multiplanar CT image reconstructions and MIPs were obtained to evaluate the vascular anatomy. RADIATION DOSE REDUCTION: This exam was performed according to the departmental dose-optimization program which includes automated exposure control, adjustment of the mA and/or kV according to patient size and/or use of iterative reconstruction technique. CONTRAST:  75mL OMNIPAQUE  IOHEXOL  350 MG/ML SOLN COMPARISON:  Same day chest radiograph, November 09, 2021 CT FINDINGS: Cardiovascular: Satisfactory opacification of the pulmonary arteries to the segmental level. No evidence of pulmonary embolism. No pericardial effusion. Nonspecific narrowing of left brachiocephalic vein and upper SVC due to mediastinal lymphadenopathy with venous reflux into the left chest wall collaterals. Mediastinum/Nodes: Bulky right paratracheal lymphadenopathy, measuring up to 3.1 x 2.5 cm there are also some mildly prominent, subcentimeter bilateral supraclavicular lymph nodes. Lungs/Pleura: Thick-walled, cavitary right upper lobe mass measuring approximately 5.7 x 5.3 cm. There was Mullen small pulmonary nodule at this location on prior CT performed November 09, 2021. No pleural effusions. No pneumothorax. Upper Abdomen: No acute findings. Musculoskeletal: No acute osseous findings. Review of the MIP images confirms the above findings. IMPRESSION: 1. Cavitary right upper lobe mass and bulky mediastinal lymphadenopathy, concerning for primary lung malignancy. Tissue biopsy may be helpful to confirm diagnosis and exclude possible mycobacterial infection. 2. Nonspecific narrowing of upper SVC and brachiocephalic veins due to mediastinal lymphadenopathy. 3. Nonspecific, mildly prominent subcentimeter bilateral supraclavicular lymph nodes, felt to be reactive related to the  central venous stenosis, but difficult to exclude metastatic disease. Electronically Signed   By: Michaeline Blanch M.D.   On: 10/14/2023 12:49   DG Chest 2 View Result Date: 10/14/2023 CLINICAL DATA:  SOB EXAM: CHEST - 2 VIEW COMPARISON:  January 29, 2022 FINDINGS: Thick-walled cavity in the right upper lobe, measuring 5.5 cm. No lobar consolidation, pleural effusion, or pneumothorax. Fullness of the right suprahilar region. No cardiomegaly. Aortic atherosclerosis. No acute fracture or destructive lesions. Multilevel thoracic osteophytosis. IMPRESSION: 1. Thick-walled cavity in the right upper lobe, measuring 5.5 cm. This may represent either TB or neoplasm.  Appropriate isolation precautions. 2. Fullness of the right suprahilar region is also present, worrisome for lymphadenopathy. Mullen follow-up chest CT with IV contrast recommended for further characterization. Critical Value/emergent results were called by telephone at the time of interpretation on 10/14/2023 at 8:36 am to provider ANDREW TEE , who verbally acknowledged these results. Electronically Signed   By: Rogelia Myers M.D.   On: 10/14/2023 08:37      IMPRESSION/PLAN:***    On date of service, in total, I spent *** minutes on this encounter. Patient was seen in person.   __________________________________________   Lauraine Golden, Mullen  This document serves as Mullen record of services personally performed by Lauraine Golden, Mullen. It was created on her behalf by Dorthy Fuse, Mullen trained Mullen scribe. The creation of this record is based on the scribe's personal observations and the provider's statements to them. This document has been checked and approved by the attending provider.

## 2023-11-12 NOTE — Assessment & Plan Note (Addendum)
 History of tobacco abuse and cocaine  abuse Last used cocaine  2 years ago Has not smoked in 1 month  UDS pending

## 2023-11-12 NOTE — H&P (Signed)
 History and Physical    Patient: Andrea Mullen FMW:[id number] DOB: 1965/06/26 DOA: 11/11/2023 DOS: the patient was seen and examined on 11/12/2023 PCP: [facility]  Patient coming from: [facility] - lives with her daughter. She uses walker.    Chief Complaint: weakness and falls   HPI: Andrea Mullen is a 58 y.o. 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 She states she has fallen 2x, did not hit head. Thinks it is from weakness. She states she is eating and drinking okay. She is just tired, no pain. No fever, but states she is always cold. She has a little cough that is dry. Cough is normal for her. Denies any hemoptysis. Denies shortness of breath.   Her daughter states she has fallen twice and has refused EMS. She was crawling on the floor. She has been more confused, especially waking up. She won't even know who her daughter is. She did hit her head on the wall. She also states she has worse weakness in her legs which was new since coming home from hospital. She actually reports a change in appetite with decreased PO intake over the past 2 days.   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 Neurology and vascular surgery were consulted. She was also found to have right sided cavitary lesion and underwent bronchoscopy. She completed 10 days of antibiotics. She also had lymph node aspiration which was consistent with non small cell carcinoma, consistent with squamous cell carcinoma. She was transitioned to eliquis .    Denies any fever/chills, vision changes/headaches, chest pain or palpitations, shortness of breath, abdominal pain, N/V/D, dysuria or leg swelling.    She does not smoke anymore, has not smoked in a  month. No alcohol use.   ER Course:  vitals: afebrile, bp: 102/80, HR: 119, RR: 40 oxygen: 99%RA Pertinent labs: hgb: 10.7 CT head: No acute intracranial finding. Atrophy, small vessel disease and multiple chronic lacunar infarcts. Sinus disease with increased membrane thickening in the frontal and maxillary sinuses, increased patchy opacification of the ethmoid air cells. Persistent right mastoid effusion and partial opacification of the right middle ear.  Cervical CT: No fractures. DDD at C5-C6.  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 Not well evaluated, there may still be nonocclusive thrombus in the right axillosubclavian vein and azygous vein, and there is partially occlusive thrombus in the right internal mammary vein. 10. Interval new small layering right pleural effusion. 11. 5.1 x 4.1 cm right upper lobe cavitary mass is now almost fully filled with fluid. Infectious complication not excluded. 12. Stable 7 mm ground-glass right upper lobe nodule. 13. Stable prominent right mid hilar lymph node. 14. Aortic and coronary artery atherosclerosis. Heavily calcified thickened aortic valve leaflets.  Right US  RLE: No evidence of DVT.  In ED: given 1L IVF bolus, rocephin  and azithromycin . [facility] asked to admit.   Review of Systems: As mentioned in the history of present illness. All other systems reviewed and are negative. Past Medical History:  Diagnosis Date   Anxiety    Asthma    Blind    left eye   Cancer (HCC)  lung, tx radiation and chemo   Hyperlipemia    Hypertension    Stroke [facility])    Past Surgical History:  Procedure Laterality Date   CARDIAC CATHETERIZATION N/A 11/01/2014   Procedure: Left Heart Cath and Coronary Angiography;  Surgeon: Victory LELON Sharps, MD;  Location: [facility] INVASIVE CV LAB;  Service:  Cardiovascular;  Laterality: N/A;   CESAREAN SECTION     x3   HERNIA REPAIR     VIDEO BRONCHOSCOPY WITH ENDOBRONCHIAL ULTRASOUND Right 10/22/2023   Procedure: BRONCHOSCOPY, WITH EBUS;  Surgeon: Jude Harden GAILS, MD;  Location: [facility] ENDOSCOPY;  Service: Cardiopulmonary;  Laterality: Right;   Social History:  reports that she has been smoking cigarettes. She has never used smokeless tobacco. She reports that she does not currently use alcohol. She reports that she does not currently use drugs after having used the following drugs: Marijuana and Cocaine .  Allergies  Allergen Reactions   Bicillin C-R Hives   Penicillins Hives        Nsaids Hives   Prednisone  Other (See Comments)    Change in mental status     Family History  Problem Relation Age of Onset   Clotting disorder Mother     Prior to Admission medications   Medication Sig Start Date End Date Taking? Authorizing Provider  albuterol  (PROVENTIL ) (2.5 MG/3ML) 0.083% nebulizer solution Take 3 mLs (2.5 mg total) by nebulization every 6 (six) hours as needed for wheezing or shortness of breath. Patient not taking: Reported on 10/19/2023 10/14/23   Ula Prentice SAUNDERS, MD  albuterol  (VENTOLIN  HFA) 108 [id number] Base) MCG/ACT inhaler Inhale 2 puffs into the lungs every 4 (four) hours as needed for wheezing or shortness of breath. 10/14/23   Ula Prentice SAUNDERS, MD  apixaban  (ELIQUIS ) 5 MG TABS tablet Take 1 tablet (5 mg total) by mouth 2 (two) times daily. 10/27/23 11/26/23  Amin, Burgess BROCKS, MD  butalbital -acetaminophen -caffeine  (FIORICET) 50-325-40 MG tablet Take 1-2 tablets by mouth every 6 (six) hours as needed for headache. Patient not taking: Reported on 10/19/2023 02/14/23 02/14/24  Juleen Rush, PA-C  cyclobenzaprine  (FLEXERIL ) 5 MG tablet Take 1 tablet (5 mg total) by mouth 3 (three) times daily as needed for muscle spasms. 10/27/23   Amin, Ankit C, MD  HYDROcodone -acetaminophen  (NORCO/VICODIN) 5-325 MG tablet Take 1 tablet by mouth every 6 (six) hours as needed  for moderate pain (pain score 4-6). 10/27/23   Caleen Burgess BROCKS, MD  lidocaine -prilocaine  (EMLA ) cream Apply to affected area once 11/03/23   Sherrod Sherrod, MD  ondansetron  (ZOFRAN ) 8 MG tablet Take 1 tablet (8 mg total) by mouth every 8 (eight) hours as needed for nausea or vomiting. Start on the third day after chemotherapy. 11/03/23   Sherrod Sherrod, MD  polyethylene glycol powder (GLYCOLAX /MIRALAX ) 17 GM/SCOOP powder Take 17 g by mouth 2 (two) times daily as needed for mild constipation or moderate constipation. Dissolve 1 capful (17g) in 4-8 ounces of liquid and take by mouth daily. 10/27/23   Amin, Ankit C, MD  prochlorperazine  (COMPAZINE ) 10 MG tablet Take 1 tablet (10 mg total) by mouth every 6 (six) hours as needed. 11/03/23   Heilingoetter, Cassandra L, PA-C  prochlorperazine  (COMPAZINE ) 10 MG tablet Take 1 tablet (10 mg total) by mouth every 6 (six) hours as needed for nausea or vomiting. 11/03/23   Sherrod Sherrod, MD  rosuvastatin  (CRESTOR ) 40 MG tablet Take 1 tablet (40 mg total) by mouth daily. Patient not taking: Reported on 10/19/2023 01/07/22   Shelah Lamar RAMAN, MD  senna-docusate (SENOKOT-S) 8.6-50 MG tablet Take 1 tablet by mouth 2 (two) times daily. 10/27/23   Amin, Ankit C, MD  Vitamin D, Ergocalciferol, (DRISDOL) 1.25 MG (50000 UNIT) CAPS capsule Take 50,000 Units by mouth once a week. Patient not taking: Reported on 10/19/2023 02/09/23   [provider]    Physical Exam: Vitals:   11/12/23 1100 11/12/23 1130 11/12/23 1203 11/12/23 1305  BP: 112/66 115/75 (!) 104/90 102/87  Pulse:   (!) 107 (!) 110  Resp: (!) 30 12 15 18   Temp:    98.4 F (36.9 C)  TempSrc:    Oral  SpO2:   100% 100%  Weight:      Height:       General:  Appears calm and comfortable and is in NAD Eyes:  PERRL, EOMI, normal lids, iris ENT:  HOH, lips & tongue, mmm; appropriate dentition Neck:  no LAD, masses or thyromegaly; no carotid bruits Cardiovascular:  RRR, +systolic murumur,  No LE edema.   Respiratory:   CTA bilaterally with no wheezes/rales/rhonchi.  Normal respiratory effort. Abdomen:  soft, NT, ND, NABS Back:   normal alignment, no CVAT Skin:  no rash or induration seen on limited exam Musculoskeletal:  grossly normal tone BUE/BLE, good ROM, no bony abnormality. Weak, needs assistance to sit up.  Lower extremity:  No LE edema.  Limited foot exam with no ulcerations.  2+ distal pulses. Psychiatric:  grossly normal mood and flat affect, speech fluent, but slow and appropriate, AOx3 Neurologic:  CN 2-12 grossly intact, moves all extremities in coordinated fashion, sensation intact.    Radiological Exams on Admission: Independently reviewed - see discussion in A/P where applicable  CT Angio Chest PE W and/or Wo Contrast Result Date: 11/11/2023 CLINICAL DATA:  Clemens 2 days ago with head and neck trauma and midline neck tenderness. Reports taking blood thinners. Increased leg weakness and swelling. Pulmonary embolism suspected. Prior history of lung cancer. EXAM: CT HEAD WITHOUT CONTRAST CT CERVICAL SPINE WITHOUT CONTRAST CT CHEST, ABDOMEN AND PELVIS WITH CONTRAST TECHNIQUE: Contiguous axial images were obtained from the base of the skull through the vertex without intravenous contrast. Multidetector CT imaging of the cervical spine was performed without intravenous contrast. Multiplanar CT image reconstructions were also generated. Multidetector CT imaging of the chest was performed following the standard protocol during bolus administration of intravenous contrast. Multiplanar CT image reconstructions and MIPS were obtained to evaluate the vascular anatomy. RADIATION DOSE REDUCTION: This exam was performed according to the departmental dose-optimization program which includes automated exposure control, adjustment of the mA and/or kV according to patient size and/or use of iterative reconstruction technique. CONTRAST:  OMNIPAQUE  IOHEXOL  350 MG/ML SOLN COMPARISON:  MRI brain  10/19/2023, CTA head and neck and reconstructions 11/08/2021, soft tissue neck CT 10/18/2023, CTA chest 10/18/2023 and 10/14/2023. FINDINGS: CT HEAD FINDINGS Brain: There is moderately advanced atrophy, small-vessel disease and atrophic ventriculomegaly. Multiple tiny chronic bilateral cerebellar lacunar infarcts are again noted, small chronic lacunar infarcts in the right thalamus and right pons, and a few bilateral chronic basal ganglia lacunae are again noted. No cortical based acute infarct, hemorrhage, mass or mass effect are seen. No new midline shift. Basal cisterns are clear. Vascular: Age advanced calcific plaque both siphons. No hyperdense vessel is seen. 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 If this is still present, it is not visible with noncontrast CT. Skull: Negative for fractures or focal  lesions. Sinuses/Orbits: Negative orbits. Increased patchy opacification ethmoid air cells and increased membrane thickening both maxillary and frontal sinuses. The sphenoid sinuses clear. Again noted is a diffuse right mastoid effusion and partial opacification in the right middle ear. There is increased moderate patchy left mastoid effusion with clear left middle ear cavity. Other: None. CT CERVICAL FINDINGS Alignment: There is a straightened slightly reversed cervical lordosis. There is no listhesis. No widening of the anterior atlantodental joint. No C1-2 lateral mass offset. Skull base and vertebrae: No acute fracture is evident. No primary bone lesion or focal pathologic process. Soft tissues and spinal canal: No prevertebral fluid or swelling. No visible canal hematoma. There is trace calcification at the carotid bifurcations. There is no thyroid  or laryngeal mass. Unremarkable parotid and submandibular glands. Disc levels: The C5-6 disc is degenerative with small bidirectional osteophytes. The other discs  are normal in heights. No significant soft tissue or bony encroachment on the thecal sac and cord is seen. There are slight facet spurring changes at most levels, uncinate spurring at C5-6. Foraminal stenosis is only seen at C5-6 where it is mild. The other foramina appear to be patent. Small anterior endplate spurring is seen at C3-4, C4-5 and C6-7 but no bulky osteophytes or bridging bone. Other:  None. CT CHEST FINDINGS Cardiovascular: There are extensive mediastinal, anterior chest wall and paraspinal venous collateral vessels filling with dense contrast. 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 The SVC was better demonstrated on last study due to the right-sided injection on that exam. Extensive but nonocclusive thrombus was previously noted in the right axillosubclavian vein and could still be present, but this is not evaluated due to the left-sided injection today. The distal SVC and cavoatrial junction, and the cardiac chambers opacify well. Nonocclusive thrombus in the azygous vein is also again noted. Partially occlusive thrombus also appears to still be present within an engorged right internal mammary vein on series 8 images 40-54. The heart is slightly enlarged, with left ventricular and septal hypertrophy and patchy calcification of the mitral ring. There is no enlargement of the right chambers. Small pericardial effusion is again noted. There are normal caliber pulmonary arteries, adequately opacified to the segmental level with no evidence of arterial embolism, but the subsegmental arteries largely unopacified and not evaluated. There are moderate patchy calcific plaques in the aorta and proximal great vessels skull with heavy calcifications and thickening of the aortic valve leaflets without aortic aneurysm, dissection or stenosis. There are scattered three-vessel  coronary artery calcific plaques. The pulmonary veins are normal in caliber. Mediastinum/Nodes: Stable prominent right mid hilar lymph node of 1.4 cm short axis on 8:54. Right paratracheal area is difficult to assess due to the engorged thrombosed SVC but no new abnormality is seen in the area. I do not see further adenopathy. Lungs/Pleura: Interval new small layering right pleural effusion. No left pleural effusion. Right upper lobe irregularly thick-walled cavitary lesion posteriorly again noted measuring 5.1 x 4.1 cm, not notably changed in size but with increased fluid now almost completely filling the cavity. Infectious complication not excluded. There are scarring changes, mild volume loss in the right middle lobe. Stable 7 mm ground-glass right upper lobe nodule on 10:66. Remaining lungs are generally clear. Upper abdomen: No acute abnormality. Musculoskeletal: No regional bone metastases. Review of the MIP images confirms the above findings. IMPRESSION: 1. No acute intracranial CT  findings or depressed skull fractures. 2. Atrophy, small-vessel disease and multiple chronic lacunar infarcts. 3. Sinus disease with increased membrane thickening in the frontal and maxillary sinuses, increased patchy opacification of the ethmoid air cells. 4. Persistent right mastoid effusion and partial opacification of the right middle ear. Interval increased left mastoid effusion. 5. No evidence of cervical fractures or malalignment. 6. C5-6 degenerative disc disease and spondylosis with mild foraminal stenosis. 7. No evidence of pulmonary arterial dilatation or embolus through the segmental level. Subsegmental arteries are largely unopacified and not evaluated. 8. 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 9. Not well evaluated, there may still be nonocclusive thrombus  in the right axillosubclavian vein and azygous vein, and there is partially occlusive thrombus in the right internal mammary vein. 10. Interval new small layering right pleural effusion. 11. 5.1 x 4.1 cm right upper lobe cavitary mass is now almost fully filled with fluid. Infectious complication not excluded. 12. Stable 7 mm ground-glass right upper lobe nodule. 13. Stable prominent right mid hilar lymph node. 14. Aortic and coronary artery atherosclerosis. Heavily calcified thickened aortic valve leaflets. Aortic Atherosclerosis (ICD10-I70.0). Electronically Signed   By: Francis Quam M.D.   On: 11/11/2023 20:59   CT Head Wo Contrast Result Date: 11/11/2023 CLINICAL DATA:  Clemens 2 days ago with head and neck trauma and midline neck tenderness. Reports taking blood thinners. Increased leg weakness and swelling. Pulmonary embolism suspected. Prior history of lung cancer. EXAM: CT HEAD WITHOUT CONTRAST CT CERVICAL SPINE WITHOUT CONTRAST CT CHEST, ABDOMEN AND PELVIS WITH CONTRAST TECHNIQUE: Contiguous axial images were obtained from the base of the skull through the vertex without intravenous contrast. Multidetector CT imaging of the cervical spine was performed without intravenous contrast. Multiplanar CT image reconstructions were also generated. Multidetector CT imaging of the chest was performed following the standard protocol during bolus administration of intravenous contrast. Multiplanar CT image reconstructions and MIPS were obtained to evaluate the vascular anatomy. RADIATION DOSE REDUCTION: This exam was performed according to the departmental dose-optimization program which includes automated exposure control, adjustment of the mA and/or kV according to patient size and/or use of iterative reconstruction technique. CONTRAST:  OMNIPAQUE  IOHEXOL  350 MG/ML SOLN COMPARISON:  MRI brain 10/19/2023, CTA head and neck and reconstructions 11/08/2021, soft tissue neck CT 10/18/2023, CTA chest 10/18/2023 and  10/14/2023. FINDINGS: CT HEAD FINDINGS Brain: There is moderately advanced atrophy, small-vessel disease and atrophic ventriculomegaly. Multiple tiny chronic bilateral cerebellar lacunar infarcts are again noted, small chronic lacunar infarcts in the right thalamus and right pons, and a few bilateral chronic basal ganglia lacunae are again noted. No cortical based acute infarct, hemorrhage, mass or mass effect are seen. No new midline shift. Basal cisterns are clear. Vascular: Age advanced calcific plaque both siphons. No hyperdense vessel is seen. 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 If this is still present, it is not visible with noncontrast CT. Skull: Negative for fractures or focal lesions. Sinuses/Orbits: Negative orbits. Increased patchy opacification ethmoid air cells and increased membrane thickening both maxillary and frontal sinuses. The sphenoid sinuses clear. Again noted is a diffuse right mastoid effusion and partial opacification in the right middle ear. There is increased moderate patchy left mastoid effusion with clear left middle ear cavity. Other: None. CT CERVICAL FINDINGS Alignment: There is a straightened slightly  reversed cervical lordosis. There is no listhesis. No widening of the anterior atlantodental joint. No C1-2 lateral mass offset. Skull base and vertebrae: No acute fracture is evident. No primary bone lesion or focal pathologic process. Soft tissues and spinal canal: No prevertebral fluid or swelling. No visible canal hematoma. There is trace calcification at the carotid bifurcations. There is no thyroid  or laryngeal mass. Unremarkable parotid and submandibular glands. Disc levels: The C5-6 disc is degenerative with small bidirectional osteophytes. The other discs are normal in heights. No significant soft tissue or bony encroachment on the thecal sac and cord is seen. There are  slight facet spurring changes at most levels, uncinate spurring at C5-6. Foraminal stenosis is only seen at C5-6 where it is mild. The other foramina appear to be patent. Small anterior endplate spurring is seen at C3-4, C4-5 and C6-7 but no bulky osteophytes or bridging bone. Other:  None. CT CHEST FINDINGS Cardiovascular: There are extensive mediastinal, anterior chest wall and paraspinal venous collateral vessels filling with dense contrast. 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 The SVC was better demonstrated on last study due to the right-sided injection on that exam. Extensive but nonocclusive thrombus was previously noted in the right axillosubclavian vein and could still be present, but this is not evaluated due to the left-sided injection today. The distal SVC and cavoatrial junction, and the cardiac chambers opacify well. Nonocclusive thrombus in the azygous vein is also again noted. Partially occlusive thrombus also appears to still be present within an engorged right internal mammary vein on series 8 images 40-54. The heart is slightly enlarged, with left ventricular and septal hypertrophy and patchy calcification of the mitral ring. There is no enlargement of the right chambers. Small pericardial effusion is again noted. There are normal caliber pulmonary arteries, adequately opacified to the segmental level with no evidence of arterial embolism, but the subsegmental arteries largely unopacified and not evaluated. There are moderate patchy calcific plaques in the aorta and proximal great vessels skull with heavy calcifications and thickening of the aortic valve leaflets without aortic aneurysm, dissection or stenosis. There are scattered three-vessel coronary artery calcific plaques. The pulmonary veins are normal in caliber. Mediastinum/Nodes: Stable prominent right mid  hilar lymph node of 1.4 cm short axis on 8:54. Right paratracheal area is difficult to assess due to the engorged thrombosed SVC but no new abnormality is seen in the area. I do not see further adenopathy. Lungs/Pleura: Interval new small layering right pleural effusion. No left pleural effusion. Right upper lobe irregularly thick-walled cavitary lesion posteriorly again noted measuring 5.1 x 4.1 cm, not notably changed in size but with increased fluid now almost completely filling the cavity. Infectious complication not excluded. There are scarring changes, mild volume loss in the right middle lobe. Stable 7 mm ground-glass right upper lobe nodule on 10:66. Remaining lungs are generally clear. Upper abdomen: No acute abnormality. Musculoskeletal: No regional bone metastases. Review of the MIP images confirms the above findings. IMPRESSION: 1. No acute intracranial CT findings or depressed skull fractures. 2. Atrophy, small-vessel disease and multiple chronic lacunar infarcts. 3. Sinus disease with increased membrane thickening in the frontal and maxillary sinuses, increased patchy opacification of the ethmoid air cells. 4. Persistent right mastoid effusion and partial opacification of the right middle ear. Interval increased left mastoid effusion. 5. No evidence of cervical fractures or malalignment. 6. C5-6 degenerative  disc disease and spondylosis with mild foraminal stenosis. 7. No evidence of pulmonary arterial dilatation or embolus through the segmental level. Subsegmental arteries are largely unopacified and not evaluated. 8. 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 9. Not well evaluated, there may still be nonocclusive thrombus in the right axillosubclavian vein and azygous vein, and there is partially occlusive thrombus in the right internal mammary  vein. 10. Interval new small layering right pleural effusion. 11. 5.1 x 4.1 cm right upper lobe cavitary mass is now almost fully filled with fluid. Infectious complication not excluded. 12. Stable 7 mm ground-glass right upper lobe nodule. 13. Stable prominent right mid hilar lymph node. 14. Aortic and coronary artery atherosclerosis. Heavily calcified thickened aortic valve leaflets. Aortic Atherosclerosis (ICD10-I70.0). Electronically Signed   By: Francis Quam M.D.   On: 11/11/2023 20:59   CT Cervical Spine Wo Contrast Result Date: 11/11/2023 CLINICAL DATA:  Clemens 2 days ago with head and neck trauma and midline neck tenderness. Reports taking blood thinners. Increased leg weakness and swelling. Pulmonary embolism suspected. Prior history of lung cancer. EXAM: CT HEAD WITHOUT CONTRAST CT CERVICAL SPINE WITHOUT CONTRAST CT CHEST, ABDOMEN AND PELVIS WITH CONTRAST TECHNIQUE: Contiguous axial images were obtained from the base of the skull through the vertex without intravenous contrast. Multidetector CT imaging of the cervical spine was performed without intravenous contrast. Multiplanar CT image reconstructions were also generated. Multidetector CT imaging of the chest was performed following the standard protocol during bolus administration of intravenous contrast. Multiplanar CT image reconstructions and MIPS were obtained to evaluate the vascular anatomy. RADIATION DOSE REDUCTION: This exam was performed according to the departmental dose-optimization program which includes automated exposure control, adjustment of the mA and/or kV according to patient size and/or use of iterative reconstruction technique. CONTRAST:  OMNIPAQUE  IOHEXOL  350 MG/ML SOLN COMPARISON:  MRI brain 10/19/2023, CTA head and neck and reconstructions 11/08/2021, soft tissue neck CT 10/18/2023, CTA chest 10/18/2023 and 10/14/2023. FINDINGS: CT HEAD FINDINGS Brain: There is moderately advanced atrophy, small-vessel disease and  atrophic ventriculomegaly. Multiple tiny chronic bilateral cerebellar lacunar infarcts are again noted, small chronic lacunar infarcts in the right thalamus and right pons, and a few bilateral chronic basal ganglia lacunae are again noted. No cortical based acute infarct, hemorrhage, mass or mass effect are seen. No new midline shift. Basal cisterns are clear. Vascular: Age advanced calcific plaque both siphons. No hyperdense vessel is seen. 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 If this is still present, it is not visible with noncontrast CT. Skull: Negative for fractures or focal lesions. Sinuses/Orbits: Negative orbits. Increased patchy opacification ethmoid air cells and increased membrane thickening both maxillary and frontal sinuses. The sphenoid sinuses clear. Again noted is a diffuse right mastoid effusion and partial opacification in the right middle ear. There is increased moderate patchy left mastoid effusion with clear left middle ear cavity. Other: None. CT CERVICAL FINDINGS Alignment: There is a straightened slightly reversed cervical lordosis. There is no listhesis. No widening of the anterior atlantodental joint. No C1-2 lateral mass offset. Skull base and vertebrae: No acute fracture is evident. No primary bone lesion or focal pathologic process. Soft tissues and spinal canal: No prevertebral fluid or swelling. No visible canal hematoma. There is trace calcification at the carotid bifurcations. There is no thyroid  or  laryngeal mass. Unremarkable parotid and submandibular glands. Disc levels: The C5-6 disc is degenerative with small bidirectional osteophytes. The other discs are normal in heights. No significant soft tissue or bony encroachment on the thecal sac and cord is seen. There are slight facet spurring changes at most levels, uncinate spurring at C5-6. Foraminal stenosis is only seen at C5-6  where it is mild. The other foramina appear to be patent. Small anterior endplate spurring is seen at C3-4, C4-5 and C6-7 but no bulky osteophytes or bridging bone. Other:  None. CT CHEST FINDINGS Cardiovascular: There are extensive mediastinal, anterior chest wall and paraspinal venous collateral vessels filling with dense contrast. 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 The SVC was better demonstrated on last study due to the right-sided injection on that exam. Extensive but nonocclusive thrombus was previously noted in the right axillosubclavian vein and could still be present, but this is not evaluated due to the left-sided injection today. The distal SVC and cavoatrial junction, and the cardiac chambers opacify well. Nonocclusive thrombus in the azygous vein is also again noted. Partially occlusive thrombus also appears to still be present within an engorged right internal mammary vein on series 8 images 40-54. The heart is slightly enlarged, with left ventricular and septal hypertrophy and patchy calcification of the mitral ring. There is no enlargement of the right chambers. Small pericardial effusion is again noted. There are normal caliber pulmonary arteries, adequately opacified to the segmental level with no evidence of arterial embolism, but the subsegmental arteries largely unopacified and not evaluated. There are moderate patchy calcific plaques in the aorta and proximal great vessels skull with heavy calcifications and thickening of the aortic valve leaflets without aortic aneurysm, dissection or stenosis. There are scattered three-vessel coronary artery calcific plaques. The pulmonary veins are normal in caliber. Mediastinum/Nodes: Stable prominent right mid hilar lymph node of 1.4 cm short axis on 8:54. Right paratracheal area is difficult to assess due to the engorged  thrombosed SVC but no new abnormality is seen in the area. I do not see further adenopathy. Lungs/Pleura: Interval new small layering right pleural effusion. No left pleural effusion. Right upper lobe irregularly thick-walled cavitary lesion posteriorly again noted measuring 5.1 x 4.1 cm, not notably changed in size but with increased fluid now almost completely filling the cavity. Infectious complication not excluded. There are scarring changes, mild volume loss in the right middle lobe. Stable 7 mm ground-glass right upper lobe nodule on 10:66. Remaining lungs are generally clear. Upper abdomen: No acute abnormality. Musculoskeletal: No regional bone metastases. Review of the MIP images confirms the above findings. IMPRESSION: 1. No acute intracranial CT findings or depressed skull fractures. 2. Atrophy, small-vessel disease and multiple chronic lacunar infarcts. 3. Sinus disease with increased membrane thickening in the frontal and maxillary sinuses, increased patchy opacification of the ethmoid air cells. 4. Persistent right mastoid effusion and partial opacification of the right middle ear. Interval increased left mastoid effusion. 5. No evidence of cervical fractures or malalignment. 6. C5-6 degenerative disc disease and spondylosis with mild foraminal stenosis. 7. No evidence of pulmonary arterial dilatation or embolus through the segmental level. Subsegmental arteries are largely unopacified and not evaluated. 8. 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 9. Not well evaluated, there may still  be nonocclusive thrombus in the right axillosubclavian vein and azygous vein, and there is partially occlusive thrombus in the right internal mammary vein. 10. Interval new small layering right pleural effusion. 11. 5.1 x 4.1 cm right upper lobe cavitary mass is  now almost fully filled with fluid. Infectious complication not excluded. 12. Stable 7 mm ground-glass right upper lobe nodule. 13. Stable prominent right mid hilar lymph node. 14. Aortic and coronary artery atherosclerosis. Heavily calcified thickened aortic valve leaflets. Aortic Atherosclerosis (ICD10-I70.0). Electronically Signed   By: Francis Quam M.D.   On: 11/11/2023 20:59   US  Venous Img Lower Right (DVT Study) Result Date: 11/11/2023 EXAM: ULTRASOUND DUPLEX OF THE RIGHT LOWER EXTREMITY VEINS TECHNIQUE: Duplex ultrasound using B-mode/gray scaled imaging and Doppler spectral analysis and color flow was obtained of the deep venous structures of the right lower extremity. COMPARISON: None. CLINICAL HISTORY: Swelling, lung cancer. FINDINGS: The common femoral vein, femoral vein, popliteal vein, and posterior tibial vein of the right lower extremity demonstrate normal compressibility with normal color flow and spectral analysis. IMPRESSION: 1. No evidence of DVT. Electronically signed by: Ozell Daring MD 11/11/2023 07:10 PM EDT RP Workstation: [id number]    EKG: Independently reviewed.  Sinus tachycardia with rate 114; nonspecific ST changes with no evidence of acute ischemia   Labs on Admission: I have personally reviewed the available labs and imaging studies at the time of the admission.  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 Infection on CTA chest and moderate to severe aortic stenosis.  -she has right upper lobe cavitary mass s/p bronch back in 10/2023 that is now filled with fluid and infectious complication can not be excluded. She has no leukocytosis, fever, but met SIRS criteria on admission with tachycardia and tachypnea. Will continue abx, trend PCT. She has no complaints of shortness of breath, chest pain or worsening cough.  -PT/OT to see/eval  -moderate to severe aortic stenosis, cardiology f/u vs. Consult.  -consider SSRI/f/u with PCP  -SW as may need rehab   head and neck thrombosis During recent hospitalization in 10/2023 found to have acute thrombus involving SVC, brachiocephalic vein, azygous vein, right axillary vein and right subclavian vein. Also dural sinus thrombosis -repeat imaging continues to show above findings  -continue eliquis    Cavitating mass in right upper lung lobe Bronchoscopy during recent hospitalization (negative fungal, acid fast smear negative and cx pending)  Now nearly fully filled with fluid and infectious complication not excluded Given rocephin  and azithromycin  in ED  Change to cefepime and vanc given recent 10 day abx/hospitalization with bronch  Trend PCT   Non-small cell lung cancer (HCC) History of small cell lung cancer s/p radiation and chemotherapy treatment at [facility] in 2007 Recently found to have right sided cavitary lesion with lymphadenopathy in 10/2023  Underwent bronchoscopy and lymph node biopsy with findings consistent with 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  Have added on oncology, Dr. Sherrod  History of CVA (cerebrovascular accident) Small vessel disease versus cancer related hypercoagulability  Continue high intensity statin and eliquis     HTN (hypertension) Hypotensive Not on any ant HTN medication  Check orthostatics   Normocytic anemia Stable. Iron studies done 10/2023   Aortic stenosis Echo 10/2023: shows EF 65%, grade 1 DD, moderate to severe aortic valve stenosis. Aortic valve with indeterminate number of cusps.  Could be contributing to her fatigue  See cards outpatient   CAD (coronary artery disease) Continue medical management   HLD (hyperlipidemia) Continue crestor  40mg  daily   Polysubstance abuse (HCC) History of tobacco abuse and cocaine  abuse Last used cocaine  2 years ago Has not smoked in 1 month  UDS pending      Advance Care Planning:   Code Status: Full Code    Consults: PT/OT/SW   DVT Prophylaxis: eliquis    Family Communication: updated daughter by phone: [name]   Severity of Illness: The appropriate patient status for this patient is OBSERVATION. Observation status is judged to be reasonable and necessary in order to provide the required intensity of service to ensure the patient's safety. The patient's presenting symptoms, physical exam findings, and initial radiographic and laboratory data in the context of their medical condition is felt to place them at decreased risk for further clinical deterioration. Furthermore, it is anticipated that the patient will be medically stable for discharge from the hospital within 2 midnights of admission.   Author: Isaiah Geralds, MD 11/12/2023 2:58 PM  For on call review [email or web address].

## 2023-11-12 NOTE — Plan of Care (Signed)

## 2023-11-12 NOTE — Progress Notes (Incomplete)
 Radiation Oncology         [phone] ________________________________  Initial Inpatient Consultation  Name: Andrea Mullen MRN: [id number]  Date: 11/12/2023  DOB: 11/08/1965  RR:[facility], [facility]  Sherrod Sherrod, MD   REFERRING PHYSICIAN: Sherrod Sherrod, MD  DIAGNOSIS: No diagnosis found.   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 She was treated with chemotherapy and radiation therapy at [facility]. She had also established care with Dr. Shelah on October of 2023 due to an abnormal chest CT on 11/09/21 showing a solid 10 Mullen right upper lobe pulmonary nodule, a 5 Mullen ground-glass right upper lobe nodule, and subpleural linear opacities in the right upper lobe consistent with scaring. Follow up imaging was recommended at that time which was never performed.   Pertaining to her present recurrence, she presented to the ED on 10/14/23 with SOB and a productive cough. A CTA of the chest was performed in this setting which revealed a right upper lobe mass measuring approximately 5.7 x 5.3 cm (at the site of the previously demonstrated small RUL pulmonary nodule on her prior chest CT from October 2023), mildly prominent sub-centimeter bilateral supraclavicular lymph nodes, and bulky mediastinal lymphadenopathy measuring up to 3.1 x 2.5 cm, concerning for primary lung malignancy. Further work-up was recommended,  and she was discharged with instructions to pursue OP follow-up.   She then returned to the ED on 10/18/23 with c/o persistent SOB and chest pain. Initial ED work-up consisting of a head and soft tissue neck CT revealed head and neck thrombosis/dural sinus thrombosis and an acute CVA. Neurology and vascular surgery were consulted and she was admitted and started on IV heparin . A CTA of the chest was also performed upon admission which redemonstrated the 4.4 cm x 4.8 cm x 4.6 cm thick walled cavitary lesion within the posterior aspect of the RUL, and mild right hilar lymphadenopathy. CTA findings also included: occlusive thrombus within the brachiocephalic vein, nonocclusive thrombus within the superior vena cava and azygous vein, and extensive, nonocclusive thrombus within the right axillary vein and right subclavian vein.   Pulmonary medicine was consulted and she was cleared to undergo and bronchoscopy with EBUS on 10/22/23 under the care of Dr. Jude. FNA of a 4R lymph node was collected at that time and showed non small cell carcinoma, consistent with squamous cell carcinoma. RUL lavage and brushings were also submitted for cytology; both showed no evidence of malignancy.   Other imaging performed during her hospitalization included:  -- CT venogram of the head on 09/09 which showed: stable thrombosis of the right IJ, right sigmoid sinus, and lateral half of the right transverse dural venous sinus. No other sites of intracranial venous thrombosis were identified, or evidence of intracranial mass effect, hemorrhage, or acute cerebral edema. 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  The remainder of her hospital course consisted of heparin  (later transitioned to Lovenox  at discharge) and abx. She as also started on Eliquis . She was discharged home in stable condition on 10/27/23.    After being discharged, she established care with Dr. Sherrod on 11/03/23. Dr. Sherrod has recommended concurrent chemoradiation with weekly carboplatin and paclitaxel which we will discuss in detail today. She is expected to undergo her first cycle of chemotherapy on 11/15/23.   She was seen in the [facility] ED yesterday for evaluation of left leg swelling and left sided neck pain after several falls on 09/23 resulting in head trauma. Imaging performed in the ED included a CT of the head which showed no evidence of acute trauma. Imaging findings did however show increased sinus membrane thickening of the frontal and maxillary sinuses and patchy opacification of ethmoid air cells and right mastoid effusion. A CT of the cervical spine was then performed which also showed no evidence of acute injury or trauma.   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 CT  findings otherwise showed stability of the 7 Mullen ground-glass nodule in the RUL, and stability of the prominent right mid hilar lymph node.   She was started on IV abx for possible PNA related to her cavitary lesion. Admission was recommended and she was transferred to [facility] earlier today for further management.    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    PAST SURGICAL HISTORY: Past Surgical History:  Procedure Laterality Date   CARDIAC CATHETERIZATION N/A 11/01/2014   Procedure: Left Heart Cath and Coronary Angiography;  Surgeon: Victory LELON Sharps, MD;  Location: [facility] INVASIVE CV LAB;  Service: Cardiovascular;  Laterality: N/A;   CESAREAN SECTION     x3   HERNIA REPAIR     VIDEO BRONCHOSCOPY WITH ENDOBRONCHIAL ULTRASOUND Right 10/22/2023   Procedure: BRONCHOSCOPY, WITH EBUS;  Surgeon: Jude Harden GAILS, MD;  Location: [facility] ENDOSCOPY;  Service: Cardiopulmonary;  Laterality: Right;    FAMILY HISTORY: family history includes Clotting disorder in her mother.  SOCIAL HISTORY:  reports that she has been smoking cigarettes. She has never used smokeless tobacco. She reports that she does not currently use alcohol. She reports that she does not currently use drugs after having used the following drugs: Marijuana and Cocaine .  ALLERGIES: Bicillin c-r, Penicillins, Prednisone , and Nsaids  MEDICATIONS:  Current Outpatient Medications  Medication Sig Dispense Refill   albuterol  (PROVENTIL ) (2.5 MG/3ML) 0.083% nebulizer solution Take 3 mLs (2.5 mg total) by nebulization every 6 (six) hours as needed for wheezing or shortness of breath. (Patient not taking: Reported on 10/19/2023) 75 mL 12   albuterol  (VENTOLIN  HFA) 108 (90 Base) MCG/ACT inhaler Inhale 2 puffs into the lungs every 4  (four) hours as needed for wheezing or shortness of breath. 18 g 0   apixaban  (ELIQUIS ) 5 MG TABS tablet Take 1 tablet (5 mg total) by mouth 2 (two) times daily. 60 tablet 0   butalbital -acetaminophen -caffeine  (FIORICET) 50-325-40 MG tablet Take 1-2 tablets by mouth every 6 (six) hours as needed for headache. (Patient not taking: Reported on 10/19/2023) 20 tablet 0   cyclobenzaprine  (FLEXERIL ) 5 MG tablet Take 1 tablet (5 mg total) by mouth 3 (three) times daily as needed for muscle spasms. 30 tablet 0   HYDROcodone -acetaminophen  (NORCO/VICODIN) 5-325 MG tablet Take 1 tablet by mouth every 6 (six) hours as needed for moderate pain (pain score 4-6). 30 tablet 0   lidocaine -prilocaine  (EMLA ) cream Apply to affected area once 30 g 3   ondansetron  (ZOFRAN ) 8 MG tablet Take 1 tablet (8 mg total) by mouth every 8 (eight) hours as needed for nausea or vomiting. Start on the third day after chemotherapy. 30 tablet 1   polyethylene glycol powder (GLYCOLAX /MIRALAX ) 17 GM/SCOOP powder Take 17 g by mouth 2 (two) times daily as needed for mild constipation or moderate constipation. Dissolve 1 capful (17g) in 4-8 ounces of liquid and take by mouth daily. 238 g 0   prochlorperazine  (COMPAZINE ) 10 MG tablet Take 1 tablet (10 mg total) by mouth every 6 (six) hours as needed. 30 tablet 2   prochlorperazine  (COMPAZINE ) 10 MG tablet Take 1 tablet (10 mg total) by mouth every 6 (six) hours as needed for nausea or vomiting. 30 tablet 1   rosuvastatin  (CRESTOR ) 40 MG tablet Take 1 tablet (40 mg total) by mouth daily. (Patient not taking: Reported on 10/19/2023) 30 tablet 3   senna-docusate (SENOKOT-S) 8.6-50 MG tablet Take 1 tablet by mouth 2 (two) times daily. 60 tablet 0   Vitamin D, Ergocalciferol, (DRISDOL) 1.25 MG (50000 UNIT) CAPS capsule Take 50,000 Units by mouth once a week. (Patient not taking: Reported on 10/19/2023)     No current facility-administered medications for this encounter.    REVIEW OF SYSTEMS:  Notable  for that above.   PHYSICAL EXAM:  vitals were not taken for this visit.   General: Alert and oriented, in no acute distress *** HEENT: Head is normocephalic. Extraocular movements are intact. Oropharynx is clear. Neck: Neck is supple, no palpable cervical or supraclavicular lymphadenopathy. Heart: Regular in rate and rhythm with no murmurs, rubs, or gallops. Chest: Clear to auscultation bilaterally, with no rhonchi, wheezes, or rales. Abdomen: Soft, nontender, nondistended, with no rigidity or guarding. Extremities: No cyanosis or edema. Lymphatics: see Neck Exam Skin: No concerning lesions. Musculoskeletal: symmetric strength and muscle tone throughout. Neurologic: Cranial nerves II through XII are grossly intact. No obvious focalities. Speech is fluent. Coordination is intact. Psychiatric: Judgment and insight are intact. Affect is appropriate.   ECOG = ***  0 - Asymptomatic (Fully active, able to carry on all predisease activities without restriction)  1 - Symptomatic but completely ambulatory (Restricted in physically strenuous activity but ambulatory and able to carry out work of a light or sedentary nature. For example, light housework, office work)  2 -  Symptomatic, <50% in bed during the day (Ambulatory and capable of all self care but unable to carry out any work activities. Up and about more than 50% of waking hours)  3 - Symptomatic, >50% in bed, but not bedbound (Capable of only limited self-care, confined to bed or chair 50% or more of waking hours)  4 - Bedbound (Completely disabled. Cannot carry on any self-care. Totally confined to bed or chair)  5 - Death   Andrea Mullen, Creech RH, Tormey DC, et al. [phone]). Toxicity and response criteria of the [facility]. Am. DOROTHA Bridges. Oncol. 5 (6): 649-55   LABORATORY DATA:  Lab Results  Component Value Date   WBC 4.6 11/11/2023   HGB 10.7 (L) 11/11/2023   HCT 33.3 (L) 11/11/2023   MCV 88.1 11/11/2023    PLT 207 11/11/2023   CMP     Component Value Date/Time   NA 136 11/11/2023 1834   K 4.2 11/11/2023 1834   CL 99 11/11/2023 1834   CO2 26 11/11/2023 1834   GLUCOSE 127 (H) 11/11/2023 1834   BUN 8 11/11/2023 1834   CREATININE 0.73 11/11/2023 1834   CREATININE 0.68 11/03/2023 0922   CREATININE 0.72 04/03/2021 0000   CALCIUM  10.1 11/11/2023 1834   PROT 8.0 11/11/2023 1834   ALBUMIN 3.6 11/11/2023 1834   AST 28 11/11/2023 1834   AST 14 (L) 11/03/2023 0922   ALT 16 11/11/2023 1834   ALT 15 11/03/2023 0922   ALKPHOS 64 11/11/2023 1834   BILITOT 0.6 11/11/2023 1834   BILITOT 0.5 11/03/2023 0922   EGFR 99 04/03/2021 0000   GFRNONAA >60 11/11/2023 1834   GFRNONAA >60 11/03/2023 0922         RADIOGRAPHY: CT Angio Chest PE W and/or Wo Contrast Result Date: 11/11/2023 CLINICAL DATA:  Clemens 2 days ago with head and neck trauma and midline neck tenderness. Reports taking blood thinners. Increased leg weakness and swelling. Pulmonary embolism suspected. Prior history of lung cancer. EXAM: CT HEAD WITHOUT CONTRAST CT CERVICAL SPINE WITHOUT CONTRAST CT CHEST, ABDOMEN AND PELVIS WITH CONTRAST TECHNIQUE: Contiguous axial images were obtained from the base of the skull through the vertex without intravenous contrast. Multidetector CT imaging of the cervical spine was performed without intravenous contrast. Multiplanar CT image reconstructions were also generated. Multidetector CT imaging of the chest was performed following the standard protocol during bolus administration of intravenous contrast. Multiplanar CT image reconstructions and MIPS were obtained to evaluate the vascular anatomy. RADIATION DOSE REDUCTION: This exam was performed according to the departmental dose-optimization program which includes automated exposure control, adjustment of the mA and/or kV according to patient size and/or use of iterative reconstruction technique. CONTRAST:  OMNIPAQUE  IOHEXOL  350 MG/ML SOLN COMPARISON:  MRI  brain 10/19/2023, CTA head and neck and reconstructions 11/08/2021, soft tissue neck CT 10/18/2023, CTA chest 10/18/2023 and 10/14/2023. FINDINGS: CT HEAD FINDINGS Brain: There is moderately advanced atrophy, small-vessel disease and atrophic ventriculomegaly. Multiple tiny chronic bilateral cerebellar lacunar infarcts are again noted, small chronic lacunar infarcts in the right thalamus and right pons, and a few bilateral chronic basal ganglia lacunae are again noted. No cortical based acute infarct, hemorrhage, mass or mass effect are seen. No new midline shift. Basal cisterns are clear. Vascular: Age advanced calcific plaque both siphons. No hyperdense vessel is seen. 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 If this is still present, it is not  visible with noncontrast CT. Skull: Negative for fractures or focal lesions. Sinuses/Orbits: Negative orbits. Increased patchy opacification ethmoid air cells and increased membrane thickening both maxillary and frontal sinuses. The sphenoid sinuses clear. Again noted is a diffuse right mastoid effusion and partial opacification in the right middle ear. There is increased moderate patchy left mastoid effusion with clear left middle ear cavity. Other: None. CT CERVICAL FINDINGS Alignment: There is a straightened slightly reversed cervical lordosis. There is no listhesis. No widening of the anterior atlantodental joint. No C1-2 lateral mass offset. Skull base and vertebrae: No acute fracture is evident. No primary bone lesion or focal pathologic process. Soft tissues and spinal canal: No prevertebral fluid or swelling. No visible canal hematoma. There is trace calcification at the carotid bifurcations. There is no thyroid  or laryngeal mass. Unremarkable parotid and submandibular glands. Disc levels: The C5-6 disc is degenerative with small bidirectional osteophytes. The other  discs are normal in heights. No significant soft tissue or bony encroachment on the thecal sac and cord is seen. There are slight facet spurring changes at most levels, uncinate spurring at C5-6. Foraminal stenosis is only seen at C5-6 where it is mild. The other foramina appear to be patent. Small anterior endplate spurring is seen at C3-4, C4-5 and C6-7 but no bulky osteophytes or bridging bone. Other:  None. CT CHEST FINDINGS Cardiovascular: There are extensive mediastinal, anterior chest wall and paraspinal venous collateral vessels filling with dense contrast. 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 The SVC was better demonstrated on last study due to the right-sided injection on that exam. Extensive but nonocclusive thrombus was previously noted in the right axillosubclavian vein and could still be present, but this is not evaluated due to the left-sided injection today. The distal SVC and cavoatrial junction, and the cardiac chambers opacify well. Nonocclusive thrombus in the azygous vein is also again noted. Partially occlusive thrombus also appears to still be present within an engorged right internal mammary vein on series 8 images 40-54. The heart is slightly enlarged, with left ventricular and septal hypertrophy and patchy calcification of the mitral ring. There is no enlargement of the right chambers. Small pericardial effusion is again noted. There are normal caliber pulmonary arteries, adequately opacified to the segmental level with no evidence of arterial embolism, but the subsegmental arteries largely unopacified and not evaluated. There are moderate patchy calcific plaques in the aorta and proximal great vessels skull with heavy calcifications and thickening of the aortic valve leaflets without aortic aneurysm, dissection or stenosis. There are scattered three-vessel  coronary artery calcific plaques. The pulmonary veins are normal in caliber. Mediastinum/Nodes: Stable prominent right mid hilar lymph node of 1.4 cm short axis on 8:54. Right paratracheal area is difficult to assess due to the engorged thrombosed SVC but no new abnormality is seen in the area. I do not see further adenopathy. Lungs/Pleura: Interval new small layering right pleural effusion. No left pleural effusion. Right upper lobe irregularly thick-walled cavitary lesion posteriorly again noted measuring 5.1 x 4.1 cm, not notably changed in size but with increased fluid now almost completely filling the cavity. Infectious complication not excluded. There are scarring changes, mild volume loss in the right middle lobe. Stable 7 Mullen ground-glass right upper lobe nodule on 10:66. Remaining lungs are generally clear. Upper abdomen: No acute abnormality. Musculoskeletal: No regional bone metastases. Review of the MIP images  confirms the above findings. IMPRESSION: 1. No acute intracranial CT findings or depressed skull fractures. 2. Atrophy, small-vessel disease and multiple chronic lacunar infarcts. 3. Sinus disease with increased membrane thickening in the frontal and maxillary sinuses, increased patchy opacification of the ethmoid air cells. 4. Persistent right mastoid effusion and partial opacification of the right middle ear. Interval increased left mastoid effusion. 5. No evidence of cervical fractures or malalignment. 6. C5-6 degenerative disc disease and spondylosis with mild foraminal stenosis. 7. No evidence of pulmonary arterial dilatation or embolus through the segmental level. Subsegmental arteries are largely unopacified and not evaluated. 8. 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 9. Not well evaluated, there may still be nonocclusive thrombus  in the right axillosubclavian vein and azygous vein, and there is partially occlusive thrombus in the right internal mammary vein. 10. Interval new small layering right pleural effusion. 11. 5.1 x 4.1 cm right upper lobe cavitary mass is now almost fully filled with fluid. Infectious complication not excluded. 12. Stable 7 Mullen ground-glass right upper lobe nodule. 13. Stable prominent right mid hilar lymph node. 14. Aortic and coronary artery atherosclerosis. Heavily calcified thickened aortic valve leaflets. Aortic Atherosclerosis (ICD10-I70.0). Electronically Signed   By: Francis Quam M.D.   On: 11/11/2023 20:59   CT Head Wo Contrast Result Date: 11/11/2023 CLINICAL DATA:  Clemens 2 days ago with head and neck trauma and midline neck tenderness. Reports taking blood thinners. Increased leg weakness and swelling. Pulmonary embolism suspected. Prior history of lung cancer. EXAM: CT HEAD WITHOUT CONTRAST CT CERVICAL SPINE WITHOUT CONTRAST CT CHEST, ABDOMEN AND PELVIS WITH CONTRAST TECHNIQUE: Contiguous axial images were obtained from the base of the skull through the vertex without intravenous contrast. Multidetector CT imaging of the cervical spine was performed without intravenous contrast. Multiplanar CT image reconstructions were also generated. Multidetector CT imaging of the chest was performed following the standard protocol during bolus administration of intravenous contrast. Multiplanar CT image reconstructions and MIPS were obtained to evaluate the vascular anatomy. RADIATION DOSE REDUCTION: This exam was performed according to the departmental dose-optimization program which includes automated exposure control, adjustment of the mA and/or kV according to patient size and/or use of iterative reconstruction technique. CONTRAST:  OMNIPAQUE  IOHEXOL  350 MG/ML SOLN COMPARISON:  MRI brain 10/19/2023, CTA head and neck and reconstructions 11/08/2021, soft tissue neck CT 10/18/2023, CTA chest 10/18/2023 and  10/14/2023. FINDINGS: CT HEAD FINDINGS Brain: There is moderately advanced atrophy, small-vessel disease and atrophic ventriculomegaly. Multiple tiny chronic bilateral cerebellar lacunar infarcts are again noted, small chronic lacunar infarcts in the right thalamus and right pons, and a few bilateral chronic basal ganglia lacunae are again noted. No cortical based acute infarct, hemorrhage, mass or mass effect are seen. No new midline shift. Basal cisterns are clear. Vascular: Age advanced calcific plaque both siphons. No hyperdense vessel is seen. 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 If this is still present, it is not visible with noncontrast CT. Skull: Negative for fractures or focal lesions. Sinuses/Orbits: Negative orbits. Increased patchy opacification ethmoid air cells and increased membrane thickening both maxillary and frontal sinuses. The sphenoid sinuses clear. Again noted is a diffuse right mastoid effusion and partial opacification in the right middle ear. There is increased moderate patchy left mastoid effusion with clear left middle ear cavity. Other:  None. CT CERVICAL FINDINGS Alignment: There is a straightened slightly reversed cervical lordosis. There is no listhesis. No widening of the anterior atlantodental joint. No C1-2 lateral mass offset. Skull base and vertebrae: No acute fracture is evident. No primary bone lesion or focal pathologic process. Soft tissues and spinal canal: No prevertebral fluid or swelling. No visible canal hematoma. There is trace calcification at the carotid bifurcations. There is no thyroid  or laryngeal mass. Unremarkable parotid and submandibular glands. Disc levels: The C5-6 disc is degenerative with small bidirectional osteophytes. The other discs are normal in heights. No significant soft tissue or bony encroachment on the thecal sac and cord is seen. There are  slight facet spurring changes at most levels, uncinate spurring at C5-6. Foraminal stenosis is only seen at C5-6 where it is mild. The other foramina appear to be patent. Small anterior endplate spurring is seen at C3-4, C4-5 and C6-7 but no bulky osteophytes or bridging bone. Other:  None. CT CHEST FINDINGS Cardiovascular: There are extensive mediastinal, anterior chest wall and paraspinal venous collateral vessels filling with dense contrast. 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 The SVC was better demonstrated on last study due to the right-sided injection on that exam. Extensive but nonocclusive thrombus was previously noted in the right axillosubclavian vein and could still be present, but this is not evaluated due to the left-sided injection today. The distal SVC and cavoatrial junction, and the cardiac chambers opacify well. Nonocclusive thrombus in the azygous vein is also again noted. Partially occlusive thrombus also appears to still be present within an engorged right internal mammary vein on series 8 images 40-54. The heart is slightly enlarged, with left ventricular and septal hypertrophy and patchy calcification of the mitral ring. There is no enlargement of the right chambers. Small pericardial effusion is again noted. There are normal caliber pulmonary arteries, adequately opacified to the segmental level with no evidence of arterial embolism, but the subsegmental arteries largely unopacified and not evaluated. There are moderate patchy calcific plaques in the aorta and proximal great vessels skull with heavy calcifications and thickening of the aortic valve leaflets without aortic aneurysm, dissection or stenosis. There are scattered three-vessel coronary artery calcific plaques. The pulmonary veins are normal in caliber. Mediastinum/Nodes: Stable prominent right mid  hilar lymph node of 1.4 cm short axis on 8:54. Right paratracheal area is difficult to assess due to the engorged thrombosed SVC but no new abnormality is seen in the area. I do not see further adenopathy. Lungs/Pleura: Interval new small layering right pleural effusion. No left pleural effusion. Right upper lobe irregularly thick-walled cavitary lesion posteriorly again noted measuring 5.1 x 4.1 cm, not notably changed in size but with increased fluid now almost completely filling the cavity. Infectious complication not excluded. There are scarring changes, mild volume loss in the right middle lobe. Stable 7 Mullen ground-glass right upper lobe nodule on 10:66. Remaining lungs are generally clear. Upper abdomen: No acute abnormality. Musculoskeletal: No regional bone metastases. Review of the MIP images confirms the above findings. IMPRESSION: 1. No acute intracranial CT findings or depressed skull fractures. 2. Atrophy, small-vessel disease and multiple chronic lacunar infarcts. 3. Sinus disease with increased membrane thickening in the frontal and maxillary sinuses, increased patchy opacification of the ethmoid air cells. 4. Persistent right mastoid effusion and partial opacification of the right middle ear. Interval increased left mastoid effusion. 5.  No evidence of cervical fractures or malalignment. 6. C5-6 degenerative disc disease and spondylosis with mild foraminal stenosis. 7. No evidence of pulmonary arterial dilatation or embolus through the segmental level. Subsegmental arteries are largely unopacified and not evaluated. 8. 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 9. Not well evaluated, there may still be nonocclusive thrombus in the right axillosubclavian vein and azygous vein, and there is partially occlusive thrombus in the right internal mammary  vein. 10. Interval new small layering right pleural effusion. 11. 5.1 x 4.1 cm right upper lobe cavitary mass is now almost fully filled with fluid. Infectious complication not excluded. 12. Stable 7 Mullen ground-glass right upper lobe nodule. 13. Stable prominent right mid hilar lymph node. 14. Aortic and coronary artery atherosclerosis. Heavily calcified thickened aortic valve leaflets. Aortic Atherosclerosis (ICD10-I70.0). Electronically Signed   By: Francis Quam M.D.   On: 11/11/2023 20:59   CT Cervical Spine Wo Contrast Result Date: 11/11/2023 CLINICAL DATA:  Clemens 2 days ago with head and neck trauma and midline neck tenderness. Reports taking blood thinners. Increased leg weakness and swelling. Pulmonary embolism suspected. Prior history of lung cancer. EXAM: CT HEAD WITHOUT CONTRAST CT CERVICAL SPINE WITHOUT CONTRAST CT CHEST, ABDOMEN AND PELVIS WITH CONTRAST TECHNIQUE: Contiguous axial images were obtained from the base of the skull through the vertex without intravenous contrast. Multidetector CT imaging of the cervical spine was performed without intravenous contrast. Multiplanar CT image reconstructions were also generated. Multidetector CT imaging of the chest was performed following the standard protocol during bolus administration of intravenous contrast. Multiplanar CT image reconstructions and MIPS were obtained to evaluate the vascular anatomy. RADIATION DOSE REDUCTION: This exam was performed according to the departmental dose-optimization program which includes automated exposure control, adjustment of the mA and/or kV according to patient size and/or use of iterative reconstruction technique. CONTRAST:  OMNIPAQUE  IOHEXOL  350 MG/ML SOLN COMPARISON:  MRI brain 10/19/2023, CTA head and neck and reconstructions 11/08/2021, soft tissue neck CT 10/18/2023, CTA chest 10/18/2023 and 10/14/2023. FINDINGS: CT HEAD FINDINGS Brain: There is moderately advanced atrophy, small-vessel disease and  atrophic ventriculomegaly. Multiple tiny chronic bilateral cerebellar lacunar infarcts are again noted, small chronic lacunar infarcts in the right thalamus and right pons, and a few bilateral chronic basal ganglia lacunae are again noted. No cortical based acute infarct, hemorrhage, mass or mass effect are seen. No new midline shift. Basal cisterns are clear. Vascular: Age advanced calcific plaque both siphons. No hyperdense vessel is seen. 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 If this is still present, it is not visible with noncontrast CT. Skull: Negative for fractures or focal lesions. Sinuses/Orbits: Negative orbits. Increased patchy opacification ethmoid air cells and increased membrane thickening both maxillary and frontal sinuses. The sphenoid sinuses clear. Again noted is a diffuse right mastoid effusion and partial opacification in the right middle ear. There is increased moderate patchy left mastoid effusion with clear left middle ear cavity. Other: None. CT CERVICAL FINDINGS Alignment: There is a straightened slightly reversed cervical lordosis. There is no listhesis. No widening of the anterior atlantodental joint. No C1-2 lateral mass offset. Skull base and vertebrae: No acute fracture is evident. No primary bone lesion or focal pathologic process. Soft tissues and spinal canal: No prevertebral fluid or swelling. No visible canal hematoma. There is trace  calcification at the carotid bifurcations. There is no thyroid  or laryngeal mass. Unremarkable parotid and submandibular glands. Disc levels: The C5-6 disc is degenerative with small bidirectional osteophytes. The other discs are normal in heights. No significant soft tissue or bony encroachment on the thecal sac and cord is seen. There are slight facet spurring changes at most levels, uncinate spurring at C5-6. Foraminal stenosis is only seen at C5-6  where it is mild. The other foramina appear to be patent. Small anterior endplate spurring is seen at C3-4, C4-5 and C6-7 but no bulky osteophytes or bridging bone. Other:  None. CT CHEST FINDINGS Cardiovascular: There are extensive mediastinal, anterior chest wall and paraspinal venous collateral vessels filling with dense contrast. 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 The SVC was better demonstrated on last study due to the right-sided injection on that exam. Extensive but nonocclusive thrombus was previously noted in the right axillosubclavian vein and could still be present, but this is not evaluated due to the left-sided injection today. The distal SVC and cavoatrial junction, and the cardiac chambers opacify well. Nonocclusive thrombus in the azygous vein is also again noted. Partially occlusive thrombus also appears to still be present within an engorged right internal mammary vein on series 8 images 40-54. The heart is slightly enlarged, with left ventricular and septal hypertrophy and patchy calcification of the mitral ring. There is no enlargement of the right chambers. Small pericardial effusion is again noted. There are normal caliber pulmonary arteries, adequately opacified to the segmental level with no evidence of arterial embolism, but the subsegmental arteries largely unopacified and not evaluated. There are moderate patchy calcific plaques in the aorta and proximal great vessels skull with heavy calcifications and thickening of the aortic valve leaflets without aortic aneurysm, dissection or stenosis. There are scattered three-vessel coronary artery calcific plaques. The pulmonary veins are normal in caliber. Mediastinum/Nodes: Stable prominent right mid hilar lymph node of 1.4 cm short axis on 8:54. Right paratracheal area is difficult to assess due to the engorged  thrombosed SVC but no new abnormality is seen in the area. I do not see further adenopathy. Lungs/Pleura: Interval new small layering right pleural effusion. No left pleural effusion. Right upper lobe irregularly thick-walled cavitary lesion posteriorly again noted measuring 5.1 x 4.1 cm, not notably changed in size but with increased fluid now almost completely filling the cavity. Infectious complication not excluded. There are scarring changes, mild volume loss in the right middle lobe. Stable 7 Mullen ground-glass right upper lobe nodule on 10:66. Remaining lungs are generally clear. Upper abdomen: No acute abnormality. Musculoskeletal: No regional bone metastases. Review of the MIP images confirms the above findings. IMPRESSION: 1. No acute intracranial CT findings or depressed skull fractures. 2. Atrophy, small-vessel disease and multiple chronic lacunar infarcts. 3. Sinus disease with increased membrane thickening in the frontal and maxillary sinuses, increased patchy opacification of the ethmoid air cells. 4. Persistent right mastoid effusion and partial opacification of the right middle ear. Interval increased left mastoid effusion. 5. No evidence of cervical fractures or malalignment. 6. C5-6 degenerative disc disease and spondylosis with mild foraminal stenosis. 7. No evidence of pulmonary arterial dilatation or embolus through the segmental level. Subsegmental arteries are largely unopacified and not evaluated. 8. 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 9. Not well evaluated, there may still be nonocclusive thrombus in the right axillosubclavian vein and azygous vein, and there is partially occlusive thrombus in the right internal mammary vein. 10. Interval new small layering right pleural effusion. 11. 5.1 x 4.1 cm right upper lobe cavitary mass is  now almost fully filled with fluid. Infectious complication not excluded. 12. Stable 7 Mullen ground-glass right upper lobe nodule. 13. Stable prominent right mid hilar lymph node. 14. Aortic and coronary artery atherosclerosis. Heavily calcified thickened aortic valve leaflets. Aortic Atherosclerosis (ICD10-I70.0). Electronically Signed   By: Francis Quam M.D.   On: 11/11/2023 20:59   US  Venous Img Lower Right (DVT Study) Result Date: 11/11/2023 EXAM: ULTRASOUND DUPLEX OF THE RIGHT LOWER EXTREMITY VEINS TECHNIQUE: Duplex ultrasound using B-mode/gray scaled imaging and Doppler spectral analysis and color flow was obtained of the deep venous structures of the right lower extremity. COMPARISON: None. CLINICAL HISTORY: Swelling, lung cancer. FINDINGS: The common femoral vein, femoral vein, popliteal vein, and posterior tibial vein of the right lower extremity demonstrate normal compressibility with normal color flow and spectral analysis. IMPRESSION: 1. No evidence of DVT. Electronically signed by: Ozell Daring MD 11/11/2023 07:10 PM EDT RP Workstation: [id number]   CT ABDOMEN PELVIS W CONTRAST Result Date: 10/25/2023 EXAM: CT ABDOMEN AND PELVIS WITH CONTRAST 10/25/2023 06:32:57 PM TECHNIQUE: CT of the abdomen and pelvis was performed with the administration of intravenous contrast. Multiplanar reformatted images are provided for review. Automated exposure control, iterative reconstruction, and/or weight-based adjustment of the mA/kV was utilized to reduce the radiation dose to as low as reasonably achievable. COMPARISON: None available. CLINICAL HISTORY: Metastatic disease evaluation. History of lung cancer. Tracking code: Bo. FINDINGS: LOWER CHEST: Visualized lung bases are clear. LIVER: The liver is unremarkable. GALLBLADDER AND BILE DUCTS: Gallbladder is unremarkable. No biliary ductal dilatation. SPLEEN: No acute abnormality. PANCREAS: No acute abnormality. ADRENAL GLANDS: No acute abnormality. KIDNEYS,  URETERS AND BLADDER: No stones in the kidneys or ureters. No hydronephrosis. No perinephric or periureteral stranding. Urinary bladder is unremarkable. GI AND BOWEL: Moderate descending and sigmoid colonic diverticulosis. The stomach, small bowel, and large bowel are otherwise unremarkable. Normal appendix. PERITONEUM AND RETROPERITONEUM: No ascites. No free air. VASCULATURE: Mild aortoiliac atherosclerotic calcification. Extensive calcification of the aortic valve leaflets. Suspected bicuspid aortic valve. Calcification of the mitral valve leaflets. Global cardiac size within normal limits. Echocardiography may be helpful to better assess the aortic valve morphology and the degree of valvular dysfunction. LYMPH NODES: No lymphadenopathy. REPRODUCTIVE ORGANS: Partially calcified uterine fibroids result in a lobulated morphology of the uterus. No adnexal masses are seen. BONES AND SOFT TISSUES: Osseous structures are age appropriate. No acute bone abnormality. No lytic or blastic bone lesion. 4.2 cm rounded subdermal lesion is seen within the left gluteal region which may represent a sebaceous cyst, but is not well characterized on this examination. Umbilical/ventral hernia repair with mesh has been performed. No recurrent abdominal wall hernia. RAF SCORE: Aortic atherosclerosis (ICD10-I70.0) IMPRESSION: 1. No evidence of metastatic disease within the abdomen and pelvis. 2. Moderate descending and sigmoid colonic diverticulosis without evidence of diverticulitis. 3. Mild aortoiliac atherosclerotic calcification. Electronically signed by: Dorethia Molt MD 10/25/2023 09:28 PM EDT RP Workstation: [id number]   DG CHEST PORT 1 VIEW Result Date: 10/24/2023 CLINICAL DATA:  Shortness of breath. EXAM: PORTABLE CHEST 1 VIEW COMPARISON:  Chest radiograph dated 10/22/2023. FINDINGS: Cavitary mass in the right upper lobe measures 5.2 cm in diameter. No new consolidation. There is no pleural effusion  pneumothorax. The cardiac  silhouette is within normal limits. No acute osseous pathology. IMPRESSION: Cavitary mass in the right upper lobe. Electronically Signed   By: Vanetta Chou M.D.   On: 10/24/2023 13:50   DG CHEST PORT 1 VIEW Result Date: 10/22/2023 CLINICAL DATA:  Status post bronchoscopy EXAM: PORTABLE CHEST 1 VIEW COMPARISON:  October 18, 2023 FINDINGS: Redemonstration of cavitary right upper lobe lung mass. No new consolidations. No pleural effusions. No pneumothorax. Unchanged cardiomediastinal silhouette with known right paratracheal lymphadenopathy. No acute osseous findings. IMPRESSION: Unchanged right upper lobe cavitary lung mass and right paratracheal lymphadenopathy. No acute findings. Electronically Signed   By: Michaeline Blanch M.D.   On: 10/22/2023 11:58   ECHOCARDIOGRAM COMPLETE Result Date: 10/20/2023    ECHOCARDIOGRAM REPORT   Patient Name:   KEYARI KLEEMAN Dobbs Date of Exam: 10/20/2023 Medical Rec #:  [id number]        Height:       65.0 in Accession #:    [id number]       Weight:       216.3 lb Date of Birth:  November 23, 1965        BSA:          2.045 m Patient Age:    57 years         BP:           104/73 mmHg Patient Gender: F                HR:           105 bpm. Exam Location:  Inpatient Procedure: 2D Echo, Cardiac Doppler and Color Doppler (Both Spectral and Color            Flow Doppler were utilized during procedure). Indications:    Pulmonary Embolus  History:        Patient has prior history of Echocardiogram examinations, most                 recent 11/09/2021. CAD, Stroke, Signs/Symptoms:Chest Pain; Risk                 Factors:Hypertension and Current Smoker.  Sonographer:    Juliene Rucks Referring Phys: [phone] RONDELL A SMITH IMPRESSIONS  1. Left ventricular ejection fraction, by estimation, is 60 to 65%. The left ventricle has normal function. The left ventricle has no regional wall motion abnormalities. Left ventricular diastolic parameters are consistent with Grade I diastolic dysfunction (impaired  relaxation). Elevated left ventricular end-diastolic pressure.  2. Right ventricular systolic function was not well visualized. The right ventricular size is not well visualized.  3. The mitral valve is degenerative. Trivial mitral valve regurgitation. Moderate to severe mitral annular calcification.  4. The aortic valve was not adequately interrogated to determine stenosis - there is calcification of the valve and aortic root. Image 67 demonstrates an aortic valve gradient of at least 30 mmHg peak and 14 mmHg, mean.. The aortic valve has an indeterminant number of cusps. There is severe calcifcation of the aortic valve. Aortic valve regurgitation is trivial. Moderate to severe aortic valve stenosis.  5. The inferior vena cava is normal in size with greater than 50% respiratory variability, suggesting right atrial pressure of 3 mmHg. Comparison(s): Changes from prior study are noted. 11/09/2021: LVEF 60-65%, severe AS - possibly bicuspid aortic valve. Conclusion(s)/Recommendation(s): Consider further evaluation of the aortic valve with TEE. FINDINGS  Left Ventricle: Left ventricular ejection fraction, by estimation, is 60 to 65%. The left ventricle has normal function.  The left ventricle has no regional wall motion abnormalities. The left ventricular internal cavity size was normal in size. There is  no left ventricular hypertrophy. Left ventricular diastolic parameters are consistent with Grade I diastolic dysfunction (impaired relaxation). Elevated left ventricular end-diastolic pressure. Right Ventricle: The right ventricular size is not well visualized. Right vetricular wall thickness was not well visualized. Right ventricular systolic function was not well visualized. Left Atrium: Left atrial size was normal in size. Right Atrium: Right atrial size was normal in size. Pericardium: There is no evidence of pericardial effusion. Mitral Valve: The mitral valve is degenerative in appearance. Moderate to severe mitral  annular calcification. Trivial mitral valve regurgitation. Tricuspid Valve: The tricuspid valve is not well visualized. Tricuspid valve regurgitation is not demonstrated. Aortic Valve: The aortic valve was not adequately interrogated to determine stenosis - there is calcification of the valve and aortic root. Image 67 demonstrates an aortic valve gradient of at least 30 mmHg peak and 14 mmHg, mean. The aortic valve has an  indeterminant number of cusps. There is severe calcifcation of the aortic valve. There is moderate aortic valve annular calcification. Aortic valve regurgitation is trivial. Moderate to severe aortic stenosis is present. Pulmonic Valve: The pulmonic valve was not well visualized. Pulmonic valve regurgitation is not visualized. Aorta: The aortic root and ascending aorta are structurally normal, with no evidence of dilitation. Venous: The inferior vena cava is normal in size with greater than 50% respiratory variability, suggesting right atrial pressure of 3 mmHg. IAS/Shunts: The interatrial septum was not well visualized.  LEFT VENTRICLE PLAX 2D LVIDd:         5.20 cm   Diastology LVIDs:         4.60 cm   LV e' medial:    4.24 cm/s LV PW:         0.80 cm   LV E/e' medial:  30.2 LV IVS:        0.90 cm   LV e' lateral:   7.07 cm/s LVOT diam:     1.70 cm   LV E/e' lateral: 18.1 LVOT Area:     2.27 cm  RIGHT VENTRICLE RV S prime:     5.34 cm/s LEFT ATRIUM           Index LA diam:      3.00 cm 1.47 cm/m LA Vol (A2C): 48.1 ml 23.52 ml/m LA Vol (A4C): 40.9 ml 20.00 ml/m   AORTA Ao Root diam: 2.60 cm MITRAL VALVE MV Area (PHT): 4.39 cm     SHUNTS MV Decel Time: 173 msec     Systemic Diam: 1.70 cm MV E velocity: 128.00 cm/s MV A velocity: 162.00 cm/s MV E/A ratio:  0.79 Vinie Maxcy MD Electronically signed by Vinie Maxcy MD Signature Date/Time: 10/20/2023/6:31:01 PM    Final    VAS US  LOWER EXTREMITY VENOUS (DVT) Result Date: 10/20/2023  Lower Venous DVT Study Patient Name:  FAVIOLA KLARE   Date of Exam:   10/20/2023 Medical Rec #: [id number]         Accession #:    [id number] Date of Birth: 1966-01-17         Patient Gender: F Patient Age:   38 years Exam Location:  [facility] Procedure:      VAS US  LOWER EXTREMITY VENOUS (DVT) Referring Phys: MAXIMINO SHARPS --------------------------------------------------------------------------------  Indications: Pulmonary embolism.  Performing Technologist: Elmarie Lindau, RVT  Examination Guidelines: A complete evaluation includes B-mode imaging, spectral Doppler, color Doppler, and  power Doppler as needed of all accessible portions of each vessel. Bilateral testing is considered an integral part of a complete examination. Limited examinations for reoccurring indications may be performed as noted. The reflux portion of the exam is performed with the patient in reverse Trendelenburg.  +---------+---------------+---------+-----------+----------+--------------+ RIGHT    CompressibilityPhasicitySpontaneityPropertiesThrombus Aging +---------+---------------+---------+-----------+----------+--------------+ CFV      Full           Yes      Yes                                 +---------+---------------+---------+-----------+----------+--------------+ SFJ      Full                                                        +---------+---------------+---------+-----------+----------+--------------+ FV Prox  Full                                                        +---------+---------------+---------+-----------+----------+--------------+ FV Mid   Full                                                        +---------+---------------+---------+-----------+----------+--------------+ FV DistalFull                                                        +---------+---------------+---------+-----------+----------+--------------+ PFV      Full                                                         +---------+---------------+---------+-----------+----------+--------------+ POP      Partial                                      Chronic        +---------+---------------+---------+-----------+----------+--------------+ PTV      Full                                                        +---------+---------------+---------+-----------+----------+--------------+ PERO     Full                                                        +---------+---------------+---------+-----------+----------+--------------+   +---------+---------------+---------+-----------+----------+--------------+ LEFT  CompressibilityPhasicitySpontaneityPropertiesThrombus Aging +---------+---------------+---------+-----------+----------+--------------+ CFV      Full           Yes      Yes                                 +---------+---------------+---------+-----------+----------+--------------+ SFJ      Full                                                        +---------+---------------+---------+-----------+----------+--------------+ FV Prox  Full                                                        +---------+---------------+---------+-----------+----------+--------------+ FV Mid   Full                                                        +---------+---------------+---------+-----------+----------+--------------+ FV DistalFull                                                        +---------+---------------+---------+-----------+----------+--------------+ PFV      Full                                                        +---------+---------------+---------+-----------+----------+--------------+ POP      Partial                                      Chronic        +---------+---------------+---------+-----------+----------+--------------+ PTV      Full                                                         +---------+---------------+---------+-----------+----------+--------------+ PERO     Full                                                        +---------+---------------+---------+-----------+----------+--------------+     Summary: RIGHT: - Findings consistent 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  - No cystic structure found in the popliteal fossa.  LEFT: - Findings consistent with chronic deep vein thrombosis involving the left popliteal vein.  - No cystic structure found in the popliteal fossa.  *See  table(s) above for measurements and observations. Electronically signed by Lonni Gaskins MD on 10/20/2023 at 2:53:16 PM.    Final    MR Brain W and Wo Contrast Result Date: 10/19/2023 CLINICAL DATA:  Initial evaluation for dural venous sinus thrombosis, acute neuro deficit. EXAM: MRI HEAD WITHOUT AND WITH CONTRAST TECHNIQUE: Multiplanar, multiecho pulse sequences of the brain and surrounding structures were obtained without and with intravenous contrast. CONTRAST:  10mL GADAVIST  GADOBUTROL  1 MMOL/ML IV SOLN COMPARISON:  Comparison made with CT from earlier the same day as well as multiple previous exams. FINDINGS: Brain: Diffuse prominence of the CSF containing spaces compatible generalized cerebral atrophy. Patchy and confluent T2/FLAIR hyperintensity involving the periventricular deep white matter both cerebral hemispheres, consistent with chronic microvascular ischemic disease, moderately advanced in nature. Mild patchy involvement of the pons and cerebellum noted. Few remote lacunar infarcts noted about the bilateral basal ganglia. Remote right pontine infarct with a few scattered small remote bilateral cerebellar infarcts. 6 Mullen acute ischemic nonhemorrhagic infarcts seen involving the right splenium (series 5, image 75). No other evidence for acute or subacute ischemia. Gray-white matter differentiation otherwise maintained. No acute intracranial hemorrhage. Multiple  scattered chronic micro hemorrhages noted, likely hypertensive in nature. No mass lesion, midline shift, or mass effect. Mild ventricular prominence related to global parenchymal volume loss of hydrocephalus. No extra-axial fluid collection. Pituitary gland and suprasellar region within normal limits. There is an apparent 6 Mullen focus of enhancement involving the right pons (series 17, image 14). This is at the location of an underlying right pontine infarct as well as a few prominent chronic micro hemorrhages. Given this, finding is suspected to reflect enhancement related to a small of all vein subacute infarct or possibly artifact related to the adjacent microhemorrhage. No other abnormal enhancement elsewhere within the brain. Vascular: Major intracranial arterial vascular flow voids are maintained. Previously identified dural venous sinus thrombosis involving the right transverse and sigmoid sinuses with extension into the right jugular bulb and visualized proximal right internal jugular vein, corresponding with findings on recent exams. Overall, appearance is relatively similar. Skull and upper cervical spine: Craniocervical junction within normal limits. Heterogeneous and mildly decreased T1 signal intensity within the visualized bone marrow, nonspecific, but most commonly related to anemia, smoking or obesity. No scalp soft tissue abnormality. Sinuses/Orbits: Globes orbital soft tissues within normal limits. Mild chronic mucosal thickening noted about the paranasal sinuses. Moderate to large right mastoid and middle ear effusion. Trace left mastoid effusion noted. Other: None. IMPRESSION: 1. 6 Mullen acute ischemic nonhemorrhagic infarct involving the right splenium. 2. 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 Overall, appearance is relatively stable from prior. 3. 6 Mullen  focus of enhancement involving the right pons, favored to reflect enhancement related to a small subacute infarct or possibly artifact related to the adjacent microhemorrhage. Short interval follow-up MRI in 3 months suggested to ensure resolution and/or stability. 4. Underlying age-related cerebral atrophy with moderately advanced chronic microvascular ischemic disease, with a few scattered remote lacunar infarcts involving the bilateral basal ganglia, right pons, and bilateral cerebellar hemispheres. 5. Multiple scattered chronic micro hemorrhages, likely hypertensive in nature. Electronically Signed   By: Morene Hoard M.D.   On: 10/19/2023 23:23   CT VENOGRAM HEAD Result Date: 10/19/2023 CLINICAL DATA:  58 year old female status post fall. Pain. Prior stroke. Left side weakness and numbness. History of lung cancer. Head  and neck CT CT with contrast yesterday demonstrating central venous thrombosis, right IJ thrombosis involving the right transverse and sigmoid sinus also. EXAM: CT VENOGRAM HEAD TECHNIQUE: Venographic phase images of the brain were obtained following the administration of intravenous contrast. Multiplanar reformats and maximum intensity projections were generated. RADIATION DOSE REDUCTION: This exam was performed according to the departmental dose-optimization program which includes automated exposure control, adjustment of the mA and/or kV according to patient size and/or use of iterative reconstruction technique. CONTRAST:  75mL OMNIPAQUE  IOHEXOL  300 MG/ML  SOLN COMPARISON:  Head and neck CT yesterday.  Brain MRI 11/09/2021. FINDINGS: CT HEAD Post-contrast only. Stable cerebral volume. Chronic lacunar infarcts in the bilateral brain. No evidence of intracranial mass effect, acute ventriculomegaly, acute intracranial hemorrhage, acute cerebral edema. CTV HEAD Venous sinuses: Maintained enhancement and patency of the superior sagittal sinus, torcula, straight sinus, inferior sagittal  sinus, internal cerebral veins. Maintained enhancement and patency of the left transverse, left sigmoid venous sinus, and visible left IJ bulb. Cavernous sinus enhancement appears symmetric and within normal limits also. Nonenhancing, thrombosed right IJ bulb, right sigmoid venous sinus, and distal half of the right transverse sinus (beginning on series 2, image 25. The medial right transverse sinus remains enhancing. The appearance is unchanged from head and neck CT yesterday. Anatomic variants: None significant. Other findings: Calcified atherosclerosis at the skull base. Review of the MIP images confirms the above findings IMPRESSION: 1. Stable Thrombosis of the right IJ, right sigmoid sinus, and lateral half of the right transverse dural venous sinus since the CT Head and Neck yesterday. 2. No other intracranial venous thrombosis identified. No intracranial mass effect, hemorrhage, or acute cerebral edema identified. Electronically Signed   By: VEAR Hurst M.D.   On: 10/19/2023 08:20   CT Soft Tissue Neck W Contrast Result Date: 10/18/2023 CLINICAL DATA:  Initial evaluation for acute facial swelling. EXAM: CT NECK WITH CONTRAST TECHNIQUE: Multidetector CT imaging of the neck was performed using the standard protocol following the bolus administration of intravenous contrast. RADIATION DOSE REDUCTION: This exam was performed according to the departmental dose-optimization program which includes automated exposure control, adjustment of the mA and/or kV according to patient size and/or use of iterative reconstruction technique. CONTRAST:  OMNIPAQUE  IOHEXOL  350 MG/ML SOLN COMPARISON:  None Available. FINDINGS: Pharynx and larynx: Oral cavity within normal limits. Oropharynx and nasopharynx within normal limits. No retropharyngeal collection or swelling. Negative epiglottis. Hypopharynx and supraglottic larynx within normal limits. Negative glottis. Subglottic airway clear. Salivary glands: Salivary glands  including the parotid and submandibular glands are within normal limits. Thyroid : Normal. Lymph nodes: No enlarged or pathologic lymph nodes within the neck. Vascular: Occlusive thrombus seen involving the brachiocephalic vein, extending into the visualized SVC. The vessels are expanded with hazy inflammatory stranding, consistent with acute thrombus. Clot extends to partially involve the right greater than left subclavian veins as well as the right axillary vein. Cephalad extension with subocclusive thrombus extending throughout the right internal jugular vein, with extension into the cranial vault to involve the right transverse and sigmoid sinuses. Left IJ remains patent. Multiple prominent venous collaterals noted within the right neck and visualized right chest wall. Normal arterial enhancement seen throughout the neck. Aortic atherosclerosis noted. Limited intracranial: No acute finding. Visualized orbits: No acute finding. Mastoids and visualized paranasal sinuses: Mild mucoperiosteal thickening about the ethmoidal air cells and maxillary sinuses. Right mastoid and middle ear effusion. Left mastoid air cells are clear. Skeleton: No worrisome osseous lesions. Upper chest: Approximate 5  cm thick walled cavitary mass at the posterior right upper lobe, partially visualize, better evaluated on concomitant CT of the chest. Other: None. IMPRESSION: 1. Acute occlusive and subocclusive thrombus involving the brachiocephalic vein, extending into the visualized SVC. Clot extends to partially involve the right greater than left subclavian veins as well as the right axillary vein. Cephalad extension with subocclusive thrombus extending throughout the right internal jugular vein, with extension into the cranial vault to involve the right transverse and sigmoid sinuses. 2. Approximate 5 cm thick walled cavitary mass at the posterior right upper lobe, partially visualized, better evaluated on concomitant CT of the chest. 3.  Right mastoid and middle ear effusion, of uncertain significance. Correlation with physical exam recommended. 4.  Aortic Atherosclerosis (ICD10-I70.0). Electronically Signed   By: Morene Hoard M.D.   On: 10/18/2023 19:21   CT Head W or Wo Contrast Result Date: 10/18/2023 CLINICAL DATA:  Initial evaluation for acute headache, history of malignancy. EXAM: CT HEAD WITHOUT AND WITH CONTRAST TECHNIQUE: Contiguous axial images were obtained from the base of the skull through the vertex without and with intravenous contrast. RADIATION DOSE REDUCTION: This exam was performed according to the departmental dose-optimization program which includes automated exposure control, adjustment of the mA and/or kV according to patient size and/or use of iterative reconstruction technique. CONTRAST:  OMNIPAQUE  IOHEXOL  350 MG/ML SOLN COMPARISON:  Prior CT from 09/20/2023 FINDINGS: Brain: Generalized age-related cerebral atrophy. Patchy and confluent hypodensity involving the supratentorial cerebral white matter, consistent with chronic small vessel ischemic disease, moderate in nature. Multiple scattered remote lacunar infarcts noted about the bilateral basal ganglia right thalamus, stable. No acute intracranial hemorrhage. No acute large vessel territory infarct. No mass lesion or midline shift. No hydrocephalus or extra-axial fluid collection. Vascular: No abnormal hyperdense vessel seen prior to contrast administration. Calcified atherosclerosis present at the skull base. Following contrast administration, filling defect within the right transverse and sigmoid sinuses extending into the right jugular bulb and visualized proximal right internal jugular vein, consistent with dural venous sinus thrombosis. This appears to extend inferiorly throughout the right IJ within the neck. Skull: Scalp soft tissues demonstrate no acute finding. Calvarium intact. Sinuses/Orbits: Globes orbital soft tissues within normal limits. Mild  mucoperiosteal thickening present about the paranasal sinuses. Moderate right mastoid and middle ear effusion. Left mastoid air cells are clear. Other: None. IMPRESSION: 1. Filling defect within the right transverse and sigmoid sinuses extending into the right jugular bulb and visualized proximal right internal jugular vein, consistent with dural venous sinus thrombosis. This extends inferiorly throughout the right IJ within the neck. 2. No other acute intracranial abnormality. 3. Moderate right mastoid and middle ear effusion, of uncertain significance. Correlation with physical exam recommended. 4. Underlying age-related cerebral atrophy with moderate chronic small vessel ischemic disease, with multiple remote lacunar infarcts about the bilateral basal ganglia and right thalamus. Electronically Signed   By: Morene Hoard M.D.   On: 10/18/2023 19:11   CT Angio Chest PE W and/or Wo Contrast Addendum Date: 10/18/2023 ADDENDUM REPORT: 10/18/2023 18:55 ADDENDUM: Results were discussed with Dr. Dreama at 6:41 p.m. [place] on October 18, 2023. Electronically Signed   By: Suzen Dials M.D.   On: 10/18/2023 18:55   Result Date: 10/18/2023 CLINICAL DATA:  History of lung cancer presenting with chest tightness. EXAM: CT ANGIOGRAPHY CHEST WITH CONTRAST TECHNIQUE: Multidetector CT imaging of the chest was performed using the standard protocol during bolus administration of intravenous contrast. Multiplanar CT image reconstructions and MIPs were obtained  to evaluate the vascular anatomy. RADIATION DOSE REDUCTION: This exam was performed according to the departmental dose-optimization program which includes automated exposure control, adjustment of the mA and/or kV according to patient size and/or use of iterative reconstruction technique. CONTRAST:  OMNIPAQUE  IOHEXOL  350 MG/ML SOLN COMPARISON:  October 14, 2023 FINDINGS: Cardiovascular: There is marked severity calcification of the aortic arch without  evidence of aortic aneurysm or dissection. The segmental and subsegmental pulmonary arteries are limited in evaluation secondary to suboptimal opacification with intravenous contrast. As result, pulmonary embolism within the segmental branches of the right lower lobe cannot be excluded (axial CT images 132 through 142, CT series 6). Occlusive thrombus is noted within the brachiocephalic vein. This is present on the prior study. Nonocclusive thrombus is seen within the superior vena cava (axial CT images 39 through 45, CT series 5). Nonocclusive thrombus is also noted within the azygous vein (axial CT images 44 through 78, CT series 5). A partially thrombosed venous structure is seen within the anteromedial aspect of the upper right lung (axial CT image 44 through 53, CT series 5). Extensive, nonocclusive thrombus is noted within the right axillary vein and right subclavian vein (axial CT images 5 through 52, CT series 5) Numerous thin, tortuous, enhancing venous structures are seen within the anterior and posterior aspects of the chest wall on the right. Normal heart size with mild to moderate severity coronary artery calcification and marked severity calcification of the mitral annulus. No pericardial effusion. Mediastinum/Nodes: There is mild right hilar lymphadenopathy. Thyroid  gland, trachea, and esophagus demonstrate no significant findings. Lungs/Pleura: A 4.4 cm x 4.8 cm x 4.6 cm thick walled cavitary lesion is again seen within the posterior aspect of the right upper lobe. Mild anteromedial right middle lobe linear atelectasis is noted. No pleural effusion or pneumothorax identified. Upper Abdomen: There is a small hiatal hernia. Diffuse fatty infiltration of the liver parenchyma is noted. Noninflamed diverticula are seen throughout the visualized portions of the large bowel. Musculoskeletal: No chest wall abnormality. No acute or significant osseous findings. Review of the MIP images confirms the above  findings. IMPRESSION: 1. Limited evaluation of the segmental and subsegmental pulmonary arteries secondary to suboptimal opacification with intravenous contrast. As result, pulmonary embolism within the segmental branches of the right lower lobe cannot be excluded. 2. Occlusive thrombus within the brachiocephalic vein, nonocclusive thrombus within the superior vena cava and azygous vein, and extensive, nonocclusive thrombus within the right axillary vein and right subclavian vein. 3. 4.4 cm x 4.8 cm x 4.6 cm thick walled cavitary lesion within the posterior aspect of the right upper lobe which is seen on the prior study and may be consistent with the patient's history of lung cancer. 4. Mild right hilar lymphadenopathy. 5. Small hiatal hernia. 6. Hepatic steatosis. 7. Colonic diverticulosis. 8. Aortic atherosclerosis. Electronically Signed: By: Suzen Dials M.D. On: 10/18/2023 18:52   DG Chest 2 View Result Date: 10/18/2023 CLINICAL DATA:  Chest pain EXAM: CHEST - 2 VIEW COMPARISON:  Chest radiograph October 14, 2023, CT angio chest October 14, 2023 FINDINGS: Thick-walled cavitary lesion with air-fluid level in right upper lobe, measuring approximately 5 cm. No new lesion or consolidation identified. Cardiomediastinal silhouette is enlarged. Fullness of right paratracheal correlating to mediastinal lymphadenopathy. No pleural effusion or pneumothorax. Multilevel degenerative changes of the spine. Nodular opacity superimposing lower thoracic spine on lateral view correlates to osteophytes better assessed on recent CT angio chest. IMPRESSION: Thick-walled cavitary lesion of right upper lobe. Right lower paratracheal  mediastinal lymphadenopathy. Electronically Signed   By: Megan  Zare M.D.   On: 10/18/2023 14:34   CT Angio Chest PE W and/or Wo Contrast Result Date: 10/14/2023 CLINICAL DATA:  Increased shortness of breath, history of lung cancer in 2008 normal pulmonary embolism suspected, positive D-dimer  EXAM: CT ANGIOGRAPHY CHEST WITH CONTRAST TECHNIQUE: Multidetector CT imaging of the chest was performed using the standard protocol during bolus administration of intravenous contrast. Multiplanar CT image reconstructions and MIPs were obtained to evaluate the vascular anatomy. RADIATION DOSE REDUCTION: This exam was performed according to the departmental dose-optimization program which includes automated exposure control, adjustment of the mA and/or kV according to patient size and/or use of iterative reconstruction technique. CONTRAST:  75mL OMNIPAQUE  IOHEXOL  350 MG/ML SOLN COMPARISON:  Same day chest radiograph, November 09, 2021 CT FINDINGS: Cardiovascular: Satisfactory opacification of the pulmonary arteries to the segmental level. No evidence of pulmonary embolism. No pericardial effusion. Nonspecific narrowing of left brachiocephalic vein and upper SVC due to mediastinal lymphadenopathy with venous reflux into the left chest wall collaterals. Mediastinum/Nodes: Bulky right paratracheal lymphadenopathy, measuring up to 3.1 x 2.5 cm there are also some mildly prominent, subcentimeter bilateral supraclavicular lymph nodes. Lungs/Pleura: Thick-walled, cavitary right upper lobe mass measuring approximately 5.7 x 5.3 cm. There was a small pulmonary nodule at this location on prior CT performed November 09, 2021. No pleural effusions. No pneumothorax. Upper Abdomen: No acute findings. Musculoskeletal: No acute osseous findings. Review of the MIP images confirms the above findings. IMPRESSION: 1. Cavitary right upper lobe mass and bulky mediastinal lymphadenopathy, concerning for primary lung malignancy. Tissue biopsy may be helpful to confirm diagnosis and exclude possible mycobacterial infection. 2. Nonspecific narrowing of upper SVC and brachiocephalic veins due to mediastinal lymphadenopathy. 3. Nonspecific, mildly prominent subcentimeter bilateral supraclavicular lymph nodes, felt to be reactive related to the  central venous stenosis, but difficult to exclude metastatic disease. Electronically Signed   By: Michaeline Blanch M.D.   On: 10/14/2023 12:49   DG Chest 2 View Result Date: 10/14/2023 CLINICAL DATA:  SOB EXAM: CHEST - 2 VIEW COMPARISON:  January 29, 2022 FINDINGS: Thick-walled cavity in the right upper lobe, measuring 5.5 cm. No lobar consolidation, pleural effusion, or pneumothorax. Fullness of the right suprahilar region. No cardiomegaly. Aortic atherosclerosis. No acute fracture or destructive lesions. Multilevel thoracic osteophytosis. IMPRESSION: 1. Thick-walled cavity in the right upper lobe, measuring 5.5 cm. This may represent either TB or neoplasm. Appropriate isolation precautions. 2. Fullness of the right suprahilar region is also present, worrisome for lymphadenopathy. A follow-up chest CT with IV contrast recommended for further characterization. Critical Value/emergent results were called by telephone at the time of interpretation on 10/14/2023 at 8:36 am to provider ANDREW TEE , who verbally acknowledged these results. Electronically Signed   By: Rogelia Myers M.D.   On: 10/14/2023 08:37      IMPRESSION/PLAN:***    On date of service, in total, I spent *** minutes on this encounter. Patient was seen in person.   __________________________________________   Lauraine Golden, MD  This document serves as a record of services personally performed by Lauraine Golden, MD. It was created on her behalf by Dorthy Fuse, a trained medical scribe. The creation of this record is based on the scribe's personal observations and the provider's statements to them. This document has been checked and approved by the attending provider.

## 2023-11-12 NOTE — ED Notes (Signed)
 [facility] in ED preparing pt for transfer

## 2023-11-12 NOTE — Assessment & Plan Note (Addendum)
 Bronchoscopy during recent hospitalization (negative fungal, acid fast smear negative and cx pending)  Now nearly fully filled with fluid and infectious complication not excluded Given rocephin  and azithromycin  in ED  Change to cefepime and vanc given recent 10 day abx/hospitalization with bronch  Trend PCT

## 2023-11-12 NOTE — ED Notes (Signed)
 Patient given permission to take home meds by Dr. Geroldine. Took flexeril , eliquis  and Vicodin.

## 2023-11-12 NOTE — TOC Initial Note (Signed)
 Transition of Care [facility]) - Initial/Assessment Note    Patient Details  Name: Andrea Mullen MRN: [id number] Date of Birth: March 13, 1965  Transition of Care [facility]) CM/SW Contact:    Toy LITTIE Agar, RN Phone Number:[phone]  11/12/2023, 2:46 PM  Clinical Narrative:                 IP Care manager acknowledges consult for placement. CM can not initiate placement until after PT evaluation for recommendations. IPCM following.     Barriers to Discharge: Continued Medical Work up   Patient Goals and CMS Choice            Expected Discharge Plan and Services                                              Prior Living Arrangements/Services                       Activities of Daily Living   ADL Screening (condition at time of admission) Independently performs ADLs?: No Is the patient deaf or have difficulty hearing?: No Does the patient have difficulty seeing, even when wearing glasses/contacts?: No Does the patient have difficulty concentrating, remembering, or making decisions?: No  Permission Sought/Granted                  Emotional Assessment              Admission diagnosis:  Weakness [R53.1] Patient Active Problem List   Diagnosis Date Noted   History of CVA (cerebrovascular accident) 11/12/2023   Normocytic anemia 11/12/2023   Weakness and falls 11/11/2023   Non-small cell lung cancer (HCC) 11/03/2023   Goals of care, counseling/discussion 11/03/2023   Acute embolism and thrombosis of right subclavian vein (HCC) 10/25/2023   Acute respiratory distress 10/19/2023   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   Venous thrombosis 10/19/2023   Acute thrombosis of right axillary vein (HCC) 10/19/2023   Subclavian vein thrombosis (HCC) 10/19/2023   History of lung cancer 10/19/2023   Left leg weakness 10/19/2023   Mediastinal lymphadenopathy 10/19/2023   Cavitary lesion of lung  10/19/2023   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   Aortic stenosis 11/10/2021   Stroke (HCC) 11/08/2021   CAD (coronary artery disease) 11/08/2021   Microcytic anemia 11/08/2021   Asthma, chronic 11/08/2021   HTN (hypertension) 11/08/2021   HLD (hyperlipidemia) 11/08/2021   Elevated troponin    NSTEMI (non-ST elevated myocardial infarction) (HCC)    Polysubstance abuse (HCC)    Anxiety state    Atypical chest pain 10/30/2014   Smoking 10/30/2014   Chest pain 10/30/2014   Homicidal ideation    MALIGNANT NEOPLASM BRONCHUS&LUNG UNSPEC SITE 07/11/2007   Morbid obesity (HCC) 07/11/2007   TOBACCO ABUSE-HISTORY OF 07/11/2007   Cavitating mass in right upper lung lobe 06/07/2007   PCP:  [facility]:   [id number] [place], [place] - [address] AT [address] [place] [address] Phone: [phone] Fax: [phone]  [facility] [address] [place] [address] Phone: [phone] Fax: [phone]  [facility] - [facility] [address] [place] [address] Phone: [phone] Fax: [phone]     Social Drivers of Health (SDOH) Social History: SDOH Screenings  Food Insecurity: No Food Insecurity (11/12/2023)  Recent Concern: Food Insecurity - Food Insecurity Present (10/05/2023)   Received from [facility]  Housing: Low Risk  (11/12/2023)  Recent Concern: Housing - High Risk (10/05/2023)   Received from [facility]  Transportation Needs: No Transportation Needs (11/12/2023)  Utilities: Not At Risk (11/12/2023)  Depression (PHQ2-9): Low Risk  (11/03/2023)  Financial Resource Strain: Medium Risk (10/05/2023)   Received from [facility]  Physical Activity: Inactive (10/05/2023)   Received from [facility]  Social Connections: Somewhat Isolated (10/05/2023)   Received from [facility]  Stress: Stress Concern Present (10/05/2023)   Received from  [facility]  Tobacco Use: High Risk (11/11/2023)   SDOH Interventions:     Readmission Risk Interventions    10/27/2023   12:49 PM  Readmission Risk Prevention Plan  Transportation Screening Complete  PCP or Specialist Appt within 3-5 Days Complete  HRI or Home Care Consult Complete  Social Work Consult for Recovery Care Planning/Counseling Complete  Palliative Care Screening Not Applicable  Medication Review Oceanographer) Complete

## 2023-11-12 NOTE — Progress Notes (Signed)
 Pt refused labs MD [place] notified

## 2023-11-12 NOTE — Assessment & Plan Note (Signed)
 Continue medical management

## 2023-11-12 NOTE — Assessment & Plan Note (Signed)
 Stable. Iron studies done 10/2023

## 2023-11-12 NOTE — Assessment & Plan Note (Addendum)
 Echo 10/2023: shows EF 65%, grade 1 DD, moderate to severe aortic valve stenosis. Aortic valve with indeterminate number of cusps.  Could be contributing to her fatigue  See cards outpatient

## 2023-11-12 NOTE — Progress Notes (Signed)
 Pharmacy Antibiotic Note  Andrea Mullen is a 58 y.o. female admitted on 11/11/2023 with fluid filled cavitary mass in right upper lobe of lung.  MD broadening antibiotics. Pharmacy has been consulted for cefepime, vancomycin dosing.  Today, 11/12/23 WBC WNL, afebrile SCr <1  Plan: Cefepime 2 g IV q8h Vancomycin 1500 mg IV q24h for estimated AUC of 420 Goal AUC 400-550. Check levels as needed MRSA PCR ordered Follow renal function, culture data  Height: 5' 5 (165.1 cm) Weight: 79.4 kg (175 lb) IBW/kg (Calculated) : 57  Temp (24hrs), Avg:98.2 F (36.8 C), Min:97.7 F (36.5 C), Max:98.5 F (36.9 C)  Recent Labs  Lab 11/11/23 1834  WBC 4.6  CREATININE 0.73    Estimated Creatinine Clearance: 80.8 mL/min (by C-G formula based on SCr of 0.73 mg/dL).    Allergies  Allergen Reactions   Bicillin C-R Hives   Penicillins Hives        Nsaids Hives   Prednisone  Other (See Comments)    Change in mental status     Antimicrobials this admission: Ceftriaxone  + azithromycin  10/2 vancomycin 10/3 >>  Cefepime 10/3 >>  Microbiology results: None  Ronal CHRISTELLA Rav, PharmD 11/12/2023 3:07 PM

## 2023-11-12 NOTE — Consult Note (Addendum)
 Radiation Oncology         [phone]) [phone] ________________________________  Initial inpatient Consultation  Name: Andrea Mullen MRN: [id number]  Date: 11/11/2023  DOB: 1966-01-16  RR:[facility], [facility]  No ref. provider found   REFERRING PHYSICIAN: MOHAMED MOHAMED MD  DIAGNOSIS: Code: C34.11 Primary cancer of right upper lobe of lung (HCC)     Cancer Staging  Non-small cell lung cancer (HCC) Staging form: Lung, AJCC V9 - Clinical: Stage IIIB (cT3, cN2b, cM0) - Signed by Sherrod Sherrod, MD on 11/03/2023 Method of lymph node assessment: Clinical Final staging pending to rule out metastatic disease  CHIEF COMPLAINT: Here to discuss management of lung cancer  HISTORY OF PRESENT ILLNESS::Andrea Mullen is a 58 y.o. female who presented with  small cell right lung cancer in 2007. She was treated with chemotherapy and radiation therapy at [facility].  According to my review of outside records, she was initially diagnosed with stage T4 N3 M0 disease.  She received 45 Gray at 1.5 [employer] per fraction given twice daily with concurrent cisplatin and etoposide to her chest.  According to outside records the right lung and mediastinum were targeted.  She reports received prophylactic cranial radiation to 76 Gray to the whole brain completed on April 2008 according to outside records.  Radiation oncologist was Alm Blumenthal, MD  She had also established care with Dr. Shelah on October of 2023 due to an abnormal chest CT on 11/09/21 showing a solid 10 mm right upper lobe pulmonary nodule, a 5 mm ground-glass right upper lobe nodule, and subpleural linear opacities in the right upper lobe consistent with scarring. Follow up imaging was recommended at that time which was never performed.    Pertaining to her present recurrence, she presented to the ED on 10/14/23 with SOB and a productive cough. A CTA of the chest was performed in this setting which revealed a right upper lobe mass measuring  approximately 5.7 x 5.3 cm (at the site of the previously demonstrated small RUL pulmonary nodule on her prior chest CT from October 2023), mildly prominent sub-centimeter bilateral supraclavicular lymph nodes, and bulky mediastinal lymphadenopathy measuring up to 3.1 x 2.5 cm, concerning for primary lung malignancy. Further work-up was recommended, and she was discharged with instructions to pursue OP follow-up.    She then returned to the ED on 10/18/23 with c/o persistent SOB and chest pain. Initial ED work-up consisting of a head and soft tissue neck CT revealed head and neck thrombosis/dural sinus thrombosis and an acute CVA. Neurology and vascular surgery were consulted and she was admitted and started on IV heparin . A CTA of the chest was also performed upon admission which redemonstrated the 4.4 cm x 4.8 cm x 4.6 cm thick walled cavitary lesion within the posterior aspect of the RUL, and mild right hilar lymphadenopathy. CTA findings also included: occlusive thrombus within the brachiocephalic vein, nonocclusive thrombus within the superior vena cava and azygous vein, and extensive, nonocclusive thrombus within the right axillary vein and right subclavian vein.    Pulmonary medicine was consulted and she was cleared to undergo and bronchoscopy with EBUS on 10/22/23 under the care of Dr. Jude. FNA of a 4R lymph node was collected at that time and showed non small cell carcinoma, consistent with squamous cell carcinoma. RUL lavage and brushings were also submitted for cytology; both showed no evidence of malignancy.    Other imaging performed during her hospitalization included:  -- CT venogram of the head on  09/09 which showed: stable thrombosis of the right IJ, right sigmoid sinus, and lateral half of the right transverse dural venous sinus. No other sites of intracranial venous thrombosis were identified, or evidence of intracranial mass effect, hemorrhage, or acute cerebral edema. 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   The remainder of her hospital course consisted of heparin  (later transitioned to Lovenox  at discharge) and abx. She as also started on Eliquis . She was discharged home in stable condition on 10/27/23.     After being discharged, she established care with Dr. Sherrod on 11/03/23. Dr. Sherrod has recommended concurrent chemoradiation with weekly carboplatin and paclitaxel which we will discuss in detail today. She is expected to undergo her first cycle of chemotherapy on 11/15/23.    She was seen in the [facility] ED yesterday for evaluation of left leg swelling and left sided neck pain after several falls on 09/23 resulting in head trauma. Imaging performed in the ED included a CT of the head which showed no evidence of acute trauma. Imaging findings did however show increased sinus membrane thickening of the frontal and maxillary sinuses and patchy opacification of ethmoid air cells and right mastoid effusion. A CT of the cervical spine was then performed which also showed no evidence of acute injury or trauma.    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 CT findings otherwise showed stability of the 7 mm ground-glass nodule in the RUL, and stability of the prominent right mid hilar lymph node.    She was started on IV abx for possible PNA related to her cavitary lesion. Admission was recommended and she was transferred to [facility] earlier today for further management. .  The patient was seen today while she was lying on a gurney in her hospital room.  Her daughter was asleep during the consultation.    The patient acknowledges that she has have not had much rest over the past 24 hours.  She reports that she has been trying to intentionally lose weight.  She reports that she feels cold but does not have any knowledge of recent fevers.  PREVIOUS RADIATION THERAPY: Yes as above//presented with  small cell right lung cancer in 2007. She was treated with chemotherapy and radiation therapy at [facility].  According to my review of outside records, she was initially diagnosed with stage T4 N3 M0 disease.  She received 45 Gray at 1.5 [place] per fraction given twice daily with concurrent  cisplatin and etoposide to her chest.  According to outside records the right lung and mediastinum were targeted.  She reports received prophylactic cranial radiation to 78 Gray to the whole brain completed on April 2008 according to outside records.  Radiation oncologist was Alm Blumenthal, MD  PAST MEDICAL HISTORY:  has a past medical history of Anxiety, Asthma, Blind, Cancer (HCC), Hyperlipemia, Hypertension, and Stroke [facility]).    PAST SURGICAL HISTORY: Past Surgical History:  Procedure Laterality Date   CARDIAC CATHETERIZATION N/A  11/01/2014   Procedure: Left Heart Cath and Coronary Angiography;  Surgeon: Victory LELON Sharps, MD;  Location: [facility] INVASIVE CV LAB;  Service: Cardiovascular;  Laterality: N/A;   CESAREAN SECTION     x3   HERNIA REPAIR     VIDEO BRONCHOSCOPY WITH ENDOBRONCHIAL ULTRASOUND Right 10/22/2023   Procedure: BRONCHOSCOPY, WITH EBUS;  Surgeon: Jude Harden GAILS, MD;  Location: [facility] ENDOSCOPY;  Service: Cardiopulmonary;  Laterality: Right;    FAMILY HISTORY: family history includes Clotting disorder in her mother.  SOCIAL HISTORY:  reports that she has been smoking cigarettes. She has never used smokeless tobacco. She reports that she does not currently use alcohol. She reports that she does not currently use drugs after having used the following drugs: Marijuana and Cocaine .  ALLERGIES: Bicillin c-r, Penicillins, Nsaids, and Prednisone   MEDICATIONS:  Current Facility-Administered Medications  Medication Dose Route Frequency Provider Last Rate Last Admin   acetaminophen  (TYLENOL ) tablet 650 mg  650 mg Oral Q6H PRN Waddell Rake, MD       Or   acetaminophen  (TYLENOL ) suppository 650 mg  650 mg Rectal Q6H PRN Waddell Rake, MD       ceFEPIme (MAXIPIME) 2 g in sodium chloride  0.9 % 100 mL IVPB  2 g Intravenous Q8H Seabron Ronal HERO, RPH 200 mL/hr at 11/12/23 1542 2 g at 11/12/23 1542   ondansetron  (ZOFRAN ) tablet 4 mg  4 mg Oral Q6H PRN Waddell Rake, MD       Or   ondansetron  (ZOFRAN ) injection 4 mg  4 mg Intravenous Q6H PRN Waddell Rake, MD       vancomycin (VANCOREADY) IVPB 1500 mg/300 mL  1,500 mg Intravenous Q24H Seabron Ronal HERO, RPH 150 mL/hr at 11/12/23 1645 1,500 mg at 11/12/23 1645    REVIEW OF SYSTEMS:  Notable for that above.   PHYSICAL EXAM:  height is 5' 5 (1.651 m) and weight is 175 lb (79.4 kg). Her oral temperature is 98.4 F (36.9 C). Her blood pressure is 102/87 and her pulse is 110 (abnormal). Her respiration is 18 and oxygen saturation is 100%.   General: Alert and oriented, in no acute  distress with a blunted affect HEENT: Head is normocephalic. Extraocular movements are intact.  Dry mucous membranes Heart: Regular in rate and rhythm with no murmurs, rubs, or gallops. Chest: Clear to auscultation bilaterally, with no rhonchi, wheezes, or rales. Abdomen: Soft, nontender, nondistended, with no rigidity or guarding. Extremities: No cyanosis or edema in lower extremities.   Well-nourished Musculoskeletal:   Neurologic: Cranial nerves II through XII are grossly intact.   Psychiatric: Blunted affect   ECOG = 3  0 - Asymptomatic (Fully active, able to carry on all predisease activities without restriction)  1 - Symptomatic but completely ambulatory (Restricted in physically strenuous activity but ambulatory and able to carry out work of a light or sedentary nature. For example, light housework, office work)  2 - Symptomatic, <50% in bed during the day (Ambulatory and capable of all self  care but unable to carry out any work activities. Up and about more than 50% of waking hours)  3 - Symptomatic, >50% in bed, but not bedbound (Capable of only limited self-care, confined to bed or chair 50% or more of waking hours)  4 - Bedbound (Completely disabled. Cannot carry on any self-care. Totally confined to bed or chair)  5 - Death   Raylene MM, Creech RH, Tormey DC, et al. [phone]). Toxicity and response criteria of the [facility]. Am. DOROTHA Bridges. Oncol. 5 (6): 649-55   LABORATORY DATA:  Lab Results  Component Value Date   WBC 4.6 11/11/2023   HGB 10.7 (L) 11/11/2023   HCT 33.3 (L) 11/11/2023   MCV 88.1 11/11/2023   PLT 207 11/11/2023   CMP     Component Value Date/Time   NA 136 11/11/2023 1834   K 4.2 11/11/2023 1834   CL 99 11/11/2023 1834   CO2 26 11/11/2023 1834   GLUCOSE 127 (H) 11/11/2023 1834   BUN 8 11/11/2023 1834   CREATININE 0.73 11/11/2023 1834   CREATININE 0.68 11/03/2023 0922   CREATININE 0.72 04/03/2021 0000   CALCIUM  10.1  11/11/2023 1834   PROT 8.0 11/11/2023 1834   ALBUMIN 3.6 11/11/2023 1834   AST 28 11/11/2023 1834   AST 14 (L) 11/03/2023 0922   ALT 16 11/11/2023 1834   ALT 15 11/03/2023 0922   ALKPHOS 64 11/11/2023 1834   BILITOT 0.6 11/11/2023 1834   BILITOT 0.5 11/03/2023 0922   EGFR 99 04/03/2021 0000   GFRNONAA >60 11/11/2023 1834   GFRNONAA >60 11/03/2023 0922         RADIOGRAPHY: CT Angio Chest PE W and/or Wo Contrast Result Date: 11/11/2023 CLINICAL DATA:  Clemens 2 days ago with head and neck trauma and midline neck tenderness. Reports taking blood thinners. Increased leg weakness and swelling. Pulmonary embolism suspected. Prior history of lung cancer. EXAM: CT HEAD WITHOUT CONTRAST CT CERVICAL SPINE WITHOUT CONTRAST CT CHEST, ABDOMEN AND PELVIS WITH CONTRAST TECHNIQUE: Contiguous axial images were obtained from the base of the skull through the vertex without intravenous contrast. Multidetector CT imaging of the cervical spine was performed without intravenous contrast. Multiplanar CT image reconstructions were also generated. Multidetector CT imaging of the chest was performed following the standard protocol during bolus administration of intravenous contrast. Multiplanar CT image reconstructions and MIPS were obtained to evaluate the vascular anatomy. RADIATION DOSE REDUCTION: This exam was performed according to the departmental dose-optimization program which includes automated exposure control, adjustment of the mA and/or kV according to patient size and/or use of iterative reconstruction technique. CONTRAST:  OMNIPAQUE  IOHEXOL  350 MG/ML SOLN COMPARISON:  MRI brain 10/19/2023, CTA head and neck and reconstructions 11/08/2021, soft tissue neck CT 10/18/2023, CTA chest 10/18/2023 and 10/14/2023. FINDINGS: CT HEAD FINDINGS Brain: There is moderately advanced atrophy, small-vessel disease and atrophic ventriculomegaly. Multiple tiny chronic bilateral cerebellar lacunar infarcts are again noted,  small chronic lacunar infarcts in the right thalamus and right pons, and a few bilateral chronic basal ganglia lacunae are again noted. No cortical based acute infarct, hemorrhage, mass or mass effect are seen. No new midline shift. Basal cisterns are clear. Vascular: Age advanced calcific plaque both siphons. No hyperdense vessel is seen. 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 If this is still present, it is not visible with noncontrast CT. Skull: Negative for fractures or focal lesions. Sinuses/Orbits: Negative  orbits. Increased patchy opacification ethmoid air cells and increased membrane thickening both maxillary and frontal sinuses. The sphenoid sinuses clear. Again noted is a diffuse right mastoid effusion and partial opacification in the right middle ear. There is increased moderate patchy left mastoid effusion with clear left middle ear cavity. Other: None. CT CERVICAL FINDINGS Alignment: There is a straightened slightly reversed cervical lordosis. There is no listhesis. No widening of the anterior atlantodental joint. No C1-2 lateral mass offset. Skull base and vertebrae: No acute fracture is evident. No primary bone lesion or focal pathologic process. Soft tissues and spinal canal: No prevertebral fluid or swelling. No visible canal hematoma. There is trace calcification at the carotid bifurcations. There is no thyroid  or laryngeal mass. Unremarkable parotid and submandibular glands. Disc levels: The C5-6 disc is degenerative with small bidirectional osteophytes. The other discs are normal in heights. No significant soft tissue or bony encroachment on the thecal sac and cord is seen. There are slight facet spurring changes at most levels, uncinate spurring at C5-6. Foraminal stenosis is only seen at C5-6 where it is mild. The other foramina appear to be patent. Small anterior endplate spurring is seen at  C3-4, C4-5 and C6-7 but no bulky osteophytes or bridging bone. Other:  None. CT CHEST FINDINGS Cardiovascular: There are extensive mediastinal, anterior chest wall and paraspinal venous collateral vessels filling with dense contrast. 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 The SVC was better demonstrated on last study due to the right-sided injection on that exam. Extensive but nonocclusive thrombus was previously noted in the right axillosubclavian vein and could still be present, but this is not evaluated due to the left-sided injection today. The distal SVC and cavoatrial junction, and the cardiac chambers opacify well. Nonocclusive thrombus in the azygous vein is also again noted. Partially occlusive thrombus also appears to still be present within an engorged right internal mammary vein on series 8 images 40-54. The heart is slightly enlarged, with left ventricular and septal hypertrophy and patchy calcification of the mitral ring. There is no enlargement of the right chambers. Small pericardial effusion is again noted. There are normal caliber pulmonary arteries, adequately opacified to the segmental level with no evidence of arterial embolism, but the subsegmental arteries largely unopacified and not evaluated. There are moderate patchy calcific plaques in the aorta and proximal great vessels skull with heavy calcifications and thickening of the aortic valve leaflets without aortic aneurysm, dissection or stenosis. There are scattered three-vessel coronary artery calcific plaques. The pulmonary veins are normal in caliber. Mediastinum/Nodes: Stable prominent right mid hilar lymph node of 1.4 cm short axis on 8:54. Right paratracheal area is difficult to assess due to the engorged thrombosed SVC but no new abnormality is seen in the area. I do not see further adenopathy.  Lungs/Pleura: Interval new small layering right pleural effusion. No left pleural effusion. Right upper lobe irregularly thick-walled cavitary lesion posteriorly again noted measuring 5.1 x 4.1 cm, not notably changed in size but with increased fluid now almost completely filling the cavity. Infectious complication not excluded. There are scarring changes, mild volume loss in the right middle lobe. Stable 7 mm ground-glass right upper lobe nodule on 10:66. Remaining lungs are generally clear. Upper abdomen: No acute abnormality. Musculoskeletal: No regional bone metastases. Review of the MIP images confirms the above findings. IMPRESSION: 1. No acute intracranial CT findings or depressed  skull fractures. 2. Atrophy, small-vessel disease and multiple chronic lacunar infarcts. 3. Sinus disease with increased membrane thickening in the frontal and maxillary sinuses, increased patchy opacification of the ethmoid air cells. 4. Persistent right mastoid effusion and partial opacification of the right middle ear. Interval increased left mastoid effusion. 5. No evidence of cervical fractures or malalignment. 6. C5-6 degenerative disc disease and spondylosis with mild foraminal stenosis. 7. No evidence of pulmonary arterial dilatation or embolus through the segmental level. Subsegmental arteries are largely unopacified and not evaluated. 8. 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 9. Not well evaluated, there may still be nonocclusive thrombus in the right axillosubclavian vein and azygous vein, and there is partially occlusive thrombus in the right internal mammary vein. 10. Interval new small layering right pleural effusion. 11. 5.1 x 4.1 cm right upper lobe cavitary mass is now almost fully filled with fluid. Infectious complication not excluded. 12. Stable 7 mm  ground-glass right upper lobe nodule. 13. Stable prominent right mid hilar lymph node. 14. Aortic and coronary artery atherosclerosis. Heavily calcified thickened aortic valve leaflets. Aortic Atherosclerosis (ICD10-I70.0). Electronically Signed   By: Francis Quam M.D.   On: 11/11/2023 20:59   CT Head Wo Contrast Result Date: 11/11/2023 CLINICAL DATA:  Clemens 2 days ago with head and neck trauma and midline neck tenderness. Reports taking blood thinners. Increased leg weakness and swelling. Pulmonary embolism suspected. Prior history of lung cancer. EXAM: CT HEAD WITHOUT CONTRAST CT CERVICAL SPINE WITHOUT CONTRAST CT CHEST, ABDOMEN AND PELVIS WITH CONTRAST TECHNIQUE: Contiguous axial images were obtained from the base of the skull through the vertex without intravenous contrast. Multidetector CT imaging of the cervical spine was performed without intravenous contrast. Multiplanar CT image reconstructions were also generated. Multidetector CT imaging of the chest was performed following the standard protocol during bolus administration of intravenous contrast. Multiplanar CT image reconstructions and MIPS were obtained to evaluate the vascular anatomy. RADIATION DOSE REDUCTION: This exam was performed according to the departmental dose-optimization program which includes automated exposure control, adjustment of the mA and/or kV according to patient size and/or use of iterative reconstruction technique. CONTRAST:  OMNIPAQUE  IOHEXOL  350 MG/ML SOLN COMPARISON:  MRI brain 10/19/2023, CTA head and neck and reconstructions 11/08/2021, soft tissue neck CT 10/18/2023, CTA chest 10/18/2023 and 10/14/2023. FINDINGS: CT HEAD FINDINGS Brain: There is moderately advanced atrophy, small-vessel disease and atrophic ventriculomegaly. Multiple tiny chronic bilateral cerebellar lacunar infarcts are again noted, small chronic lacunar infarcts in the right thalamus and right pons, and a few bilateral chronic basal ganglia  lacunae are again noted. No cortical based acute infarct, hemorrhage, mass or mass effect are seen. No new midline shift. Basal cisterns are clear. Vascular: Age advanced calcific plaque both siphons. No hyperdense vessel is seen. 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 If this is still present, it is not visible with noncontrast CT. Skull: Negative for fractures or focal lesions. Sinuses/Orbits: Negative orbits. Increased patchy opacification ethmoid air cells and increased membrane thickening both maxillary and frontal sinuses. The sphenoid sinuses clear. Again noted is a diffuse right mastoid effusion and partial opacification in the right middle ear. There is increased moderate patchy left mastoid effusion with clear left middle ear cavity. Other: None. CT CERVICAL FINDINGS Alignment: There is a straightened slightly reversed cervical lordosis.  There is no listhesis. No widening of the anterior atlantodental joint. No C1-2 lateral mass offset. Skull base and vertebrae: No acute fracture is evident. No primary bone lesion or focal pathologic process. Soft tissues and spinal canal: No prevertebral fluid or swelling. No visible canal hematoma. There is trace calcification at the carotid bifurcations. There is no thyroid  or laryngeal mass. Unremarkable parotid and submandibular glands. Disc levels: The C5-6 disc is degenerative with small bidirectional osteophytes. The other discs are normal in heights. No significant soft tissue or bony encroachment on the thecal sac and cord is seen. There are slight facet spurring changes at most levels, uncinate spurring at C5-6. Foraminal stenosis is only seen at C5-6 where it is mild. The other foramina appear to be patent. Small anterior endplate spurring is seen at C3-4, C4-5 and C6-7 but no bulky osteophytes or bridging bone. Other:  None. CT CHEST FINDINGS Cardiovascular:  There are extensive mediastinal, anterior chest wall and paraspinal venous collateral vessels filling with dense contrast. 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 The SVC was better demonstrated on last study due to the right-sided injection on that exam. Extensive but nonocclusive thrombus was previously noted in the right axillosubclavian vein and could still be present, but this is not evaluated due to the left-sided injection today. The distal SVC and cavoatrial junction, and the cardiac chambers opacify well. Nonocclusive thrombus in the azygous vein is also again noted. Partially occlusive thrombus also appears to still be present within an engorged right internal mammary vein on series 8 images 40-54. The heart is slightly enlarged, with left ventricular and septal hypertrophy and patchy calcification of the mitral ring. There is no enlargement of the right chambers. Small pericardial effusion is again noted. There are normal caliber pulmonary arteries, adequately opacified to the segmental level with no evidence of arterial embolism, but the subsegmental arteries largely unopacified and not evaluated. There are moderate patchy calcific plaques in the aorta and proximal great vessels skull with heavy calcifications and thickening of the aortic valve leaflets without aortic aneurysm, dissection or stenosis. There are scattered three-vessel coronary artery calcific plaques. The pulmonary veins are normal in caliber. Mediastinum/Nodes: Stable prominent right mid hilar lymph node of 1.4 cm short axis on 8:54. Right paratracheal area is difficult to assess due to the engorged thrombosed SVC but no new abnormality is seen in the area. I do not see further adenopathy. Lungs/Pleura: Interval new small layering right pleural effusion. No left pleural effusion. Right upper lobe irregularly  thick-walled cavitary lesion posteriorly again noted measuring 5.1 x 4.1 cm, not notably changed in size but with increased fluid now almost completely filling the cavity. Infectious complication not excluded. There are scarring changes, mild volume loss in the right middle lobe. Stable 7 mm ground-glass right upper lobe nodule on 10:66. Remaining lungs are generally clear. Upper abdomen: No acute abnormality. Musculoskeletal: No regional bone metastases. Review of the MIP images confirms the above findings. IMPRESSION: 1. No acute intracranial CT findings or depressed skull fractures. 2. Atrophy, small-vessel disease and multiple chronic lacunar infarcts. 3. Sinus disease with increased membrane thickening in the frontal and maxillary sinuses, increased patchy opacification of the ethmoid air cells. 4. Persistent right mastoid effusion and partial opacification of the right middle ear. Interval increased left mastoid effusion. 5. No evidence of cervical fractures or malalignment. 6. C5-6 degenerative disc disease and  spondylosis with mild foraminal stenosis. 7. No evidence of pulmonary arterial dilatation or embolus through the segmental level. Subsegmental arteries are largely unopacified and not evaluated. 8. 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 9. Not well evaluated, there may still be nonocclusive thrombus in the right axillosubclavian vein and azygous vein, and there is partially occlusive thrombus in the right internal mammary vein. 10. Interval new small layering right pleural effusion. 11. 5.1 x 4.1 cm right upper lobe cavitary mass is now almost fully filled with fluid. Infectious complication not excluded. 12. Stable 7 mm ground-glass right upper lobe nodule. 13. Stable prominent right mid hilar lymph node. 14. Aortic and coronary artery  atherosclerosis. Heavily calcified thickened aortic valve leaflets. Aortic Atherosclerosis (ICD10-I70.0). Electronically Signed   By: Francis Quam M.D.   On: 11/11/2023 20:59   CT Cervical Spine Wo Contrast Result Date: 11/11/2023 CLINICAL DATA:  Clemens 2 days ago with head and neck trauma and midline neck tenderness. Reports taking blood thinners. Increased leg weakness and swelling. Pulmonary embolism suspected. Prior history of lung cancer. EXAM: CT HEAD WITHOUT CONTRAST CT CERVICAL SPINE WITHOUT CONTRAST CT CHEST, ABDOMEN AND PELVIS WITH CONTRAST TECHNIQUE: Contiguous axial images were obtained from the base of the skull through the vertex without intravenous contrast. Multidetector CT imaging of the cervical spine was performed without intravenous contrast. Multiplanar CT image reconstructions were also generated. Multidetector CT imaging of the chest was performed following the standard protocol during bolus administration of intravenous contrast. Multiplanar CT image reconstructions and MIPS were obtained to evaluate the vascular anatomy. RADIATION DOSE REDUCTION: This exam was performed according to the departmental dose-optimization program which includes automated exposure control, adjustment of the mA and/or kV according to patient size and/or use of iterative reconstruction technique. CONTRAST:  OMNIPAQUE  IOHEXOL  350 MG/ML SOLN COMPARISON:  MRI brain 10/19/2023, CTA head and neck and reconstructions 11/08/2021, soft tissue neck CT 10/18/2023, CTA chest 10/18/2023 and 10/14/2023. FINDINGS: CT HEAD FINDINGS Brain: There is moderately advanced atrophy, small-vessel disease and atrophic ventriculomegaly. Multiple tiny chronic bilateral cerebellar lacunar infarcts are again noted, small chronic lacunar infarcts in the right thalamus and right pons, and a few bilateral chronic basal ganglia lacunae are again noted. No cortical based acute infarct, hemorrhage, mass or mass effect are seen. No new  midline shift. Basal cisterns are clear. Vascular: Age advanced calcific plaque both siphons. No hyperdense vessel is seen. 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 If this is still present, it is not visible with noncontrast CT. Skull: Negative for fractures or focal lesions. Sinuses/Orbits: Negative orbits. Increased patchy opacification ethmoid air cells and increased membrane thickening both maxillary and frontal sinuses. The sphenoid sinuses clear. Again noted is a diffuse right mastoid effusion and partial opacification in the right middle ear. There is increased moderate patchy left mastoid effusion with clear left middle ear cavity. Other: None. CT CERVICAL FINDINGS Alignment: There is a straightened slightly reversed cervical lordosis. There is no listhesis. No widening of the anterior atlantodental joint. No C1-2 lateral mass offset. Skull base and vertebrae: No acute fracture is evident. No primary bone lesion or focal pathologic process. Soft tissues and spinal canal: No prevertebral fluid or swelling. No visible canal hematoma. There is trace calcification at the carotid bifurcations. There is no thyroid  or laryngeal mass. Unremarkable  parotid and submandibular glands. Disc levels: The C5-6 disc is degenerative with small bidirectional osteophytes. The other discs are normal in heights. No significant soft tissue or bony encroachment on the thecal sac and cord is seen. There are slight facet spurring changes at most levels, uncinate spurring at C5-6. Foraminal stenosis is only seen at C5-6 where it is mild. The other foramina appear to be patent. Small anterior endplate spurring is seen at C3-4, C4-5 and C6-7 but no bulky osteophytes or bridging bone. Other:  None. CT CHEST FINDINGS Cardiovascular: There are extensive mediastinal, anterior chest wall and paraspinal venous collateral vessels filling with  dense contrast. 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 The SVC was better demonstrated on last study due to the right-sided injection on that exam. Extensive but nonocclusive thrombus was previously noted in the right axillosubclavian vein and could still be present, but this is not evaluated due to the left-sided injection today. The distal SVC and cavoatrial junction, and the cardiac chambers opacify well. Nonocclusive thrombus in the azygous vein is also again noted. Partially occlusive thrombus also appears to still be present within an engorged right internal mammary vein on series 8 images 40-54. The heart is slightly enlarged, with left ventricular and septal hypertrophy and patchy calcification of the mitral ring. There is no enlargement of the right chambers. Small pericardial effusion is again noted. There are normal caliber pulmonary arteries, adequately opacified to the segmental level with no evidence of arterial embolism, but the subsegmental arteries largely unopacified and not evaluated. There are moderate patchy calcific plaques in the aorta and proximal great vessels skull with heavy calcifications and thickening of the aortic valve leaflets without aortic aneurysm, dissection or stenosis. There are scattered three-vessel coronary artery calcific plaques. The pulmonary veins are normal in caliber. Mediastinum/Nodes: Stable prominent right mid hilar lymph node of 1.4 cm short axis on 8:54. Right paratracheal area is difficult to assess due to the engorged thrombosed SVC but no new abnormality is seen in the area. I do not see further adenopathy. Lungs/Pleura: Interval new small layering right pleural effusion. No left pleural effusion. Right upper lobe irregularly thick-walled cavitary lesion posteriorly again noted measuring 5.1 x 4.1 cm, not notably changed in size  but with increased fluid now almost completely filling the cavity. Infectious complication not excluded. There are scarring changes, mild volume loss in the right middle lobe. Stable 7 mm ground-glass right upper lobe nodule on 10:66. Remaining lungs are generally clear. Upper abdomen: No acute abnormality. Musculoskeletal: No regional bone metastases. Review of the MIP images confirms the above findings. IMPRESSION: 1. No acute intracranial CT findings or depressed skull fractures. 2. Atrophy, small-vessel disease and multiple chronic lacunar infarcts. 3. Sinus disease with increased membrane thickening in the frontal and maxillary sinuses, increased patchy opacification of the ethmoid air cells. 4. Persistent right mastoid effusion and partial opacification of the right middle ear. Interval increased left mastoid effusion. 5. No evidence of cervical fractures or malalignment. 6. C5-6 degenerative disc disease and spondylosis with mild foraminal stenosis. 7. No evidence of pulmonary arterial dilatation or embolus through the segmental level. Subsegmental arteries are largely unopacified and not evaluated. 8. 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 9. Not well evaluated, there may still be nonocclusive thrombus  in the right axillosubclavian vein and azygous vein, and there is partially occlusive thrombus in the right internal mammary vein. 10. Interval new small layering right pleural effusion. 11. 5.1 x 4.1 cm right upper lobe cavitary mass is now almost fully filled with fluid. Infectious complication not excluded. 12. Stable 7 mm ground-glass right upper lobe nodule. 13. Stable prominent right mid hilar lymph node. 14. Aortic and coronary artery atherosclerosis. Heavily calcified thickened aortic valve leaflets. Aortic Atherosclerosis (ICD10-I70.0).  Electronically Signed   By: Francis Quam M.D.   On: 11/11/2023 20:59   US  Venous Img Lower Right (DVT Study) Result Date: 11/11/2023 EXAM: ULTRASOUND DUPLEX OF THE RIGHT LOWER EXTREMITY VEINS TECHNIQUE: Duplex ultrasound using B-mode/gray scaled imaging and Doppler spectral analysis and color flow was obtained of the deep venous structures of the right lower extremity. COMPARISON: None. CLINICAL HISTORY: Swelling, lung cancer. FINDINGS: The common femoral vein, femoral vein, popliteal vein, and posterior tibial vein of the right lower extremity demonstrate normal compressibility with normal color flow and spectral analysis. IMPRESSION: 1. No evidence of DVT. Electronically signed by: Ozell Daring MD 11/11/2023 07:10 PM EDT RP Workstation: [id number]   CT ABDOMEN PELVIS W CONTRAST Result Date: 10/25/2023 EXAM: CT ABDOMEN AND PELVIS WITH CONTRAST 10/25/2023 06:32:57 PM TECHNIQUE: CT of the abdomen and pelvis was performed with the administration of intravenous contrast. Multiplanar reformatted images are provided for review. Automated exposure control, iterative reconstruction, and/or weight-based adjustment of the mA/kV was utilized to reduce the radiation dose to as low as reasonably achievable. COMPARISON: None available. CLINICAL HISTORY: Metastatic disease evaluation. History of lung cancer. Tracking code: Bo. FINDINGS: LOWER CHEST: Visualized lung bases are clear. LIVER: The liver is unremarkable. GALLBLADDER AND BILE DUCTS: Gallbladder is unremarkable. No biliary ductal dilatation. SPLEEN: No acute abnormality. PANCREAS: No acute abnormality. ADRENAL GLANDS: No acute abnormality. KIDNEYS, URETERS AND BLADDER: No stones in the kidneys or ureters. No hydronephrosis. No perinephric or periureteral stranding. Urinary bladder is unremarkable. GI AND BOWEL: Moderate descending and sigmoid colonic diverticulosis. The stomach, small bowel, and large bowel are otherwise unremarkable. Normal appendix. PERITONEUM  AND RETROPERITONEUM: No ascites. No free air. VASCULATURE: Mild aortoiliac atherosclerotic calcification. Extensive calcification of the aortic valve leaflets. Suspected bicuspid aortic valve. Calcification of the mitral valve leaflets. Global cardiac size within normal limits. Echocardiography may be helpful to better assess the aortic valve morphology and the degree of valvular dysfunction. LYMPH NODES: No lymphadenopathy. REPRODUCTIVE ORGANS: Partially calcified uterine fibroids result in a lobulated morphology of the uterus. No adnexal masses are seen. BONES AND SOFT TISSUES: Osseous structures are age appropriate. No acute bone abnormality. No lytic or blastic bone lesion. 4.2 cm rounded subdermal lesion is seen within the left gluteal region which may represent a sebaceous cyst, but is not well characterized on this examination. Umbilical/ventral hernia repair with mesh has been performed. No recurrent abdominal wall hernia. RAF SCORE: Aortic atherosclerosis (ICD10-I70.0) IMPRESSION: 1. No evidence of metastatic disease within the abdomen and pelvis. 2. Moderate descending and sigmoid colonic diverticulosis without evidence of diverticulitis. 3. Mild aortoiliac atherosclerotic calcification. Electronically signed by: Dorethia Molt MD 10/25/2023 09:28 PM EDT RP Workstation: [id number]   DG CHEST PORT 1 VIEW Result Date: 10/24/2023 CLINICAL DATA:  Shortness of breath. EXAM: PORTABLE CHEST 1 VIEW COMPARISON:  Chest radiograph dated 10/22/2023. FINDINGS: Cavitary mass in the right upper lobe measures 5.2 cm in diameter. No new consolidation. There is no pleural effusion pneumothorax. The cardiac silhouette is within normal limits. No acute osseous pathology. IMPRESSION:  Cavitary mass in the right upper lobe. Electronically Signed   By: Vanetta Chou M.D.   On: 10/24/2023 13:50   DG CHEST PORT 1 VIEW Result Date: 10/22/2023 CLINICAL DATA:  Status post bronchoscopy EXAM: PORTABLE CHEST 1 VIEW COMPARISON:   October 18, 2023 FINDINGS: Redemonstration of cavitary right upper lobe lung mass. No new consolidations. No pleural effusions. No pneumothorax. Unchanged cardiomediastinal silhouette with known right paratracheal lymphadenopathy. No acute osseous findings. IMPRESSION: Unchanged right upper lobe cavitary lung mass and right paratracheal lymphadenopathy. No acute findings. Electronically Signed   By: Michaeline Blanch M.D.   On: 10/22/2023 11:58   ECHOCARDIOGRAM COMPLETE Result Date: 10/20/2023    ECHOCARDIOGRAM REPORT   Patient Name:   KIJANA ESTOCK Sabia Date of Exam: 10/20/2023 Medical Rec #:  [id number]        Height:       65.0 in Accession #:    [id number]       Weight:       216.3 lb Date of Birth:  December 23, 1965        BSA:          2.045 m Patient Age:    57 years         BP:           104/73 mmHg Patient Gender: F                HR:           105 bpm. Exam Location:  Inpatient Procedure: 2D Echo, Cardiac Doppler and Color Doppler (Both Spectral and Color            Flow Doppler were utilized during procedure). Indications:    Pulmonary Embolus  History:        Patient has prior history of Echocardiogram examinations, most                 recent 11/09/2021. CAD, Stroke, Signs/Symptoms:Chest Pain; Risk                 Factors:Hypertension and Current Smoker.  Sonographer:    Juliene Rucks Referring Phys: [phone] RONDELL A SMITH IMPRESSIONS  1. Left ventricular ejection fraction, by estimation, is 60 to 65%. The left ventricle has normal function. The left ventricle has no regional wall motion abnormalities. Left ventricular diastolic parameters are consistent with Grade I diastolic dysfunction (impaired relaxation). Elevated left ventricular end-diastolic pressure.  2. Right ventricular systolic function was not well visualized. The right ventricular size is not well visualized.  3. The mitral valve is degenerative. Trivial mitral valve regurgitation. Moderate to severe mitral annular calcification.  4. The aortic valve  was not adequately interrogated to determine stenosis - there is calcification of the valve and aortic root. Image 67 demonstrates an aortic valve gradient of at least 30 mmHg peak and 14 mmHg, mean.. The aortic valve has an indeterminant number of cusps. There is severe calcifcation of the aortic valve. Aortic valve regurgitation is trivial. Moderate to severe aortic valve stenosis.  5. The inferior vena cava is normal in size with greater than 50% respiratory variability, suggesting right atrial pressure of 3 mmHg. Comparison(s): Changes from prior study are noted. 11/09/2021: LVEF 60-65%, severe AS - possibly bicuspid aortic valve. Conclusion(s)/Recommendation(s): Consider further evaluation of the aortic valve with TEE. FINDINGS  Left Ventricle: Left ventricular ejection fraction, by estimation, is 60 to 65%. The left ventricle has normal function. The left ventricle has no regional wall motion abnormalities. The left ventricular internal  cavity size was normal in size. There is  no left ventricular hypertrophy. Left ventricular diastolic parameters are consistent with Grade I diastolic dysfunction (impaired relaxation). Elevated left ventricular end-diastolic pressure. Right Ventricle: The right ventricular size is not well visualized. Right vetricular wall thickness was not well visualized. Right ventricular systolic function was not well visualized. Left Atrium: Left atrial size was normal in size. Right Atrium: Right atrial size was normal in size. Pericardium: There is no evidence of pericardial effusion. Mitral Valve: The mitral valve is degenerative in appearance. Moderate to severe mitral annular calcification. Trivial mitral valve regurgitation. Tricuspid Valve: The tricuspid valve is not well visualized. Tricuspid valve regurgitation is not demonstrated. Aortic Valve: The aortic valve was not adequately interrogated to determine stenosis - there is calcification of the valve and aortic root. Image 67  demonstrates an aortic valve gradient of at least 30 mmHg peak and 14 mmHg, mean. The aortic valve has an  indeterminant number of cusps. There is severe calcifcation of the aortic valve. There is moderate aortic valve annular calcification. Aortic valve regurgitation is trivial. Moderate to severe aortic stenosis is present. Pulmonic Valve: The pulmonic valve was not well visualized. Pulmonic valve regurgitation is not visualized. Aorta: The aortic root and ascending aorta are structurally normal, with no evidence of dilitation. Venous: The inferior vena cava is normal in size with greater than 50% respiratory variability, suggesting right atrial pressure of 3 mmHg. IAS/Shunts: The interatrial septum was not well visualized.  LEFT VENTRICLE PLAX 2D LVIDd:         5.20 cm   Diastology LVIDs:         4.60 cm   LV e' medial:    4.24 cm/s LV PW:         0.80 cm   LV E/e' medial:  30.2 LV IVS:        0.90 cm   LV e' lateral:   7.07 cm/s LVOT diam:     1.70 cm   LV E/e' lateral: 18.1 LVOT Area:     2.27 cm  RIGHT VENTRICLE RV S prime:     5.34 cm/s LEFT ATRIUM           Index LA diam:      3.00 cm 1.47 cm/m LA Vol (A2C): 48.1 ml 23.52 ml/m LA Vol (A4C): 40.9 ml 20.00 ml/m   AORTA Ao Root diam: 2.60 cm MITRAL VALVE MV Area (PHT): 4.39 cm     SHUNTS MV Decel Time: 173 msec     Systemic Diam: 1.70 cm MV E velocity: 128.00 cm/s MV A velocity: 162.00 cm/s MV E/A ratio:  0.79 Vinie Maxcy MD Electronically signed by Vinie Maxcy MD Signature Date/Time: 10/20/2023/6:31:01 PM    Final    VAS US  LOWER EXTREMITY VENOUS (DVT) Result Date: 10/20/2023  Lower Venous DVT Study Patient Name:  JANIS SOL  Date of Exam:   10/20/2023 Medical Rec #: [id number]         Accession #:    [id number] Date of Birth: 15-Apr-1965         Patient Gender: F Patient Age:   4 years Exam Location:  [facility] Procedure:      VAS US  LOWER EXTREMITY VENOUS (DVT) Referring Phys: MAXIMINO SHARPS  --------------------------------------------------------------------------------  Indications: Pulmonary embolism.  Performing Technologist: Elmarie Lindau, RVT  Examination Guidelines: A complete evaluation includes B-mode imaging, spectral Doppler, color Doppler, and power Doppler as needed of all accessible portions of each vessel. Bilateral testing  is considered an integral part of a complete examination. Limited examinations for reoccurring indications may be performed as noted. The reflux portion of the exam is performed with the patient in reverse Trendelenburg.  +---------+---------------+---------+-----------+----------+--------------+ RIGHT    CompressibilityPhasicitySpontaneityPropertiesThrombus Aging +---------+---------------+---------+-----------+----------+--------------+ CFV      Full           Yes      Yes                                 +---------+---------------+---------+-----------+----------+--------------+ SFJ      Full                                                        +---------+---------------+---------+-----------+----------+--------------+ FV Prox  Full                                                        +---------+---------------+---------+-----------+----------+--------------+ FV Mid   Full                                                        +---------+---------------+---------+-----------+----------+--------------+ FV DistalFull                                                        +---------+---------------+---------+-----------+----------+--------------+ PFV      Full                                                        +---------+---------------+---------+-----------+----------+--------------+ POP      Partial                                      Chronic        +---------+---------------+---------+-----------+----------+--------------+ PTV      Full                                                         +---------+---------------+---------+-----------+----------+--------------+ PERO     Full                                                        +---------+---------------+---------+-----------+----------+--------------+   +---------+---------------+---------+-----------+----------+--------------+ LEFT     CompressibilityPhasicitySpontaneityPropertiesThrombus Aging +---------+---------------+---------+-----------+----------+--------------+ CFV  Full           Yes      Yes                                 +---------+---------------+---------+-----------+----------+--------------+ SFJ      Full                                                        +---------+---------------+---------+-----------+----------+--------------+ FV Prox  Full                                                        +---------+---------------+---------+-----------+----------+--------------+ FV Mid   Full                                                        +---------+---------------+---------+-----------+----------+--------------+ FV DistalFull                                                        +---------+---------------+---------+-----------+----------+--------------+ PFV      Full                                                        +---------+---------------+---------+-----------+----------+--------------+ POP      Partial                                      Chronic        +---------+---------------+---------+-----------+----------+--------------+ PTV      Full                                                        +---------+---------------+---------+-----------+----------+--------------+ PERO     Full                                                        +---------+---------------+---------+-----------+----------+--------------+     Summary: RIGHT: - Findings consistent 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  - No  cystic structure found in the popliteal fossa.  LEFT: - Findings consistent with chronic deep vein thrombosis involving the left popliteal vein.  - No cystic structure found in the popliteal fossa.  *See table(s) above for measurements and observations. Electronically signed by  Lonni Gaskins MD on 10/20/2023 at 2:53:16 PM.    Final    MR Brain W and Wo Contrast Result Date: 10/19/2023 CLINICAL DATA:  Initial evaluation for dural venous sinus thrombosis, acute neuro deficit. EXAM: MRI HEAD WITHOUT AND WITH CONTRAST TECHNIQUE: Multiplanar, multiecho pulse sequences of the brain and surrounding structures were obtained without and with intravenous contrast. CONTRAST:  10mL GADAVIST  GADOBUTROL  1 MMOL/ML IV SOLN COMPARISON:  Comparison made with CT from earlier the same day as well as multiple previous exams. FINDINGS: Brain: Diffuse prominence of the CSF containing spaces compatible generalized cerebral atrophy. Patchy and confluent T2/FLAIR hyperintensity involving the periventricular deep white matter both cerebral hemispheres, consistent with chronic microvascular ischemic disease, moderately advanced in nature. Mild patchy involvement of the pons and cerebellum noted. Few remote lacunar infarcts noted about the bilateral basal ganglia. Remote right pontine infarct with a few scattered small remote bilateral cerebellar infarcts. 6 mm acute ischemic nonhemorrhagic infarcts seen involving the right splenium (series 5, image 75). No other evidence for acute or subacute ischemia. Gray-white matter differentiation otherwise maintained. No acute intracranial hemorrhage. Multiple scattered chronic micro hemorrhages noted, likely hypertensive in nature. No mass lesion, midline shift, or mass effect. Mild ventricular prominence related to global parenchymal volume loss of hydrocephalus. No extra-axial fluid collection. Pituitary gland and suprasellar region within normal limits. There is an apparent 6 mm focus of  enhancement involving the right pons (series 17, image 14). This is at the location of an underlying right pontine infarct as well as a few prominent chronic micro hemorrhages. Given this, finding is suspected to reflect enhancement related to a small of all vein subacute infarct or possibly artifact related to the adjacent microhemorrhage. No other abnormal enhancement elsewhere within the brain. Vascular: Major intracranial arterial vascular flow voids are maintained. Previously identified dural venous sinus thrombosis involving the right transverse and sigmoid sinuses with extension into the right jugular bulb and visualized proximal right internal jugular vein, corresponding with findings on recent exams. Overall, appearance is relatively similar. Skull and upper cervical spine: Craniocervical junction within normal limits. Heterogeneous and mildly decreased T1 signal intensity within the visualized bone marrow, nonspecific, but most commonly related to anemia, smoking or obesity. No scalp soft tissue abnormality. Sinuses/Orbits: Globes orbital soft tissues within normal limits. Mild chronic mucosal thickening noted about the paranasal sinuses. Moderate to large right mastoid and middle ear effusion. Trace left mastoid effusion noted. Other: None. IMPRESSION: 1. 6 mm acute ischemic nonhemorrhagic infarct involving the right splenium. 2. 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 Overall, appearance is relatively stable from prior. 3. 6 mm focus of enhancement involving the right pons, favored to reflect enhancement related to a small subacute infarct or possibly artifact related to the adjacent microhemorrhage. Short interval follow-up MRI in 3 months suggested to ensure resolution and/or stability. 4. Underlying age-related cerebral atrophy with moderately advanced chronic  microvascular ischemic disease, with a few scattered remote lacunar infarcts involving the bilateral basal ganglia, right pons, and bilateral cerebellar hemispheres. 5. Multiple scattered chronic micro hemorrhages, likely hypertensive in nature. Electronically Signed   By: Morene Hoard M.D.   On: 10/19/2023 23:23   CT VENOGRAM HEAD Result Date: 10/19/2023 CLINICAL DATA:  58 year old female status post fall. Pain. Prior stroke. Left side weakness and numbness. History of lung cancer. Head and neck CT CT with contrast yesterday demonstrating central  venous thrombosis, right IJ thrombosis involving the right transverse and sigmoid sinus also. EXAM: CT VENOGRAM HEAD TECHNIQUE: Venographic phase images of the brain were obtained following the administration of intravenous contrast. Multiplanar reformats and maximum intensity projections were generated. RADIATION DOSE REDUCTION: This exam was performed according to the departmental dose-optimization program which includes automated exposure control, adjustment of the mA and/or kV according to patient size and/or use of iterative reconstruction technique. CONTRAST:  75mL OMNIPAQUE  IOHEXOL  300 MG/ML  SOLN COMPARISON:  Head and neck CT yesterday.  Brain MRI 11/09/2021. FINDINGS: CT HEAD Post-contrast only. Stable cerebral volume. Chronic lacunar infarcts in the bilateral brain. No evidence of intracranial mass effect, acute ventriculomegaly, acute intracranial hemorrhage, acute cerebral edema. CTV HEAD Venous sinuses: Maintained enhancement and patency of the superior sagittal sinus, torcula, straight sinus, inferior sagittal sinus, internal cerebral veins. Maintained enhancement and patency of the left transverse, left sigmoid venous sinus, and visible left IJ bulb. Cavernous sinus enhancement appears symmetric and within normal limits also. Nonenhancing, thrombosed right IJ bulb, right sigmoid venous sinus, and distal half of the right transverse sinus (beginning  on series 2, image 25. The medial right transverse sinus remains enhancing. The appearance is unchanged from head and neck CT yesterday. Anatomic variants: None significant. Other findings: Calcified atherosclerosis at the skull base. Review of the MIP images confirms the above findings IMPRESSION: 1. Stable Thrombosis of the right IJ, right sigmoid sinus, and lateral half of the right transverse dural venous sinus since the CT Head and Neck yesterday. 2. No other intracranial venous thrombosis identified. No intracranial mass effect, hemorrhage, or acute cerebral edema identified. Electronically Signed   By: VEAR Hurst M.D.   On: 10/19/2023 08:20   CT Soft Tissue Neck W Contrast Result Date: 10/18/2023 CLINICAL DATA:  Initial evaluation for acute facial swelling. EXAM: CT NECK WITH CONTRAST TECHNIQUE: Multidetector CT imaging of the neck was performed using the standard protocol following the bolus administration of intravenous contrast. RADIATION DOSE REDUCTION: This exam was performed according to the departmental dose-optimization program which includes automated exposure control, adjustment of the mA and/or kV according to patient size and/or use of iterative reconstruction technique. CONTRAST:  OMNIPAQUE  IOHEXOL  350 MG/ML SOLN COMPARISON:  None Available. FINDINGS: Pharynx and larynx: Oral cavity within normal limits. Oropharynx and nasopharynx within normal limits. No retropharyngeal collection or swelling. Negative epiglottis. Hypopharynx and supraglottic larynx within normal limits. Negative glottis. Subglottic airway clear. Salivary glands: Salivary glands including the parotid and submandibular glands are within normal limits. Thyroid : Normal. Lymph nodes: No enlarged or pathologic lymph nodes within the neck. Vascular: Occlusive thrombus seen involving the brachiocephalic vein, extending into the visualized SVC. The vessels are expanded with hazy inflammatory stranding, consistent with acute  thrombus. Clot extends to partially involve the right greater than left subclavian veins as well as the right axillary vein. Cephalad extension with subocclusive thrombus extending throughout the right internal jugular vein, with extension into the cranial vault to involve the right transverse and sigmoid sinuses. Left IJ remains patent. Multiple prominent venous collaterals noted within the right neck and visualized right chest wall. Normal arterial enhancement seen throughout the neck. Aortic atherosclerosis noted. Limited intracranial: No acute finding. Visualized orbits: No acute finding. Mastoids and visualized paranasal sinuses: Mild mucoperiosteal thickening about the ethmoidal air cells and maxillary sinuses. Right mastoid and middle ear effusion. Left mastoid air cells are clear. Skeleton: No worrisome osseous lesions. Upper chest: Approximate 5 cm thick walled cavitary mass at the posterior right  upper lobe, partially visualize, better evaluated on concomitant CT of the chest. Other: None. IMPRESSION: 1. Acute occlusive and subocclusive thrombus involving the brachiocephalic vein, extending into the visualized SVC. Clot extends to partially involve the right greater than left subclavian veins as well as the right axillary vein. Cephalad extension with subocclusive thrombus extending throughout the right internal jugular vein, with extension into the cranial vault to involve the right transverse and sigmoid sinuses. 2. Approximate 5 cm thick walled cavitary mass at the posterior right upper lobe, partially visualized, better evaluated on concomitant CT of the chest. 3. Right mastoid and middle ear effusion, of uncertain significance. Correlation with physical exam recommended. 4.  Aortic Atherosclerosis (ICD10-I70.0). Electronically Signed   By: Morene Hoard M.D.   On: 10/18/2023 19:21   CT Head W or Wo Contrast Result Date: 10/18/2023 CLINICAL DATA:  Initial evaluation for acute headache, history  of malignancy. EXAM: CT HEAD WITHOUT AND WITH CONTRAST TECHNIQUE: Contiguous axial images were obtained from the base of the skull through the vertex without and with intravenous contrast. RADIATION DOSE REDUCTION: This exam was performed according to the departmental dose-optimization program which includes automated exposure control, adjustment of the mA and/or kV according to patient size and/or use of iterative reconstruction technique. CONTRAST:  OMNIPAQUE  IOHEXOL  350 MG/ML SOLN COMPARISON:  Prior CT from 09/20/2023 FINDINGS: Brain: Generalized age-related cerebral atrophy. Patchy and confluent hypodensity involving the supratentorial cerebral white matter, consistent with chronic small vessel ischemic disease, moderate in nature. Multiple scattered remote lacunar infarcts noted about the bilateral basal ganglia right thalamus, stable. No acute intracranial hemorrhage. No acute large vessel territory infarct. No mass lesion or midline shift. No hydrocephalus or extra-axial fluid collection. Vascular: No abnormal hyperdense vessel seen prior to contrast administration. Calcified atherosclerosis present at the skull base. Following contrast administration, filling defect within the right transverse and sigmoid sinuses extending into the right jugular bulb and visualized proximal right internal jugular vein, consistent with dural venous sinus thrombosis. This appears to extend inferiorly throughout the right IJ within the neck. Skull: Scalp soft tissues demonstrate no acute finding. Calvarium intact. Sinuses/Orbits: Globes orbital soft tissues within normal limits. Mild mucoperiosteal thickening present about the paranasal sinuses. Moderate right mastoid and middle ear effusion. Left mastoid air cells are clear. Other: None. IMPRESSION: 1. Filling defect within the right transverse and sigmoid sinuses extending into the right jugular bulb and visualized proximal right internal jugular vein, consistent with  dural venous sinus thrombosis. This extends inferiorly throughout the right IJ within the neck. 2. No other acute intracranial abnormality. 3. Moderate right mastoid and middle ear effusion, of uncertain significance. Correlation with physical exam recommended. 4. Underlying age-related cerebral atrophy with moderate chronic small vessel ischemic disease, with multiple remote lacunar infarcts about the bilateral basal ganglia and right thalamus. Electronically Signed   By: Morene Hoard M.D.   On: 10/18/2023 19:11   CT Angio Chest PE W and/or Wo Contrast Addendum Date: 10/18/2023 ADDENDUM REPORT: 10/18/2023 18:55 ADDENDUM: Results were discussed with Dr. Dreama at 6:41 p.m. [place] on October 18, 2023. Electronically Signed   By: Suzen Dials M.D.   On: 10/18/2023 18:55   Result Date: 10/18/2023 CLINICAL DATA:  History of lung cancer presenting with chest tightness. EXAM: CT ANGIOGRAPHY CHEST WITH CONTRAST TECHNIQUE: Multidetector CT imaging of the chest was performed using the standard protocol during bolus administration of intravenous contrast. Multiplanar CT image reconstructions and MIPs were obtained to evaluate the vascular anatomy. RADIATION DOSE REDUCTION: This  exam was performed according to the departmental dose-optimization program which includes automated exposure control, adjustment of the mA and/or kV according to patient size and/or use of iterative reconstruction technique. CONTRAST:  OMNIPAQUE  IOHEXOL  350 MG/ML SOLN COMPARISON:  October 14, 2023 FINDINGS: Cardiovascular: There is marked severity calcification of the aortic arch without evidence of aortic aneurysm or dissection. The segmental and subsegmental pulmonary arteries are limited in evaluation secondary to suboptimal opacification with intravenous contrast. As result, pulmonary embolism within the segmental branches of the right lower lobe cannot be excluded (axial CT images 132 through 142, CT series 6).  Occlusive thrombus is noted within the brachiocephalic vein. This is present on the prior study. Nonocclusive thrombus is seen within the superior vena cava (axial CT images 39 through 45, CT series 5). Nonocclusive thrombus is also noted within the azygous vein (axial CT images 44 through 78, CT series 5). A partially thrombosed venous structure is seen within the anteromedial aspect of the upper right lung (axial CT image 44 through 53, CT series 5). Extensive, nonocclusive thrombus is noted within the right axillary vein and right subclavian vein (axial CT images 5 through 52, CT series 5) Numerous thin, tortuous, enhancing venous structures are seen within the anterior and posterior aspects of the chest wall on the right. Normal heart size with mild to moderate severity coronary artery calcification and marked severity calcification of the mitral annulus. No pericardial effusion. Mediastinum/Nodes: There is mild right hilar lymphadenopathy. Thyroid  gland, trachea, and esophagus demonstrate no significant findings. Lungs/Pleura: A 4.4 cm x 4.8 cm x 4.6 cm thick walled cavitary lesion is again seen within the posterior aspect of the right upper lobe. Mild anteromedial right middle lobe linear atelectasis is noted. No pleural effusion or pneumothorax identified. Upper Abdomen: There is a small hiatal hernia. Diffuse fatty infiltration of the liver parenchyma is noted. Noninflamed diverticula are seen throughout the visualized portions of the large bowel. Musculoskeletal: No chest wall abnormality. No acute or significant osseous findings. Review of the MIP images confirms the above findings. IMPRESSION: 1. Limited evaluation of the segmental and subsegmental pulmonary arteries secondary to suboptimal opacification with intravenous contrast. As result, pulmonary embolism within the segmental branches of the right lower lobe cannot be excluded. 2. Occlusive thrombus within the brachiocephalic vein, nonocclusive  thrombus within the superior vena cava and azygous vein, and extensive, nonocclusive thrombus within the right axillary vein and right subclavian vein. 3. 4.4 cm x 4.8 cm x 4.6 cm thick walled cavitary lesion within the posterior aspect of the right upper lobe which is seen on the prior study and may be consistent with the patient's history of lung cancer. 4. Mild right hilar lymphadenopathy. 5. Small hiatal hernia. 6. Hepatic steatosis. 7. Colonic diverticulosis. 8. Aortic atherosclerosis. Electronically Signed: By: Suzen Dials M.D. On: 10/18/2023 18:52   DG Chest 2 View Result Date: 10/18/2023 CLINICAL DATA:  Chest pain EXAM: CHEST - 2 VIEW COMPARISON:  Chest radiograph October 14, 2023, CT angio chest October 14, 2023 FINDINGS: Thick-walled cavitary lesion with air-fluid level in right upper lobe, measuring approximately 5 cm. No new lesion or consolidation identified. Cardiomediastinal silhouette is enlarged. Fullness of right paratracheal correlating to mediastinal lymphadenopathy. No pleural effusion or pneumothorax. Multilevel degenerative changes of the spine. Nodular opacity superimposing lower thoracic spine on lateral view correlates to osteophytes better assessed on recent CT angio chest. IMPRESSION: Thick-walled cavitary lesion of right upper lobe. Right lower paratracheal mediastinal lymphadenopathy. Electronically Signed   By: Megan  Terri M.D.   On: 10/18/2023 14:34   CT Angio Chest PE W and/or Wo Contrast Result Date: 10/14/2023 CLINICAL DATA:  Increased shortness of breath, history of lung cancer in 2008 normal pulmonary embolism suspected, positive D-dimer EXAM: CT ANGIOGRAPHY CHEST WITH CONTRAST TECHNIQUE: Multidetector CT imaging of the chest was performed using the standard protocol during bolus administration of intravenous contrast. Multiplanar CT image reconstructions and MIPs were obtained to evaluate the vascular anatomy. RADIATION DOSE REDUCTION: This exam was performed  according to the departmental dose-optimization program which includes automated exposure control, adjustment of the mA and/or kV according to patient size and/or use of iterative reconstruction technique. CONTRAST:  75mL OMNIPAQUE  IOHEXOL  350 MG/ML SOLN COMPARISON:  Same day chest radiograph, November 09, 2021 CT FINDINGS: Cardiovascular: Satisfactory opacification of the pulmonary arteries to the segmental level. No evidence of pulmonary embolism. No pericardial effusion. Nonspecific narrowing of left brachiocephalic vein and upper SVC due to mediastinal lymphadenopathy with venous reflux into the left chest wall collaterals. Mediastinum/Nodes: Bulky right paratracheal lymphadenopathy, measuring up to 3.1 x 2.5 cm there are also some mildly prominent, subcentimeter bilateral supraclavicular lymph nodes. Lungs/Pleura: Thick-walled, cavitary right upper lobe mass measuring approximately 5.7 x 5.3 cm. There was a small pulmonary nodule at this location on prior CT performed November 09, 2021. No pleural effusions. No pneumothorax. Upper Abdomen: No acute findings. Musculoskeletal: No acute osseous findings. Review of the MIP images confirms the above findings. IMPRESSION: 1. Cavitary right upper lobe mass and bulky mediastinal lymphadenopathy, concerning for primary lung malignancy. Tissue biopsy may be helpful to confirm diagnosis and exclude possible mycobacterial infection. 2. Nonspecific narrowing of upper SVC and brachiocephalic veins due to mediastinal lymphadenopathy. 3. Nonspecific, mildly prominent subcentimeter bilateral supraclavicular lymph nodes, felt to be reactive related to the central venous stenosis, but difficult to exclude metastatic disease. Electronically Signed   By: Michaeline Blanch M.D.   On: 10/14/2023 12:49   DG Chest 2 View Result Date: 10/14/2023 CLINICAL DATA:  SOB EXAM: CHEST - 2 VIEW COMPARISON:  January 29, 2022 FINDINGS: Thick-walled cavity in the right upper lobe, measuring 5.5 cm. No  lobar consolidation, pleural effusion, or pneumothorax. Fullness of the right suprahilar region. No cardiomegaly. Aortic atherosclerosis. No acute fracture or destructive lesions. Multilevel thoracic osteophytosis. IMPRESSION: 1. Thick-walled cavity in the right upper lobe, measuring 5.5 cm. This may represent either TB or neoplasm. Appropriate isolation precautions. 2. Fullness of the right suprahilar region is also present, worrisome for lymphadenopathy. A follow-up chest CT with IV contrast recommended for further characterization. Critical Value/emergent results were called by telephone at the time of interpretation on 10/14/2023 at 8:36 am to provider ANDREW TEE , who verbally acknowledged these results. Electronically Signed   By: Rogelia Myers M.D.   On: 10/14/2023 08:37      IMPRESSION/PLAN: Today I was able to become acquainted with the patient shortly after she was admitted to the hospital.   I explained to her that it is important that she is stabilized medically, to the best of her team's ability, before we consider starting chemoradiation to address the disease in her chest.  Unfortunately it looks as though her staging PET scan will need to be delayed given her recent admission.  This piece of data would be helpful to strategize with her treatment.  She is tentatively scheduled for radiation planning next week.  I will get in touch with Dr. Sherrod to coordinate care as appropriate.  The patient understands that treatment planning may be  delayed depending on how she does during her hospital course.  All questions were answered to her satisfaction.  I look forward to participating in her care.  A consent session was not pursued today and can be conducted before her CT simulation.  On date of service, in total, I spent 60 minutes on this encounter. Patient was seen in person.   __________________________________________   Lauraine Golden, MD

## 2023-11-12 NOTE — Progress Notes (Signed)
   11/12/23 1438  TOC Brief Assessment  Insurance and Status Reviewed  Patient has primary care physician Yes ([facility])  Home environment has been reviewed Home  Prior level of function: Independent with developing difficulty ambulating  Prior/Current Home Services No current home services  Social Drivers of Health Review SDOH reviewed no interventions necessary  Readmission risk has been reviewed Yes  Transition of care needs no transition of care needs at this time

## 2023-11-12 NOTE — Assessment & Plan Note (Addendum)
 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  Have added on oncology, Dr. Sherrod

## 2023-11-12 NOTE — Assessment & Plan Note (Addendum)
 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 Infection on CTA chest and moderate to severe aortic stenosis.  -she has right upper lobe cavitary mass s/p bronch back in 10/2023 that is now filled with fluid and infectious complication can not be excluded. She has no leukocytosis, fever, but met SIRS criteria on admission with tachycardia and tachypnea. Will continue abx, trend PCT. She has no complaints of shortness of breath, chest pain or worsening cough.  -PT/OT to see/eval  -moderate to severe aortic stenosis, cardiology f/u vs. Consult.  -consider SSRI/f/u with PCP  -SW as may need rehab

## 2023-11-12 NOTE — Assessment & Plan Note (Addendum)
 Hypotensive Not on any ant HTN medication  Check orthostatics

## 2023-11-12 NOTE — Assessment & Plan Note (Signed)
Continue crestor 40mg daily. 

## 2023-11-12 NOTE — Assessment & Plan Note (Signed)
 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 

## 2023-11-12 NOTE — Assessment & Plan Note (Signed)
 During recent hospitalization in 10/2023 found to have acute thrombus involving SVC, brachiocephalic vein, azygous vein, right axillary vein and right subclavian vein. Also dural sinus thrombosis -repeat imaging continues to show above findings  -continue eliquis 

## 2023-11-12 NOTE — ED Notes (Signed)
 Called [facility] to transport the patient to [facility] rm[facility] [id number]

## 2023-11-13 DIAGNOSIS — I10 Essential (primary) hypertension: Secondary | ICD-10-CM | POA: Diagnosis present

## 2023-11-13 DIAGNOSIS — I82A11 Acute embolism and thrombosis of right axillary vein: Secondary | ICD-10-CM | POA: Diagnosis present

## 2023-11-13 DIAGNOSIS — Z9221 Personal history of antineoplastic chemotherapy: Secondary | ICD-10-CM | POA: Diagnosis not present

## 2023-11-13 DIAGNOSIS — Z880 Allergy status to penicillin: Secondary | ICD-10-CM | POA: Diagnosis not present

## 2023-11-13 DIAGNOSIS — I82210 Acute embolism and thrombosis of superior vena cava: Secondary | ICD-10-CM | POA: Diagnosis present

## 2023-11-13 DIAGNOSIS — R296 Repeated falls: Secondary | ICD-10-CM | POA: Diagnosis present

## 2023-11-13 DIAGNOSIS — R531 Weakness: Secondary | ICD-10-CM | POA: Diagnosis present

## 2023-11-13 DIAGNOSIS — J189 Pneumonia, unspecified organism: Secondary | ICD-10-CM | POA: Diagnosis present

## 2023-11-13 DIAGNOSIS — Z8673 Personal history of transient ischemic attack (TIA), and cerebral infarction without residual deficits: Secondary | ICD-10-CM | POA: Diagnosis not present

## 2023-11-13 DIAGNOSIS — Z888 Allergy status to other drugs, medicaments and biological substances status: Secondary | ICD-10-CM | POA: Diagnosis not present

## 2023-11-13 DIAGNOSIS — H5462 Unqualified visual loss, left eye, normal vision right eye: Secondary | ICD-10-CM | POA: Diagnosis present

## 2023-11-13 DIAGNOSIS — D638 Anemia in other chronic diseases classified elsewhere: Secondary | ICD-10-CM | POA: Diagnosis present

## 2023-11-13 DIAGNOSIS — G08 Intracranial and intraspinal phlebitis and thrombophlebitis: Secondary | ICD-10-CM | POA: Diagnosis not present

## 2023-11-13 DIAGNOSIS — E785 Hyperlipidemia, unspecified: Secondary | ICD-10-CM | POA: Diagnosis present

## 2023-11-13 DIAGNOSIS — Z7901 Long term (current) use of anticoagulants: Secondary | ICD-10-CM | POA: Diagnosis not present

## 2023-11-13 DIAGNOSIS — I82503 Chronic embolism and thrombosis of unspecified deep veins of lower extremity, bilateral: Secondary | ICD-10-CM | POA: Diagnosis present

## 2023-11-13 DIAGNOSIS — I2510 Atherosclerotic heart disease of native coronary artery without angina pectoris: Secondary | ICD-10-CM | POA: Diagnosis present

## 2023-11-13 DIAGNOSIS — I350 Nonrheumatic aortic (valve) stenosis: Secondary | ICD-10-CM | POA: Diagnosis present

## 2023-11-13 DIAGNOSIS — E871 Hypo-osmolality and hyponatremia: Secondary | ICD-10-CM | POA: Diagnosis present

## 2023-11-13 DIAGNOSIS — Z79899 Other long term (current) drug therapy: Secondary | ICD-10-CM | POA: Diagnosis not present

## 2023-11-13 DIAGNOSIS — F1411 Cocaine abuse, in remission: Secondary | ICD-10-CM | POA: Diagnosis present

## 2023-11-13 DIAGNOSIS — J984 Other disorders of lung: Secondary | ICD-10-CM | POA: Diagnosis not present

## 2023-11-13 DIAGNOSIS — C3490 Malignant neoplasm of unspecified part of unspecified bronchus or lung: Secondary | ICD-10-CM | POA: Diagnosis not present

## 2023-11-13 DIAGNOSIS — C3411 Malignant neoplasm of upper lobe, right bronchus or lung: Secondary | ICD-10-CM | POA: Diagnosis present

## 2023-11-13 DIAGNOSIS — D72819 Decreased white blood cell count, unspecified: Secondary | ICD-10-CM | POA: Diagnosis present

## 2023-11-13 DIAGNOSIS — Z886 Allergy status to analgesic agent status: Secondary | ICD-10-CM | POA: Diagnosis not present

## 2023-11-13 DIAGNOSIS — C3491 Malignant neoplasm of unspecified part of right bronchus or lung: Secondary | ICD-10-CM | POA: Diagnosis not present

## 2023-11-13 DIAGNOSIS — F17210 Nicotine dependence, cigarettes, uncomplicated: Secondary | ICD-10-CM | POA: Diagnosis present

## 2023-11-13 DIAGNOSIS — J45909 Unspecified asthma, uncomplicated: Secondary | ICD-10-CM | POA: Diagnosis present

## 2023-11-13 LAB — BASIC METABOLIC PANEL WITH GFR
Anion gap: 11 (ref 5–15)
BUN: 13 mg/dL (ref 6–20)
CO2: 22 mmol/L (ref 22–32)
Calcium: 9.3 mg/dL (ref 8.9–10.3)
Chloride: 103 mmol/L (ref 98–111)
Creatinine, Ser: 0.59 mg/dL (ref 0.44–1.00)
GFR, Estimated: >60 mL/min (ref >60)
Glucose, Bld: 100 mg/dL — ABNORMAL HIGH (ref 70–99)
Potassium: 4.1 mmol/L (ref 3.5–5.1)
Sodium: 136 mmol/L (ref 135–145)

## 2023-11-13 LAB — CBC
HCT: 32.1 % — ABNORMAL LOW (ref 36.0–46.0)
Hemoglobin: 9.7 g/dL — ABNORMAL LOW (ref 12.0–15.0)
MCH: 27.2 pg (ref 26.0–34.0)
MCHC: 30.2 g/dL (ref 30.0–36.0)
MCV: 89.9 fL (ref 80.0–100.0)
Platelets: 190 K/uL (ref 150–400)
RBC: 3.57 MIL/uL — ABNORMAL LOW (ref 3.87–5.11)
RDW: 15.3 % (ref 11.5–15.5)
WBC: 4.1 K/uL (ref 4.0–10.5)
nRBC: 0 % (ref 0.0–0.2)

## 2023-11-13 LAB — TSH: TSH: 3.34 u[IU]/mL (ref 0.350–4.500)

## 2023-11-13 LAB — PROCALCITONIN: Procalcitonin: <0.1 ng/mL

## 2023-11-13 MED ORDER — ROSUVASTATIN CALCIUM 20 MG PO TABS
40.0000 mg | ORAL_TABLET | Freq: Every day | ORAL | Status: DC
  Administered 2023-11-13 – 2023-11-17 (×5): 40 mg via ORAL
  Filled 2023-11-13 (×5): qty 2

## 2023-11-13 MED ORDER — GERHARDT'S BUTT CREAM
TOPICAL_CREAM | Freq: Three times a day (TID) | CUTANEOUS | Status: DC
Start: 2023-11-13 — End: 2023-11-19
  Filled 2023-11-13: qty 60

## 2023-11-13 MED ORDER — IPRATROPIUM-ALBUTEROL 0.5-2.5 (3) MG/3ML IN SOLN
3.0000 mL | Freq: Four times a day (QID) | RESPIRATORY_TRACT | Status: DC
Start: 2023-11-13 — End: 2023-11-13
  Administered 2023-11-13: 3 mL via RESPIRATORY_TRACT

## 2023-11-13 MED ORDER — ORAL CARE MOUTH RINSE: 15.0000 mL | OROMUCOSAL | Status: DC | PRN

## 2023-11-13 NOTE — Evaluation (Signed)
 Occupational Therapy Evaluation Patient Details Name: Andrea Mullen MRN: [id number] DOB: [birth year] Today's Date: 11/13/2023   History of Present Illness   Andrea Mullen is a 58 y.o. female admitted 11/11/23 with generalized weakness and frequent falls. 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 Neurology and vascular surgery were consulted. She was also found to have right sided cavitary lesion and underwent bronchoscopy.  also had lymph node aspiration which was consistent with non small cell carcinoma, consistent with squamous cell carcinoma. She was transitioned to eliquis . 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 She reports that she has not been getting out of bed at home unless forced by her daughters. That she has not showered since being home, and that she was falling frequently. Today, despite being asleep initially she was willing to work with therapy. She performed multiple sit<>stands from EOB (some in rapid succession) along with marching and side stepping. She pulls up on RW despite cues and required mod A. She is max A for LB ADL, mod A for UB ADL bathing/dressing and set up for seated grooming and self-feeding. Pt fatigues quickly and requires assist for grooming tasks like brushing hair. Pt will benefit from skilled OT in the acute setting as well as afterwards at the rehab level of <3 hours daily to maximize safety and independence in ADL and functional transfers.      If plan is discharge home, recommend the following:   A lot of help with bathing/dressing/bathroom;Assistance with cooking/housework;Assist for transportation;Help with stairs or ramp for entrance;Supervision due to cognitive  status;A lot of help with walking and/or transfers;Direct supervision/assist for medications management;Direct supervision/assist for financial management     Functional Status Assessment   Patient has had a recent decline in their functional status and demonstrates the ability to make significant improvements in function in a reasonable and predictable amount of time.     Equipment Recommendations   BSC/3in1;None recommended by OT (Pt has appropriate DME)     Recommendations for Other Services   PT consult;Speech consult     Precautions/Restrictions   Precautions Precautions: Fall Recall of Precautions/Restrictions: Impaired Restrictions Weight Bearing Restrictions Per Provider Order: No     Mobility Bed Mobility Overal bed mobility: Needs Assistance Bed Mobility: Supine to Sit, Sit to Supine     Supine to sit: Max assist (cues use of pad to assist with hips) Sit to supine: Mod assist   General bed mobility comments: pt requires physical assist to initiate movement to  EOB, cues for lateral scooting.  mod A for trunk elevation and use of bed pad to facilitate hips fully to  EOB.    Transfers Overall transfer level: Needs assistance Equipment used: Rolling walker (2 wheels) Transfers: Sit to/from Stand Sit to Stand: Min assist, +2 safety/equipment, +2 physical assistance     Step pivot transfers: Min assist, +2 safety/equipment, +2 physical assistance     General transfer comment: cues for hand placement and foot position.  +2 min assist with anterior-superior wt translation. cues for sequencing standpivot bed >chair, tactile cues and light support L knee to prevent buckling during wt shift to LLE. +2 min assist for balance and safety. incr time needed for all transisitons.  Balance Overall balance assessment: Needs assistance Sitting-balance support: Single extremity supported, No upper extremity supported Sitting balance-Leahy Scale: Fair Sitting  balance - Comments: initial posterior imbalance needing variable CGA to min assist to correct at times   Standing balance support: Reliant on assistive device for balance, Bilateral upper extremity supported, During functional activity Standing balance-Leahy Scale: Poor Standing balance comment: reliant on UEs and external assist for safe static standing                           ADL either performed or assessed with clinical judgement   ADL Overall ADL's : Needs assistance/impaired Eating/Feeding: Bed level;Set up (HOB elevated)   Grooming: Wash/dry face;Set up;Sitting Grooming Details (indicate cue type and reason): with cues for task completion Upper Body Bathing: Moderate assistance   Lower Body Bathing: Maximal assistance   Upper Body Dressing : Moderate assistance   Lower Body Dressing: Maximal assistance Lower Body Dressing Details (indicate cue type and reason): socks Toilet Transfer: Rolling walker (2 wheels);Cueing for safety;Cueing for sequencing;Moderate assistance Toilet Transfer Details (indicate cue type and reason): increased time and frequent cues for RW placement, sequencing,mod A for initial boost from EOB Toileting- Clothing Manipulation and Hygiene: Maximal assistance;Sit to/from stand Toileting - Clothing Manipulation Details (indicate cue type and reason): purewick in place     Functional mobility during ADLs: Cueing for sequencing;Cueing for safety;Rolling walker (2 wheels);Moderate assistance General ADL Comments: decreased cognition, decreased activity tolerance     Vision Baseline Vision/History: 2 Legally blind (L eye) Patient Visual Report: No change from baseline       Perception         Praxis         Pertinent Vitals/Pain Pain Assessment Pain Assessment: No/denies pain Pain Intervention(s): Monitored during session, Repositioned     Extremity/Trunk Assessment Upper Extremity Assessment Upper Extremity Assessment:  Generalized weakness   Lower Extremity Assessment Lower Extremity Assessment: Defer to PT evaluation LLE Deficits / Details: hip flexors 3/5, knee ext 3/5, ankle appears WFL not fully tested   Cervical / Trunk Assessment Cervical / Trunk Assessment: Normal   Communication Communication Communication: Impaired Factors Affecting Communication: Difficulty expressing self (delayed, reduced verbalization)   Cognition Arousal: Alert Behavior During Therapy: Flat affect Cognition: Cognition impaired     Awareness: Intellectual awareness intact, Online awareness impaired Memory impairment (select all impairments): Working memory Attention impairment (select first level of impairment): Sustained attention Executive functioning impairment (select all impairments): Problem solving, Sequencing OT - Cognition Comments: Pt not very verbal during session, remembered therapist from previous admission at [facility]                 Following commands: Impaired Following commands impaired: Only follows one step commands consistently, Follows one step commands with increased time     Cueing  General Comments   Cueing Techniques: Verbal cues;Tactile cues;Gestural cues      Exercises     Shoulder Instructions      Home Living Family/patient expects to be discharged to:: Private residence Living Arrangements: Children Available Help at Discharge: Family;Available 24 hours/day Type of Home: House Home Access: Stairs to enter [employer] of Steps: threshold   Home Layout: One level     Bathroom Shower/Tub: Chief Strategy Officer: Standard     Home Equipment: Rollator (4 wheels);Wheelchair - Forensic psychologist (2 wheels);Shower seat;Cane - single point   Additional Comments: lives with dtr      Prior  Functioning/Environment Prior Level of Function : Needs assist       Physical Assist : Mobility (physical);ADLs (physical) Mobility (physical):  Transfers;Gait;Bed mobility ADLs (physical): Bathing;Dressing;Toileting;IADLs Mobility Comments: not walking since last admssion, dtr reports pt has had falls and care has been more demanding than what she is able to provide. ADLs Comments: Family assists with ADL's as needed.    OT Problem List: Decreased activity tolerance;Impaired balance (sitting and/or standing);Decreased cognition;Decreased safety awareness;Decreased knowledge of use of DME or AE;Cardiopulmonary status limiting activity;Obesity   OT Treatment/Interventions: Self-care/ADL training;DME and/or AE instruction;Therapeutic activities;Cognitive remediation/compensation;Patient/family education;Balance training      OT Goals(Current goals can be found in the care plan section)   Acute Rehab OT Goals Patient Stated Goal: get stronger OT Goal Formulation: With patient/family Time For Goal Achievement: 11/27/23 Potential to Achieve Goals: Fair ADL Goals Pt Will Perform Grooming: with modified independence;sitting Pt Will Perform Upper Body Dressing: with set-up;sitting Pt Will Perform Lower Body Dressing: with min assist;sit to/from stand Pt Will Transfer to Toilet: with contact guard assist;ambulating Pt Will Perform Toileting - Clothing Manipulation and hygiene: with contact guard assist;sit to/from stand Additional ADL Goal #1: Pt wil verbalize at least 3 strategies for energy conservation during ADL with no cues   OT Frequency:  Min 2X/week    Co-evaluation              AM-PAC OT 6 Clicks Daily Activity     Outcome Measure Help from another person eating meals?: A Little Help from another person taking care of personal grooming?: A Little Help from another person toileting, which includes using toliet, bedpan, or urinal?: A Lot Help from another person bathing (including washing, rinsing, drying)?: A Lot Help from another person to put on and taking off regular upper body clothing?: A Little Help from  another person to put on and taking off regular lower body clothing?: A Lot 6 Click Score: 15   End of Session Equipment Utilized During Treatment: Gait belt;Rolling walker (2 wheels) Nurse Communication: Mobility status  Activity Tolerance: Patient tolerated treatment well Patient left: in bed;with call bell/phone within reach;with family/visitor present  OT Visit Diagnosis: Unsteadiness on feet (R26.81);Muscle weakness (generalized) (M62.81);Other symptoms and signs involving the nervous system (R29.898);Other symptoms and signs involving cognitive function                Time: 8389-8366 OT Time Calculation (min): 23 min Charges:  OT General Charges $OT Visit: 1 Visit OT Evaluation $OT Eval Moderate Complexity: 1 Mod OT Treatments $Self Care/Home Management : 8-22 mins  Leita DEL OTR/L Acute Rehabilitation Services Office: [phone]   Leita PARAS [facility] 11/13/2023, 4:53 PM

## 2023-11-13 NOTE — Progress Notes (Addendum)
 PROGRESS NOTE    Andrea Mullen  FMW:[id number] DOB: 12-30-1965 DOA: 11/11/2023 PCP: [facility]   Brief Narrative:  58 y.o. 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  She presented with weakness and falls.  On presentation, hemoglobin was 12.7.  CT head showed no acute intracranial abnormality.  CT cervical spine showed no fractures.  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  She was started on IV antibiotics.  Oncology team has involved IR who is planning for possible initiation of radiation next week.  Assessment & Plan:   Weakness and falls - Possibly in the setting of newly diagnosed lung cancer and SVC thrombosis along with poor oral intake, depression,?  Infection of the chest and moderate to severe aortic stenosis -Continue telemetry monitoring.  PT/OT eval. - Fall precautions  Cavitary mass in the right upper lobe with concern for possible infection - Imaging revealed cavitary lung mass filled with fluid, cannot rule out infection.  I reviewed the images with Dr. Delcie who recommended antibiotic  treatment along with chest physiotherapy and hypertonic saline.  Continue broad-spectrum antibiotics.  New diagnosis of non-small cell lung cancer History of small cell lung cancer status post radiation and chemotherapy at [facility] 2007 - Recently diagnosed with non-small cell lung cancer/squamous cell carcinoma during recent hospitalization in September 2025.  Oncology was planning to start radiation next week.  Oncology team has involved IR who is planning for possible initiation of radiation next week.  PET scan pending as an outpatient.  Head and neck thrombosis -During recent hospitalization in 10/2023 found to have acute thrombus involving SVC, brachiocephalic vein, azygous vein, right axillary vein and right subclavian vein. Also dural sinus thrombosis -repeat imaging continues to show above findings  -continue eliquis    History of present acute ischemic stroke involving the right splenium and right pons: Per neurology, the etiology was likely small vessel disease Hyperlipidemia -Continue high-intensity statin and Eliquis .  Outpatient follow-up with neurology.  PT eval antibiotics  Moderate to severe aortic stenosis - Will need outpatient cardiology evaluation.  CAD Hypertension - Continue medical management.  Currently not on any antihypertensive regimen.  Anemia of chronic disease -From chronic illnesses but hemoglobin stable will monitor intermittently  History of tobacco abuse and cocaine  abuse--apparently, last use of cocaine  was 2 years ago.  Has not smoked in a month.   DVT prophylaxis: Eliquis  Code Status: Full Family Communication: Daughter and granddaughter at bedside Disposition Plan: Status is: Observation The patient will require care spanning > 2 midnights and should be moved to inpatient because: Of severity of illness.    Consultants: IR.  Oncology has been added to care teams.  Procedures: None  Antimicrobials: Cefepime and vancomycin from 11/12/2023  onwards   Subjective: Patient seen and examined  at bedside.  Feels weak and slightly short of breath with exertion.  Has intermittent cough.  Denies any chest pain, fever or vomiting.  Objective: Vitals:   11/13/23 0600 11/13/23 0700 11/13/23 0800 11/13/23 0900  BP:      Pulse:      Resp: 14 12 20 20   Temp:      TempSrc:      SpO2:      Weight:      Height:        Intake/Output Summary (Last 24 hours) at 11/13/2023 1052 Last data filed at 11/12/2023 1734 Gross per 24 hour  Intake 220.73 ml  Output --  Net 220.73 ml   Filed Weights   11/11/23 1720  Weight: 79.4 kg    Examination:  General exam: Appears calm and comfortable.  Chronically ill and deconditioned. Respiratory system: Bilateral decreased breath sounds at bases with scattered wheezing Cardiovascular system: S1 & S2 heard, Rate controlled Gastrointestinal system: Abdomen is nondistended, soft and nontender. Normal bowel sounds heard. Extremities: No cyanosis, clubbing, edema  Central nervous system: Alert and oriented.  Slow to respond.  Poor historian.  No focal neurological deficits. Moving extremities Skin: No rashes, lesions or ulcers Psychiatry: Flat affect.  Not agitated.      Data Reviewed: I have personally reviewed following labs and imaging studies  CBC: Recent Labs  Lab 11/11/23 1834 11/13/23 0710  WBC 4.6 4.1  NEUTROABS 3.5  --   HGB 10.7* 9.7*  HCT 33.3* 32.1*  MCV 88.1 89.9  PLT 207 190   Basic Metabolic Panel: Recent Labs  Lab 11/11/23 1834 11/13/23 0710  NA 136 136  K 4.2 4.1  CL 99 103  CO2 26 22  GLUCOSE 127* 100*  BUN 8 13  CREATININE 0.73 0.59  CALCIUM  10.1 9.3   GFR: Estimated Creatinine Clearance: 80.8 mL/min (by C-G formula based on SCr of 0.59 mg/dL). Liver Function Tests: Recent Labs  Lab 11/11/23 1834  AST 28  ALT 16  ALKPHOS 64  BILITOT 0.6  PROT 8.0  ALBUMIN 3.6   No results for input(s): LIPASE, AMYLASE in the last 168 hours. No results for  input(s): AMMONIA in the last 168 hours. Coagulation Profile: No results for input(s): INR, PROTIME in the last 168 hours. Cardiac Enzymes: No results for input(s): CKTOTAL, CKMB, CKMBINDEX, TROPONINI in the last 168 hours. BNP (last 3 results) Recent Labs    10/18/23 1630  PROBNP 781.0*   HbA1C: No results for input(s): HGBA1C in the last 72 hours. CBG: No results for input(s): GLUCAP in the last 168 hours. Lipid Profile: No results for input(s): CHOL, HDL, LDLCALC, TRIG, CHOLHDL, LDLDIRECT in the last 72 hours. Thyroid  Function Tests: Recent Labs    11/13/23 0710  TSH 3.340   Anemia Panel: No results for input(s): VITAMINB12, FOLATE, FERRITIN, TIBC, IRON, RETICCTPCT in the last 72 hours. Sepsis Labs: Recent Labs  Lab 11/12/23 1408 11/13/23 0710  PROCALCITON <0.10 <0.10    No results found for this or any previous visit (from the past 240 hours).       Radiology Studies: CT Angio Chest PE W and/or Wo Contrast Result Date: 11/11/2023 CLINICAL DATA:  Clemens 2 days ago with head and neck trauma and midline neck tenderness. Reports taking blood thinners. Increased leg weakness and swelling. Pulmonary embolism suspected. Prior history of lung cancer. EXAM: CT HEAD WITHOUT CONTRAST CT CERVICAL SPINE WITHOUT CONTRAST CT CHEST, ABDOMEN AND PELVIS WITH CONTRAST TECHNIQUE: Contiguous axial images were obtained  from the base of the skull through the vertex without intravenous contrast. Multidetector CT imaging of the cervical spine was performed without intravenous contrast. Multiplanar CT image reconstructions were also generated. Multidetector CT imaging of the chest was performed following the standard protocol during bolus administration of intravenous contrast. Multiplanar CT image reconstructions and MIPS were obtained to evaluate the vascular anatomy. RADIATION DOSE REDUCTION: This exam was performed according to the departmental  dose-optimization program which includes automated exposure control, adjustment of the mA and/or kV according to patient size and/or use of iterative reconstruction technique. CONTRAST:  OMNIPAQUE  IOHEXOL  350 MG/ML SOLN COMPARISON:  MRI brain 10/19/2023, CTA head and neck and reconstructions 11/08/2021, soft tissue neck CT 10/18/2023, CTA chest 10/18/2023 and 10/14/2023. FINDINGS: CT HEAD FINDINGS Brain: There is moderately advanced atrophy, small-vessel disease and atrophic ventriculomegaly. Multiple tiny chronic bilateral cerebellar lacunar infarcts are again noted, small chronic lacunar infarcts in the right thalamus and right pons, and a few bilateral chronic basal ganglia lacunae are again noted. No cortical based acute infarct, hemorrhage, mass or mass effect are seen. No new midline shift. Basal cisterns are clear. Vascular: Age advanced calcific plaque both siphons. No hyperdense vessel is seen. 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 If this is still present, it is not visible with noncontrast CT. Skull: Negative for fractures or focal lesions. Sinuses/Orbits: Negative orbits. Increased patchy opacification ethmoid air cells and increased membrane thickening both maxillary and frontal sinuses. The sphenoid sinuses clear. Again noted is a diffuse right mastoid effusion and partial opacification in the right middle ear. There is increased moderate patchy left mastoid effusion with clear left middle ear cavity. Other: None. CT CERVICAL FINDINGS Alignment: There is a straightened slightly reversed cervical lordosis. There is no listhesis. No widening of the anterior atlantodental joint. No C1-2 lateral mass offset. Skull base and vertebrae: No acute fracture is evident. No primary bone lesion or focal pathologic process. Soft tissues and spinal canal: No prevertebral fluid or swelling. No visible canal  hematoma. There is trace calcification at the carotid bifurcations. There is no thyroid  or laryngeal mass. Unremarkable parotid and submandibular glands. Disc levels: The C5-6 disc is degenerative with small bidirectional osteophytes. The other discs are normal in heights. No significant soft tissue or bony encroachment on the thecal sac and cord is seen. There are slight facet spurring changes at most levels, uncinate spurring at C5-6. Foraminal stenosis is only seen at C5-6 where it is mild. The other foramina appear to be patent. Small anterior endplate spurring is seen at C3-4, C4-5 and C6-7 but no bulky osteophytes or bridging bone. Other:  None. CT CHEST FINDINGS Cardiovascular: There are extensive mediastinal, anterior chest wall and paraspinal venous collateral vessels filling with dense contrast. 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 The SVC was better demonstrated on last study due to the right-sided injection on that exam. Extensive but nonocclusive thrombus was previously noted in the right axillosubclavian vein and could still be present, but this is not evaluated due to the left-sided injection today. The distal SVC and cavoatrial junction, and the cardiac chambers opacify well. Nonocclusive thrombus in the azygous vein is also again noted. Partially occlusive thrombus also appears to still be present within an engorged right internal mammary vein on series 8 images 40-54. The  heart is slightly enlarged, with left ventricular and septal hypertrophy and patchy calcification of the mitral ring. There is no enlargement of the right chambers. Small pericardial effusion is again noted. There are normal caliber pulmonary arteries, adequately opacified to the segmental level with no evidence of arterial embolism, but the subsegmental arteries largely unopacified and not  evaluated. There are moderate patchy calcific plaques in the aorta and proximal great vessels skull with heavy calcifications and thickening of the aortic valve leaflets without aortic aneurysm, dissection or stenosis. There are scattered three-vessel coronary artery calcific plaques. The pulmonary veins are normal in caliber. Mediastinum/Nodes: Stable prominent right mid hilar lymph node of 1.4 cm short axis on 8:54. Right paratracheal area is difficult to assess due to the engorged thrombosed SVC but no new abnormality is seen in the area. I do not see further adenopathy. Lungs/Pleura: Interval new small layering right pleural effusion. No left pleural effusion. Right upper lobe irregularly thick-walled cavitary lesion posteriorly again noted measuring 5.1 x 4.1 cm, not notably changed in size but with increased fluid now almost completely filling the cavity. Infectious complication not excluded. There are scarring changes, mild volume loss in the right middle lobe. Stable 7 mm ground-glass right upper lobe nodule on 10:66. Remaining lungs are generally clear. Upper abdomen: No acute abnormality. Musculoskeletal: No regional bone metastases. Review of the MIP images confirms the above findings. IMPRESSION: 1. No acute intracranial CT findings or depressed skull fractures. 2. Atrophy, small-vessel disease and multiple chronic lacunar infarcts. 3. Sinus disease with increased membrane thickening in the frontal and maxillary sinuses, increased patchy opacification of the ethmoid air cells. 4. Persistent right mastoid effusion and partial opacification of the right middle ear. Interval increased left mastoid effusion. 5. No evidence of cervical fractures or malalignment. 6. C5-6 degenerative disc disease and spondylosis with mild foraminal stenosis. 7. No evidence of pulmonary arterial dilatation or embolus through the segmental level. Subsegmental arteries are largely unopacified and not evaluated. 8. 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 9. Not well evaluated, there may still be nonocclusive thrombus in the right axillosubclavian vein and azygous vein, and there is partially occlusive thrombus in the right internal mammary vein. 10. Interval new small layering right pleural effusion. 11. 5.1 x 4.1 cm right upper lobe cavitary mass is now almost fully filled with fluid. Infectious complication not excluded. 12. Stable 7 mm ground-glass right upper lobe nodule. 13. Stable prominent right mid hilar lymph node. 14. Aortic and coronary artery atherosclerosis. Heavily calcified thickened aortic valve leaflets. Aortic Atherosclerosis (ICD10-I70.0). Electronically Signed   By: Francis Quam M.D.   On: 11/11/2023 20:59   CT Head Wo Contrast Result Date: 11/11/2023 CLINICAL DATA:  Clemens 2 days ago with head and neck trauma and midline neck tenderness. Reports taking blood thinners. Increased leg weakness and swelling. Pulmonary embolism suspected. Prior history of lung cancer. EXAM: CT HEAD WITHOUT CONTRAST CT CERVICAL SPINE WITHOUT CONTRAST CT CHEST, ABDOMEN AND PELVIS WITH CONTRAST TECHNIQUE: Contiguous axial images were obtained from the base of the skull through the vertex without intravenous contrast. Multidetector CT imaging of the cervical spine was performed without intravenous contrast. Multiplanar CT image reconstructions were also generated. Multidetector CT imaging of the chest was performed following the standard protocol during bolus administration of intravenous contrast. Multiplanar CT image reconstructions and MIPS were obtained to evaluate the vascular anatomy. RADIATION DOSE  REDUCTION: This exam was performed according to the departmental dose-optimization program which includes automated exposure control, adjustment of the mA and/or kV according to  patient size and/or use of iterative reconstruction technique. CONTRAST:  OMNIPAQUE  IOHEXOL  350 MG/ML SOLN COMPARISON:  MRI brain 10/19/2023, CTA head and neck and reconstructions 11/08/2021, soft tissue neck CT 10/18/2023, CTA chest 10/18/2023 and 10/14/2023. FINDINGS: CT HEAD FINDINGS Brain: There is moderately advanced atrophy, small-vessel disease and atrophic ventriculomegaly. Multiple tiny chronic bilateral cerebellar lacunar infarcts are again noted, small chronic lacunar infarcts in the right thalamus and right pons, and a few bilateral chronic basal ganglia lacunae are again noted. No cortical based acute infarct, hemorrhage, mass or mass effect are seen. No new midline shift. Basal cisterns are clear. Vascular: Age advanced calcific plaque both siphons. No hyperdense vessel is seen. 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 If this is still present, it is not visible with noncontrast CT. Skull: Negative for fractures or focal lesions. Sinuses/Orbits: Negative orbits. Increased patchy opacification ethmoid air cells and increased membrane thickening both maxillary and frontal sinuses. The sphenoid sinuses clear. Again noted is a diffuse right mastoid effusion and partial opacification in the right middle ear. There is increased moderate patchy left mastoid effusion with clear left middle ear cavity. Other: None. CT CERVICAL FINDINGS Alignment: There is a straightened slightly reversed cervical lordosis. There is no listhesis. No widening of the anterior atlantodental joint. No C1-2 lateral mass offset. Skull base and vertebrae: No acute fracture is evident. No primary bone lesion or focal pathologic process. Soft tissues and spinal canal: No prevertebral fluid or swelling. No visible canal hematoma. There is trace calcification at the carotid bifurcations. There is no thyroid  or laryngeal mass.  Unremarkable parotid and submandibular glands. Disc levels: The C5-6 disc is degenerative with small bidirectional osteophytes. The other discs are normal in heights. No significant soft tissue or bony encroachment on the thecal sac and cord is seen. There are slight facet spurring changes at most levels, uncinate spurring at C5-6. Foraminal stenosis is only seen at C5-6 where it is mild. The other foramina appear to be patent. Small anterior endplate spurring is seen at C3-4, C4-5 and C6-7 but no bulky osteophytes or bridging bone. Other:  None. CT CHEST FINDINGS Cardiovascular: There are extensive mediastinal, anterior chest wall and paraspinal venous collateral vessels filling with dense contrast. 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 The SVC was better demonstrated on last study due to the right-sided injection on that exam. Extensive but nonocclusive thrombus was previously noted in the right axillosubclavian vein and could still be present, but this is not evaluated due to the left-sided injection today. The distal SVC and cavoatrial junction, and the cardiac chambers opacify well. Nonocclusive thrombus in the azygous vein is also again noted. Partially occlusive thrombus also appears to still be present within an engorged right internal mammary vein on series 8 images 40-54. The heart is slightly enlarged, with left ventricular and septal hypertrophy and patchy calcification of the mitral ring. There is no enlargement of the right chambers. Small pericardial effusion is again noted. There are normal caliber pulmonary arteries, adequately opacified to the segmental level with no evidence of arterial embolism, but the subsegmental arteries largely unopacified and not evaluated. There are moderate patchy calcific plaques  in the aorta and proximal great vessels skull with heavy  calcifications and thickening of the aortic valve leaflets without aortic aneurysm, dissection or stenosis. There are scattered three-vessel coronary artery calcific plaques. The pulmonary veins are normal in caliber. Mediastinum/Nodes: Stable prominent right mid hilar lymph node of 1.4 cm short axis on 8:54. Right paratracheal area is difficult to assess due to the engorged thrombosed SVC but no new abnormality is seen in the area. I do not see further adenopathy. Lungs/Pleura: Interval new small layering right pleural effusion. No left pleural effusion. Right upper lobe irregularly thick-walled cavitary lesion posteriorly again noted measuring 5.1 x 4.1 cm, not notably changed in size but with increased fluid now almost completely filling the cavity. Infectious complication not excluded. There are scarring changes, mild volume loss in the right middle lobe. Stable 7 mm ground-glass right upper lobe nodule on 10:66. Remaining lungs are generally clear. Upper abdomen: No acute abnormality. Musculoskeletal: No regional bone metastases. Review of the MIP images confirms the above findings. IMPRESSION: 1. No acute intracranial CT findings or depressed skull fractures. 2. Atrophy, small-vessel disease and multiple chronic lacunar infarcts. 3. Sinus disease with increased membrane thickening in the frontal and maxillary sinuses, increased patchy opacification of the ethmoid air cells. 4. Persistent right mastoid effusion and partial opacification of the right middle ear. Interval increased left mastoid effusion. 5. No evidence of cervical fractures or malalignment. 6. C5-6 degenerative disc disease and spondylosis with mild foraminal stenosis. 7. No evidence of pulmonary arterial dilatation or embolus through the segmental level. Subsegmental arteries are largely unopacified and not evaluated. 8. 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 9. Not well evaluated, there may still be nonocclusive thrombus in the right axillosubclavian vein and azygous vein, and there is partially occlusive thrombus in the right internal mammary vein. 10. Interval new small layering right pleural effusion. 11. 5.1 x 4.1 cm right upper lobe cavitary mass is now almost fully filled with fluid. Infectious complication not excluded. 12. Stable 7 mm ground-glass right upper lobe nodule. 13. Stable prominent right mid hilar lymph node. 14. Aortic and coronary artery atherosclerosis. Heavily calcified thickened aortic valve leaflets. Aortic Atherosclerosis (ICD10-I70.0). Electronically Signed   By: Francis Quam M.D.   On: 11/11/2023 20:59   CT Cervical Spine Wo Contrast Result Date: 11/11/2023 CLINICAL DATA:  Clemens 2 days ago with head and neck trauma and midline neck tenderness. Reports taking blood thinners. Increased leg weakness and swelling. Pulmonary embolism suspected. Prior history of lung cancer. EXAM: CT HEAD WITHOUT CONTRAST CT CERVICAL SPINE WITHOUT CONTRAST CT CHEST, ABDOMEN AND PELVIS WITH CONTRAST TECHNIQUE: Contiguous axial images were obtained from the base of the skull through the vertex without intravenous contrast. Multidetector CT imaging of the cervical spine was performed without intravenous contrast. Multiplanar CT image reconstructions were also generated. Multidetector CT imaging of the chest was performed following the standard protocol during bolus administration of intravenous contrast. Multiplanar CT image reconstructions and MIPS were obtained to evaluate the vascular anatomy. RADIATION DOSE REDUCTION: This exam was performed according to the departmental dose-optimization program which includes automated exposure control, adjustment of the mA and/or kV according to patient size and/or use of iterative reconstruction technique. CONTRAST:  OMNIPAQUE  IOHEXOL   350 MG/ML SOLN COMPARISON:  MRI brain 10/19/2023, CTA head and neck and reconstructions 11/08/2021, soft tissue neck CT 10/18/2023, CTA chest 10/18/2023 and 10/14/2023.  FINDINGS: CT HEAD FINDINGS Brain: There is moderately advanced atrophy, small-vessel disease and atrophic ventriculomegaly. Multiple tiny chronic bilateral cerebellar lacunar infarcts are again noted, small chronic lacunar infarcts in the right thalamus and right pons, and a few bilateral chronic basal ganglia lacunae are again noted. No cortical based acute infarct, hemorrhage, mass or mass effect are seen. No new midline shift. Basal cisterns are clear. Vascular: Age advanced calcific plaque both siphons. No hyperdense vessel is seen. 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 If this is still present, it is not visible with noncontrast CT. Skull: Negative for fractures or focal lesions. Sinuses/Orbits: Negative orbits. Increased patchy opacification ethmoid air cells and increased membrane thickening both maxillary and frontal sinuses. The sphenoid sinuses clear. Again noted is a diffuse right mastoid effusion and partial opacification in the right middle ear. There is increased moderate patchy left mastoid effusion with clear left middle ear cavity. Other: None. CT CERVICAL FINDINGS Alignment: There is a straightened slightly reversed cervical lordosis. There is no listhesis. No widening of the anterior atlantodental joint. No C1-2 lateral mass offset. Skull base and vertebrae: No acute fracture is evident. No primary bone lesion or focal pathologic process. Soft tissues and spinal canal: No prevertebral fluid or swelling. No visible canal hematoma. There is trace calcification at the carotid bifurcations. There is no thyroid  or laryngeal mass. Unremarkable parotid and submandibular glands. Disc levels: The C5-6 disc is degenerative with small  bidirectional osteophytes. The other discs are normal in heights. No significant soft tissue or bony encroachment on the thecal sac and cord is seen. There are slight facet spurring changes at most levels, uncinate spurring at C5-6. Foraminal stenosis is only seen at C5-6 where it is mild. The other foramina appear to be patent. Small anterior endplate spurring is seen at C3-4, C4-5 and C6-7 but no bulky osteophytes or bridging bone. Other:  None. CT CHEST FINDINGS Cardiovascular: There are extensive mediastinal, anterior chest wall and paraspinal venous collateral vessels filling with dense contrast. 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 The SVC was better demonstrated on last study due to the right-sided injection on that exam. Extensive but nonocclusive thrombus was previously noted in the right axillosubclavian vein and could still be present, but this is not evaluated due to the left-sided injection today. The distal SVC and cavoatrial junction, and the cardiac chambers opacify well. Nonocclusive thrombus in the azygous vein is also again noted. Partially occlusive thrombus also appears to still be present within an engorged right internal mammary vein on series 8 images 40-54. The heart is slightly enlarged, with left ventricular and septal hypertrophy and patchy calcification of the mitral ring. There is no enlargement of the right chambers. Small pericardial effusion is again noted. There are normal caliber pulmonary arteries, adequately opacified to the segmental level with no evidence of arterial embolism, but the subsegmental arteries largely unopacified and not evaluated. There are moderate patchy calcific plaques in the aorta and proximal great vessels skull with heavy calcifications and thickening of the aortic valve leaflets without aortic aneurysm, dissection or  stenosis. There are scattered three-vessel coronary artery calcific plaques. The pulmonary veins are normal in caliber. Mediastinum/Nodes: Stable prominent right mid hilar lymph node of 1.4 cm short axis on 8:54. Right paratracheal area is difficult to assess due  to the engorged thrombosed SVC but no new abnormality is seen in the area. I do not see further adenopathy. Lungs/Pleura: Interval new small layering right pleural effusion. No left pleural effusion. Right upper lobe irregularly thick-walled cavitary lesion posteriorly again noted measuring 5.1 x 4.1 cm, not notably changed in size but with increased fluid now almost completely filling the cavity. Infectious complication not excluded. There are scarring changes, mild volume loss in the right middle lobe. Stable 7 mm ground-glass right upper lobe nodule on 10:66. Remaining lungs are generally clear. Upper abdomen: No acute abnormality. Musculoskeletal: No regional bone metastases. Review of the MIP images confirms the above findings. IMPRESSION: 1. No acute intracranial CT findings or depressed skull fractures. 2. Atrophy, small-vessel disease and multiple chronic lacunar infarcts. 3. Sinus disease with increased membrane thickening in the frontal and maxillary sinuses, increased patchy opacification of the ethmoid air cells. 4. Persistent right mastoid effusion and partial opacification of the right middle ear. Interval increased left mastoid effusion. 5. No evidence of cervical fractures or malalignment. 6. C5-6 degenerative disc disease and spondylosis with mild foraminal stenosis. 7. No evidence of pulmonary arterial dilatation or embolus through the segmental level. Subsegmental arteries are largely unopacified and not evaluated. 8. 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 9. Not well evaluated,  there may still be nonocclusive thrombus in the right axillosubclavian vein and azygous vein, and there is partially occlusive thrombus in the right internal mammary vein. 10. Interval new small layering right pleural effusion. 11. 5.1 x 4.1 cm right upper lobe cavitary mass is now almost fully filled with fluid. Infectious complication not excluded. 12. Stable 7 mm ground-glass right upper lobe nodule. 13. Stable prominent right mid hilar lymph node. 14. Aortic and coronary artery atherosclerosis. Heavily calcified thickened aortic valve leaflets. Aortic Atherosclerosis (ICD10-I70.0). Electronically Signed   By: Francis Quam M.D.   On: 11/11/2023 20:59   US  Venous Img Lower Right (DVT Study) Result Date: 11/11/2023 EXAM: ULTRASOUND DUPLEX OF THE RIGHT LOWER EXTREMITY VEINS TECHNIQUE: Duplex ultrasound using B-mode/gray scaled imaging and Doppler spectral analysis and color flow was obtained of the deep venous structures of the right lower extremity. COMPARISON: None. CLINICAL HISTORY: Swelling, lung cancer. FINDINGS: The common femoral vein, femoral vein, popliteal vein, and posterior tibial vein of the right lower extremity demonstrate normal compressibility with normal color flow and spectral analysis. IMPRESSION: 1. No evidence of DVT. Electronically signed by: Ozell Daring MD 11/11/2023 07:10 PM EDT RP Workstation: [id number]        Scheduled Meds:  apixaban   5 mg Oral BID   Continuous Infusions:  ceFEPime (MAXIPIME) IV 2 g (11/13/23 0942)   vancomycin 1,500 mg (11/12/23 1645)          Sophie Mao, MD [facility] 11/13/2023, 10:52 AM

## 2023-11-13 NOTE — Evaluation (Signed)
 Physical Therapy Evaluation Patient Details Name: Andrea Mullen MRN: [id number] DOB: 03-Nov-1965 Today's Date: 11/13/2023  History of Present Illness  Andrea Mullen is a 58 y.o. female admitted 11/11/23 with generalized weakness and frequent falls. 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 Neurology and vascular surgery were consulted. She was also found to have right sided cavitary lesion and underwent bronchoscopy.  also had lymph node aspiration which was consistent with non small cell carcinoma, consistent with squamous cell carcinoma. She was transitioned to eliquis . 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  Pt dtr reports pt has not been amb at home since last admission, has had falls, and that her care requires more than they are able to provide currently.  Pt requiring min-mod assist for bed mobility, +2 min assist for STS and SPT transfers at this time. Patient may benefit from continued inpatient follow up therapy, <3 hours/day.   Pt currently with functional limitations due to the deficits listed below (see PT Problem List). Pt will benefit from acute skilled PT to increase their independence and safety with mobility to allow discharge.           If plan is discharge home, recommend the following: Help with stairs or ramp for entrance;Assist for transportation;Assistance with cooking/housework;Supervision due to cognitive status;A lot of help with walking and/or transfers;A lot of help with bathing/dressing/bathroom;Direct supervision/assist for medications management;Direct supervision/assist for financial management   Can travel by private vehicle   No    Equipment Recommendations    Recommendations for Other  Services       Functional Status Assessment Patient has had a recent decline in their functional status and demonstrates the ability to make significant improvements in function in a reasonable and predictable amount of time.     Precautions / Restrictions Precautions Precautions: Fall Restrictions Weight Bearing Restrictions Per Provider Order: No      Mobility  Bed Mobility Overal bed mobility: Needs Assistance Bed Mobility: Supine to Sit     Supine to sit: Mod assist     General bed mobility comments: pt requires physical assist to initiate movement to  EOB, cues for lateral scooting.  mod A for trunk elevation and use of bed pad to facilitate hips fully to  EOB.    Transfers Overall transfer level: Needs assistance Equipment used: Rolling walker (2 wheels) Transfers: Sit to/from Stand Sit to Stand: Min assist, +2 safety/equipment, +2 physical assistance   Step pivot transfers: Min assist, +2 safety/equipment, +2 physical assistance       General transfer comment: cues for hand placement and foot position.  +2 min assist with anterior-superior wt translation. cues for sequencing standpivot bed >chair, tactile cues and light support L knee to prevent buckling during wt shift to LLE. +2 min assist for balance and safety. incr time needed for all transisitons.    Ambulation/Gait                  Stairs            Wheelchair Mobility     Tilt Bed    Modified Rankin (Stroke Patients Only)       Balance Overall balance assessment: Needs assistance Sitting-balance support: Single extremity supported, No upper  extremity supported Sitting balance-Leahy Scale: Fair Sitting balance - Comments: initial posterior imbalance needing variable CGA to min assist to correct at times   Standing balance support: Reliant on assistive device for balance, Bilateral upper extremity supported, During functional activity Standing balance-Leahy Scale: Poor Standing  balance comment: reliant on UEs and external assist for safe static standing                             Pertinent Vitals/Pain Pain Assessment Pain Assessment: No/denies pain Pain Descriptors / Indicators: Aching, Discomfort, Grimacing, Guarding, Moaning    Home Living Family/patient expects to be discharged to:: Private residence Living Arrangements: Children Available Help at Discharge: Family;Available 24 hours/day Type of Home: House Home Access: Stairs to enter   [employer] of Steps: threshold   Home Layout: One level Home Equipment: Rollator (4 wheels);Wheelchair - Forensic psychologist (2 wheels);Shower seat;Cane - single point Additional Comments: lives with dtr    Prior Function Prior Level of Function : Needs assist       Physical Assist : Mobility (physical);ADLs (physical) Mobility (physical): Transfers;Gait;Bed mobility ADLs (physical): Bathing;Dressing;Toileting;IADLs Mobility Comments: not walking since last admssion, dtr reports pt has had falls and care has been more demanding than what she is able to provide. ADLs Comments: Family assists with ADL's as needed.     Extremity/Trunk Assessment   Upper Extremity Assessment Upper Extremity Assessment: Defer to OT evaluation    Lower Extremity Assessment LLE Deficits / Details: hip flexors 3/5, knee ext 3/5, ankle appears WFL not fully tested  Impaired gross motor LLE     Communication        Cognition Arousal: Alert Behavior During Therapy: Flat affect   PT - Cognitive impairments: Initiation, Sequencing, Problem solving                         Following commands: Impaired  Follows one step commands with incr time. Delayed processing      Cueing Cueing Techniques: Verbal cues, Tactile cues, Gestural cues     General Comments      Exercises     Assessment/Plan    PT Assessment Patient needs continued PT services  PT Problem List Decreased  strength;Decreased activity tolerance;Decreased balance;Decreased mobility;Decreased safety awareness;Decreased cognition;Decreased coordination       PT Treatment Interventions DME instruction;Balance training;Gait training;Functional mobility training;Therapeutic activities;Therapeutic exercise;Patient/family education;Neuromuscular re-education    PT Goals (Current goals can be found in the Care Plan section)  Acute Rehab PT Goals Patient Stated Goal: to improve mobility per dtr PT Goal Formulation: With patient Time For Goal Achievement: 11/27/23 Potential to Achieve Goals: Good    Frequency Min 2X/week     Co-evaluation               AM-PAC PT 6 Clicks Mobility  Outcome Measure Help needed turning from your back to your side while in a flat bed without using bedrails?: A Lot Help needed moving from lying on your back to sitting on the side of a flat bed without using bedrails?: A Lot Help needed moving to and from a bed to a chair (including a wheelchair)?: A Lot Help needed standing up from a chair using your arms (e.g., wheelchair or bedside chair)?: A Lot Help needed to walk in hospital room?: Total Help needed climbing 3-5 steps with a railing? : Total 6 Click Score: 10    End of Session Equipment Utilized  During Treatment: Gait belt Activity Tolerance: Patient limited by fatigue Patient left: in chair;with call bell/phone within reach;with chair alarm set Nurse Communication: Mobility status PT Visit Diagnosis: Unsteadiness on feet (R26.81);Other abnormalities of gait and mobility (R26.89);Muscle weakness (generalized) (M62.81)    Time: 8770-8745 PT Time Calculation (min) (ACUTE ONLY): 25 min   Charges:   PT Evaluation $PT Eval Low Complexity: 1 Low PT Treatments $Therapeutic Activity: 8-22 mins PT General Charges $$ ACUTE PT VISIT: 1 Visit         Navreet Bolda, PT  Acute Rehab Dept [facility]) [phone]  11/13/2023   [facility] 11/13/2023, 1:26  PM

## 2023-11-13 NOTE — Plan of Care (Signed)

## 2023-11-14 ENCOUNTER — Other Ambulatory Visit: Payer: Self-pay

## 2023-11-14 DIAGNOSIS — R531 Weakness: Secondary | ICD-10-CM | POA: Diagnosis not present

## 2023-11-14 MED ORDER — MELATONIN 5 MG PO TABS
5.0000 mg | ORAL_TABLET | Freq: Every evening | ORAL | Status: AC | PRN
  Administered 2023-11-14 – 2023-11-15 (×2): 5 mg via ORAL
  Filled 2023-11-14 (×2): qty 1

## 2023-11-14 NOTE — Progress Notes (Signed)
 PROGRESS NOTE    Andrea Mullen  FMW:[id number] DOB: 01/20/1966 DOA: 11/11/2023 PCP: [facility]   Brief Narrative:  58 y.o. 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  She presented with weakness and falls.  On presentation, hemoglobin was 12.7.  CT head showed no acute intracranial abnormality.  CT cervical spine showed no fractures.  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  She was started on IV antibiotics.  Oncology team has involved IR who is planning for possible initiation of radiation next week.  Assessment & Plan:   Weakness and falls - Possibly in the setting of newly diagnosed lung cancer and SVC thrombosis along with poor oral intake, depression,?  Infection of the chest and moderate to severe aortic stenosis -Continue telemetry monitoring.  PT/OT recommends SNF placement.  Consult TOC. - Fall precautions  Cavitary mass in the right upper lobe with concern for possible infection - Imaging revealed cavitary lung mass filled with fluid, cannot rule out infection.  I reviewed the images with Dr.  Delcie on 11/13/2023 who recommended antibiotic treatment along with chest physiotherapy and hypertonic saline.  Continue broad-spectrum antibiotics.  New diagnosis of non-small cell lung cancer History of small cell lung cancer status post radiation and chemotherapy at [facility] 2007 - Recently diagnosed with non-small cell lung cancer/squamous cell carcinoma during recent hospitalization in September 2025.  Oncology was planning to start radiation next week.  Oncology team has involved IR who is planning for possible initiation of radiation next week.  PET scan pending as an outpatient.  Head and neck thrombosis -During recent hospitalization in 10/2023 found to have acute thrombus involving SVC, brachiocephalic vein, azygous vein, right axillary vein and right subclavian vein. Also dural sinus thrombosis -repeat imaging continues to show above findings  -continue eliquis    History of present acute ischemic stroke involving the right splenium and right pons: Per neurology, the etiology was likely small vessel disease Hyperlipidemia -Continue high-intensity statin and Eliquis .  Outpatient follow-up with neurology.  PT following  Moderate to severe aortic stenosis - Will need outpatient cardiology evaluation.  I communicated with cardiology on-call/Dr. Azobou Tonleu on 11/13/2023 via secure chat: He reviewed the echo and thought that patient has moderate aortic stenosis and can be safely followed as an outpatient with cardiology.  CAD Hypertension - Continue medical management.  Currently not on any antihypertensive regimen.  Anemia of chronic disease -From chronic illnesses.  Hemoglobin stable will monitor intermittently  History of tobacco abuse and cocaine  abuse--apparently, last use of cocaine  was 2 years ago.  Has not smoked in a month.   DVT prophylaxis: Eliquis  Code Status: Full Family Communication: Daughter at bedside Disposition Plan: Status is: inpatient because: Of severity  of illness.    Consultants: IR.  Oncology  has been added to care teams.  Procedures: None  Antimicrobials: Cefepime and vancomycin from 11/12/2023 onwards   Subjective: Patient seen and examined at bedside.  Continues to feel weak with intermittent cough and shortness of breath.  No fever or vomiting reported. Objective: Vitals:   11/13/23 1700 11/13/23 2018 11/14/23 0145 11/14/23 0559  BP:  103/68 (!) 104/59 109/76  Pulse:  (!) 113 (!) 101 93  Resp: 15 18 18 18   Temp:  99 F (37.2 C) 98.4 F (36.9 C) 98.6 F (37 C)  TempSrc:  Oral Oral Oral  SpO2:  100% 95% 100%  Weight:      Height:        Intake/Output Summary (Last 24 hours) at 11/14/2023 0849 Last data filed at 11/13/2023 0915 Gross per 24 hour  Intake --  Output 300 ml  Net -300 ml   Filed Weights   11/11/23 1720  Weight: 79.4 kg    Examination:  General: On room air.  No distress ENT/neck: No thyromegaly.  JVD is not elevated  respiratory: Decreased breath sounds at bases bilaterally with some crackles; no wheezing  CVS: S1-S2 heard, rate controlled currently Abdominal: Soft, nontender, slightly distended; no organomegaly, bowel sounds are heard Extremities: Trace lower extremity edema; no cyanosis  CNS: Awake and alert.  Extremely slow to respond and a poor historian.  No focal neurologic deficit.  Moves extremities Lymph: No obvious lymphadenopathy Skin: No obvious ecchymosis/lesions  psych: Mostly flat affect with no signs of agitation. musculoskeletal: No obvious joint swelling/deformity      Data Reviewed: I have personally reviewed following labs and imaging studies  CBC: Recent Labs  Lab 11/11/23 1834 11/13/23 0710  WBC 4.6 4.1  NEUTROABS 3.5  --   HGB 10.7* 9.7*  HCT 33.3* 32.1*  MCV 88.1 89.9  PLT 207 190   Basic Metabolic Panel: Recent Labs  Lab 11/11/23 1834 11/13/23 0710  NA 136 136  K 4.2 4.1  CL 99 103  CO2 26 22  GLUCOSE 127* 100*  BUN 8 13  CREATININE 0.73 0.59   CALCIUM  10.1 9.3   GFR: Estimated Creatinine Clearance: 80.8 mL/min (by C-G formula based on SCr of 0.59 mg/dL). Liver Function Tests: Recent Labs  Lab 11/11/23 1834  AST 28  ALT 16  ALKPHOS 64  BILITOT 0.6  PROT 8.0  ALBUMIN 3.6   No results for input(s): LIPASE, AMYLASE in the last 168 hours. No results for input(s): AMMONIA in the last 168 hours. Coagulation Profile: No results for input(s): INR, PROTIME in the last 168 hours. Cardiac Enzymes: No results for input(s): CKTOTAL, CKMB, CKMBINDEX, TROPONINI in the last 168 hours. BNP (last 3 results) Recent Labs    10/18/23 1630  PROBNP 781.0*   HbA1C: No results for input(s): HGBA1C in the last 72 hours. CBG: No results for input(s): GLUCAP in the last 168 hours. Lipid Profile: No results for input(s): CHOL, HDL, LDLCALC, TRIG, CHOLHDL, LDLDIRECT in the last 72 hours. Thyroid  Function Tests: Recent Labs    11/13/23 0710  TSH 3.340   Anemia Panel: No results for input(s): VITAMINB12, FOLATE, FERRITIN, TIBC, IRON, RETICCTPCT in the last 72 hours. Sepsis Labs: Recent Labs  Lab 11/12/23 1408 11/13/23 0710  PROCALCITON <0.10 <0.10    No results found for this or any previous visit (from the past 240 hours).       Radiology Studies: No results found.       Scheduled Meds:  apixaban   5 mg Oral BID  Gerhardt's butt cream   Topical TID   rosuvastatin   40 mg Oral QHS   Continuous Infusions:  ceFEPime (MAXIPIME) IV 2 g (11/14/23 0026)   vancomycin 1,500 mg (11/13/23 1655)          Sophie Mao, MD [facility] 11/14/2023, 8:49 AM

## 2023-11-14 NOTE — Plan of Care (Signed)

## 2023-11-14 NOTE — Plan of Care (Signed)
  Problem: Education: Goal: Knowledge of General Education information will improve Description: Including pain rating scale, medication(s)/side effects and non-pharmacologic comfort measures Outcome: Progressing   Problem: Nutrition: Goal: Adequate nutrition will be maintained Outcome: Progressing   Problem: Elimination: Goal: Will not experience complications related to bowel motility Outcome: Progressing   Problem: Pain Managment: Goal: General experience of comfort will improve and/or be controlled Outcome: Progressing   Problem: Safety: Goal: Ability to remain free from injury will improve Outcome: Progressing

## 2023-11-15 ENCOUNTER — Inpatient Hospital Stay

## 2023-11-15 ENCOUNTER — Inpatient Hospital Stay: Admitting: Internal Medicine

## 2023-11-15 DIAGNOSIS — I82210 Acute embolism and thrombosis of superior vena cava: Secondary | ICD-10-CM

## 2023-11-15 DIAGNOSIS — R531 Weakness: Secondary | ICD-10-CM | POA: Diagnosis not present

## 2023-11-15 DIAGNOSIS — C3491 Malignant neoplasm of unspecified part of right bronchus or lung: Secondary | ICD-10-CM | POA: Diagnosis not present

## 2023-11-15 DIAGNOSIS — Z8673 Personal history of transient ischemic attack (TIA), and cerebral infarction without residual deficits: Secondary | ICD-10-CM | POA: Diagnosis not present

## 2023-11-15 LAB — MRSA NEXT GEN BY PCR, NASAL: MRSA by PCR Next Gen: NOT DETECTED

## 2023-11-15 NOTE — Plan of Care (Signed)

## 2023-11-15 NOTE — Progress Notes (Signed)
 PROGRESS NOTE    Andrea Mullen  FMW:[id number] DOB: 10-03-1965 DOA: 11/11/2023 PCP: [facility]   Brief Narrative:  58 y.o. 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  She presented with weakness and falls.  On presentation, hemoglobin was 12.7.  CT head showed no acute intracranial abnormality.  CT cervical spine showed no fractures.  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  She was started on IV antibiotics.  Oncology team has involved IR who is planning for possible initiation of radiation next week.  PT recommended SNF placement.  Assessment & Plan:   Weakness and falls - Possibly in the setting of newly diagnosed lung cancer and SVC thrombosis along with poor oral intake, depression,?  Infection of the chest and moderate to severe aortic stenosis -Continue telemetry monitoring.  PT/OT recommends SNF placement.  Consulted TOC. - Fall precautions  Cavitary mass in the right upper lobe with concern for possible infection - Imaging revealed cavitary lung mass filled with fluid, cannot rule out infection.  I  reviewed the images with Dr. Delcie on 11/13/2023 who recommended antibiotic treatment . Continue broad-spectrum antibiotics.  New diagnosis of non-small cell lung cancer History of small cell lung cancer status post radiation and chemotherapy at [facility] 2007 - Recently diagnosed with non-small cell lung cancer/squamous cell carcinoma during recent hospitalization in September 2025.  Oncology was planning to start radiation next week.  Oncology team has involved IR who is planning for possible initiation of radiation this week.  PET scan pending as an outpatient.  Head and neck thrombosis -During recent hospitalization in 10/2023 found to have acute thrombus involving SVC, brachiocephalic vein, azygous vein, right axillary vein and right subclavian vein. Also dural sinus thrombosis -repeat imaging continues to show above findings  -continue eliquis    History of present acute ischemic stroke involving the right splenium and right pons: Per neurology, the etiology was likely small vessel disease Hyperlipidemia -Continue high-intensity statin and Eliquis .  Outpatient follow-up with neurology.  PT following  Moderate to severe aortic stenosis - Will need outpatient cardiology evaluation.  I communicated with cardiology on-call/Dr. Azobou Tonleu on 11/13/2023 via secure chat: He reviewed the echo and thought that patient has moderate aortic stenosis and can be safely followed as an outpatient with cardiology.  CAD Hypertension - Continue medical management.  Currently not on any antihypertensive regimen.  Anemia of chronic disease -From chronic illnesses.  Hemoglobin stable will monitor intermittently  History of tobacco abuse and cocaine  abuse--apparently, last use of cocaine  was 2 years ago.  Has not smoked in a month.   DVT prophylaxis: Eliquis  Code Status: Full Family Communication: Daughter at bedside Disposition Plan: Status is: inpatient because: Of severity of  illness.    Consultants: IR.  Oncology has been  added to care teams.  Procedures: None  Antimicrobials: Cefepime and vancomycin from 11/12/2023 onwards   Subjective: Patient seen and examined at bedside.  No chest pain, vomiting, fever reported.  Still weak and short of breath intermittently. Objective: Vitals:   11/14/23 1314 11/14/23 2121 11/15/23 0417 11/15/23 0801  BP: 121/78 106/73 113/83 103/64  Pulse: (!) 110 (!) 107 97 99  Resp: 16  18 18   Temp:   97.6 F (36.4 C) 98.3 F (36.8 C)  TempSrc:   Oral Oral  SpO2: 99%  100% 100%  Weight:      Height:        Intake/Output Summary (Last 24 hours) at 11/15/2023 0836 Last data filed at 11/15/2023 0351 Gross per 24 hour  Intake 1334.73 ml  Output 1100 ml  Net 234.73 ml   Filed Weights   11/11/23 1720  Weight: 79.4 kg    Examination:  General: No acute distress.  Remains on room air. ENT/neck: No obvious neck masses or JVD elevation noted  respiratory: Bilateral decreased breath sounds at bases with scattered crackles CVS: Rate mostly controlled; S1 and S2 are heard  abdominal: Soft, nontender, distended mildly; no organomegaly, bowel sounds are heard normally Extremities: No clubbing; mild lower extremity edema present CNS: Alert and oriented.  Remains slow to respond and a poor historian.  No obvious focal neurologic deficit.  Lymph: No obvious palpable lymphadenopathy Skin: No obvious petechiae/lesions  psych: Flat affect with no signs of agitation musculoskeletal: No obvious joint tenderness/erythema      Data Reviewed: I have personally reviewed following labs and imaging studies  CBC: Recent Labs  Lab 11/11/23 1834 11/13/23 0710  WBC 4.6 4.1  NEUTROABS 3.5  --   HGB 10.7* 9.7*  HCT 33.3* 32.1*  MCV 88.1 89.9  PLT 207 190   Basic Metabolic Panel: Recent Labs  Lab 11/11/23 1834 11/13/23 0710  NA 136 136  K 4.2 4.1  CL 99 103  CO2 26 22  GLUCOSE 127* 100*  BUN 8 13  CREATININE 0.73 0.59   CALCIUM  10.1 9.3   GFR: Estimated Creatinine Clearance: 80.8 mL/min (by C-G formula based on SCr of 0.59 mg/dL). Liver Function Tests: Recent Labs  Lab 11/11/23 1834  AST 28  ALT 16  ALKPHOS 64  BILITOT 0.6  PROT 8.0  ALBUMIN 3.6   No results for input(s): LIPASE, AMYLASE in the last 168 hours. No results for input(s): AMMONIA in the last 168 hours. Coagulation Profile: No results for input(s): INR, PROTIME in the last 168 hours. Cardiac Enzymes: No results for input(s): CKTOTAL, CKMB, CKMBINDEX, TROPONINI in the last 168 hours. BNP (last 3 results) Recent Labs    10/18/23 1630  PROBNP 781.0*   HbA1C: No results for input(s): HGBA1C in the last 72 hours. CBG: No results for input(s): GLUCAP in the last 168 hours. Lipid Profile: No results for input(s): CHOL, HDL, LDLCALC, TRIG, CHOLHDL, LDLDIRECT in the last 72 hours. Thyroid  Function Tests: Recent Labs    11/13/23 0710  TSH 3.340   Anemia Panel: No results for input(s): VITAMINB12, FOLATE, FERRITIN, TIBC, IRON, RETICCTPCT in the last 72 hours. Sepsis Labs: Recent Labs  Lab 11/12/23 1408 11/13/23 0710  PROCALCITON <0.10 <0.10    No results found for this or any previous visit (from the past 240 hours).       Radiology Studies: No results found.       Scheduled Meds:  apixaban   5 mg Oral BID   Gerhardt's butt  cream   Topical TID   rosuvastatin   40 mg Oral QHS   Continuous Infusions:  ceFEPime (MAXIPIME) IV 2 g (11/15/23 0826)   vancomycin Stopped (11/14/23 1800)          Sophie Mao, MD [facility] 11/15/2023, 8:36 AM

## 2023-11-15 NOTE — Plan of Care (Signed)
 Pt turned Q2H to decrease increased skin break down.   Problem: Coping: Goal: Level of anxiety will decrease Outcome: Progressing   Problem: Elimination: Goal: Will not experience complications related to bowel motility Outcome: Progressing   Problem: Pain Managment: Goal: General experience of comfort will improve and/or be controlled Outcome: Progressing   Problem: Safety: Goal: Ability to remain free from injury will improve Outcome: Progressing

## 2023-11-15 NOTE — Progress Notes (Addendum)
 Andrea Mullen   DOB:March 23, 1965   FM#:[id number]      ASSESSMENT & PLAN:  Andrea Mullen.  Andrea Mullen is a 58 year old female patient with oncologic history significant for newly diagnosed non-small cell lung cancer and previous history of small cell lung cancer limited stage.  She was admitted on 11/12/2023 with complaints of weakness and falls.  Medical oncology following.  Non-small cell lung cancer, new diagnosis History of Small Cell lung cancer -- Initially diagnosed 2007 with small cell lung cancer and treated at [facility].  -- Status post radiation therapy and chemotherapy with no surgical intervention at that time. -- Imaging 10/18/23 showed lesion within posterior aspect of RUL.  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 - Radiation oncology following. - Patient was scheduled to see Dr. Gatha in outpatient oncology clinic and to start cycle 1 carbo + Taxol today 11/15/2023.  However now delayed due to this admission. -- Medical Oncology/Dr. Sherrod following and will make further recommendations.  Progressive weakness Falls at home -- Patient's daughter reports that patient was progressively becoming weaker and was falling at home. - CT head done shows no acute intracranial abnormality. 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  -- Was started on Coumadin  previously.  Now on Eliquis , continue as ordered.  - Monitor for bleeding   Anemia, normocytic -- Hemoglobin 9.7 on 11/13/2023 -- No transfusional intervention required -- Continue to monitor CBC with differential   History of CVA -- MR brain 9/9 shows 6 mm nonhemorrhagic infarct.  Imaging recommends follow-up in 3 months. -- Follow-up with PCP      Code Status Full  Subjective:   Patient seen resting comfortably in bed.  Patient's daughter at bedside.  Patient would not open her eyes or answer questions during assessment and exam, states I want to sleep leave me alone.  Patient's daughter reports that she had been getting progressively weaker, falling at home and had been complaining of pain in her feet.  Objective:   Intake/Output Summary (Last 24 hours) at 11/15/2023 1039 Last data filed at 11/15/2023 0351 Gross per 24 hour  Intake 1334.73 ml  Output 1100 ml  Net 234.73 ml     PHYSICAL EXAMINATION: ECOG PERFORMANCE STATUS: 4 - Bedbound  Vitals:   11/15/23 0417 11/15/23 0801  BP: 113/83 103/64  Pulse: 97 99  Resp: 18 18  Temp: 97.6 F (36.4 C) 98.3 F (36.8 C)  SpO2: 100% 100%   Filed Weights   11/11/23 1720  Weight: 175 lb (79.4 kg)    GENERAL: alert, no distress and comfortable + chronically ill-appearing SKIN: skin color, texture, turgor are normal, no rashes or significant lesions EYES: normal, conjunctiva are pink and non-injected, sclera clear OROPHARYNX: no exudate, no erythema and lips, buccal mucosa, and tongue normal  NECK: supple, thyroid  normal size, non-tender, without nodularity LYMPH: no palpable lymphadenopathy in the cervical, axillary or inguinal LUNGS: clear to auscultation and percussion with normal breathing effort HEART: regular rate & rhythm and no murmurs and no lower extremity edema ABDOMEN: abdomen soft, non-tender and normal bowel sounds MUSCULOSKELETAL: + Impaired mobility  PSYCH: alert & oriented x 3 with fluent speech NEURO: no focal motor/sensory deficits   All questions were answered. The patient knows to call the clinic with any problems, questions or concerns.  The total time spent in the appointment was 40 minutes encounter with patient including review of chart and various tests results, discussions about plan of care and coordination of care plan  Olam JINNY Brunner, NP 11/15/2023 10:39 AM    Labs  Reviewed:  Lab Results  Component Value Date   WBC 4.1 11/13/2023   HGB 9.7 (L) 11/13/2023   HCT 32.1 (L) 11/13/2023   MCV 89.9 11/13/2023   PLT 190 11/13/2023   Recent Labs    10/18/23 1630 10/20/23 0246 10/24/23 0150 10/25/23 1237 11/03/23 0922 11/11/23 1834 11/13/23 0710  NA 136   < > 135   < > 139 136 136  K 3.8   < > 4.2   < > 3.8 4.2 4.1  CL 100   < > 100   < > 105 99 103  CO2 24   < > 24   < > 31 26 22   GLUCOSE 105*   < > 107*   < > 120* 127* 100*  BUN 14   < > 10   < > 11 8 13   CREATININE 0.71   < > 0.75   < > 0.68 0.73 0.59  CALCIUM  9.6   < > 9.0   < > 9.4 10.1 9.3  GFRNONAA >60   < > >60   < > >60 >60 >60  PROT 7.8   < > 7.0  --  7.7 8.0  --   ALBUMIN 3.9   < > 2.9*  --  3.8 3.6  --   AST 21   < > 20  --  14* 28  --   ALT 13   < > 16  --  15 16  --   ALKPHOS 76   < > 59  --  52 64  --   BILITOT 0.5   < > 0.5  --  0.5 0.6  --   BILIDIR 0.2  --   --   --   --   --   --   IBILI 0.3  --   --   --   --   --   --    < > = values in this interval not displayed.    Studies Reviewed:  CT Angio Chest PE W and/or Wo Contrast Result Date: 11/11/2023 CLINICAL DATA:  Clemens 2 days ago with head and neck trauma and midline neck tenderness. Reports taking blood thinners. Increased leg weakness and swelling. Pulmonary embolism suspected. Prior history of lung cancer. EXAM: CT HEAD WITHOUT CONTRAST CT CERVICAL SPINE WITHOUT CONTRAST CT CHEST, ABDOMEN AND PELVIS WITH CONTRAST TECHNIQUE: Contiguous axial images were obtained from the base of the skull through the vertex without intravenous contrast. Multidetector CT imaging of the cervical spine was performed without intravenous contrast. Multiplanar CT image reconstructions were also generated. Multidetector CT imaging of the chest was performed following the standard protocol during bolus administration of intravenous contrast. Multiplanar CT image reconstructions and MIPS were obtained to evaluate the vascular anatomy. RADIATION DOSE  REDUCTION: This exam was performed according to the departmental dose-optimization program which includes automated exposure control, adjustment of the mA and/or kV according to patient size and/or use of iterative reconstruction technique. CONTRAST:  OMNIPAQUE  IOHEXOL  350 MG/ML SOLN COMPARISON:  MRI brain 10/19/2023, CTA head and neck and reconstructions 11/08/2021, soft tissue neck CT 10/18/2023, CTA chest 10/18/2023 and 10/14/2023. FINDINGS: CT HEAD FINDINGS Brain: There is moderately advanced atrophy, small-vessel disease and atrophic ventriculomegaly. Multiple  tiny chronic bilateral cerebellar lacunar infarcts are again noted, small chronic lacunar infarcts in the right thalamus and right pons, and a few bilateral chronic basal ganglia lacunae are again noted. No cortical based acute infarct, hemorrhage, mass or mass effect are seen. No new midline shift. Basal cisterns are clear. Vascular: Age advanced calcific plaque both siphons. No hyperdense vessel is seen. 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 If this is still present, it is not visible with noncontrast CT. Skull: Negative for fractures or focal lesions. Sinuses/Orbits: Negative orbits. Increased patchy opacification ethmoid air cells and increased membrane thickening both maxillary and frontal sinuses. The sphenoid sinuses clear. Again noted is a diffuse right mastoid effusion and partial opacification in the right middle ear. There is increased moderate patchy left mastoid effusion with clear left middle ear cavity. Other: None. CT CERVICAL FINDINGS Alignment: There is a straightened slightly reversed cervical lordosis. There is no listhesis. No widening of the anterior atlantodental joint. No C1-2 lateral mass offset. Skull base and vertebrae: No acute fracture is evident. No primary bone lesion or focal pathologic process. Soft tissues and  spinal canal: No prevertebral fluid or swelling. No visible canal hematoma. There is trace calcification at the carotid bifurcations. There is no thyroid  or laryngeal mass. Unremarkable parotid and submandibular glands. Disc levels: The C5-6 disc is degenerative with small bidirectional osteophytes. The other discs are normal in heights. No significant soft tissue or bony encroachment on the thecal sac and cord is seen. There are slight facet spurring changes at most levels, uncinate spurring at C5-6. Foraminal stenosis is only seen at C5-6 where it is mild. The other foramina appear to be patent. Small anterior endplate spurring is seen at C3-4, C4-5 and C6-7 but no bulky osteophytes or bridging bone. Other:  None. CT CHEST FINDINGS Cardiovascular: There are extensive mediastinal, anterior chest wall and paraspinal venous collateral vessels filling with dense contrast. 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 The SVC was better demonstrated on last study due to the right-sided injection on that exam. Extensive but nonocclusive thrombus was previously noted in the right axillosubclavian vein and could still be present, but this is not evaluated due to the left-sided injection today. The distal SVC and cavoatrial junction, and the cardiac chambers opacify well. Nonocclusive thrombus in the azygous vein is also again noted. Partially occlusive thrombus also appears to still be present within an engorged right internal mammary vein on series 8 images 40-54. The heart is slightly enlarged, with left ventricular and septal hypertrophy and patchy calcification of the mitral ring. There is no enlargement of the right chambers. Small pericardial effusion is again noted. There are normal caliber pulmonary arteries, adequately opacified to the segmental level with no evidence of arterial embolism,  but the subsegmental arteries largely unopacified and not evaluated. There are moderate patchy calcific plaques in the aorta and proximal great vessels skull with heavy calcifications and thickening of the aortic valve leaflets without aortic aneurysm, dissection or stenosis. There are scattered three-vessel coronary artery calcific plaques. The pulmonary veins are normal in caliber. Mediastinum/Nodes: Stable prominent right mid hilar lymph node of 1.4 cm short axis on 8:54. Right paratracheal area is difficult to assess due to the engorged thrombosed SVC but no new abnormality is seen in the area. I do  not see further adenopathy. Lungs/Pleura: Interval new small layering right pleural effusion. No left pleural effusion. Right upper lobe irregularly thick-walled cavitary lesion posteriorly again noted measuring 5.1 x 4.1 cm, not notably changed in size but with increased fluid now almost completely filling the cavity. Infectious complication not excluded. There are scarring changes, mild volume loss in the right middle lobe. Stable 7 mm ground-glass right upper lobe nodule on 10:66. Remaining lungs are generally clear. Upper abdomen: No acute abnormality. Musculoskeletal: No regional bone metastases. Review of the MIP images confirms the above findings. IMPRESSION: 1. No acute intracranial CT findings or depressed skull fractures. 2. Atrophy, small-vessel disease and multiple chronic lacunar infarcts. 3. Sinus disease with increased membrane thickening in the frontal and maxillary sinuses, increased patchy opacification of the ethmoid air cells. 4. Persistent right mastoid effusion and partial opacification of the right middle ear. Interval increased left mastoid effusion. 5. No evidence of cervical fractures or malalignment. 6. C5-6 degenerative disc disease and spondylosis with mild foraminal stenosis. 7. No evidence of pulmonary arterial dilatation or embolus through the segmental level. Subsegmental arteries are  largely unopacified and not evaluated. 8. 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 9. Not well evaluated, there may still be nonocclusive thrombus in the right axillosubclavian vein and azygous vein, and there is partially occlusive thrombus in the right internal mammary vein. 10. Interval new small layering right pleural effusion. 11. 5.1 x 4.1 cm right upper lobe cavitary mass is now almost fully filled with fluid. Infectious complication not excluded. 12. Stable 7 mm ground-glass right upper lobe nodule. 13. Stable prominent right mid hilar lymph node. 14. Aortic and coronary artery atherosclerosis. Heavily calcified thickened aortic valve leaflets. Aortic Atherosclerosis (ICD10-I70.0). Electronically Signed   By: Francis Quam M.D.   On: 11/11/2023 20:59   CT Head Wo Contrast Result Date: 11/11/2023 CLINICAL DATA:  Clemens 2 days ago with head and neck trauma and midline neck tenderness. Reports taking blood thinners. Increased leg weakness and swelling. Pulmonary embolism suspected. Prior history of lung cancer. EXAM: CT HEAD WITHOUT CONTRAST CT CERVICAL SPINE WITHOUT CONTRAST CT CHEST, ABDOMEN AND PELVIS WITH CONTRAST TECHNIQUE: Contiguous axial images were obtained from the base of the skull through the vertex without intravenous contrast. Multidetector CT imaging of the cervical spine was performed without intravenous contrast. Multiplanar CT image reconstructions were also generated. Multidetector CT imaging of the chest was performed following the standard protocol during bolus administration of intravenous contrast. Multiplanar CT image reconstructions and MIPS were obtained to evaluate the vascular anatomy. RADIATION DOSE REDUCTION: This exam was performed according to the departmental dose-optimization program which includes automated exposure  control, adjustment of the mA and/or kV according to patient size and/or use of iterative reconstruction technique. CONTRAST:  OMNIPAQUE  IOHEXOL  350 MG/ML SOLN COMPARISON:  MRI brain 10/19/2023, CTA head and neck and reconstructions 11/08/2021, soft tissue neck CT 10/18/2023, CTA chest 10/18/2023 and 10/14/2023. FINDINGS: CT HEAD FINDINGS Brain: There is moderately advanced atrophy, small-vessel disease and atrophic ventriculomegaly. Multiple tiny chronic bilateral cerebellar lacunar infarcts are again noted, small chronic lacunar infarcts in the right thalamus and right pons, and a few bilateral chronic basal ganglia lacunae are again noted. No cortical based acute infarct, hemorrhage, mass or mass effect are seen. No new midline shift. Basal cisterns are clear. Vascular: Age advanced calcific plaque both siphons. No hyperdense vessel is seen. The  prior study demonstrated dural venous sinus thrombosis in the right transverse and sigmoid sinuses with extension into the jugular bulb. If this is still present, it is not visible with noncontrast CT. Skull: Negative for fractures or focal lesions. Sinuses/Orbits: Negative orbits. Increased patchy opacification ethmoid air cells and increased membrane thickening both maxillary and frontal sinuses. The sphenoid sinuses clear. Again noted is a diffuse right mastoid effusion and partial opacification in the right middle ear. There is increased moderate patchy left mastoid effusion with clear left middle ear cavity. Other: None. CT CERVICAL FINDINGS Alignment: There is a straightened slightly reversed cervical lordosis. There is no listhesis. No widening of the anterior atlantodental joint. No C1-2 lateral mass offset. Skull base and vertebrae: No acute fracture is evident. No primary bone lesion or focal pathologic process. Soft tissues and spinal canal: No prevertebral fluid or swelling. No visible canal hematoma. There is trace calcification at the carotid  bifurcations. There is no thyroid  or laryngeal mass. Unremarkable parotid and submandibular glands. Disc levels: The C5-6 disc is degenerative with small bidirectional osteophytes. The other discs are normal in heights. No significant soft tissue or bony encroachment on the thecal sac and cord is seen. There are slight facet spurring changes at most levels, uncinate spurring at C5-6. Foraminal stenosis is only seen at C5-6 where it is mild. The other foramina appear to be patent. Small anterior endplate spurring is seen at C3-4, C4-5 and C6-7 but no bulky osteophytes or bridging bone. Other:  None. CT CHEST FINDINGS Cardiovascular: There are extensive mediastinal, anterior chest wall and paraspinal venous collateral vessels filling with dense contrast. 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 The SVC was better demonstrated on last study due to the right-sided injection on that exam. Extensive but nonocclusive thrombus was previously noted in the right axillosubclavian vein and could still be present, but this is not evaluated due to the left-sided injection today. The distal SVC and cavoatrial junction, and the cardiac chambers opacify well. Nonocclusive thrombus in the azygous vein is also again noted. Partially occlusive thrombus also appears to still be present within an engorged right internal mammary vein on series 8 images 40-54. The heart is slightly enlarged, with left ventricular and septal hypertrophy and patchy calcification of the mitral ring. There is no enlargement of the right chambers. Small pericardial effusion is again noted. There are normal caliber pulmonary arteries, adequately opacified to the segmental level with no evidence of arterial embolism, but the subsegmental arteries largely unopacified and not evaluated. There are moderate patchy calcific plaques in the  aorta and proximal great vessels skull with heavy calcifications and thickening of the aortic valve leaflets without aortic aneurysm, dissection or stenosis. There are scattered three-vessel coronary artery calcific plaques. The pulmonary veins are normal in caliber. Mediastinum/Nodes: Stable prominent right mid hilar lymph node of 1.4 cm short axis on 8:54. Right paratracheal area is difficult to assess due to the engorged thrombosed SVC but no new abnormality is seen in the area. I do not see further adenopathy. Lungs/Pleura: Interval new small layering right pleural effusion. No left pleural effusion. Right upper lobe irregularly thick-walled cavitary lesion posteriorly again noted measuring 5.1 x 4.1 cm, not notably changed in size but with increased fluid now almost completely filling the cavity. Infectious complication not excluded. There are scarring changes, mild volume loss in the right middle lobe. Stable 7  mm ground-glass right upper lobe nodule on 10:66. Remaining lungs are generally clear. Upper abdomen: No acute abnormality. Musculoskeletal: No regional bone metastases. Review of the MIP images confirms the above findings. IMPRESSION: 1. No acute intracranial CT findings or depressed skull fractures. 2. Atrophy, small-vessel disease and multiple chronic lacunar infarcts. 3. Sinus disease with increased membrane thickening in the frontal and maxillary sinuses, increased patchy opacification of the ethmoid air cells. 4. Persistent right mastoid effusion and partial opacification of the right middle ear. Interval increased left mastoid effusion. 5. No evidence of cervical fractures or malalignment. 6. C5-6 degenerative disc disease and spondylosis with mild foraminal stenosis. 7. No evidence of pulmonary arterial dilatation or embolus through the segmental level. Subsegmental arteries are largely unopacified and not evaluated. 8. 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 9. Not well evaluated, there may still be nonocclusive thrombus in the right axillosubclavian vein and azygous vein, and there is partially occlusive thrombus in the right internal mammary vein. 10. Interval new small layering right pleural effusion. 11. 5.1 x 4.1 cm right upper lobe cavitary mass is now almost fully filled with fluid. Infectious complication not excluded. 12. Stable 7 mm ground-glass right upper lobe nodule. 13. Stable prominent right mid hilar lymph node. 14. Aortic and coronary artery atherosclerosis. Heavily calcified thickened aortic valve leaflets. Aortic Atherosclerosis (ICD10-I70.0). Electronically Signed   By: Francis Quam M.D.   On: 11/11/2023 20:59   CT Cervical Spine Wo Contrast Result Date: 11/11/2023 CLINICAL DATA:  Clemens 2 days ago with head and neck trauma and midline neck tenderness. Reports taking blood thinners. Increased leg weakness and swelling. Pulmonary embolism suspected. Prior history of lung cancer. EXAM: CT HEAD WITHOUT CONTRAST CT CERVICAL SPINE WITHOUT CONTRAST CT CHEST, ABDOMEN AND PELVIS WITH CONTRAST TECHNIQUE: Contiguous axial images were obtained from the base of the skull through the vertex without intravenous contrast. Multidetector CT imaging of the cervical spine was performed without intravenous contrast. Multiplanar CT image reconstructions were also generated. Multidetector CT imaging of the chest was performed following the standard protocol during bolus administration of intravenous contrast. Multiplanar CT image reconstructions and MIPS were obtained to evaluate the vascular anatomy. RADIATION DOSE REDUCTION: This exam was performed according to the departmental dose-optimization program which includes automated exposure control, adjustment of the mA and/or kV according to patient size and/or use of iterative reconstruction  technique. CONTRAST:  OMNIPAQUE  IOHEXOL  350 MG/ML SOLN COMPARISON:  MRI brain 10/19/2023, CTA head and neck and reconstructions 11/08/2021, soft tissue neck CT 10/18/2023, CTA chest 10/18/2023 and 10/14/2023. FINDINGS: CT HEAD FINDINGS Brain: There is moderately advanced atrophy, small-vessel disease and atrophic ventriculomegaly. Multiple tiny chronic bilateral cerebellar lacunar infarcts are again noted, small chronic lacunar infarcts in the right thalamus and right pons, and a few bilateral chronic basal ganglia lacunae are again noted. No cortical based acute infarct, hemorrhage, mass or mass effect are seen. No new midline shift. Basal cisterns are clear. Vascular: Age advanced calcific plaque both siphons. No hyperdense vessel is seen. 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 If this is still present, it is not visible with noncontrast CT. Skull: Negative for fractures or focal lesions. Sinuses/Orbits: Negative orbits. Increased patchy opacification ethmoid air cells and increased membrane thickening both maxillary and frontal sinuses. The sphenoid sinuses clear. Again noted  is a diffuse right mastoid effusion and partial opacification in the right middle ear. There is increased moderate patchy left mastoid effusion with clear left middle ear cavity. Other: None. CT CERVICAL FINDINGS Alignment: There is a straightened slightly reversed cervical lordosis. There is no listhesis. No widening of the anterior atlantodental joint. No C1-2 lateral mass offset. Skull base and vertebrae: No acute fracture is evident. No primary bone lesion or focal pathologic process. Soft tissues and spinal canal: No prevertebral fluid or swelling. No visible canal hematoma. There is trace calcification at the carotid bifurcations. There is no thyroid  or laryngeal mass. Unremarkable parotid and submandibular glands. Disc levels:  The C5-6 disc is degenerative with small bidirectional osteophytes. The other discs are normal in heights. No significant soft tissue or bony encroachment on the thecal sac and cord is seen. There are slight facet spurring changes at most levels, uncinate spurring at C5-6. Foraminal stenosis is only seen at C5-6 where it is mild. The other foramina appear to be patent. Small anterior endplate spurring is seen at C3-4, C4-5 and C6-7 but no bulky osteophytes or bridging bone. Other:  None. CT CHEST FINDINGS Cardiovascular: There are extensive mediastinal, anterior chest wall and paraspinal venous collateral vessels filling with dense contrast. 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 The SVC was better demonstrated on last study due to the right-sided injection on that exam. Extensive but nonocclusive thrombus was previously noted in the right axillosubclavian vein and could still be present, but this is not evaluated due to the left-sided injection today. The distal SVC and cavoatrial junction, and the cardiac chambers opacify well. Nonocclusive thrombus in the azygous vein is also again noted. Partially occlusive thrombus also appears to still be present within an engorged right internal mammary vein on series 8 images 40-54. The heart is slightly enlarged, with left ventricular and septal hypertrophy and patchy calcification of the mitral ring. There is no enlargement of the right chambers. Small pericardial effusion is again noted. There are normal caliber pulmonary arteries, adequately opacified to the segmental level with no evidence of arterial embolism, but the subsegmental arteries largely unopacified and not evaluated. There are moderate patchy calcific plaques in the aorta and proximal great vessels skull with heavy calcifications and thickening of the aortic valve leaflets  without aortic aneurysm, dissection or stenosis. There are scattered three-vessel coronary artery calcific plaques. The pulmonary veins are normal in caliber. Mediastinum/Nodes: Stable prominent right mid hilar lymph node of 1.4 cm short axis on 8:54. Right paratracheal area is difficult to assess due to the engorged thrombosed SVC but no new abnormality is seen in the area. I do not see further adenopathy. Lungs/Pleura: Interval new small layering right pleural effusion. No left pleural effusion. Right upper lobe irregularly thick-walled cavitary lesion posteriorly again noted measuring 5.1 x 4.1 cm, not notably changed in size but with increased fluid now almost completely filling the cavity. Infectious complication not excluded. There are scarring changes, mild volume loss in the right middle lobe. Stable 7 mm ground-glass right upper lobe nodule on 10:66. Remaining lungs are generally clear. Upper abdomen: No acute abnormality. Musculoskeletal: No regional bone metastases. Review of the MIP images confirms the above findings. IMPRESSION: 1. No acute intracranial CT findings or depressed skull fractures. 2. Atrophy, small-vessel disease and multiple chronic lacunar infarcts. 3. Sinus disease with increased membrane thickening in the frontal and  maxillary sinuses, increased patchy opacification of the ethmoid air cells. 4. Persistent right mastoid effusion and partial opacification of the right middle ear. Interval increased left mastoid effusion. 5. No evidence of cervical fractures or malalignment. 6. C5-6 degenerative disc disease and spondylosis with mild foraminal stenosis. 7. No evidence of pulmonary arterial dilatation or embolus through the segmental level. Subsegmental arteries are largely unopacified and not evaluated. 8. 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 9. Not well evaluated, there may still be nonocclusive thrombus in the right axillosubclavian vein and azygous vein, and there is partially occlusive thrombus in the right internal mammary vein. 10. Interval new small layering right pleural effusion. 11. 5.1 x 4.1 cm right upper lobe cavitary mass is now almost fully filled with fluid. Infectious complication not excluded. 12. Stable 7 mm ground-glass right upper lobe nodule. 13. Stable prominent right mid hilar lymph node. 14. Aortic and coronary artery atherosclerosis. Heavily calcified thickened aortic valve leaflets. Aortic Atherosclerosis (ICD10-I70.0). Electronically Signed   By: Francis Quam M.D.   On: 11/11/2023 20:59   US  Venous Img Lower Right (DVT Study) Result Date: 11/11/2023 EXAM: ULTRASOUND DUPLEX OF THE RIGHT LOWER EXTREMITY VEINS TECHNIQUE: Duplex ultrasound using B-mode/gray scaled imaging and Doppler spectral analysis and color flow was obtained of the deep venous structures of the right lower extremity. COMPARISON: None. CLINICAL HISTORY: Swelling, lung cancer. FINDINGS: The common femoral vein, femoral vein, popliteal vein, and posterior tibial vein of the right lower extremity demonstrate normal compressibility with normal color flow and spectral analysis. IMPRESSION: 1. No evidence of DVT. Electronically signed by: Ozell Daring MD 11/11/2023 07:10 PM EDT RP Workstation: [id number]   CT ABDOMEN PELVIS W CONTRAST Result Date: 10/25/2023 EXAM: CT ABDOMEN AND PELVIS WITH CONTRAST 10/25/2023 06:32:57 PM TECHNIQUE: CT of the abdomen and pelvis was performed with the administration of intravenous contrast. Multiplanar reformatted images are provided for review. Automated exposure control, iterative reconstruction, and/or weight-based adjustment of the mA/kV was utilized to reduce the radiation dose to as low as reasonably achievable. COMPARISON: None available. CLINICAL HISTORY: Metastatic disease evaluation. History of lung  cancer. Tracking code: Bo. FINDINGS: LOWER CHEST: Visualized lung bases are clear. LIVER: The liver is unremarkable. GALLBLADDER AND BILE DUCTS: Gallbladder is unremarkable. No biliary ductal dilatation. SPLEEN: No acute abnormality. PANCREAS: No acute abnormality. ADRENAL GLANDS: No acute abnormality. KIDNEYS, URETERS AND BLADDER: No stones in the kidneys or ureters. No hydronephrosis. No perinephric or periureteral stranding. Urinary bladder is unremarkable. GI AND BOWEL: Moderate descending and sigmoid colonic diverticulosis. The stomach, small bowel, and large bowel are otherwise unremarkable. Normal appendix. PERITONEUM AND RETROPERITONEUM: No ascites. No free air. VASCULATURE: Mild aortoiliac atherosclerotic calcification. Extensive calcification of the aortic valve leaflets. Suspected bicuspid aortic valve. Calcification of the mitral valve leaflets. Global cardiac size within normal limits. Echocardiography may be helpful to better assess the aortic valve morphology and the degree of valvular dysfunction. LYMPH NODES: No lymphadenopathy. REPRODUCTIVE ORGANS: Partially calcified uterine fibroids result in a lobulated morphology of the uterus. No adnexal masses are seen. BONES AND SOFT TISSUES: Osseous structures are age appropriate. No acute bone abnormality. No lytic or blastic bone lesion. 4.2 cm rounded subdermal lesion is seen within the left gluteal region which may represent a sebaceous cyst, but is not well characterized on this examination. Umbilical/ventral hernia repair with mesh has been performed. No recurrent abdominal wall hernia. RAF SCORE:  Aortic atherosclerosis (ICD10-I70.0) IMPRESSION: 1. No evidence of metastatic disease within the abdomen and pelvis. 2. Moderate descending and sigmoid colonic diverticulosis without evidence of diverticulitis. 3. Mild aortoiliac atherosclerotic calcification. Electronically signed by: Dorethia Molt MD 10/25/2023 09:28 PM EDT RP Workstation: [id number]    DG CHEST PORT 1 VIEW Result Date: 10/24/2023 CLINICAL DATA:  Shortness of breath. EXAM: PORTABLE CHEST 1 VIEW COMPARISON:  Chest radiograph dated 10/22/2023. FINDINGS: Cavitary mass in the right upper lobe measures 5.2 cm in diameter. No new consolidation. There is no pleural effusion pneumothorax. The cardiac silhouette is within normal limits. No acute osseous pathology. IMPRESSION: Cavitary mass in the right upper lobe. Electronically Signed   By: Vanetta Chou M.D.   On: 10/24/2023 13:50   DG CHEST PORT 1 VIEW Result Date: 10/22/2023 CLINICAL DATA:  Status post bronchoscopy EXAM: PORTABLE CHEST 1 VIEW COMPARISON:  October 18, 2023 FINDINGS: Redemonstration of cavitary right upper lobe lung mass. No new consolidations. No pleural effusions. No pneumothorax. Unchanged cardiomediastinal silhouette with known right paratracheal lymphadenopathy. No acute osseous findings. IMPRESSION: Unchanged right upper lobe cavitary lung mass and right paratracheal lymphadenopathy. No acute findings. Electronically Signed   By: Michaeline Blanch M.D.   On: 10/22/2023 11:58   ECHOCARDIOGRAM COMPLETE Result Date: 10/20/2023    ECHOCARDIOGRAM REPORT   Patient Name:   Andrea Mullen Wiltse Date of Exam: 10/20/2023 Medical Rec #:  [id number]        Height:       65.0 in Accession #:    [id number]       Weight:       216.3 lb Date of Birth:  06-13-1965        BSA:          2.045 m Patient Age:    57 years         BP:           104/73 mmHg Patient Gender: F                HR:           105 bpm. Exam Location:  Inpatient Procedure: 2D Echo, Cardiac Doppler and Color Doppler (Both Spectral and Color            Flow Doppler were utilized during procedure). Indications:    Pulmonary Embolus  History:        Patient has prior history of Echocardiogram examinations, most                 recent 11/09/2021. CAD, Stroke, Signs/Symptoms:Chest Pain; Risk                 Factors:Hypertension and Current Smoker.  Sonographer:    Juliene Rucks  Referring Phys: [phone] RONDELL A SMITH IMPRESSIONS  1. Left ventricular ejection fraction, by estimation, is 60 to 65%. The left ventricle has normal function. The left ventricle has no regional wall motion abnormalities. Left ventricular diastolic parameters are consistent with Grade I diastolic dysfunction (impaired relaxation). Elevated left ventricular end-diastolic pressure.  2. Right ventricular systolic function was not well visualized. The right ventricular size is not well visualized.  3. The mitral valve is degenerative. Trivial mitral valve regurgitation. Moderate to severe mitral annular calcification.  4. The aortic valve was not adequately interrogated to determine stenosis - there is calcification of the valve and aortic root. Image 67 demonstrates an aortic valve gradient of at least 30 mmHg peak and 14 mmHg, mean.. The aortic valve has an indeterminant  number of cusps. There is severe calcifcation of the aortic valve. Aortic valve regurgitation is trivial. Moderate to severe aortic valve stenosis.  5. The inferior vena cava is normal in size with greater than 50% respiratory variability, suggesting right atrial pressure of 3 mmHg. Comparison(s): Changes from prior study are noted. 11/09/2021: LVEF 60-65%, severe AS - possibly bicuspid aortic valve. Conclusion(s)/Recommendation(s): Consider further evaluation of the aortic valve with TEE. FINDINGS  Left Ventricle: Left ventricular ejection fraction, by estimation, is 60 to 65%. The left ventricle has normal function. The left ventricle has no regional wall motion abnormalities. The left ventricular internal cavity size was normal in size. There is  no left ventricular hypertrophy. Left ventricular diastolic parameters are consistent with Grade I diastolic dysfunction (impaired relaxation). Elevated left ventricular end-diastolic pressure. Right Ventricle: The right ventricular size is not well visualized. Right vetricular wall thickness was not well  visualized. Right ventricular systolic function was not well visualized. Left Atrium: Left atrial size was normal in size. Right Atrium: Right atrial size was normal in size. Pericardium: There is no evidence of pericardial effusion. Mitral Valve: The mitral valve is degenerative in appearance. Moderate to severe mitral annular calcification. Trivial mitral valve regurgitation. Tricuspid Valve: The tricuspid valve is not well visualized. Tricuspid valve regurgitation is not demonstrated. Aortic Valve: The aortic valve was not adequately interrogated to determine stenosis - there is calcification of the valve and aortic root. Image 67 demonstrates an aortic valve gradient of at least 30 mmHg peak and 14 mmHg, mean. The aortic valve has an  indeterminant number of cusps. There is severe calcifcation of the aortic valve. There is moderate aortic valve annular calcification. Aortic valve regurgitation is trivial. Moderate to severe aortic stenosis is present. Pulmonic Valve: The pulmonic valve was not well visualized. Pulmonic valve regurgitation is not visualized. Aorta: The aortic root and ascending aorta are structurally normal, with no evidence of dilitation. Venous: The inferior vena cava is normal in size with greater than 50% respiratory variability, suggesting right atrial pressure of 3 mmHg. IAS/Shunts: The interatrial septum was not well visualized.  LEFT VENTRICLE PLAX 2D LVIDd:         5.20 cm   Diastology LVIDs:         4.60 cm   LV e' medial:    4.24 cm/s LV PW:         0.80 cm   LV E/e' medial:  30.2 LV IVS:        0.90 cm   LV e' lateral:   7.07 cm/s LVOT diam:     1.70 cm   LV E/e' lateral: 18.1 LVOT Area:     2.27 cm  RIGHT VENTRICLE RV S prime:     5.34 cm/s LEFT ATRIUM           Index LA diam:      3.00 cm 1.47 cm/m LA Vol (A2C): 48.1 ml 23.52 ml/m LA Vol (A4C): 40.9 ml 20.00 ml/m   AORTA Ao Root diam: 2.60 cm MITRAL VALVE MV Area (PHT): 4.39 cm     SHUNTS MV Decel Time: 173 msec     Systemic  Diam: 1.70 cm MV E velocity: 128.00 cm/s MV A velocity: 162.00 cm/s MV E/A ratio:  0.79 Vinie Maxcy MD Electronically signed by Vinie Maxcy MD Signature Date/Time: 10/20/2023/6:31:01 PM    Final    VAS US  LOWER EXTREMITY VENOUS (DVT) Result Date: 10/20/2023  Lower Venous DVT Study Patient Name:  Andrea QUALLS Robenson  Date of Exam:   10/20/2023 Medical Rec #: [id number]         Accession #:    [id number] Date of Birth: [birth year]         Patient Gender: F Patient Age:   16 years Exam Location:  [facility] Procedure:      VAS US  LOWER EXTREMITY VENOUS (DVT) Referring Phys: MAXIMINO SHARPS --------------------------------------------------------------------------------  Indications: Pulmonary embolism.  Performing Technologist: Elmarie Lindau, RVT  Examination Guidelines: A complete evaluation includes B-mode imaging, spectral Doppler, color Doppler, and power Doppler as needed of all accessible portions of each vessel. Bilateral testing is considered an integral part of a complete examination. Limited examinations for reoccurring indications may be performed as noted. The reflux portion of the exam is performed with the patient in reverse Trendelenburg.  +---------+---------------+---------+-----------+----------+--------------+ RIGHT    CompressibilityPhasicitySpontaneityPropertiesThrombus Aging +---------+---------------+---------+-----------+----------+--------------+ CFV      Full           Yes      Yes                                 +---------+---------------+---------+-----------+----------+--------------+ SFJ      Full                                                        +---------+---------------+---------+-----------+----------+--------------+ FV Prox  Full                                                        +---------+---------------+---------+-----------+----------+--------------+ FV Mid   Full                                                         +---------+---------------+---------+-----------+----------+--------------+ FV DistalFull                                                        +---------+---------------+---------+-----------+----------+--------------+ PFV      Full                                                        +---------+---------------+---------+-----------+----------+--------------+ POP      Partial                                      Chronic        +---------+---------------+---------+-----------+----------+--------------+ PTV      Full                                                        +---------+---------------+---------+-----------+----------+--------------+  PERO     Full                                                        +---------+---------------+---------+-----------+----------+--------------+   +---------+---------------+---------+-----------+----------+--------------+ LEFT     CompressibilityPhasicitySpontaneityPropertiesThrombus Aging +---------+---------------+---------+-----------+----------+--------------+ CFV      Full           Yes      Yes                                 +---------+---------------+---------+-----------+----------+--------------+ SFJ      Full                                                        +---------+---------------+---------+-----------+----------+--------------+ FV Prox  Full                                                        +---------+---------------+---------+-----------+----------+--------------+ FV Mid   Full                                                        +---------+---------------+---------+-----------+----------+--------------+ FV DistalFull                                                        +---------+---------------+---------+-----------+----------+--------------+ PFV      Full                                                         +---------+---------------+---------+-----------+----------+--------------+ POP      Partial                                      Chronic        +---------+---------------+---------+-----------+----------+--------------+ PTV      Full                                                        +---------+---------------+---------+-----------+----------+--------------+ PERO     Full                                                        +---------+---------------+---------+-----------+----------+--------------+  Summary: RIGHT: - Findings consistent 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  - No cystic structure found in the popliteal fossa.  LEFT: - Findings consistent with chronic deep vein thrombosis involving the left popliteal vein.  - No cystic structure found in the popliteal fossa.  *See table(s) above for measurements and observations. Electronically signed by Lonni Gaskins MD on 10/20/2023 at 2:53:16 PM.    Final    MR Brain W and Wo Contrast Result Date: 10/19/2023 CLINICAL DATA:  Initial evaluation for dural venous sinus thrombosis, acute neuro deficit. EXAM: MRI HEAD WITHOUT AND WITH CONTRAST TECHNIQUE: Multiplanar, multiecho pulse sequences of the brain and surrounding structures were obtained without and with intravenous contrast. CONTRAST:  10mL GADAVIST  GADOBUTROL  1 MMOL/ML IV SOLN COMPARISON:  Comparison made with CT from earlier the same day as well as multiple previous exams. FINDINGS: Brain: Diffuse prominence of the CSF containing spaces compatible generalized cerebral atrophy. Patchy and confluent T2/FLAIR hyperintensity involving the periventricular deep white matter both cerebral hemispheres, consistent with chronic microvascular ischemic disease, moderately advanced in nature. Mild patchy involvement of the pons and cerebellum noted. Few remote lacunar infarcts noted about the bilateral basal ganglia. Remote right pontine infarct with a  few scattered small remote bilateral cerebellar infarcts. 6 mm acute ischemic nonhemorrhagic infarcts seen involving the right splenium (series 5, image 75). No other evidence for acute or subacute ischemia. Gray-white matter differentiation otherwise maintained. No acute intracranial hemorrhage. Multiple scattered chronic micro hemorrhages noted, likely hypertensive in nature. No mass lesion, midline shift, or mass effect. Mild ventricular prominence related to global parenchymal volume loss of hydrocephalus. No extra-axial fluid collection. Pituitary gland and suprasellar region within normal limits. There is an apparent 6 mm focus of enhancement involving the right pons (series 17, image 14). This is at the location of an underlying right pontine infarct as well as a few prominent chronic micro hemorrhages. Given this, finding is suspected to reflect enhancement related to a small of all vein subacute infarct or possibly artifact related to the adjacent microhemorrhage. No other abnormal enhancement elsewhere within the brain. Vascular: Major intracranial arterial vascular flow voids are maintained. Previously identified dural venous sinus thrombosis involving the right transverse and sigmoid sinuses with extension into the right jugular bulb and visualized proximal right internal jugular vein, corresponding with findings on recent exams. Overall, appearance is relatively similar. Skull and upper cervical spine: Craniocervical junction within normal limits. Heterogeneous and mildly decreased T1 signal intensity within the visualized bone marrow, nonspecific, but most commonly related to anemia, smoking or obesity. No scalp soft tissue abnormality. Sinuses/Orbits: Globes orbital soft tissues within normal limits. Mild chronic mucosal thickening noted about the paranasal sinuses. Moderate to large right mastoid and middle ear effusion. Trace left mastoid effusion noted. Other: None. IMPRESSION: 1. 6 mm acute  ischemic nonhemorrhagic infarct involving the right splenium. 2. 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 Overall, appearance is relatively stable from prior. 3. 6 mm focus of enhancement involving the right pons, favored to reflect enhancement related to a small subacute infarct or possibly artifact related to the adjacent microhemorrhage. Short interval follow-up MRI in 3 months suggested to ensure resolution and/or stability. 4. Underlying age-related cerebral atrophy with moderately advanced chronic microvascular ischemic disease, with a few scattered remote lacunar infarcts involving the bilateral basal ganglia, right pons, and bilateral cerebellar hemispheres. 5. Multiple scattered  chronic micro hemorrhages, likely hypertensive in nature. Electronically Signed   By: Morene Hoard M.D.   On: 10/19/2023 23:23   CT VENOGRAM HEAD Result Date: 10/19/2023 CLINICAL DATA:  58 year old female status post fall. Pain. Prior stroke. Left side weakness and numbness. History of lung cancer. Head and neck CT CT with contrast yesterday demonstrating central venous thrombosis, right IJ thrombosis involving the right transverse and sigmoid sinus also. EXAM: CT VENOGRAM HEAD TECHNIQUE: Venographic phase images of the brain were obtained following the administration of intravenous contrast. Multiplanar reformats and maximum intensity projections were generated. RADIATION DOSE REDUCTION: This exam was performed according to the departmental dose-optimization program which includes automated exposure control, adjustment of the mA and/or kV according to patient size and/or use of iterative reconstruction technique. CONTRAST:  75mL OMNIPAQUE  IOHEXOL  300 MG/ML  SOLN COMPARISON:  Head and neck CT yesterday.  Brain MRI 11/09/2021. FINDINGS: CT HEAD Post-contrast only. Stable cerebral  volume. Chronic lacunar infarcts in the bilateral brain. No evidence of intracranial mass effect, acute ventriculomegaly, acute intracranial hemorrhage, acute cerebral edema. CTV HEAD Venous sinuses: Maintained enhancement and patency of the superior sagittal sinus, torcula, straight sinus, inferior sagittal sinus, internal cerebral veins. Maintained enhancement and patency of the left transverse, left sigmoid venous sinus, and visible left IJ bulb. Cavernous sinus enhancement appears symmetric and within normal limits also. Nonenhancing, thrombosed right IJ bulb, right sigmoid venous sinus, and distal half of the right transverse sinus (beginning on series 2, image 25. The medial right transverse sinus remains enhancing. The appearance is unchanged from head and neck CT yesterday. Anatomic variants: None significant. Other findings: Calcified atherosclerosis at the skull base. Review of the MIP images confirms the above findings IMPRESSION: 1. Stable Thrombosis of the right IJ, right sigmoid sinus, and lateral half of the right transverse dural venous sinus since the CT Head and Neck yesterday. 2. No other intracranial venous thrombosis identified. No intracranial mass effect, hemorrhage, or acute cerebral edema identified. Electronically Signed   By: VEAR Hurst M.D.   On: 10/19/2023 08:20   CT Soft Tissue Neck W Contrast Result Date: 10/18/2023 CLINICAL DATA:  Initial evaluation for acute facial swelling. EXAM: CT NECK WITH CONTRAST TECHNIQUE: Multidetector CT imaging of the neck was performed using the standard protocol following the bolus administration of intravenous contrast. RADIATION DOSE REDUCTION: This exam was performed according to the departmental dose-optimization program which includes automated exposure control, adjustment of the mA and/or kV according to patient size and/or use of iterative reconstruction technique. CONTRAST:  OMNIPAQUE  IOHEXOL  350 MG/ML SOLN COMPARISON:  None Available.  FINDINGS: Pharynx and larynx: Oral cavity within normal limits. Oropharynx and nasopharynx within normal limits. No retropharyngeal collection or swelling. Negative epiglottis. Hypopharynx and supraglottic larynx within normal limits. Negative glottis. Subglottic airway clear. Salivary glands: Salivary glands including the parotid and submandibular glands are within normal limits. Thyroid : Normal. Lymph nodes: No enlarged or pathologic lymph nodes within the neck. Vascular: Occlusive thrombus seen involving the brachiocephalic vein, extending into the visualized SVC. The vessels are expanded with hazy inflammatory stranding, consistent with acute thrombus. Clot extends to partially involve the right greater than left subclavian veins as well as the right axillary vein. Cephalad extension with subocclusive thrombus extending throughout the right internal jugular vein, with extension into the cranial vault to involve the right transverse and sigmoid sinuses. Left IJ remains patent. Multiple prominent venous collaterals noted within the right neck and visualized right chest wall. Normal arterial enhancement seen throughout  the neck. Aortic atherosclerosis noted. Limited intracranial: No acute finding. Visualized orbits: No acute finding. Mastoids and visualized paranasal sinuses: Mild mucoperiosteal thickening about the ethmoidal air cells and maxillary sinuses. Right mastoid and middle ear effusion. Left mastoid air cells are clear. Skeleton: No worrisome osseous lesions. Upper chest: Approximate 5 cm thick walled cavitary mass at the posterior right upper lobe, partially visualize, better evaluated on concomitant CT of the chest. Other: None. IMPRESSION: 1. Acute occlusive and subocclusive thrombus involving the brachiocephalic vein, extending into the visualized SVC. Clot extends to partially involve the right greater than left subclavian veins as well as the right axillary vein. Cephalad extension with subocclusive  thrombus extending throughout the right internal jugular vein, with extension into the cranial vault to involve the right transverse and sigmoid sinuses. 2. Approximate 5 cm thick walled cavitary mass at the posterior right upper lobe, partially visualized, better evaluated on concomitant CT of the chest. 3. Right mastoid and middle ear effusion, of uncertain significance. Correlation with physical exam recommended. 4.  Aortic Atherosclerosis (ICD10-I70.0). Electronically Signed   By: Morene Hoard M.D.   On: 10/18/2023 19:21   CT Head W or Wo Contrast Result Date: 10/18/2023 CLINICAL DATA:  Initial evaluation for acute headache, history of malignancy. EXAM: CT HEAD WITHOUT AND WITH CONTRAST TECHNIQUE: Contiguous axial images were obtained from the base of the skull through the vertex without and with intravenous contrast. RADIATION DOSE REDUCTION: This exam was performed according to the departmental dose-optimization program which includes automated exposure control, adjustment of the mA and/or kV according to patient size and/or use of iterative reconstruction technique. CONTRAST:  OMNIPAQUE  IOHEXOL  350 MG/ML SOLN COMPARISON:  Prior CT from 09/20/2023 FINDINGS: Brain: Generalized age-related cerebral atrophy. Patchy and confluent hypodensity involving the supratentorial cerebral white matter, consistent with chronic small vessel ischemic disease, moderate in nature. Multiple scattered remote lacunar infarcts noted about the bilateral basal ganglia right thalamus, stable. No acute intracranial hemorrhage. No acute large vessel territory infarct. No mass lesion or midline shift. No hydrocephalus or extra-axial fluid collection. Vascular: No abnormal hyperdense vessel seen prior to contrast administration. Calcified atherosclerosis present at the skull base. Following contrast administration, filling defect within the right transverse and sigmoid sinuses extending into the right jugular bulb and  visualized proximal right internal jugular vein, consistent with dural venous sinus thrombosis. This appears to extend inferiorly throughout the right IJ within the neck. Skull: Scalp soft tissues demonstrate no acute finding. Calvarium intact. Sinuses/Orbits: Globes orbital soft tissues within normal limits. Mild mucoperiosteal thickening present about the paranasal sinuses. Moderate right mastoid and middle ear effusion. Left mastoid air cells are clear. Other: None. IMPRESSION: 1. Filling defect within the right transverse and sigmoid sinuses extending into the right jugular bulb and visualized proximal right internal jugular vein, consistent with dural venous sinus thrombosis. This extends inferiorly throughout the right IJ within the neck. 2. No other acute intracranial abnormality. 3. Moderate right mastoid and middle ear effusion, of uncertain significance. Correlation with physical exam recommended. 4. Underlying age-related cerebral atrophy with moderate chronic small vessel ischemic disease, with multiple remote lacunar infarcts about the bilateral basal ganglia and right thalamus. Electronically Signed   By: Morene Hoard M.D.   On: 10/18/2023 19:11   CT Angio Chest PE W and/or Wo Contrast Addendum Date: 10/18/2023 ADDENDUM REPORT: 10/18/2023 18:55 ADDENDUM: Results were discussed with Dr. Dreama at 6:41 p.m. [place] on October 18, 2023. Electronically Signed   By: Suzen Dwane HERO.D.  On: 10/18/2023 18:55   Result Date: 10/18/2023 CLINICAL DATA:  History of lung cancer presenting with chest tightness. EXAM: CT ANGIOGRAPHY CHEST WITH CONTRAST TECHNIQUE: Multidetector CT imaging of the chest was performed using the standard protocol during bolus administration of intravenous contrast. Multiplanar CT image reconstructions and MIPs were obtained to evaluate the vascular anatomy. RADIATION DOSE REDUCTION: This exam was performed according to the departmental dose-optimization program which  includes automated exposure control, adjustment of the mA and/or kV according to patient size and/or use of iterative reconstruction technique. CONTRAST:  OMNIPAQUE  IOHEXOL  350 MG/ML SOLN COMPARISON:  October 14, 2023 FINDINGS: Cardiovascular: There is marked severity calcification of the aortic arch without evidence of aortic aneurysm or dissection. The segmental and subsegmental pulmonary arteries are limited in evaluation secondary to suboptimal opacification with intravenous contrast. As result, pulmonary embolism within the segmental branches of the right lower lobe cannot be excluded (axial CT images 132 through 142, CT series 6). Occlusive thrombus is noted within the brachiocephalic vein. This is present on the prior study. Nonocclusive thrombus is seen within the superior vena cava (axial CT images 39 through 45, CT series 5). Nonocclusive thrombus is also noted within the azygous vein (axial CT images 44 through 78, CT series 5). A partially thrombosed venous structure is seen within the anteromedial aspect of the upper right lung (axial CT image 44 through 53, CT series 5). Extensive, nonocclusive thrombus is noted within the right axillary vein and right subclavian vein (axial CT images 5 through 52, CT series 5) Numerous thin, tortuous, enhancing venous structures are seen within the anterior and posterior aspects of the chest wall on the right. Normal heart size with mild to moderate severity coronary artery calcification and marked severity calcification of the mitral annulus. No pericardial effusion. Mediastinum/Nodes: There is mild right hilar lymphadenopathy. Thyroid  gland, trachea, and esophagus demonstrate no significant findings. Lungs/Pleura: A 4.4 cm x 4.8 cm x 4.6 cm thick walled cavitary lesion is again seen within the posterior aspect of the right upper lobe. Mild anteromedial right middle lobe linear atelectasis is noted. No pleural effusion or pneumothorax identified. Upper  Abdomen: There is a small hiatal hernia. Diffuse fatty infiltration of the liver parenchyma is noted. Noninflamed diverticula are seen throughout the visualized portions of the large bowel. Musculoskeletal: No chest wall abnormality. No acute or significant osseous findings. Review of the MIP images confirms the above findings. IMPRESSION: 1. Limited evaluation of the segmental and subsegmental pulmonary arteries secondary to suboptimal opacification with intravenous contrast. As result, pulmonary embolism within the segmental branches of the right lower lobe cannot be excluded. 2. Occlusive thrombus within the brachiocephalic vein, nonocclusive thrombus within the superior vena cava and azygous vein, and extensive, nonocclusive thrombus within the right axillary vein and right subclavian vein. 3. 4.4 cm x 4.8 cm x 4.6 cm thick walled cavitary lesion within the posterior aspect of the right upper lobe which is seen on the prior study and may be consistent with the patient's history of lung cancer. 4. Mild right hilar lymphadenopathy. 5. Small hiatal hernia. 6. Hepatic steatosis. 7. Colonic diverticulosis. 8. Aortic atherosclerosis. Electronically Signed: By: Suzen Dials M.D. On: 10/18/2023 18:52   DG Chest 2 View Result Date: 10/18/2023 CLINICAL DATA:  Chest pain EXAM: CHEST - 2 VIEW COMPARISON:  Chest radiograph October 14, 2023, CT angio chest October 14, 2023 FINDINGS: Thick-walled cavitary lesion with air-fluid level in right upper lobe, measuring approximately 5 cm. No new lesion or consolidation identified. Cardiomediastinal  silhouette is enlarged. Fullness of right paratracheal correlating to mediastinal lymphadenopathy. No pleural effusion or pneumothorax. Multilevel degenerative changes of the spine. Nodular opacity superimposing lower thoracic spine on lateral view correlates to osteophytes better assessed on recent CT angio chest. IMPRESSION: Thick-walled cavitary lesion of right upper lobe.  Right lower paratracheal mediastinal lymphadenopathy. Electronically Signed   By: Megan  Zare M.D.   On: 10/18/2023 14:34

## 2023-11-16 DIAGNOSIS — R531 Weakness: Secondary | ICD-10-CM | POA: Diagnosis not present

## 2023-11-16 LAB — BASIC METABOLIC PANEL WITH GFR
Anion gap: 11 (ref 5–15)
BUN: 13 mg/dL (ref 6–20)
CO2: 23 mmol/L (ref 22–32)
Calcium: 9.7 mg/dL (ref 8.9–10.3)
Chloride: 101 mmol/L (ref 98–111)
Creatinine, Ser: 0.62 mg/dL (ref 0.44–1.00)
GFR, Estimated: >60 mL/min (ref >60)
Glucose, Bld: 93 mg/dL (ref 70–99)
Potassium: 4.2 mmol/L (ref 3.5–5.1)
Sodium: 134 mmol/L — ABNORMAL LOW (ref 135–145)

## 2023-11-16 LAB — CBC WITH DIFFERENTIAL/PLATELET
Abs Immature Granulocytes: 0.01 K/uL (ref 0.00–0.07)
Basophils Absolute: 0 K/uL (ref 0.0–0.1)
Basophils Relative: 1 %
Eosinophils Absolute: 0.1 K/uL (ref 0.0–0.5)
Eosinophils Relative: 3 %
HCT: 33 % — ABNORMAL LOW (ref 36.0–46.0)
Hemoglobin: 10.1 g/dL — ABNORMAL LOW (ref 12.0–15.0)
Immature Granulocytes: 0 %
Lymphocytes Relative: 17 %
Lymphs Abs: 0.7 K/uL (ref 0.7–4.0)
MCH: 27.2 pg (ref 26.0–34.0)
MCHC: 30.6 g/dL (ref 30.0–36.0)
MCV: 88.7 fL (ref 80.0–100.0)
Monocytes Absolute: 0.4 K/uL (ref 0.1–1.0)
Monocytes Relative: 11 %
Neutro Abs: 2.7 K/uL (ref 1.7–7.7)
Neutrophils Relative %: 68 %
Platelets: 183 K/uL (ref 150–400)
RBC: 3.72 MIL/uL — ABNORMAL LOW (ref 3.87–5.11)
RDW: 14.8 % (ref 11.5–15.5)
WBC: 3.9 K/uL — ABNORMAL LOW (ref 4.0–10.5)
nRBC: 0 % (ref 0.0–0.2)

## 2023-11-16 LAB — MAGNESIUM: Magnesium: 2.1 mg/dL (ref 1.7–2.4)

## 2023-11-16 MED ORDER — METRONIDAZOLE 500 MG PO TABS
500.0000 mg | ORAL_TABLET | Freq: Two times a day (BID) | ORAL | Status: DC
  Administered 2023-11-16 – 2023-11-18 (×5): 500 mg via ORAL
  Filled 2023-11-16 (×5): qty 1

## 2023-11-16 MED ORDER — CEFADROXIL 500 MG PO CAPS
500.0000 mg | ORAL_CAPSULE | Freq: Two times a day (BID) | ORAL | Status: DC
Start: 2023-11-16 — End: 2023-11-19
  Administered 2023-11-16 – 2023-11-18 (×4): 500 mg via ORAL
  Filled 2023-11-16 (×5): qty 1

## 2023-11-16 NOTE — Progress Notes (Signed)
 Pt recommended for SNF. Pt and family accepting of SNF. PASRR obtained and FL2 completed. SNF ref faxed, pending bed offers.

## 2023-11-16 NOTE — Plan of Care (Signed)

## 2023-11-16 NOTE — Progress Notes (Signed)
 This morning I  touched base with med onc, inpatient hospitalist, and our thoracic navigator on this patient: The consensus is that focus of her admission should be on stabilizing her medically, and that she follow-up with Dr. Sherrod and me as an outpatient after her PET scan can be performed.  This will be imperative to allow adequate staging of her disease and accurate targeting of her cancer with chemo radiotherapy.  Our thoracic navigator will communicate this plan with the patient and her daughter.   There was some misunderstanding among the hospitalist team that she had already started radiation.  To clarify, radiation has not been initiated and will need to be held until PET staging can be performed and the patient follows up with me as an outpatient for treatment planning.  -----------------------------------  Lauraine Golden, MD

## 2023-11-16 NOTE — Progress Notes (Signed)
 Physical Therapy Treatment Patient Details Name: Andrea Mullen MRN: [id number] DOB: June 09, 1965 Today's Date: 11/16/2023   History of Present Illness Andrea Mullen is a 58 y.o. female admitted 11/11/23 with generalized weakness and frequent falls. 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 Neurology and vascular surgery were consulted. She was also found to have right sided cavitary lesion and underwent bronchoscopy.  also had lymph node aspiration which was consistent with non small cell carcinoma, consistent with squamous cell carcinoma. She was transitioned to eliquis . 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 Nursing reported Pt was OOB in recliner earlier via STEDY. Pt lives home with daughter who was at bedside.  Assisted to EOB was difficult.  Pt requires physical assist to initiate movement to  EOB, cues for lateral scooting.  Max A for trunk elevation and use of bed pad to facilitate hips fully to  EOB.  Poor sitting balance with severe posterior lean and no self correction/awarness. Even greater assist back to bed sit to supine then scoot to [facility] required Total Assist +2. General transfer comment: Pt was unable to perform a traditional sit to stand with walker due to extreme [name] and lack of controlled initiation.  From VERY elevated bed, Daughter stood in front of her Mom and pulled her up holding B hands with [name] hands.  Pt was able to rise but unable to move past static standing.  Max VC's to step and Max tactile cueing to push Pt in the right direction.  Feet remained planted.  Was attempted to  get Pt to take a few side steps to [facility] but since she showed NO ability to move, had to reposition bed such that it was lined up with [name] hips.  With stand to sit, again lack of initiation required Max Tactile Cueing to get Pt to sit EOB which once achieved exhibited uncontrol plop and posterior seated LOB Therapist recovered.  Then, Required Total Assist + 2 to transfer from seated EOB to supine.  Pt offered 0%.  RN arrived to perform EKG.  LPT has rec Pt will need ST Rehab at SNF to address mobility and functional decline prior to safely returning home.     If plan is discharge home, recommend the following: Help with stairs or ramp for entrance;Assist for transportation;Assistance with cooking/housework;Supervision due to cognitive status;A lot of help with walking and/or transfers;A lot of help with bathing/dressing/bathroom;Direct supervision/assist for medications management;Direct supervision/assist for financial management   Can travel by private vehicle     No  Equipment Recommendations  Wheelchair (measurements PT);Other (comment)    Recommendations for Other Services       Precautions / Restrictions Precautions Precautions: Fall Recall of Precautions/Restrictions: Impaired Precaution/Restrictions Comments: Hx ischemic stroke, Blind L eye Restrictions Weight Bearing Restrictions Per Provider Order: No     Mobility  Bed Mobility Overal bed mobility: Needs Assistance Bed Mobility: Supine to Sit, Sit to Supine     Supine to sit: Max assist, Total assist, +2 for physical assistance, +2 for safety/equipment Sit to supine: Total assist, +2 for physical assistance, +2 for safety/equipment  General bed mobility comments: pt requires physical assist to initiate movement to  EOB, cues for lateral scooting.  Max A for trunk elevation and use of bed pad to facilitate hips fully to  EOB.  Poor sitting balance with severe posterior lean and no self correction/awarness. Even greater  assist back to bed sit to supine then scoot to [facility] required Total Assist +2.    Transfers Overall transfer level: Needs assistance Equipment used: Rolling walker (2 wheels) Transfers: Sit to/from Stand Sit to Stand: Mod assist, +2 physical assistance, +2 safety/equipment           General transfer comment: Pt was unable to perform a traditional sit to stand with walker due to extreme [name] and lack of controlled initiation.  From VERY elevated bed, Daughter stood in front of her Mom and pulled her up holding B hands with [name] hands.  Pt was able to rise but unable to move past static standing.  Max VC's to step and Max tactile cueing to push Pt in the right direction.  Feet remained planted.  Was attempted to get Pt to take a few side steps to [facility] but since she showed NO ability to move, had to reposition bed such that it was lined up with [name] hips.  With stand to sit, again lack of initiation required Max Tactile Cueing to get Pt to sit EOB which once achieved exhibited uncontrol plop and posterior seated LOB Therapist recovered.  Then, Required Total Assist + 2 to transfer from seated EOB to supine.  Pt offered 0%.  RN arrived to perform EKG.    Ambulation/Gait               General Gait Details: Unable to attempt due to [name] inability to initiate/attempt any stepping pattern.   Stairs             Wheelchair Mobility     Tilt Bed    Modified Rankin (Stroke Patients Only)       Balance                                            Communication Communication Communication: Impaired Factors Affecting Communication: Difficulty expressing self;Reduced clarity of speech  Cognition Arousal: Alert, Lethargic Behavior During Therapy: Flat affect   PT - Cognitive impairments: Initiation, Sequencing, Problem solving                       PT - Cognition Comments: Pt awake, able to express self and engages in good eye contact  but exhibits poor safety awareness, poor cognition and delayed verbal expression as well as movments. Following commands: Impaired      Cueing Cueing Techniques: Verbal cues, Tactile cues, Gestural cues  Exercises      General Comments        Pertinent Vitals/Pain Pain Assessment Pain Assessment: No/denies pain    Home Living                          Prior Function            PT Goals (current goals can now be found in the care plan section) Progress towards PT goals: Progressing toward goals    Frequency    Min 2X/week      PT Plan  Co-evaluation              AM-PAC PT 6 Clicks Mobility   Outcome Measure  Help needed turning from your back to your side while in a flat bed without using bedrails?: Total Help needed moving from lying on your back to sitting on the side of a flat bed without using bedrails?: Total Help needed moving to and from a bed to a chair (including a wheelchair)?: Total Help needed standing up from a chair using your arms (e.g., wheelchair or bedside chair)?: Total Help needed to walk in hospital room?: Total Help needed climbing 3-5 steps with a railing? : Total 6 Click Score: 6    End of Session Equipment Utilized During Treatment: Gait belt   Patient left: in bed;with call bell/phone within reach;with family/visitor present Nurse Communication: Mobility status PT Visit Diagnosis: Unsteadiness on feet (R26.81);Other abnormalities of gait and mobility (R26.89);Muscle weakness (generalized) (M62.81)     Time: 8481-8458 PT Time Calculation (min) (ACUTE ONLY): 23 min  Charges:    $Therapeutic Activity: 23-37 mins PT General Charges $$ ACUTE PT VISIT: 1 Visit                    Katheryn Leap  PTA Acute  Rehabilitation Services Office M-F          [phone]

## 2023-11-16 NOTE — [place] FL2 (Signed)
 [place]  MEDICAID FL2 LEVEL OF CARE FORM     IDENTIFICATION  Patient Name: Andrea Mullen Birthdate: 11/04/1965 Sex: female Admission Date (Current Location): 11/11/2023  [facility] and [place] Number:  Producer, television/film/video and Address:  [facility],  [address], [place] [address]      Provider Number: [id number]  Attending Physician Name and Address:  Cheryle Page, MD  Relative Name and Phone Number:  Kathlene Remak (Daughter)  [phone]    Current Level of Care: Hospital Recommended Level of Care: Skilled Nursing Facility Prior Approval Number:    Date Approved/Denied:   PASRR Number: [id number]  Discharge Plan: SNF    Current Diagnoses: Patient Active Problem List   Diagnosis Date Noted   History of CVA (cerebrovascular accident) 11/12/2023   Normocytic anemia 11/12/2023   Primary cancer of right upper lobe of lung (HCC) 11/12/2023   Weakness and falls 11/11/2023   Non-small cell lung cancer (HCC) 11/03/2023   Goals of care, counseling/discussion 11/03/2023   Acute embolism and thrombosis of right subclavian vein (HCC) 10/25/2023   Acute respiratory distress 10/19/2023   head and neck thrombosis 10/19/2023   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   Venous thrombosis 10/19/2023   Acute thrombosis of right axillary vein (HCC) 10/19/2023   Subclavian vein thrombosis (HCC) 10/19/2023   History of lung cancer 10/19/2023   Left leg weakness 10/19/2023   Mediastinal lymphadenopathy 10/19/2023   Cavitary lesion of lung 10/19/2023   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   Aortic stenosis 11/10/2021   Stroke (HCC) 11/08/2021   CAD (coronary artery disease) 11/08/2021   Microcytic anemia 11/08/2021   Asthma, chronic 11/08/2021   HTN (hypertension) 11/08/2021   HLD (hyperlipidemia) 11/08/2021   Elevated troponin    NSTEMI (non-ST elevated myocardial infarction) (HCC)    Polysubstance abuse (HCC)    Anxiety state    Atypical  chest pain 10/30/2014   Smoking 10/30/2014   Chest pain 10/30/2014   Homicidal ideation    MALIGNANT NEOPLASM BRONCHUS&LUNG UNSPEC SITE 07/11/2007   Morbid obesity (HCC) 07/11/2007   TOBACCO ABUSE-HISTORY OF 07/11/2007   Cavitating mass in right upper lung lobe 06/07/2007    Orientation RESPIRATION BLADDER Height & Weight     Self, Time, Situation, Place  Normal Incontinent Weight: 175 lb (79.4 kg) Height:  5' 5 (165.1 cm)  BEHAVIORAL SYMPTOMS/MOOD NEUROLOGICAL BOWEL NUTRITION STATUS      Continent Diet (see dc summary)  AMBULATORY STATUS COMMUNICATION OF NEEDS Skin   Limited Assist Verbally Normal                       Personal Care Assistance Level of Assistance  Bathing, Dressing, Feeding Bathing Assistance: Limited assistance Feeding assistance: Limited assistance Dressing Assistance: Limited assistance     Functional Limitations Info  Speech, Sight, Hearing Sight Info: Impaired Hearing Info: Adequate Speech Info: Adequate    SPECIAL CARE FACTORS FREQUENCY  PT (By licensed PT), OT (By licensed OT)     PT Frequency: 5x/wk OT Frequency: 5x/wk            Contractures Contractures Info: Not present    Additional Factors Info  Code Status, Allergies Code Status Info: Full Code Allergies Info: Bicillin C-r  Penicillins  Nsaids  Prednisone            Current Medications (11/16/2023):  This is the current hospital active medication list Current Facility-Administered Medications  Medication Dose Route Frequency Provider Last Rate Last Admin  acetaminophen  (TYLENOL ) tablet 650 mg  650 mg Oral Q6H PRN Waddell Rake, MD       Or   acetaminophen  (TYLENOL ) suppository 650 mg  650 mg Rectal Q6H PRN Waddell Rake, MD       albuterol  (PROVENTIL ) (2.5 MG/3ML) 0.083% nebulizer solution 2.5 mg  2.5 mg Nebulization Q6H PRN Waddell Rake, MD       apixaban  (ELIQUIS ) tablet 5 mg  5 mg Oral BID Waddell Rake, MD   5 mg at 11/16/23 0847   cefadroxil (DURICEF)  capsule 500 mg  500 mg Oral BID Cheryle Page, MD   500 mg at 11/16/23 1633   Gerhardt's butt cream   Topical TID Cheryle Page, MD   Given at 11/16/23 1634   HYDROcodone -acetaminophen  (NORCO/VICODIN) 5-325 MG per tablet 1 tablet  1 tablet Oral Q6H PRN Waddell Rake, MD   1 tablet at 11/15/23 2127   melatonin tablet 5 mg  5 mg Oral QHS PRN Daniels, James K, NP   5 mg at 11/15/23 2127   metroNIDAZOLE  (FLAGYL ) tablet 500 mg  500 mg Oral Q12H Alekh, Kshitiz, MD   500 mg at 11/16/23 1142   ondansetron  (ZOFRAN ) tablet 4 mg  4 mg Oral Q6H PRN Waddell Rake, MD   4 mg at 11/15/23 9172   Or   ondansetron  (ZOFRAN ) injection 4 mg  4 mg Intravenous Q6H PRN Waddell Rake, MD       Oral care mouth rinse  15 mL Mouth Rinse PRN Cheryle Page, MD       rosuvastatin  (CRESTOR ) tablet 40 mg  40 mg Oral QHS Cheryle Page, MD   40 mg at 11/15/23 2127     Discharge Medications: Please see discharge summary for a list of discharge medications.  Relevant Imaging Results:  Relevant Lab Results:   Additional Information SSN [id number]  Sheri ONEIDA Sharps, LCSW

## 2023-11-16 NOTE — Plan of Care (Signed)
   Problem: Nutrition: Goal: Adequate nutrition will be maintained Outcome: Progressing   Problem: Elimination: Goal: Will not experience complications related to bowel motility Outcome: Progressing   Problem: Safety: Goal: Ability to remain free from injury will improve Outcome: Progressing   Problem: Skin Integrity: Goal: Risk for impaired skin integrity will decrease Outcome: Progressing

## 2023-11-16 NOTE — Progress Notes (Addendum)
 PROGRESS NOTE    Andrea Mullen  FMW:[id number] DOB: [birth year] DOA: 11/11/2023 PCP: [facility]   Brief Narrative:  58 y.o. 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  She presented with weakness and falls.  On presentation, hemoglobin was 12.7.  CT head showed no acute intracranial abnormality.  CT cervical spine showed no fractures.  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  She was started on IV antibiotics.  Oncology team has involved IR who is planning for possible initiation of radiation next week.  PT recommended SNF placement.  Assessment & Plan:   Weakness and falls - Possibly in the setting of newly diagnosed lung cancer and SVC thrombosis along with poor oral intake, depression,?  Infection of the chest and moderate to severe aortic stenosis -Continue telemetry monitoring.  PT/OT recommends SNF placement.  Consulted TOC. - Fall precautions  Cavitary mass in the right upper lobe with concern for possible infection - Imaging revealed cavitary lung mass filled with fluid, cannot rule out infection.  I  reviewed the images with Dr. Delcie on 11/13/2023 who recommended antibiotic treatment .  Currently on vancomycin and cefepime.  Will switch to oral cefadroxil and Flagyl .  Might need longer course of antibiotics.  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  Oncology was planning to start chemoradiation from this week which has been held because of hospitalization.  Oncology follow-up appreciated.  PET scan pending as an outpatient. - radiation oncology was planning to start radiation on 11/15/2023 but this has not been started yet.  Head and neck thrombosis -During recent hospitalization in 10/2023 found to have acute thrombus involving SVC, brachiocephalic vein, azygous vein, right axillary vein and right subclavian vein. Also dural sinus thrombosis -repeat imaging continues to show above findings  -continue eliquis    History of acute ischemic stroke involving the right splenium and right pons: Per neurology, the etiology was likely small vessel disease Hyperlipidemia -Continue high-intensity statin and Eliquis .  Outpatient follow-up with neurology.  PT following  Moderate to severe aortic stenosis - Will need outpatient cardiology evaluation.  I communicated with cardiology on-call/Dr. Azobou Tonleu on 11/13/2023 via secure chat: He reviewed the echo and thought that patient has moderate aortic stenosis and can be safely followed as an outpatient with cardiology.  CAD Hypertension - Continue medical management.  Currently not on any antihypertensive regimen.  Hyponatremia - Mild.  Monitor  Leukopenia - Questionable cause.  Monitor intermittently.  Anemia of chronic disease -From chronic illnesses.  Hemoglobin stable will monitor intermittently  History of tobacco abuse  and cocaine  abuse--apparently, last use of  cocaine  was 2 years ago.  Has not smoked in a month.   DVT prophylaxis: Eliquis  Code Status: Full Family Communication: Daughter at bedside Disposition Plan: Status is: inpatient because: Of severity of illness.    Consultants: IR.  Oncology/radiation oncology  Procedures: None  Antimicrobials: Cefepime and vancomycin from 11/12/2023 onwards   Subjective: Patient seen and examined at bedside.  Poor historian.  Continues to remain weak with poor oral intake.  No fever, seizures, vomiting or agitation reported.   Objective: Vitals:   11/15/23 1042 11/15/23 1206 11/15/23 2029 11/16/23 0517  BP:  96/68 114/79 110/74  Pulse:  94  (!) 101  Resp: 11 18 18 18   Temp:  98.4 F (36.9 C) 99.4 F (37.4 C) 99.2 F (37.3 C)  TempSrc:  Oral Oral Oral  SpO2:  98% 100% 98%  Weight:      Height:        Intake/Output Summary (Last 24 hours) at 11/16/2023 0750 Last data filed at 11/16/2023 0530 Gross per 24 hour  Intake 100 ml  Output 550 ml  Net -450 ml   Filed Weights   11/11/23 1720  Weight: 79.4 kg    Examination:  General: Currently on room air.  No distress.  Looks chronically ill and deconditioned.   ENT/neck: No JVD elevation or palpable thyromegaly noted respiratory: Bilateral decreased breath sounds at bases with some crackles CVS: S1-S2 heard; rate currently controlled abdominal: Soft, nontender, remains slightly distended; no organomegaly, normal bowel sounds heard Extremities: Trace lower extremity edema present bilaterally; no cyanosis  CNS: Awake and alert but still slow to respond and a poor historian.  No focal deficits noted  lymph: No lymphadenopathy palpable Skin: No obvious ecchymosis/rashes psych: Affect is flat.  Currently not agitated musculoskeletal: No obvious joint swelling/tenderness      Data Reviewed: I have personally reviewed following labs and imaging studies  CBC: Recent Labs  Lab 11/11/23 1834  11/13/23 0710 11/16/23 0636  WBC 4.6 4.1 3.9*  NEUTROABS 3.5  --  2.7  HGB 10.7* 9.7* 10.1*  HCT 33.3* 32.1* 33.0*  MCV 88.1 89.9 88.7  PLT 207 190 183   Basic Metabolic Panel: Recent Labs  Lab 11/11/23 1834 11/13/23 0710 11/16/23 0636  NA 136 136 134*  K 4.2 4.1 4.2  CL 99 103 101  CO2 26 22 23   GLUCOSE 127* 100* 93  BUN 8 13 13   CREATININE 0.73 0.59 0.62  CALCIUM  10.1 9.3 9.7  MG  --   --  2.1   GFR: Estimated Creatinine Clearance: 80.8 mL/min (by C-G formula based on SCr of 0.62 mg/dL). Liver Function Tests: Recent Labs  Lab 11/11/23 1834  AST 28  ALT 16  ALKPHOS 64  BILITOT 0.6  PROT 8.0  ALBUMIN 3.6   No results for input(s): LIPASE, AMYLASE in the last 168 hours. No results for input(s): AMMONIA in the last 168 hours. Coagulation Profile: No results for input(s): INR, PROTIME in the last 168 hours. Cardiac Enzymes: No results for input(s): CKTOTAL, CKMB, CKMBINDEX, TROPONINI in the last 168 hours. BNP (last 3 results) Recent Labs    10/18/23 1630  PROBNP 781.0*   HbA1C: No results for input(s): HGBA1C in the last 72 hours. CBG: No results for input(s): GLUCAP in the last 168 hours. Lipid Profile: No results for input(s): CHOL, HDL, LDLCALC, TRIG, CHOLHDL, LDLDIRECT in the last 72 hours. Thyroid  Function Tests: No results for input(s): TSH, T4TOTAL, FREET4, T3FREE, THYROIDAB in the last 72 hours.  Anemia Panel: No results for input(s): VITAMINB12, FOLATE, FERRITIN, TIBC, IRON, RETICCTPCT in the last 72 hours. Sepsis Labs: Recent Labs  Lab 11/12/23 1408 11/13/23 0710  PROCALCITON <0.10 <0.10    Recent Results (from the past 240 hours)  MRSA Next Gen by PCR, Nasal     Status: None   Collection Time: 11/15/23  2:11 PM   Specimen: Nasal Mucosa; Nasal Swab  Result Value Ref Range Status   MRSA by PCR Next Gen NOT DETECTED NOT DETECTED Final    Comment: (NOTE) The GeneXpert MRSA Assay  (FDA approved for NASAL specimens only), is one component of a comprehensive MRSA colonization surveillance program. It is not intended to diagnose MRSA infection nor to guide or monitor treatment for MRSA infections. Test performance is not FDA approved in patients less than 76 years old. Performed at [facility], [address] [place], [place] [address]          Radiology Studies: No results found.       Scheduled Meds:  apixaban   5 mg Oral BID   Gerhardt's butt cream   Topical TID   rosuvastatin   40 mg Oral QHS   Continuous Infusions:  ceFEPime (MAXIPIME) IV 2 g (11/15/23 2318)   vancomycin 1,500 mg (11/15/23 1630)          Sophie Mao, MD [facility] 11/16/2023, 7:50 AM

## 2023-11-17 ENCOUNTER — Encounter: Payer: Self-pay | Admitting: Internal Medicine

## 2023-11-17 ENCOUNTER — Ambulatory Visit: Admitting: Radiation Oncology

## 2023-11-17 DIAGNOSIS — C3490 Malignant neoplasm of unspecified part of unspecified bronchus or lung: Secondary | ICD-10-CM | POA: Diagnosis not present

## 2023-11-17 DIAGNOSIS — I82210 Acute embolism and thrombosis of superior vena cava: Secondary | ICD-10-CM | POA: Diagnosis not present

## 2023-11-17 DIAGNOSIS — J984 Other disorders of lung: Secondary | ICD-10-CM | POA: Diagnosis not present

## 2023-11-17 DIAGNOSIS — R531 Weakness: Secondary | ICD-10-CM | POA: Diagnosis not present

## 2023-11-17 LAB — CBC WITH DIFFERENTIAL/PLATELET
Abs Immature Granulocytes: 0.01 K/uL (ref 0.00–0.07)
Basophils Absolute: 0 K/uL (ref 0.0–0.1)
Basophils Relative: 0 %
Eosinophils Absolute: 0.1 K/uL (ref 0.0–0.5)
Eosinophils Relative: 2 %
HCT: 33 % — ABNORMAL LOW (ref 36.0–46.0)
Hemoglobin: 10.2 g/dL — ABNORMAL LOW (ref 12.0–15.0)
Immature Granulocytes: 0 %
Lymphocytes Relative: 12 %
Lymphs Abs: 0.6 K/uL — ABNORMAL LOW (ref 0.7–4.0)
MCH: 27.1 pg (ref 26.0–34.0)
MCHC: 30.9 g/dL (ref 30.0–36.0)
MCV: 87.5 fL (ref 80.0–100.0)
Monocytes Absolute: 0.5 K/uL (ref 0.1–1.0)
Monocytes Relative: 11 %
Neutro Abs: 3.5 K/uL (ref 1.7–7.7)
Neutrophils Relative %: 75 %
Platelets: 197 K/uL (ref 150–400)
RBC: 3.77 MIL/uL — ABNORMAL LOW (ref 3.87–5.11)
RDW: 14.7 % (ref 11.5–15.5)
WBC: 4.7 K/uL (ref 4.0–10.5)
nRBC: 0 % (ref 0.0–0.2)

## 2023-11-17 LAB — BASIC METABOLIC PANEL WITH GFR
Anion gap: 12 (ref 5–15)
BUN: 11 mg/dL (ref 6–20)
CO2: 24 mmol/L (ref 22–32)
Calcium: 9.8 mg/dL (ref 8.9–10.3)
Chloride: 100 mmol/L (ref 98–111)
Creatinine, Ser: 0.6 mg/dL (ref 0.44–1.00)
GFR, Estimated: >60 mL/min (ref >60)
Glucose, Bld: 91 mg/dL (ref 70–99)
Potassium: 3.7 mmol/L (ref 3.5–5.1)
Sodium: 136 mmol/L (ref 135–145)

## 2023-11-17 LAB — MAGNESIUM: Magnesium: 2.2 mg/dL (ref 1.7–2.4)

## 2023-11-17 MED ORDER — POLYETHYLENE GLYCOL 3350 17 G PO PACK
17.0000 g | PACK | Freq: Every day | ORAL | Status: DC | PRN
  Administered 2023-11-17: 17 g via ORAL
  Filled 2023-11-17: qty 1

## 2023-11-17 NOTE — Plan of Care (Signed)
  Problem: Clinical Measurements: Goal: Will remain free from infection Outcome: Progressing   Problem: Activity: Goal: Risk for activity intolerance will decrease Outcome: Progressing   Problem: Nutrition: Goal: Adequate nutrition will be maintained Outcome: Progressing   Problem: Elimination: Goal: Will not experience complications related to bowel motility Outcome: Progressing Goal: Will not experience complications related to urinary retention Outcome: Progressing   Problem: Safety: Goal: Ability to remain free from injury will improve Outcome: Progressing

## 2023-11-17 NOTE — Plan of Care (Signed)
  Problem: Coping: Goal: Level of anxiety will decrease Outcome: Progressing   Problem: Elimination: Goal: Will not experience complications related to urinary retention Outcome: Progressing   Problem: Pain Managment: Goal: General experience of comfort will improve and/or be controlled Outcome: Progressing   Problem: Safety: Goal: Ability to remain free from injury will improve Outcome: Progressing

## 2023-11-17 NOTE — Progress Notes (Signed)
 This NN was included in a  group discussing the pt's plan of care since she is currently hospitalized. Per [employer] team, Med Onc team, and hospitalist, the focus at this time will be getting the pt medically stable and then proceed with continued work up and treatment. This NN relayed this plan to pt's dtr, Andrea Mullen. NN explained that at this time, the priority is getting the pt medically stable and once discharged, the PET scan can be rescheduled and continue on with the original treatment plan. NN explained that the PET can't be done while pt is admitted. Shortly after speaking to Andrea Mullen, [place] received a call from rad onc admin that the pt's dtr was calling and was very upset. NN requested the call be transferred to NN.  NN confirmed that the dtr that was on the phone was Andrea Mullen, who was also there with pt's other dtr Andrea Mullen. Andrea Mullen was under the impression when she heard about the plan that the patient was not going to get treatment and the cancer will go from stage 3 to 4 and then we're going to have a problem NN assured Andrea Mullen that that is certainly not the case. Explained that the plan for the pt is still going to get treatment, however she just needs to get well enough for discharge. All the preexisting appts the pt has for treatment are going to remain booked. Once she has been discharged, NN will schedule her PET scan appt. After her PET, then she can have her CT simulation for her radiation treatment. Andrea Mullen voiced understanding and appreciation of the plan of care for the pt.

## 2023-11-17 NOTE — Progress Notes (Signed)
 Occupational Therapy Treatment Patient Details Name: Andrea Mullen MRN: [id number] DOB: 1965/04/09 Today's Date: 11/17/2023   History of present illness Andrea Mullen is a 58 y.o. female admitted 11/11/23 with generalized weakness and frequent falls. 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 Neurology and vascular surgery were consulted. She was also found to have right sided cavitary lesion and underwent bronchoscopy.  also had lymph node aspiration which was consistent with non small cell carcinoma, consistent with squamous cell carcinoma. She was transitioned to eliquis . 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 Patient with no vocalizations in session, decreased command following, and need for multi-modal cues for all activities. Patient's daughter awake at the end of the session, stating she is more participatory in the mornings. Patient able to complete minimal grooming ADLs and bed level exercises. Patient often noted to be looking through OT in session. Oxygen stable with HR between 111-114 in supine. OT recommendation remains appropriate, OT will continue to follow.       If plan is discharge home, recommend the following:  A lot of help with bathing/dressing/bathroom;Assistance with cooking/housework;Assist for transportation;Help with stairs or ramp for entrance;Supervision due to cognitive status;Direct supervision/assist for medications management;Direct supervision/assist for financial management;Two people to help with walking and/or transfers   Equipment Recommendations  None recommended by OT (Patient has DME)    Recommendations for Other Services       Precautions / Restrictions Precautions Precautions: Fall Recall of Precautions/Restrictions: Impaired Precaution/Restrictions Comments: Hx ischemic stroke, Blind L eye Restrictions Weight Bearing Restrictions Per Provider Order: No       Mobility Bed Mobility Overal bed mobility: Needs Assistance             General bed mobility comments: increased assist to reposition B shoulders    Transfers                   General transfer comment: did not attempt due to only having OT present     Balance Overall balance assessment: Needs assistance                                         ADL either performed or assessed with clinical judgement   ADL Overall ADL's : Needs assistance/impaired     Grooming: Wash/dry face;Set up;Sitting Grooming Details (indicate cue type and reason): with tactile cues for task completion                             Functional mobility during ADLs: Moderate assistance;+2 for physical assistance;+2 for safety/equipment;Cueing for safety;Cueing for sequencing;Rolling walker (2 wheels) General ADL Comments: Patient seen in order to progress with functional independence, ADL management, and assess cognition. Patient with no vocalizations in session, decreased command following, and need for multi-modal cues for all activities. Patient's daughter awake at the end of the session, stating she is more participatory in the mornings. Patient able to complete minimal grooming ADLs and bed level exercises. Patient often noted to be looking through OT in session. Oxygen stable with  HR between 111-114 in supine. OT recommendation remains appropriate, OT will continue to follow.    Extremity/Trunk Assessment              Occupational psychologist Communication: Impaired Factors Affecting Communication: Difficulty expressing self;Reduced clarity of speech   Cognition  Arousal: Lethargic Behavior During Therapy: Flat affect Cognition: Cognition impaired             OT - Cognition Comments: No vocalizations in session, appearing to look right through OT multiple times, daughter awake at end of session, and stating she is more alert and talkative in the mornings                 Following commands: Impaired Following commands impaired: Only follows one step commands consistently, Follows one step commands with increased time      Cueing   Cueing Techniques: Verbal cues, Tactile cues, Gestural cues  Exercises      Shoulder Instructions       General Comments O2 stable, HR 111-114 throughout session    Pertinent Vitals/ Pain       Pain Assessment Pain Assessment: Faces Faces Pain Scale: Hurts a little bit Pain Location: bilateral shoulders when repositioning Pain Descriptors / Indicators: Discomfort, Grimacing, Guarding Pain Intervention(s): Limited activity within patient's tolerance, Monitored during session, Repositioned  Home Living                                          Prior Functioning/Environment              Frequency  Min 2X/week        Progress Toward Goals  OT Goals(current goals can now be found in the care plan section)  Progress towards OT goals: Progressing toward goals (incrementally)  Acute Rehab OT Goals Patient Stated Goal: did not state OT Goal Formulation: Patient unable to participate in goal setting Time For Goal Achievement: 11/27/23 Potential to Achieve Goals: Fair  Plan      Co-evaluation                 AM-PAC OT 6 Clicks Daily Activity     Outcome Measure   Help from another person eating meals?: A Little Help from another person taking care of personal grooming?: A Little Help from another person toileting, which includes using toliet, bedpan, or urinal?: Total Help from another person bathing (including washing, rinsing, drying)?: A Lot Help from  another person to put on and taking off regular upper body clothing?: A Lot Help from another person to put on and taking off regular lower body clothing?: Total 6 Click Score: 12    End of Session    OT Visit Diagnosis: Unsteadiness on feet (R26.81);Muscle weakness (generalized) (M62.81);Other symptoms and signs involving the nervous system (R29.898);Other symptoms and signs involving cognitive function   Activity Tolerance Patient limited by fatigue;Patient limited by lethargy   Patient Left in bed;with call bell/phone within reach;with family/visitor present;with bed alarm set   Nurse Communication Mobility status        Time: 8551-8496 OT Time Calculation (min): 15 min  Charges: OT General Charges $OT Visit: 1 Visit OT Treatments $Self Care/Home Management : 8-22 mins  Andrea Mullen, OTR/L Acute Rehabilitation Services [phone]   Andrea Mullen 11/17/2023, 3:48  PM

## 2023-11-17 NOTE — Progress Notes (Signed)
 [facility]                                                                              Andrea Mullen, is a 58 y.o. 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  Pathology was consistent with non-small cell lung CA for which she was supposed to start chemoradiation next week as per oncology as an outpatient.   Patient presented with generalized weakness and falls.  Hemoglobin 12.7.   CT head showed no acute intracranial abnormality. CT cervical spine showed no fractures. 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  She was started on IV antibiotics oncology was consulted. PT recommended SNF placement.  Assessment & Plan      Generalized weakness and falls - Possibly in the setting of newly diagnosed lung cancer and SVC thrombosis along with poor oral intake, depression, moderate to severe aortic stenosis - PT recommended SNF placement, TOC assisting.   - Fall precautions   Cavitary mass in the right upper lobe with concern for possible infection - Imaging revealed cavitary  lung mass filled with fluid, cannot rule out infection.  Dr Cheryle discussed with pulmonology, Dr. Jude on 10/4 and was recommended antibiotic treatment.  Initially placed on IV vancomycin, cefepime, now transitioned to oral cefadroxil and Flagyl .     New diagnosis of non-small cell lung cancer History of small cell lung cancer status post radiation and chemotherapy at [facility] 2007 - Recently diagnosed with non-small cell lung cancer/squamous cell carcinoma during recent hospitalization in September 2025.  Oncology was planning to start chemoradiation, now held because of hospitalization.  - Oncology plans to do PET scan as outpatient.   - Radiation oncology consulted, recommended medically stabilizing first and outpatient follow-up with Dr. Sherrod and Dr. Izell after the PET scan completed.  XRT to start after the PET scan.    Head and neck thrombosis -During recent hospitalization in 10/2023 found to have acute thrombus involving SVC, brachiocephalic vein, azygous vein, right axillary vein and right subclavian vein. Also dural sinus thrombosis -repeat imaging continues to show above findings  - New eliquis    History of acute ischemic stroke involving the right splenium and right pons, hyperlipidemia:  - Per neurology, the etiology was likely small vessel disease -Continue high-intensity statin and Eliquis .  - Outpatient  follow-up with neurology    Moderate to severe aortic stenosis - Will need outpatient cardiology evaluation.  Dr. Cheryle discussed with cardiology on-call/Dr. Ren Ny on 11/13/2023.  Cardiology reviewed the echo and recommended outpatient follow-up with cardiology for moderate aortic stenosis    CAD Hypertension - Continue medical management    Hyponatremia - Resolved   Leukopenia - Resolved   Anemia of chronic disease -From chronic illnesses.  Hemoglobin stable will monitor intermittently   History of tobacco abuse and cocaine  abuse --apparently, last use of  cocaine  was 2 years ago.  Has not smoked in a month.   Estimated body mass index is 29.12 kg/m as calculated from the following:   Height as of this encounter: 5' 5 (1.651 m).   Weight as of this encounter: 79.4 kg.  Code Status: Full CODE STATUS DVT Prophylaxis:   apixaban  (ELIQUIS ) tablet 5 mg   Level of Care: Level of care: Telemetry Family Communication: Daughter was in the room however sleeping Disposition Plan:      Remains inpatient appropriate: DC to SNF once bed available   Procedures:    Consultants:   Oncology Radiation oncology  Antimicrobials:   Anti-infectives (From admission, onward)    Start     Dose/Rate Route Frequency Ordered Stop   11/16/23 1600  cefadroxil (DURICEF) capsule 500 mg        500 mg Oral 2 times daily 11/16/23 1009     11/16/23 1100  metroNIDAZOLE  (FLAGYL ) tablet 500 mg        500 mg Oral Every 12 hours 11/16/23 1009     11/12/23 1615  ceFEPIme (MAXIPIME) 2 g in sodium chloride  0.9 % 100 mL IVPB  Status:  Discontinued        2 g 200 mL/hr over 30 Minutes Intravenous Every 8 hours 11/12/23 1515 11/16/23 1003   11/12/23 1600  vancomycin (VANCOREADY) IVPB 1500 mg/300 mL  Status:  Discontinued        1,500 mg 150 mL/hr over 120 Minutes Intravenous Every 24 hours 11/12/23 1513 11/16/23 1003   11/11/23 2300  azithromycin  (ZITHROMAX ) 500 mg in sodium chloride  0.9 % 250 mL IVPB        500 mg 250 mL/hr over 60 Minutes Intravenous  Once 11/11/23 2254 11/12/23 0142   11/11/23 2215  cefTRIAXone  (ROCEPHIN ) 1 g in sodium chloride  0.9 % 100 mL IVPB        1 g 200 mL/hr over 30 Minutes Intravenous  Once 11/11/23 2204 11/11/23 2316          Medications  apixaban   5 mg Oral BID   cefadroxil  500 mg Oral BID   Gerhardt's butt cream   Topical TID   metroNIDAZOLE   500 mg Oral Q12H   rosuvastatin   40 mg Oral QHS      Subjective:   Andrea Mullen was seen and examined today.  Patient alert and oriented, denies any specific complaints.  No  acute issues overnight. Objective:   Vitals:   11/16/23 2156 11/16/23 2200 11/17/23 0453 11/17/23 1350  BP: 111/69  131/72 (!) 133/96  Pulse: (!) 110  (!) 107 (!) 110  Resp: 14  16   Temp: 99.3 F (37.4 C)  98.7 F (37.1 C) 98.9 F (37.2 C)  TempSrc: Oral  Oral Oral  SpO2: 97% 97% 95% 94%  Weight:      Height:        Intake/Output Summary (Last 24 hours) at 11/17/2023 1405 Last data filed at  11/17/2023 1300 Gross per 24 hour  Intake 240 ml  Output 1400 ml  Net -1160 ml     Wt Readings from Last 3 Encounters:  11/11/23 79.4 kg  11/03/23 98.7 kg  10/19/23 98.1 kg     Exam General: Alert and oriented x 3, NAD Cardiovascular: S1 S2 auscultated,  RRR Respiratory: Clear to auscultation bilaterall Gastrointestinal: Soft, nontender, nondistended, + bowel sounds Ext: no pedal edema bilaterally Neuro: No new deficits Psych: Normal affect     Data Reviewed:  I have personally reviewed following labs    CBC Lab Results  Component Value Date   WBC 4.7 11/17/2023   RBC 3.77 (L) 11/17/2023   HGB 10.2 (L) 11/17/2023   HCT 33.0 (L) 11/17/2023   MCV 87.5 11/17/2023   MCH 27.1 11/17/2023   PLT 197 11/17/2023   MCHC 30.9 11/17/2023   RDW 14.7 11/17/2023   LYMPHSABS 0.6 (L) 11/17/2023   MONOABS 0.5 11/17/2023   EOSABS 0.1 11/17/2023   BASOSABS 0.0 11/17/2023     Last metabolic panel Lab Results  Component Value Date   NA 136 11/17/2023   K 3.7 11/17/2023   CL 100 11/17/2023   CO2 24 11/17/2023   BUN 11 11/17/2023   CREATININE 0.60 11/17/2023   GLUCOSE 91 11/17/2023   GFRNONAA >60 11/17/2023   GFRAA >60 08/04/2018   CALCIUM  9.8 11/17/2023   PHOS 3.1 10/25/2023   PROT 8.0 11/11/2023   ALBUMIN 3.6 11/11/2023   BILITOT 0.6 11/11/2023   ALKPHOS 64 11/11/2023   AST 28 11/11/2023   ALT 16 11/11/2023   ANIONGAP 12 11/17/2023    CBG (last 3)  No results for input(s): GLUCAP in the last 72 hours.    Coagulation Profile: No results for input(s): INR,  PROTIME in the last 168 hours.   Radiology Studies: I have personally reviewed the imaging studies  No results found.     Nydia Distance M.D. [facility] 11/17/2023, 2:05 PM  Available via Epic secure chat 7am-7pm After 7 pm, please refer to night coverage provider listed on amion.

## 2023-11-18 ENCOUNTER — Other Ambulatory Visit (HOSPITAL_BASED_OUTPATIENT_CLINIC_OR_DEPARTMENT_OTHER): Payer: Self-pay

## 2023-11-18 ENCOUNTER — Encounter: Payer: Self-pay | Admitting: Internal Medicine

## 2023-11-18 ENCOUNTER — Telehealth: Payer: Self-pay | Admitting: Internal Medicine

## 2023-11-18 ENCOUNTER — Other Ambulatory Visit (HOSPITAL_COMMUNITY): Payer: Self-pay

## 2023-11-18 DIAGNOSIS — Z8673 Personal history of transient ischemic attack (TIA), and cerebral infarction without residual deficits: Secondary | ICD-10-CM | POA: Diagnosis not present

## 2023-11-18 DIAGNOSIS — G08 Intracranial and intraspinal phlebitis and thrombophlebitis: Secondary | ICD-10-CM

## 2023-11-18 DIAGNOSIS — J984 Other disorders of lung: Secondary | ICD-10-CM | POA: Diagnosis not present

## 2023-11-18 DIAGNOSIS — R531 Weakness: Secondary | ICD-10-CM | POA: Diagnosis not present

## 2023-11-18 MED ORDER — CEFADROXIL 500 MG PO CAPS
500.0000 mg | ORAL_CAPSULE | Freq: Two times a day (BID) | ORAL | 0 refills | Status: AC
  Filled 2023-11-18 (×2): qty 28, 14d supply, fill #0

## 2023-11-18 MED ORDER — HYDROCODONE-ACETAMINOPHEN 5-325 MG PO TABS
1.0000 | ORAL_TABLET | Freq: Four times a day (QID) | ORAL | 0 refills | Status: AC | PRN
  Filled 2023-11-18 (×2): qty 20, 5d supply, fill #0

## 2023-11-18 MED ORDER — METRONIDAZOLE 500 MG PO TABS: 500.0000 mg | ORAL_TABLET | Freq: Two times a day (BID) | ORAL | Status: DC

## 2023-11-18 MED ORDER — CEFADROXIL 500 MG PO CAPS: 500.0000 mg | ORAL_CAPSULE | Freq: Two times a day (BID) | ORAL | Status: DC

## 2023-11-18 MED ORDER — METRONIDAZOLE 500 MG PO TABS
500.0000 mg | ORAL_TABLET | Freq: Two times a day (BID) | ORAL | 0 refills | Status: AC
  Filled 2023-11-18 (×2): qty 28, 14d supply, fill #0

## 2023-11-18 MED ORDER — GERHARDT'S BUTT CREAM: 1.0000 | TOPICAL_CREAM | Freq: Three times a day (TID) | CUTANEOUS | Status: DC

## 2023-11-18 MED ORDER — ALBUTEROL SULFATE HFA 108 (90 BASE) MCG/ACT IN AERS
2.0000 | INHALATION_SPRAY | Freq: Four times a day (QID) | RESPIRATORY_TRACT | 2 refills | Status: AC | PRN
  Filled 2023-11-18: qty 6.7, 25d supply, fill #0
  Filled 2023-12-04: qty 8.5, 25d supply, fill #0

## 2023-11-18 MED ORDER — HYDROCODONE-ACETAMINOPHEN 5-325 MG PO TABS: 1.0000 | ORAL_TABLET | Freq: Four times a day (QID) | ORAL | 0 refills | Status: DC | PRN

## 2023-11-18 MED ORDER — GERHARDT'S BUTT CREAM
1.0000 | TOPICAL_CREAM | Freq: Three times a day (TID) | CUTANEOUS | 3 refills | Status: AC
  Filled 2023-11-18 (×2): qty 60, 20d supply, fill #0

## 2023-11-18 NOTE — Progress Notes (Signed)
 Discharge instructions given to patient and family questions asked and answered. Discharge medications delivered to patient at the bedside in a secure bag.

## 2023-11-18 NOTE — TOC Transition Note (Addendum)
 Transition of Care [facility]) - Discharge Note   Patient Details  Name: CAROLEE CHANNELL MRN: [id number] Date of Birth: 10-Jun-1965  [facility]) CM/SW Contact:  Doneta Glenys DASEN, RN Phone Number: 11/18/2023, 2:29 PM   Clinical Narrative:     Per MD patient ready for DC to [facility]. RN to call report prior to discharge [phone] Rm [phone]). RN, patient, patient's family, and facility notified of DC. Discharge Summary and FL2 sent to facility via the HUB. DC packet on chart includes face sheet, medical necessity, signed prescription, and discharge summary. Ambulance [facility] transport requested for patient.  4:51 PM Patient family decided to take patient home. [facility] called and changed address. MD update.[facility] updated  CM will sign off. Please consult us  again if new needs arise.     Final next level of care: Skilled Nursing Facility Barriers to Discharge: Barriers Resolved   Patient Goals and CMS Choice            Discharge Placement              Patient chooses bed at:  [facility]) Patient to be transferred to facility by: [facility] Name of family member notified: Corabeth Patient and family notified of of transfer: 11/18/23  Discharge Plan and Services Additional resources added to the After Visit Summary for                                       Social Drivers of Health (SDOH) Interventions SDOH Screenings   Food Insecurity: No Food Insecurity (11/12/2023)  Recent Concern: Food Insecurity - Food Insecurity Present (10/05/2023)   Received from [facility]  Housing: Low Risk  (11/12/2023)  Recent Concern: Housing - High Risk (10/05/2023)   Received from [facility]  Transportation Needs: No Transportation Needs (11/12/2023)  Utilities: Not At Risk (11/12/2023)  Depression (PHQ2-9): Low Risk  (11/03/2023)  Financial Resource Strain: Medium Risk (10/05/2023)   Received from [facility]  Physical Activity: Inactive (10/05/2023)   Received from  [facility]  Social Connections: Somewhat Isolated (10/05/2023)   Received from [facility]  Stress: Stress Concern Present (10/05/2023)   Received from [facility]  Tobacco Use: High Risk (11/11/2023)     Readmission Risk Interventions    10/27/2023   12:49 PM  Readmission Risk Prevention Plan  Transportation Screening Complete  PCP or Specialist Appt within 3-5 Days Complete  HRI or Home Care Consult Complete  Social Work Consult for Recovery Care Planning/Counseling Complete  Palliative Care Screening Not Applicable  Medication Review Oceanographer) Complete

## 2023-11-18 NOTE — Plan of Care (Signed)
  Problem: Elimination: Goal: Will not experience complications related to bowel motility Outcome: Progressing Goal: Will not experience complications related to urinary retention Outcome: Progressing   Problem: Safety: Goal: Ability to remain free from injury will improve Outcome: Progressing   Problem: Skin Integrity: Goal: Risk for impaired skin integrity will decrease Outcome: Progressing   

## 2023-11-18 NOTE — Progress Notes (Signed)
 Patient and daughter asking for a inhaler states they dont have one at home. Message sent to Dr Davia FORBES Chalk RN patients nurse informed.

## 2023-11-18 NOTE — Progress Notes (Signed)
 Patient transported off unit with [facility] via stretcher on room air. Daughter at bedside. No acute distress noted, family requested patient advocate info, provided them with patient experience contact info. Patient and family denies needing anything else at this time.

## 2023-11-18 NOTE — TOC Progression Note (Signed)
 Transition of Care [facility]) - Progression Note    Patient Details  Name: Andrea Mullen MRN: [id number] Date of Birth: [birth year]  [facility]) CM/SW Contact  Doneta Glenys DASEN, RN Phone Number: 11/18/2023, 11:51 AM  Clinical Narrative:    [facility] accepted patient  and will start insurance auth.   Barriers to Discharge: Continued Medical Work up               Expected Discharge Plan and Services                                               Social Drivers of Health (SDOH) Interventions SDOH Screenings   Food Insecurity: No Food Insecurity (11/12/2023)  Recent Concern: Food Insecurity - Food Insecurity Present (10/05/2023)   Received from [facility]  Housing: Low Risk  (11/12/2023)  Recent Concern: Housing - High Risk (10/05/2023)   Received from [facility]  Transportation Needs: No Transportation Needs (11/12/2023)  Utilities: Not At Risk (11/12/2023)  Depression (PHQ2-9): Low Risk  (11/03/2023)  Financial Resource Strain: Medium Risk (10/05/2023)   Received from [facility]  Physical Activity: Inactive (10/05/2023)   Received from [facility]  Social Connections: Somewhat Isolated (10/05/2023)   Received from [facility]  Stress: Stress Concern Present (10/05/2023)   Received from [facility]  Tobacco Use: High Risk (11/11/2023)    Readmission Risk Interventions    10/27/2023   12:49 PM  Readmission Risk Prevention Plan  Transportation Screening Complete  PCP or Specialist Appt within 3-5 Days Complete  HRI or Home Care Consult Complete  Social Work Consult for Recovery Care Planning/Counseling Complete  Palliative Care Screening Not Applicable  Medication Review Oceanographer) Complete

## 2023-11-18 NOTE — Progress Notes (Signed)
 Called report to Nathanel, Charity fundraiser at [employer]

## 2023-11-18 NOTE — Discharge Summary (Signed)
 Physician Discharge Summary   Patient: Andrea Mullen MRN: [id number] DOB: 10/03/1965  Admit date:     11/11/2023  Discharge date: 11/18/23  Discharge Physician: Nydia Distance, MD    PCP: Center, [facility]   Recommendations at discharge:   Continue cefadroxil 500 mg p.o. twice daily for 2 weeks Continue Flagyl  500 mg twice daily for 2 weeks Outpatient referral to pulmonology sent for the cavitary mass if needs any further antibiotics.  Patient was supposed to have appointment with Dr. Jude.  She has an appointment with oncology on 10//21.  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  Pathology was consistent with non-small cell lung CA for which she was supposed to start chemoradiation next week as per oncology as an outpatient.    Patient presented with generalized weakness and falls.  Hemoglobin 12.7.   CT head showed no acute intracranial abnormality. CT cervical spine showed no fractures. CTA chest showed persistent thrombotic occlusion of the brachiocephalic venous arch extending into the SVC with possible nonocclusive thrombus in the right axillosubclavian vein and azygous vein and partially occlusive thrombus in the  right internal mammary vein along with right upper lobe cavitary mass now almost fully filled with fluid, infectious complication not excluded.  She was started on IV antibiotics oncology was consulted. PT recommended SNF placement.   Assessment and Plan:   Generalized weakness and falls - Possibly in the setting of newly diagnosed lung cancer and SVC thrombosis along with poor oral intake, depression, moderate to severe aortic stenosis - PT recommended SNF placement - Fall precautions   Cavitary mass in the right upper lobe with concern for possible infection - Imaging revealed cavitary lung mass filled with fluid, cannot rule out infection. - Dr Cheryle discussed with pulmonology, Dr. Jude on 10/4 and was recommended antibiotic treatment.  Initially placed on IV vancomycin, cefepime,  - Now transitioned to oral cefadroxil and Flagyl .   -Outpatient follow-up with Dr. Jude (pulmonology)   New diagnosis of non-small cell lung cancer History of small cell lung cancer status post radiation and chemotherapy at [facility] 2007 - Recently diagnosed with non-small cell lung cancer/squamous cell carcinoma during recent hospitalization in September 2025.  Oncology was planning to start chemoradiation, now held because of hospitalization.  - Oncology plans to do PET scan as outpatient.   - Radiation oncology consulted, recommended medically stabilizing first and outpatient follow-up with Dr. Sherrod and Dr. Izell after the PET scan completed.  XRT to start after the PET scan.    Head and neck thrombosis -During recent hospitalization in 10/2023 found to have acute thrombus involving SVC, brachiocephalic vein, azygous vein, right axillary vein and right subclavian vein. Also dural sinus thrombosis -repeat imaging continues to show  above findings  - Continue eliquis     History of acute ischemic stroke involving the right splenium and right pons, hyperlipidemia:  - Per neurology, the etiology was likely  small vessel disease -Continue high-intensity statin and Eliquis .  - Outpatient follow-up with neurology    Moderate to severe aortic stenosis - Will need outpatient cardiology evaluation.  Dr. Cheryle discussed with cardiology on-call/Dr. Ren Ny on 11/13/2023.  Cardiology reviewed the echo and recommended outpatient follow-up with cardiology for moderate aortic stenosis    CAD Hypertension - Stable, continue medical management    Hyponatremia - Resolved   Leukopenia - Resolved   Anemia of chronic disease -From chronic illnesses.  - H&H stable   History of tobacco abuse and cocaine  abuse --apparently, last use of cocaine  was 2 years ago.  Has not smoked in a month.     Estimated body mass index is 29.12 kg/m as calculated from the following:   Height as of this encounter: 5' 5 (1.651 m).   Weight as of this encounter: 79.4 kg.     Pain control - [place]  Controlled Substance Reporting System database was reviewed. and patient was instructed, not to drive, operate heavy machinery, perform activities at heights, swimming or participation in water activities or provide baby-sitting services while on Pain, Sleep and Anxiety Medications; until their outpatient Physician has advised to do so again. Also recommended to not to take more than prescribed Pain, Sleep and Anxiety Medications.  Consultants: Pulmonology, oncology, radiation oncology Procedures performed:   Disposition: Skilled nursing facility Diet recommendation:  Discharge Diet Orders (From admission, onward)     Start     Ordered   11/18/23 0000  Diet - low sodium heart healthy        11/18/23 1418            DISCHARGE MEDICATION: Allergies as of 11/18/2023       Reactions   Bicillin C-r Hives   Penicillins Hives      Nsaids Hives   Prednisone  Other (See Comments)   Change in mental status         Medication List     TAKE these medications    albuterol  108 (90 Base) MCG/ACT  inhaler Commonly known as: VENTOLIN  HFA Inhale 2 puffs into the lungs every 4 (four) hours as needed for wheezing or shortness of breath.   albuterol  (2.5 MG/3ML) 0.083% nebulizer solution Commonly known as: PROVENTIL  Take 3 mLs (2.5 mg total) by nebulization every 6 (six) hours as needed for wheezing or shortness of breath.   cefadroxil 500 MG capsule Commonly known as: DURICEF Take 1 capsule (500 mg total) by mouth 2 (two) times daily for 14 days.   cyclobenzaprine  5 MG tablet Commonly known as: FLEXERIL  Take 1 tablet (5 mg total) by mouth 3 (three) times daily as needed for muscle spasms.   Eliquis  5 MG Tabs tablet Generic drug: apixaban  Take 1 tablet (5 mg total) by mouth 2 (two) times daily.   Gerhardt's butt cream Crea Apply 1 Application topically 3 (three) times daily. Apply to affected area   HYDROcodone -acetaminophen  5-325 MG tablet Commonly known as: NORCO/VICODIN Take 1 tablet by mouth every 6 (six) hours as needed for moderate pain (pain score 4-6).   lidocaine -prilocaine  cream Commonly known as: EMLA  Apply to affected area once   metroNIDAZOLE  500 MG tablet Commonly known as: FLAGYL  Take 1 tablet (500 mg total) by mouth 2 (two) times daily for 14 days.   ondansetron  8 MG  tablet Commonly known as: Zofran  Take 1 tablet (8 mg total) by mouth every 8 (eight) hours as needed for nausea or vomiting. Start on the third day after chemotherapy.   polyethylene glycol powder 17 GM/SCOOP powder Commonly known as: GLYCOLAX /MIRALAX  Take 17 g by mouth 2 (two) times daily as needed for mild constipation or moderate constipation. Dissolve 1 capful (17g) in 4-8 ounces of liquid and take by mouth daily.   prochlorperazine  10 MG tablet Commonly known as: COMPAZINE  Take 1 tablet (10 mg total) by mouth every 6 (six) hours as needed.   rosuvastatin  40 MG tablet Commonly known as: CRESTOR  Take 1 tablet (40 mg total) by mouth daily.   Stool Softener/Laxative 50-8.6 MG  tablet Generic drug: senna-docusate Take 1 tablet by mouth 2 (two) times daily.        Contact information for follow-up providers     Center, [facility] Schedule an appointment as soon as possible for a visit in 2 week(s).   Why: for hospital follow-up Contact information: [address] [place] [address] [phone]         Heilingoetter, Cassandra L, PA-C Follow up on 11/30/2023.   Specialty: Physician Assistant Why: at 9:00AM, for hospital follow-up Contact information: [address] [place] [address] [phone]         Jude Harden GAILS, MD. Schedule an appointment as soon as possible for a visit in 2 week(s).   Specialty: Pulmonary Disease Why: for hospital follow-up Contact information: [address] [place] [address] [phone]         [facility]. Schedule an appointment as soon as possible for a visit in 2 week(s).   Specialty: Neurology Why: for hospital follow-up Contact information: [address] [place]  [address] [phone]             Contact information for after-discharge care     Destination     [facility] .   Service: Skilled Nursing Contact information: [address] [place]  [address] [phone]                    Discharge Exam: Fredricka Weights   11/11/23 1720  Weight: 79.4 kg   S: No acute complaints, daughter at the bedside.  Awaiting SNF.  BP 121/79 (BP Location: Right Arm)   Pulse (!) 107   Temp 98.6 F (37 C) (Oral)   Resp 18   Ht 5' 5 (1.651 m)   Wt 79.4 kg   LMP 11/16/2016   SpO2 95%   BMI 29.12 kg/m   Physical Exam General: Alert and oriented x 3, NAD Cardiovascular: S1 S2 clear, RRR.  Respiratory: CTAB, no wheezing Gastrointestinal: Soft, nontender, nondistended, NBS Ext: no pedal edema bilaterally Neuro: no new deficits Psych: Normal  affect    Condition at discharge: fair  The results of significant diagnostics from this hospitalization (including imaging, microbiology, ancillary and laboratory) are listed below for reference.   Imaging Studies: CT Angio Chest PE W and/or Wo Contrast Result Date: 11/11/2023 CLINICAL DATA:  Clemens 2 days ago with head and neck trauma and midline neck tenderness. Reports taking blood thinners. Increased leg weakness and swelling. Pulmonary embolism suspected. Prior history of lung cancer. EXAM: CT HEAD WITHOUT CONTRAST CT CERVICAL SPINE WITHOUT CONTRAST CT CHEST, ABDOMEN AND PELVIS WITH CONTRAST TECHNIQUE: Contiguous axial images were obtained from the base of the skull through the vertex without intravenous contrast. Multidetector CT  imaging of the cervical spine was performed without intravenous contrast. Multiplanar CT image reconstructions were also generated. Multidetector CT imaging of the chest was performed following the standard protocol during bolus administration of intravenous contrast. Multiplanar CT image reconstructions and MIPS were obtained to evaluate the vascular anatomy. RADIATION DOSE REDUCTION: This exam was performed according to the departmental dose-optimization program which includes automated exposure control, adjustment of the mA and/or kV according to patient size and/or use of iterative reconstruction technique. CONTRAST:  OMNIPAQUE  IOHEXOL  350 MG/ML SOLN COMPARISON:  MRI brain 10/19/2023, CTA head and neck and reconstructions 11/08/2021, soft tissue neck CT 10/18/2023, CTA chest 10/18/2023 and 10/14/2023. FINDINGS: CT HEAD FINDINGS Brain: There is moderately advanced atrophy, small-vessel disease and atrophic ventriculomegaly. Multiple tiny chronic bilateral cerebellar lacunar infarcts are again noted, small chronic lacunar infarcts in the right thalamus and right pons, and a few bilateral chronic basal ganglia lacunae are again noted. No cortical based acute infarct,  hemorrhage, mass or mass effect are seen. No new midline shift. Basal cisterns are clear. Vascular: Age advanced calcific plaque both siphons. No hyperdense vessel is seen. 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 If this is still present, it is not visible with noncontrast CT. Skull: Negative for fractures or focal lesions. Sinuses/Orbits: Negative orbits. Increased patchy opacification ethmoid air cells and increased membrane thickening both maxillary and frontal sinuses. The sphenoid sinuses clear. Again noted is a diffuse right mastoid effusion and partial opacification in the right middle ear. There is increased moderate patchy left mastoid effusion with clear left middle ear cavity. Other: None. CT CERVICAL FINDINGS Alignment: There is a straightened slightly reversed cervical lordosis. There is no listhesis. No widening of the anterior atlantodental joint. No C1-2 lateral mass offset. Skull base and vertebrae: No acute fracture is evident. No primary bone lesion or focal pathologic process. Soft tissues and spinal canal: No prevertebral fluid or swelling. No visible canal hematoma. There is trace calcification at the carotid bifurcations. There is no thyroid  or laryngeal mass. Unremarkable parotid and submandibular glands. Disc levels: The C5-6 disc is degenerative with small bidirectional osteophytes. The other discs are normal in heights. No significant soft tissue or bony encroachment on the thecal sac and cord is seen. There are slight facet spurring changes at most levels, uncinate spurring at C5-6. Foraminal stenosis is only seen at C5-6 where it is mild. The other foramina appear to be patent. Small anterior endplate spurring is seen at C3-4, C4-5 and C6-7 but no bulky osteophytes or bridging bone. Other:  None. CT CHEST FINDINGS Cardiovascular: There are extensive mediastinal, anterior chest wall and  paraspinal venous collateral vessels filling with dense contrast. 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 The SVC was better demonstrated on last study due to the right-sided injection on that exam. Extensive but nonocclusive thrombus was previously noted in the right axillosubclavian vein and could still be present, but this is not evaluated due to the left-sided injection today. The distal SVC and cavoatrial junction, and the cardiac chambers opacify well. Nonocclusive thrombus in the azygous vein is also again noted. Partially occlusive thrombus also appears to still be present within an engorged right internal mammary vein on series 8 images 40-54. The heart is slightly enlarged, with left ventricular and septal hypertrophy and patchy calcification of  the mitral ring. There is no enlargement of the right chambers. Small pericardial effusion is again noted. There are normal caliber pulmonary arteries, adequately opacified to the segmental level with no evidence of arterial embolism, but the subsegmental arteries largely unopacified and not evaluated. There are moderate patchy calcific plaques in the aorta and proximal great vessels skull with heavy calcifications and thickening of the aortic valve leaflets without aortic aneurysm, dissection or stenosis. There are scattered three-vessel coronary artery calcific plaques. The pulmonary veins are normal in caliber. Mediastinum/Nodes: Stable prominent right mid hilar lymph node of 1.4 cm short axis on 8:54. Right paratracheal area is difficult to assess due to the engorged thrombosed SVC but no new abnormality is seen in the area. I do not see further adenopathy. Lungs/Pleura: Interval new small layering right pleural effusion. No left pleural effusion. Right upper lobe irregularly thick-walled cavitary lesion posteriorly again noted  measuring 5.1 x 4.1 cm, not notably changed in size but with increased fluid now almost completely filling the cavity. Infectious complication not excluded. There are scarring changes, mild volume loss in the right middle lobe. Stable 7 mm ground-glass right upper lobe nodule on 10:66. Remaining lungs are generally clear. Upper abdomen: No acute abnormality. Musculoskeletal: No regional bone metastases. Review of the MIP images confirms the above findings. IMPRESSION: 1. No acute intracranial CT findings or depressed skull fractures. 2. Atrophy, small-vessel disease and multiple chronic lacunar infarcts. 3. Sinus disease with increased membrane thickening in the frontal and maxillary sinuses, increased patchy opacification of the ethmoid air cells. 4. Persistent right mastoid effusion and partial opacification of the right middle ear. Interval increased left mastoid effusion. 5. No evidence of cervical fractures or malalignment. 6. C5-6 degenerative disc disease and spondylosis with mild foraminal stenosis. 7. No evidence of pulmonary arterial dilatation or embolus through the segmental level. Subsegmental arteries are largely unopacified and not evaluated. 8. 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 9. Not well evaluated, there may still be nonocclusive thrombus in the right axillosubclavian vein and azygous vein, and there is partially occlusive thrombus in the right internal mammary vein. 10. Interval new small layering right pleural effusion. 11. 5.1 x 4.1 cm right upper lobe cavitary mass is now almost fully filled with fluid. Infectious complication not excluded. 12. Stable 7 mm ground-glass right upper lobe nodule. 13. Stable prominent right mid hilar lymph node. 14. Aortic and coronary artery atherosclerosis. Heavily calcified thickened aortic valve  leaflets. Aortic Atherosclerosis (ICD10-I70.0). Electronically Signed   By: Francis Quam M.D.   On: 11/11/2023 20:59   CT Head Wo Contrast Result Date: 11/11/2023 CLINICAL DATA:  Clemens 2 days ago with head and neck trauma and midline neck tenderness. Reports taking blood thinners. Increased leg weakness and swelling. Pulmonary embolism suspected. Prior history of lung cancer. EXAM: CT HEAD WITHOUT CONTRAST CT CERVICAL SPINE WITHOUT CONTRAST CT CHEST, ABDOMEN AND PELVIS WITH CONTRAST TECHNIQUE: Contiguous axial images were obtained from the base of the skull through the vertex without intravenous contrast. Multidetector CT imaging of the cervical spine was performed without intravenous contrast. Multiplanar CT image reconstructions were also generated. Multidetector CT imaging of the chest was performed following the standard protocol during bolus administration of intravenous contrast. Multiplanar CT image reconstructions and MIPS were obtained to evaluate the vascular anatomy. RADIATION DOSE REDUCTION: This exam was performed according to the departmental dose-optimization program which includes automated  exposure control, adjustment of the mA and/or kV according to patient size and/or use of iterative reconstruction technique. CONTRAST:  OMNIPAQUE  IOHEXOL  350 MG/ML SOLN COMPARISON:  MRI brain 10/19/2023, CTA head and neck and reconstructions 11/08/2021, soft tissue neck CT 10/18/2023, CTA chest 10/18/2023 and 10/14/2023. FINDINGS: CT HEAD FINDINGS Brain: There is moderately advanced atrophy, small-vessel disease and atrophic ventriculomegaly. Multiple tiny chronic bilateral cerebellar lacunar infarcts are again noted, small chronic lacunar infarcts in the right thalamus and right pons, and a few bilateral chronic basal ganglia lacunae are again noted. No cortical based acute infarct, hemorrhage, mass or mass effect are seen. No new midline shift. Basal cisterns are clear. Vascular: Age advanced calcific  plaque both siphons. No hyperdense vessel is seen. 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 If this is still present, it is not visible with noncontrast CT. Skull: Negative for fractures or focal lesions. Sinuses/Orbits: Negative orbits. Increased patchy opacification ethmoid air cells and increased membrane thickening both maxillary and frontal sinuses. The sphenoid sinuses clear. Again noted is a diffuse right mastoid effusion and partial opacification in the right middle ear. There is increased moderate patchy left mastoid effusion with clear left middle ear cavity. Other: None. CT CERVICAL FINDINGS Alignment: There is a straightened slightly reversed cervical lordosis. There is no listhesis. No widening of the anterior atlantodental joint. No C1-2 lateral mass offset. Skull base and vertebrae: No acute fracture is evident. No primary bone lesion or focal pathologic process. Soft tissues and spinal canal: No prevertebral fluid or swelling. No visible canal hematoma. There is trace calcification at the carotid bifurcations. There is no thyroid  or laryngeal mass. Unremarkable parotid and submandibular glands. Disc levels: The C5-6 disc is degenerative with small bidirectional osteophytes. The other discs are normal in heights. No significant soft tissue or bony encroachment on the thecal sac and cord is seen. There are slight facet spurring changes at most levels, uncinate spurring at C5-6. Foraminal stenosis is only seen at C5-6 where it is mild. The other foramina appear to be patent. Small anterior endplate spurring is seen at C3-4, C4-5 and C6-7 but no bulky osteophytes or bridging bone. Other:  None. CT CHEST FINDINGS Cardiovascular: There are extensive mediastinal, anterior chest wall and paraspinal venous collateral vessels filling with dense contrast. Thrombotic occlusion in the brachiocephalic venous arch  continues to be seen extending into the SVC although there may still be a very small residual SVC lumen laterally. The SVC was better demonstrated on last study due to the right-sided injection on that exam. Extensive but nonocclusive thrombus was previously noted in the right axillosubclavian vein and could still be present, but this is not evaluated due to the left-sided injection today. The distal SVC and cavoatrial junction, and the cardiac chambers opacify well. Nonocclusive thrombus in the azygous vein is also again noted. Partially occlusive thrombus also appears to still be present within an engorged right internal mammary vein on series 8 images 40-54. The heart is slightly enlarged, with left ventricular and septal hypertrophy and patchy calcification of the mitral ring. There is no enlargement of the right chambers. Small pericardial effusion is again noted. There are normal caliber pulmonary arteries, adequately opacified to the segmental level with no evidence of arterial embolism, but the subsegmental arteries largely unopacified and not evaluated. There are moderate patchy calcific plaques in the aorta and proximal great vessels skull with heavy calcifications and thickening of  the aortic valve leaflets without aortic aneurysm, dissection or stenosis. There are scattered three-vessel coronary artery calcific plaques. The pulmonary veins are normal in caliber. Mediastinum/Nodes: Stable prominent right mid hilar lymph node of 1.4 cm short axis on 8:54. Right paratracheal area is difficult to assess due to the engorged thrombosed SVC but no new abnormality is seen in the area. I do not see further adenopathy. Lungs/Pleura: Interval new small layering right pleural effusion. No left pleural effusion. Right upper lobe irregularly thick-walled cavitary lesion posteriorly again noted measuring 5.1 x 4.1 cm, not notably changed in size but with increased fluid now almost completely filling the cavity.  Infectious complication not excluded. There are scarring changes, mild volume loss in the right middle lobe. Stable 7 mm ground-glass right upper lobe nodule on 10:66. Remaining lungs are generally clear. Upper abdomen: No acute abnormality. Musculoskeletal: No regional bone metastases. Review of the MIP images confirms the above findings. IMPRESSION: 1. No acute intracranial CT findings or depressed skull fractures. 2. Atrophy, small-vessel disease and multiple chronic lacunar infarcts. 3. Sinus disease with increased membrane thickening in the frontal and maxillary sinuses, increased patchy opacification of the ethmoid air cells. 4. Persistent right mastoid effusion and partial opacification of the right middle ear. Interval increased left mastoid effusion. 5. No evidence of cervical fractures or malalignment. 6. C5-6 degenerative disc disease and spondylosis with mild foraminal stenosis. 7. No evidence of pulmonary arterial dilatation or embolus through the segmental level. Subsegmental arteries are largely unopacified and not evaluated. 8. 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 9. Not well evaluated, there may still be nonocclusive thrombus in the right axillosubclavian vein and azygous vein, and there is partially occlusive thrombus in the right internal mammary vein. 10. Interval new small layering right pleural effusion. 11. 5.1 x 4.1 cm right upper lobe cavitary mass is now almost fully filled with fluid. Infectious complication not excluded. 12. Stable 7 mm ground-glass right upper lobe nodule. 13. Stable prominent right mid hilar lymph node. 14. Aortic and coronary artery atherosclerosis. Heavily calcified thickened aortic valve leaflets. Aortic Atherosclerosis (ICD10-I70.0). Electronically Signed   By: Francis Quam M.D.   On: 11/11/2023 20:59    CT Cervical Spine Wo Contrast Result Date: 11/11/2023 CLINICAL DATA:  Clemens 2 days ago with head and neck trauma and midline neck tenderness. Reports taking blood thinners. Increased leg weakness and swelling. Pulmonary embolism suspected. Prior history of lung cancer. EXAM: CT HEAD WITHOUT CONTRAST CT CERVICAL SPINE WITHOUT CONTRAST CT CHEST, ABDOMEN AND PELVIS WITH CONTRAST TECHNIQUE: Contiguous axial images were obtained from the base of the skull through the vertex without intravenous contrast. Multidetector CT imaging of the cervical spine was performed without intravenous contrast. Multiplanar CT image reconstructions were also generated. Multidetector CT imaging of the chest was performed following the standard protocol during bolus administration of intravenous contrast. Multiplanar CT image reconstructions and MIPS were obtained to evaluate the vascular anatomy. RADIATION DOSE REDUCTION: This exam was performed according to the departmental dose-optimization program which includes automated exposure control, adjustment of the mA and/or kV according to patient size and/or use of iterative reconstruction technique. CONTRAST:  OMNIPAQUE  IOHEXOL  350 MG/ML SOLN COMPARISON:  MRI brain 10/19/2023, CTA head and neck and reconstructions 11/08/2021, soft tissue neck CT 10/18/2023, CTA chest 10/18/2023 and 10/14/2023. FINDINGS: CT HEAD FINDINGS Brain: There is moderately advanced atrophy, small-vessel disease and atrophic ventriculomegaly.  Multiple tiny chronic bilateral cerebellar lacunar infarcts are again noted, small chronic lacunar infarcts in the right thalamus and right pons, and a few bilateral chronic basal ganglia lacunae are again noted. No cortical based acute infarct, hemorrhage, mass or mass effect are seen. No new midline shift. Basal cisterns are clear. Vascular: Age advanced calcific plaque both siphons. No hyperdense vessel is seen. 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 If this is still present, it is not visible with noncontrast CT. Skull: Negative for fractures or focal lesions. Sinuses/Orbits: Negative orbits. Increased patchy opacification ethmoid air cells and increased membrane thickening both maxillary and frontal sinuses. The sphenoid sinuses clear. Again noted is a diffuse right mastoid effusion and partial opacification in the right middle ear. There is increased moderate patchy left mastoid effusion with clear left middle ear cavity. Other: None. CT CERVICAL FINDINGS Alignment: There is a straightened slightly reversed cervical lordosis. There is no listhesis. No widening of the anterior atlantodental joint. No C1-2 lateral mass offset. Skull base and vertebrae: No acute fracture is evident. No primary bone lesion or focal pathologic process. Soft tissues and spinal canal: No prevertebral fluid or swelling. No visible canal hematoma. There is trace calcification at the carotid bifurcations. There is no thyroid  or laryngeal mass. Unremarkable parotid and submandibular glands. Disc levels: The C5-6 disc is degenerative with small bidirectional osteophytes. The other discs are normal in heights. No significant soft tissue or bony encroachment on the thecal sac and cord is seen. There are slight facet spurring changes at most levels, uncinate spurring at C5-6. Foraminal stenosis is only seen at C5-6 where it is mild. The other foramina appear to be patent. Small anterior endplate spurring is seen at C3-4, C4-5 and C6-7 but no bulky osteophytes or bridging bone. Other:  None. CT CHEST FINDINGS Cardiovascular: There are extensive mediastinal, anterior chest wall and paraspinal venous collateral vessels filling with dense contrast. Thrombotic occlusion in the brachiocephalic venous arch continues to be seen extending into the SVC although there may still be a very small residual SVC lumen  laterally. The SVC was better demonstrated on last study due to the right-sided injection on that exam. Extensive but nonocclusive thrombus was previously noted in the right axillosubclavian vein and could still be present, but this is not evaluated due to the left-sided injection today. The distal SVC and cavoatrial junction, and the cardiac chambers opacify well. Nonocclusive thrombus in the azygous vein is also again noted. Partially occlusive thrombus also appears to still be present within an engorged right internal mammary vein on series 8 images 40-54. The heart is slightly enlarged, with left ventricular and septal hypertrophy and patchy calcification of the mitral ring. There is no enlargement of the right chambers. Small pericardial effusion is again noted. There are normal caliber pulmonary arteries, adequately opacified to the segmental level with no evidence of arterial embolism, but the subsegmental arteries largely unopacified and not evaluated. There are moderate patchy calcific plaques in the aorta and proximal great vessels skull with heavy calcifications and thickening of the aortic valve leaflets without aortic aneurysm, dissection or stenosis. There are scattered three-vessel coronary artery calcific plaques. The pulmonary veins are normal in caliber. Mediastinum/Nodes: Stable prominent right mid hilar lymph node of 1.4 cm short axis on 8:54. Right paratracheal area is difficult to assess due to the engorged thrombosed SVC but no new abnormality is seen in the area. I  do not see further adenopathy. Lungs/Pleura: Interval new small layering right pleural effusion. No left pleural effusion. Right upper lobe irregularly thick-walled cavitary lesion posteriorly again noted measuring 5.1 x 4.1 cm, not notably changed in size but with increased fluid now almost completely filling the cavity. Infectious complication not excluded. There are scarring changes, mild volume loss in the right middle lobe.  Stable 7 mm ground-glass right upper lobe nodule on 10:66. Remaining lungs are generally clear. Upper abdomen: No acute abnormality. Musculoskeletal: No regional bone metastases. Review of the MIP images confirms the above findings. IMPRESSION: 1. No acute intracranial CT findings or depressed skull fractures. 2. Atrophy, small-vessel disease and multiple chronic lacunar infarcts. 3. Sinus disease with increased membrane thickening in the frontal and maxillary sinuses, increased patchy opacification of the ethmoid air cells. 4. Persistent right mastoid effusion and partial opacification of the right middle ear. Interval increased left mastoid effusion. 5. No evidence of cervical fractures or malalignment. 6. C5-6 degenerative disc disease and spondylosis with mild foraminal stenosis. 7. No evidence of pulmonary arterial dilatation or embolus through the segmental level. Subsegmental arteries are largely unopacified and not evaluated. 8. 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 9. Not well evaluated, there may still be nonocclusive thrombus in the right axillosubclavian vein and azygous vein, and there is partially occlusive thrombus in the right internal mammary vein. 10. Interval new small layering right pleural effusion. 11. 5.1 x 4.1 cm right upper lobe cavitary mass is now almost fully filled with fluid. Infectious complication not excluded. 12. Stable 7 mm ground-glass right upper lobe nodule. 13. Stable prominent right mid hilar lymph node. 14. Aortic and coronary artery atherosclerosis. Heavily calcified thickened aortic valve leaflets. Aortic Atherosclerosis (ICD10-I70.0). Electronically Signed   By: Francis Quam M.D.   On: 11/11/2023 20:59   US  Venous Img Lower Right (DVT Study) Result Date: 11/11/2023 EXAM: ULTRASOUND DUPLEX OF THE RIGHT LOWER  EXTREMITY VEINS TECHNIQUE: Duplex ultrasound using B-mode/gray scaled imaging and Doppler spectral analysis and color flow was obtained of the deep venous structures of the right lower extremity. COMPARISON: None. CLINICAL HISTORY: Swelling, lung cancer. FINDINGS: The common femoral vein, femoral vein, popliteal vein, and posterior tibial vein of the right lower extremity demonstrate normal compressibility with normal color flow and spectral analysis. IMPRESSION: 1. No evidence of DVT. Electronically signed by: Ozell Daring MD 11/11/2023 07:10 PM EDT RP Workstation: [id number]   CT ABDOMEN PELVIS W CONTRAST Result Date: 10/25/2023 EXAM: CT ABDOMEN AND PELVIS WITH CONTRAST 10/25/2023 06:32:57 PM TECHNIQUE: CT of the abdomen and pelvis was performed with the administration of intravenous contrast. Multiplanar reformatted images are provided for review. Automated exposure control, iterative reconstruction, and/or weight-based adjustment of the mA/kV was utilized to reduce the radiation dose to as low as reasonably achievable. COMPARISON: None available. CLINICAL HISTORY: Metastatic disease evaluation. History of lung cancer. Tracking code: Bo. FINDINGS: LOWER CHEST: Visualized lung bases are clear. LIVER: The liver is unremarkable. GALLBLADDER AND BILE DUCTS: Gallbladder is unremarkable. No biliary ductal dilatation. SPLEEN: No acute abnormality. PANCREAS: No acute abnormality. ADRENAL GLANDS: No acute abnormality. KIDNEYS, URETERS AND BLADDER: No stones in the kidneys or ureters. No hydronephrosis. No perinephric or periureteral stranding. Urinary bladder is unremarkable. GI AND BOWEL: Moderate descending and sigmoid colonic diverticulosis. The stomach, small bowel, and large bowel are otherwise unremarkable. Normal appendix. PERITONEUM AND RETROPERITONEUM: No ascites. No free air.  VASCULATURE: Mild aortoiliac atherosclerotic calcification. Extensive calcification of the aortic valve leaflets. Suspected bicuspid  aortic valve. Calcification of the mitral valve leaflets. Global cardiac size within normal limits. Echocardiography may be helpful to better assess the aortic valve morphology and the degree of valvular dysfunction. LYMPH NODES: No lymphadenopathy. REPRODUCTIVE ORGANS: Partially calcified uterine fibroids result in a lobulated morphology of the uterus. No adnexal masses are seen. BONES AND SOFT TISSUES: Osseous structures are age appropriate. No acute bone abnormality. No lytic or blastic bone lesion. 4.2 cm rounded subdermal lesion is seen within the left gluteal region which may represent a sebaceous cyst, but is not well characterized on this examination. Umbilical/ventral hernia repair with mesh has been performed. No recurrent abdominal wall hernia. RAF SCORE: Aortic atherosclerosis (ICD10-I70.0) IMPRESSION: 1. No evidence of metastatic disease within the abdomen and pelvis. 2. Moderate descending and sigmoid colonic diverticulosis without evidence of diverticulitis. 3. Mild aortoiliac atherosclerotic calcification. Electronically signed by: Dorethia Molt MD 10/25/2023 09:28 PM EDT RP Workstation: [id number]   DG CHEST PORT 1 VIEW Result Date: 10/24/2023 CLINICAL DATA:  Shortness of breath. EXAM: PORTABLE CHEST 1 VIEW COMPARISON:  Chest radiograph dated 10/22/2023. FINDINGS: Cavitary mass in the right upper lobe measures 5.2 cm in diameter. No new consolidation. There is no pleural effusion pneumothorax. The cardiac silhouette is within normal limits. No acute osseous pathology. IMPRESSION: Cavitary mass in the right upper lobe. Electronically Signed   By: Vanetta Chou M.D.   On: 10/24/2023 13:50   DG CHEST PORT 1 VIEW Result Date: 10/22/2023 CLINICAL DATA:  Status post bronchoscopy EXAM: PORTABLE CHEST 1 VIEW COMPARISON:  October 18, 2023 FINDINGS: Redemonstration of cavitary right upper lobe lung mass. No new consolidations. No pleural effusions. No pneumothorax. Unchanged cardiomediastinal  silhouette with known right paratracheal lymphadenopathy. No acute osseous findings. IMPRESSION: Unchanged right upper lobe cavitary lung mass and right paratracheal lymphadenopathy. No acute findings. Electronically Signed   By: Michaeline Blanch M.D.   On: 10/22/2023 11:58   ECHOCARDIOGRAM COMPLETE Result Date: 10/20/2023    ECHOCARDIOGRAM REPORT   Patient Name:   XANIYAH BUCHHOLZ Cheatum Date of Exam: 10/20/2023 Medical Rec #:  [id number]        Height:       65.0 in Accession #:    [id number]       Weight:       216.3 lb Date of Birth:  06/23/1965        BSA:          2.045 m Patient Age:    57 years         BP:           104/73 mmHg Patient Gender: F                HR:           105 bpm. Exam Location:  Inpatient Procedure: 2D Echo, Cardiac Doppler and Color Doppler (Both Spectral and Color            Flow Doppler were utilized during procedure). Indications:    Pulmonary Embolus  History:        Patient has prior history of Echocardiogram examinations, most                 recent 11/09/2021. CAD, Stroke, Signs/Symptoms:Chest Pain; Risk                 Factors:Hypertension and Current Smoker.  Sonographer:    Juliene Rucks Referring Phys: [phone] RONDELL  A SMITH IMPRESSIONS  1. Left ventricular ejection fraction, by estimation, is 60 to 65%. The left ventricle has normal function. The left ventricle has no regional wall motion abnormalities. Left ventricular diastolic parameters are consistent with Grade I diastolic dysfunction (impaired relaxation). Elevated left ventricular end-diastolic pressure.  2. Right ventricular systolic function was not well visualized. The right ventricular size is not well visualized.  3. The mitral valve is degenerative. Trivial mitral valve regurgitation. Moderate to severe mitral annular calcification.  4. The aortic valve was not adequately interrogated to determine stenosis - there is calcification of the valve and aortic root. Image 67 demonstrates an aortic valve gradient of at least 30  mmHg peak and 14 mmHg, mean.. The aortic valve has an indeterminant number of cusps. There is severe calcifcation of the aortic valve. Aortic valve regurgitation is trivial. Moderate to severe aortic valve stenosis.  5. The inferior vena cava is normal in size with greater than 50% respiratory variability, suggesting right atrial pressure of 3 mmHg. Comparison(s): Changes from prior study are noted. 11/09/2021: LVEF 60-65%, severe AS - possibly bicuspid aortic valve. Conclusion(s)/Recommendation(s): Consider further evaluation of the aortic valve with TEE. FINDINGS  Left Ventricle: Left ventricular ejection fraction, by estimation, is 60 to 65%. The left ventricle has normal function. The left ventricle has no regional wall motion abnormalities. The left ventricular internal cavity size was normal in size. There is  no left ventricular hypertrophy. Left ventricular diastolic parameters are consistent with Grade I diastolic dysfunction (impaired relaxation). Elevated left ventricular end-diastolic pressure. Right Ventricle: The right ventricular size is not well visualized. Right vetricular wall thickness was not well visualized. Right ventricular systolic function was not well visualized. Left Atrium: Left atrial size was normal in size. Right Atrium: Right atrial size was normal in size. Pericardium: There is no evidence of pericardial effusion. Mitral Valve: The mitral valve is degenerative in appearance. Moderate to severe mitral annular calcification. Trivial mitral valve regurgitation. Tricuspid Valve: The tricuspid valve is not well visualized. Tricuspid valve regurgitation is not demonstrated. Aortic Valve: The aortic valve was not adequately interrogated to determine stenosis - there is calcification of the valve and aortic root. Image 67 demonstrates an aortic valve gradient of at least 30 mmHg peak and 14 mmHg, mean. The aortic valve has an  indeterminant number of cusps. There is severe calcifcation of the  aortic valve. There is moderate aortic valve annular calcification. Aortic valve regurgitation is trivial. Moderate to severe aortic stenosis is present. Pulmonic Valve: The pulmonic valve was not well visualized. Pulmonic valve regurgitation is not visualized. Aorta: The aortic root and ascending aorta are structurally normal, with no evidence of dilitation. Venous: The inferior vena cava is normal in size with greater than 50% respiratory variability, suggesting right atrial pressure of 3 mmHg. IAS/Shunts: The interatrial septum was not well visualized.  LEFT VENTRICLE PLAX 2D LVIDd:         5.20 cm   Diastology LVIDs:         4.60 cm   LV e' medial:    4.24 cm/s LV PW:         0.80 cm   LV E/e' medial:  30.2 LV IVS:        0.90 cm   LV e' lateral:   7.07 cm/s LVOT diam:     1.70 cm   LV E/e' lateral: 18.1 LVOT Area:     2.27 cm  RIGHT VENTRICLE RV S prime:     5.34  cm/s LEFT ATRIUM           Index LA diam:      3.00 cm 1.47 cm/m LA Vol (A2C): 48.1 ml 23.52 ml/m LA Vol (A4C): 40.9 ml 20.00 ml/m   AORTA Ao Root diam: 2.60 cm MITRAL VALVE MV Area (PHT): 4.39 cm     SHUNTS MV Decel Time: 173 msec     Systemic Diam: 1.70 cm MV E velocity: 128.00 cm/s MV A velocity: 162.00 cm/s MV E/A ratio:  0.79 Vinie Maxcy MD Electronically signed by Vinie Maxcy MD Signature Date/Time: 10/20/2023/6:31:01 PM    Final    VAS US  LOWER EXTREMITY VENOUS (DVT) Result Date: 10/20/2023  Lower Venous DVT Study Patient Name:  AHNYLA MENDEL  Date of Exam:   10/20/2023 Medical Rec #: [id number]         Accession #:    [id number] Date of Birth: December 22, 1965         Patient Gender: F Patient Age:   16 years Exam Location:  [facility] Procedure:      VAS US  LOWER EXTREMITY VENOUS (DVT) Referring Phys: MAXIMINO SHARPS --------------------------------------------------------------------------------  Indications: Pulmonary embolism.  Performing Technologist: Elmarie Lindau, RVT  Examination Guidelines: A complete evaluation includes  B-mode imaging, spectral Doppler, color Doppler, and power Doppler as needed of all accessible portions of each vessel. Bilateral testing is considered an integral part of a complete examination. Limited examinations for reoccurring indications may be performed as noted. The reflux portion of the exam is performed with the patient in reverse Trendelenburg.  +---------+---------------+---------+-----------+----------+--------------+ RIGHT    CompressibilityPhasicitySpontaneityPropertiesThrombus Aging +---------+---------------+---------+-----------+----------+--------------+ CFV      Full           Yes      Yes                                 +---------+---------------+---------+-----------+----------+--------------+ SFJ      Full                                                        +---------+---------------+---------+-----------+----------+--------------+ FV Prox  Full                                                        +---------+---------------+---------+-----------+----------+--------------+ FV Mid   Full                                                        +---------+---------------+---------+-----------+----------+--------------+ FV DistalFull                                                        +---------+---------------+---------+-----------+----------+--------------+ PFV      Full                                                        +---------+---------------+---------+-----------+----------+--------------+  POP      Partial                                      Chronic        +---------+---------------+---------+-----------+----------+--------------+ PTV      Full                                                        +---------+---------------+---------+-----------+----------+--------------+ PERO     Full                                                         +---------+---------------+---------+-----------+----------+--------------+   +---------+---------------+---------+-----------+----------+--------------+ LEFT     CompressibilityPhasicitySpontaneityPropertiesThrombus Aging +---------+---------------+---------+-----------+----------+--------------+ CFV      Full           Yes      Yes                                 +---------+---------------+---------+-----------+----------+--------------+ SFJ      Full                                                        +---------+---------------+---------+-----------+----------+--------------+ FV Prox  Full                                                        +---------+---------------+---------+-----------+----------+--------------+ FV Mid   Full                                                        +---------+---------------+---------+-----------+----------+--------------+ FV DistalFull                                                        +---------+---------------+---------+-----------+----------+--------------+ PFV      Full                                                        +---------+---------------+---------+-----------+----------+--------------+ POP      Partial                                      Chronic        +---------+---------------+---------+-----------+----------+--------------+  PTV      Full                                                        +---------+---------------+---------+-----------+----------+--------------+ PERO     Full                                                        +---------+---------------+---------+-----------+----------+--------------+     Summary: RIGHT: - Findings consistent 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  - No cystic structure found in the popliteal fossa.  LEFT: - Findings consistent with chronic deep vein thrombosis involving the left popliteal vein.  - No cystic  structure found in the popliteal fossa.  *See table(s) above for measurements and observations. Electronically signed by Lonni Gaskins MD on 10/20/2023 at 2:53:16 PM.    Final    MR Brain W and Wo Contrast Result Date: 10/19/2023 CLINICAL DATA:  Initial evaluation for dural venous sinus thrombosis, acute neuro deficit. EXAM: MRI HEAD WITHOUT AND WITH CONTRAST TECHNIQUE: Multiplanar, multiecho pulse sequences of the brain and surrounding structures were obtained without and with intravenous contrast. CONTRAST:  10mL GADAVIST  GADOBUTROL  1 MMOL/ML IV SOLN COMPARISON:  Comparison made with CT from earlier the same day as well as multiple previous exams. FINDINGS: Brain: Diffuse prominence of the CSF containing spaces compatible generalized cerebral atrophy. Patchy and confluent T2/FLAIR hyperintensity involving the periventricular deep white matter both cerebral hemispheres, consistent with chronic microvascular ischemic disease, moderately advanced in nature. Mild patchy involvement of the pons and cerebellum noted. Few remote lacunar infarcts noted about the bilateral basal ganglia. Remote right pontine infarct with a few scattered small remote bilateral cerebellar infarcts. 6 mm acute ischemic nonhemorrhagic infarcts seen involving the right splenium (series 5, image 75). No other evidence for acute or subacute ischemia. Gray-white matter differentiation otherwise maintained. No acute intracranial hemorrhage. Multiple scattered chronic micro hemorrhages noted, likely hypertensive in nature. No mass lesion, midline shift, or mass effect. Mild ventricular prominence related to global parenchymal volume loss of hydrocephalus. No extra-axial fluid collection. Pituitary gland and suprasellar region within normal limits. There is an apparent 6 mm focus of enhancement involving the right pons (series 17, image 14). This is at the location of an underlying right pontine infarct as well as a few prominent chronic micro  hemorrhages. Given this, finding is suspected to reflect enhancement related to a small of all vein subacute infarct or possibly artifact related to the adjacent microhemorrhage. No other abnormal enhancement elsewhere within the brain. Vascular: Major intracranial arterial vascular flow voids are maintained. Previously identified dural venous sinus thrombosis involving the right transverse and sigmoid sinuses with extension into the right jugular bulb and visualized proximal right internal jugular vein, corresponding with findings on recent exams. Overall, appearance is relatively similar. Skull and upper cervical spine: Craniocervical junction within normal limits. Heterogeneous and mildly decreased T1 signal intensity within the visualized bone marrow, nonspecific, but most commonly related to anemia, smoking or obesity. No scalp soft tissue abnormality. Sinuses/Orbits: Globes orbital soft tissues within normal limits. Mild chronic mucosal thickening noted about the paranasal sinuses. Moderate to large right mastoid and middle  ear effusion. Trace left mastoid effusion noted. Other: None. IMPRESSION: 1. 6 mm acute ischemic nonhemorrhagic infarct involving the right splenium. 2. 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 Overall, appearance is relatively stable from prior. 3. 6 mm focus of enhancement involving the right pons, favored to reflect enhancement related to a small subacute infarct or possibly artifact related to the adjacent microhemorrhage. Short interval follow-up MRI in 3 months suggested to ensure resolution and/or stability. 4. Underlying age-related cerebral atrophy with moderately advanced chronic microvascular ischemic disease, with a few scattered remote lacunar infarcts involving the bilateral basal ganglia, right pons, and bilateral cerebellar  hemispheres. 5. Multiple scattered chronic micro hemorrhages, likely hypertensive in nature. Electronically Signed   By: Morene Hoard M.D.   On: 10/19/2023 23:23    Microbiology: Results for orders placed or performed during the hospital encounter of 11/11/23  MRSA Next Gen by PCR, Nasal     Status: None   Collection Time: 11/15/23  2:11 PM   Specimen: Nasal Mucosa; Nasal Swab  Result Value Ref Range Status   MRSA by PCR Next Gen NOT DETECTED NOT DETECTED Final    Comment: (NOTE) The GeneXpert MRSA Assay (FDA approved for NASAL specimens only), is one component of a comprehensive MRSA colonization surveillance program. It is not intended to diagnose MRSA infection nor to guide or monitor treatment for MRSA infections. Test performance is not FDA approved in patients less than 24 years old. Performed at [facility], [address], [place], [place] [address]     Labs: CBC: Recent Labs  Lab 11/11/23 1834 11/13/23 0710 11/16/23 0636 11/17/23 0612  WBC 4.6 4.1 3.9* 4.7  NEUTROABS 3.5  --  2.7 3.5  HGB 10.7* 9.7* 10.1* 10.2*  HCT 33.3* 32.1* 33.0* 33.0*  MCV 88.1 89.9 88.7 87.5  PLT 207 190 183 197   Basic Metabolic Panel: Recent Labs  Lab 11/11/23 1834 11/13/23 0710 11/16/23 0636 11/17/23 0612  NA 136 136 134* 136  K 4.2 4.1 4.2 3.7  CL 99 103 101 100  CO2 26 22 23 24   GLUCOSE 127* 100* 93 91  BUN 8 13 13 11   CREATININE 0.73 0.59 0.62 0.60  CALCIUM  10.1 9.3 9.7 9.8  MG  --   --  2.1 2.2   Liver Function Tests: Recent Labs  Lab 11/11/23 1834  AST 28  ALT 16  ALKPHOS 64  BILITOT 0.6  PROT 8.0  ALBUMIN 3.6   CBG: No results for input(s): GLUCAP in the last 168 hours.  Discharge time spent: greater than 30 minutes.  Signed: Nydia Distance, MD [facility] 11/18/2023

## 2023-11-18 NOTE — Telephone Encounter (Signed)
 Patient requesting after discharge to send a prescription of albuterol  inhaler to [facility]. Prescription sent.     Nydia Distance M.D.  [facility] 11/18/2023, 6:44 PM

## 2023-11-18 NOTE — Progress Notes (Signed)
 [facility]                                                                              Andrea Mullen, is a 58 y.o. 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  Pathology was consistent with non-small cell lung CA for which she was supposed to start chemoradiation next week as per oncology as an outpatient.   Patient presented with generalized weakness and falls.  Hemoglobin 12.7.   CT head showed no acute intracranial abnormality. CT cervical spine showed no fractures. 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  She was started on IV antibiotics oncology was consulted. PT recommended SNF placement.  Assessment & Plan      Generalized weakness and falls - Possibly in the setting of newly diagnosed lung cancer and SVC thrombosis along with poor oral intake, depression, moderate to severe aortic stenosis - PT recommended SNF placement, TOC assisting.   - Fall precautions   Cavitary mass in the right upper lobe with concern for possible infection - Imaging revealed cavitary  lung mass filled with fluid, cannot rule out infection. - Dr Cheryle discussed with pulmonology, Dr. Jude on 10/4 and was recommended antibiotic treatment.  Initially placed on IV vancomycin, cefepime,  - Now transitioned to oral cefadroxil and Flagyl .     New diagnosis of non-small cell lung cancer History of small cell lung cancer status post radiation and chemotherapy at [facility] 2007 - Recently diagnosed with non-small cell lung cancer/squamous cell carcinoma during recent hospitalization in September 2025.  Oncology was planning to start chemoradiation, now held because of hospitalization.  - Oncology plans to do PET scan as outpatient.   - Radiation oncology consulted, recommended medically stabilizing first and outpatient follow-up with Dr. Sherrod and Dr. Izell after the PET scan completed.  XRT to start after the PET scan.    Head and neck thrombosis -During recent hospitalization in 10/2023 found to have acute thrombus involving SVC, brachiocephalic vein, azygous vein, right axillary vein and right subclavian vein. Also dural sinus thrombosis -repeat imaging continues to show above findings  - Continue eliquis     History of acute ischemic stroke involving the right splenium and right pons, hyperlipidemia:  - Per neurology, the etiology was likely small vessel disease -Continue high-intensity statin and Eliquis .  -  Outpatient follow-up with neurology    Moderate to severe aortic stenosis - Will need outpatient cardiology evaluation.  Dr. Cheryle discussed with cardiology on-call/Dr. Ren Ny on 11/13/2023.  Cardiology reviewed the echo and recommended outpatient follow-up with cardiology for moderate aortic stenosis    CAD Hypertension - Stable, continue medical management    Hyponatremia - Resolved   Leukopenia - Resolved   Anemia of chronic disease -From chronic illnesses.  - H&H stable   History of tobacco abuse and cocaine  abuse --apparently, last use of cocaine  was 2  years ago.  Has not smoked in a month.   Estimated body mass index is 29.12 kg/m as calculated from the following:   Height as of this encounter: 5' 5 (1.651 m).   Weight as of this encounter: 79.4 kg.  Code Status: Full CODE STATUS DVT Prophylaxis:   apixaban  (ELIQUIS ) tablet 5 mg   Level of Care: Level of care: Telemetry Family Communication: Updated patient's daughter at the bedside Disposition Plan:      Remains inpatient appropriate: DC to SNF once bed available, medically ready   Procedures:    Consultants:   Oncology Radiation oncology  Antimicrobials:   Anti-infectives (From admission, onward)    Start     Dose/Rate Route Frequency Ordered Stop   11/16/23 1600  cefadroxil (DURICEF) capsule 500 mg        500 mg Oral 2 times daily 11/16/23 1009     11/16/23 1100  metroNIDAZOLE  (FLAGYL ) tablet 500 mg        500 mg Oral Every 12 hours 11/16/23 1009     11/12/23 1615  ceFEPIme (MAXIPIME) 2 g in sodium chloride  0.9 % 100 mL IVPB  Status:  Discontinued        2 g 200 mL/hr over 30 Minutes Intravenous Every 8 hours 11/12/23 1515 11/16/23 1003   11/12/23 1600  vancomycin (VANCOREADY) IVPB 1500 mg/300 mL  Status:  Discontinued        1,500 mg 150 mL/hr over 120 Minutes Intravenous Every 24 hours 11/12/23 1513 11/16/23 1003   11/11/23 2300  azithromycin  (ZITHROMAX ) 500 mg in sodium chloride  0.9 % 250 mL IVPB        500 mg 250 mL/hr over 60 Minutes Intravenous  Once 11/11/23 2254 11/12/23 0142   11/11/23 2215  cefTRIAXone  (ROCEPHIN ) 1 g in sodium chloride  0.9 % 100 mL IVPB        1 g 200 mL/hr over 30 Minutes Intravenous  Once 11/11/23 2204 11/11/23 2316          Medications  apixaban   5 mg Oral BID   cefadroxil  500 mg Oral BID   Gerhardt's butt cream   Topical TID   metroNIDAZOLE   500 mg Oral Q12H   rosuvastatin   40 mg Oral QHS      Subjective:   Andrea Mullen was seen and examined today.  Patient alert and oriented, daughter at the bedside, no acute  complaints.    Objective:   Vitals:   11/17/23 1350 11/17/23 1955 11/18/23 0413 11/18/23 1151  BP: (!) 133/96 125/79 121/79   Pulse: (!) 110 (!) 110 (!) 107   Resp:  18 18   Temp: 98.9 F (37.2 C) 97.8 F (36.6 C) 98.6 F (37 C)   TempSrc: Oral Oral Oral   SpO2: 94% 99% 98% 95%  Weight:      Height:        Intake/Output Summary (Last 24 hours) at 11/18/2023 1357 Last data  filed at 11/18/2023 1030 Gross per 24 hour  Intake --  Output 1600 ml  Net -1600 ml     Wt Readings from Last 3 Encounters:  11/11/23 79.4 kg  11/03/23 98.7 kg  10/19/23 98.1 kg   Physical Exam General: Alert and oriented x 3, NAD Cardiovascular: S1 S2 clear, RRR.  Respiratory: CTAB, no wheezing Gastrointestinal: Soft, nontender, nondistended, NBS Ext: no pedal edema bilaterally Neuro: no new deficits Psych: Normal affect     Data Reviewed:  I have personally reviewed following labs    CBC Lab Results  Component Value Date   WBC 4.7 11/17/2023   RBC 3.77 (L) 11/17/2023   HGB 10.2 (L) 11/17/2023   HCT 33.0 (L) 11/17/2023   MCV 87.5 11/17/2023   MCH 27.1 11/17/2023   PLT 197 11/17/2023   MCHC 30.9 11/17/2023   RDW 14.7 11/17/2023   LYMPHSABS 0.6 (L) 11/17/2023   MONOABS 0.5 11/17/2023   EOSABS 0.1 11/17/2023   BASOSABS 0.0 11/17/2023     Last metabolic panel Lab Results  Component Value Date   NA 136 11/17/2023   K 3.7 11/17/2023   CL 100 11/17/2023   CO2 24 11/17/2023   BUN 11 11/17/2023   CREATININE 0.60 11/17/2023   GLUCOSE 91 11/17/2023   GFRNONAA >60 11/17/2023   GFRAA >60 08/04/2018   CALCIUM  9.8 11/17/2023   PHOS 3.1 10/25/2023   PROT 8.0 11/11/2023   ALBUMIN 3.6 11/11/2023   BILITOT 0.6 11/11/2023   ALKPHOS 64 11/11/2023   AST 28 11/11/2023   ALT 16 11/11/2023   ANIONGAP 12 11/17/2023    CBG (last 3)  No results for input(s): GLUCAP in the last 72 hours.    Coagulation Profile: No results for input(s): INR, PROTIME in the last 168  hours.   Radiology Studies: I have personally reviewed the imaging studies  No results found.     Nydia Distance M.D. [facility] 11/18/2023, 1:57 PM  Available via Epic secure chat 7am-7pm After 7 pm, please refer to night coverage provider listed on amion.

## 2023-11-19 ENCOUNTER — Other Ambulatory Visit (HOSPITAL_BASED_OUTPATIENT_CLINIC_OR_DEPARTMENT_OTHER): Payer: Self-pay

## 2023-11-19 ENCOUNTER — Other Ambulatory Visit: Payer: Self-pay

## 2023-11-19 ENCOUNTER — Other Ambulatory Visit (HOSPITAL_COMMUNITY): Payer: Self-pay

## 2023-11-19 NOTE — Progress Notes (Signed)
 NN called pt's dtrs for an update on the pt and assess barriers. No answer at either phone. LVM requesting a call back at earliest convenience.

## 2023-11-22 ENCOUNTER — Telehealth: Payer: Self-pay | Admitting: Radiation Oncology

## 2023-11-22 ENCOUNTER — Other Ambulatory Visit: Payer: Self-pay

## 2023-11-22 ENCOUNTER — Inpatient Hospital Stay

## 2023-11-22 NOTE — Telephone Encounter (Signed)
 Called pt's daughter to schedule FUN/SIM with Dr. Gorden Due. Daughter [name] expressed prior to scheduling she needs to speak with someone regarding assistance. She advised at this time pt is refusing to take medication (she got her to take it this morning 10/13) and she will find pt on the floor with pt claiming she put [herself] there. She is concerned how this is possible with pt's left leg weakness. Daughter is concerned about increased fall risk and not being able to lift pt without assistance. She is asking for c/b asap to help provide her with suggestions or resources.   When offering appt times and advising we would be working around [employer] appts to minimize travel, she advised she was not aware of any other appts aside from her 10/20 PET. MedOnc scheduling team also tagged in message to call pt's daughter [name] to review these upcoming appts.   I advised [name] I would f/u with her at the end of the week to make sure all concerns were addressed as requested prior to scheduling any additional appts.

## 2023-11-23 ENCOUNTER — Telehealth: Payer: Self-pay | Admitting: Radiology

## 2023-11-23 LAB — FUNGUS CULTURE WITH STAIN

## 2023-11-23 LAB — FUNGAL ORGANISM REFLEX

## 2023-11-23 LAB — FUNGUS CULTURE RESULT

## 2023-11-23 NOTE — Telephone Encounter (Signed)
 I called the patient's daughter, Andrea Mullen, to address concerns prior to radiation follow-up appointment. She states that they are working to find a rehabilitation center for their mother for her left leg weakness from her history of clots. Her mother has early onset dementia and does live with her daughter. I briefly explained her mother's work-up and what radiation would entail. Andrea Mullen states that her mother and her family want to remain aggressive in treatment. They would like to proceed with radiation at this time. We will schedule the patient for a follow-up visit and treatment planning.     Leeroy Due, PA-C

## 2023-11-24 NOTE — Progress Notes (Signed)
 NN reached out to both [name] and [place] for an update on the pt. [name] answered however since she was sleeping, NN said she could call back at a later time. NN then reached out to [place], there was no answer. NN left VM requesting a call back.

## 2023-11-25 ENCOUNTER — Other Ambulatory Visit: Payer: Self-pay

## 2023-11-26 ENCOUNTER — Telehealth: Payer: Self-pay | Admitting: Radiation Oncology

## 2023-11-26 NOTE — Telephone Encounter (Signed)
 Spoke to pt's daughter to schedule consultation with PA Ellie and CT SIM. Appts made for 10/21.

## 2023-11-28 NOTE — Progress Notes (Deleted)
 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  He was found to have a thick-walled cavitary lesion in the right upper lobe, mediastinal, and hilar lymphadenopathy.  She has non-specific prominent subcentimeter bilateral supraclavicular lymph nodes.   ***PET  PDL1: Insufficient material   PRIOR THERAPY: None  CURRENT THERAPY: concurrent chemoradiation with weekly carboplatin for an AUC of 2 and paclitaxel 45 mg/m. First dose on 12/06/23.   INTERVAL HISTORY: KYLANI WIRES 58 y.o. female returns to the clinic today for a follow up visit. The patient was seen in the clinic on 11/03/23. She was recently diagnosed with non-small cell lung cancer, squamous cell carcinoma.   She had a PET scan to complete the staging workup. She is scheduled to see Dr. Izell today and they are planning on simulation ***.   She was hospitalized in the interval for fall and weakness on 10/2-10/9. She also had leg swelling. 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 She receive IV antibiotics.She also has moderate to severe aortic stenosis. She is expected to follow up with Dr. Jude on ***. She is on eliquis . She is expected to follow up with cardiology on ***.   Overall the patient is weak and fatigued.  She has early onset dementia.   ***Make sure she had chemo ed   She denies any fever, night sweats, or unexplained weight loss.  She reports a good appetite. Her shortness of breath is better since she got discharge from the hospital and she is not using her inhaler as frequently as before but she still has shortness of breath. He has intermittent cough and is not taking any cough medications. he denies any nausea or vomiting. She has not had a bowel movement in 2 days but does not like taking laxatives and she missed her stool softener dose. Denies any headache or visual changes.     MEDICAL HISTORY: Past Medical History:  Diagnosis Date   Anxiety    Asthma    Blind    left eye   Cancer (HCC)    lung, tx radiation and chemo   Hyperlipemia    Hypertension    Stroke (HCC)     ALLERGIES:  is allergic to bicillin c-r, penicillins, nsaids, and prednisone .  MEDICATIONS:  Current Outpatient Medications  Medication Sig Dispense Refill   albuterol  (PROVENTIL ) (2.5 MG/3ML) 0.083% nebulizer solution Take 3 mLs (2.5 mg total) by nebulization every 6 (six) hours as needed for wheezing or shortness of breath. (Patient not taking: Reported on 10/19/2023) 75 mL 12   albuterol  (VENTOLIN  HFA) 108 (90 Base) MCG/ACT inhaler Inhale 2 puffs into the lungs every 4 (four) hours as needed for wheezing or shortness of breath. 18 g 0   albuterol  (VENTOLIN  HFA) 108 (90 Base) MCG/ACT inhaler Inhale 2 puffs into the lungs every 6 (six) hours as needed for wheezing or shortness of breath. 8.5 g 2   apixaban  (ELIQUIS ) 5 MG TABS tablet Take 1 tablet (5 mg total) by mouth 2 (two) times daily. 60  tablet 0   cefadroxil (DURICEF) 500 MG capsule Take 1 capsule (500 mg total) by mouth 2 (two) times daily for 14 days. 28 capsule 0   cyclobenzaprine  (FLEXERIL ) 5 MG tablet Take 1 tablet (5 mg total) by mouth 3 (three) times daily as needed for muscle spasms. 30 tablet 0   HYDROcodone -acetaminophen  (NORCO/VICODIN) 5-325 MG tablet Take 1 tablet by mouth every 6 (six) hours as needed for moderate pain (pain score 4-6). 20 tablet 0   lidocaine -prilocaine   (EMLA ) cream Apply to affected area once 30 g 3   metroNIDAZOLE  (FLAGYL ) 500 MG tablet Take 1 tablet (500 mg total) by mouth 2 (two) times daily for 14 days. 28 tablet 0   Nystatin (GERHARDT'S BUTT CREAM) CREA Apply 1 Application topically 3 (three) times daily. Apply to affected area 60 each 3   ondansetron  (ZOFRAN ) 8 MG tablet Take 1 tablet (8 mg total) by mouth every 8 (eight) hours as needed for nausea or vomiting. Start on the third day after chemotherapy. 30 tablet 1   polyethylene glycol powder (GLYCOLAX /MIRALAX ) 17 GM/SCOOP powder Take 17 g by mouth 2 (two) times daily as needed for mild constipation or moderate constipation. Dissolve 1 capful (17g) in 4-8 ounces of liquid and take by mouth daily. (Patient not taking: Reported on 11/12/2023) 238 g 0   prochlorperazine  (COMPAZINE ) 10 MG tablet Take 1 tablet (10 mg total) by mouth every 6 (six) hours as needed. 30 tablet 2   rosuvastatin  (CRESTOR ) 40 MG tablet Take 1 tablet (40 mg total) by mouth daily. (Patient not taking: Reported on 10/19/2023) 30 tablet 3   senna-docusate (SENOKOT-S) 8.6-50 MG tablet Take 1 tablet by mouth 2 (two) times daily. 60 tablet 0   No current facility-administered medications for this visit.    SURGICAL HISTORY:  Past Surgical History:  Procedure Laterality Date   CARDIAC CATHETERIZATION N/A 11/01/2014   Procedure: Left Heart Cath and Coronary Angiography;  Surgeon: Victory LELON Sharps, MD;  Location: [facility] INVASIVE CV LAB;  Service: Cardiovascular;  Laterality: N/A;   CESAREAN SECTION     x3   HERNIA REPAIR     VIDEO BRONCHOSCOPY WITH ENDOBRONCHIAL ULTRASOUND Right 10/22/2023   Procedure: BRONCHOSCOPY, WITH EBUS;  Surgeon: Jude Harden GAILS, MD;  Location: [facility] ENDOSCOPY;  Service: Cardiopulmonary;  Laterality: Right;    REVIEW OF SYSTEMS:   Review of Systems  Constitutional: Negative for appetite change, chills, fatigue, fever and unexpected weight change.  HENT:   Negative for mouth sores, nosebleeds, sore throat and  trouble swallowing.   Eyes: Negative for eye problems and icterus.  Respiratory: Negative for cough, hemoptysis, shortness of breath and wheezing.   Cardiovascular: Negative for chest pain and leg swelling.  Gastrointestinal: Negative for abdominal pain, constipation, diarrhea, nausea and vomiting.  Genitourinary: Negative for bladder incontinence, difficulty urinating, dysuria, frequency and hematuria.   Musculoskeletal: Negative for back pain, gait problem, neck pain and neck stiffness.  Skin: Negative for itching and rash.  Neurological: Negative for dizziness, extremity weakness, gait problem, headaches, light-headedness and seizures.  Hematological: Negative for adenopathy. Does not bruise/bleed easily.  Psychiatric/Behavioral: Negative for confusion, depression and sleep disturbance. The patient is not nervous/anxious.     PHYSICAL EXAMINATION:  Last menstrual period 11/16/2016.  ECOG PERFORMANCE STATUS: {CHL ONC ECOG [id number]  Physical Exam  Constitutional: Oriented to person, place, and time and well-developed, well-nourished, and in no distress. No distress.  HENT:  Head: Normocephalic and atraumatic.  Mouth/Throat: Oropharynx is clear and moist. No oropharyngeal exudate.  Eyes: Conjunctivae are normal. Right eye exhibits no discharge. Left eye exhibits no discharge. No scleral icterus.  Neck: Normal range of motion. Neck supple.  Cardiovascular: Normal rate, regular rhythm, normal heart sounds and intact distal pulses.   Pulmonary/Chest: Effort normal and breath sounds normal. No respiratory distress. No wheezes. No rales.  Abdominal: Soft. Bowel sounds are normal. Exhibits no distension and no mass. There is no tenderness.  Musculoskeletal: Normal range of motion. Exhibits no edema.  Lymphadenopathy:    No cervical adenopathy.  Neurological: Alert and oriented to person, place, and time. Exhibits normal muscle tone. Gait normal. Coordination normal.  Skin: Skin is  warm and dry. No rash noted. Not diaphoretic. No erythema. No pallor.  Psychiatric: Mood, memory and judgment normal.  Vitals reviewed.  LABORATORY DATA: Lab Results  Component Value Date   WBC 4.7 11/17/2023   HGB 10.2 (L) 11/17/2023   HCT 33.0 (L) 11/17/2023   MCV 87.5 11/17/2023   PLT 197 11/17/2023      Chemistry      Component Value Date/Time   NA 136 11/17/2023 0612   K 3.7 11/17/2023 0612   CL 100 11/17/2023 0612   CO2 24 11/17/2023 0612   BUN 11 11/17/2023 0612   CREATININE 0.60 11/17/2023 0612   CREATININE 0.68 11/03/2023 0922   CREATININE 0.72 04/03/2021 0000      Component Value Date/Time   CALCIUM  9.8 11/17/2023 0612   ALKPHOS 64 11/11/2023 1834   AST 28 11/11/2023 1834   AST 14 (L) 11/03/2023 0922   ALT 16 11/11/2023 1834   ALT 15 11/03/2023 0922   BILITOT 0.6 11/11/2023 1834   BILITOT 0.5 11/03/2023 9077       RADIOGRAPHIC STUDIES:  CT Angio Chest PE W and/or Wo Contrast Result Date: 11/11/2023 CLINICAL DATA:  Clemens 2 days ago with head and neck trauma and midline neck tenderness. Reports taking blood thinners. Increased leg weakness and swelling. Pulmonary embolism suspected. Prior history of lung cancer. EXAM: CT HEAD WITHOUT CONTRAST CT CERVICAL SPINE WITHOUT CONTRAST CT CHEST, ABDOMEN AND PELVIS WITH CONTRAST TECHNIQUE: Contiguous axial images were obtained from the base of the skull through the vertex without intravenous contrast. Multidetector CT imaging of the cervical spine was performed without intravenous contrast. Multiplanar CT image reconstructions were also generated. Multidetector CT imaging of the chest was performed following the standard protocol during bolus administration of intravenous contrast. Multiplanar CT image reconstructions and MIPS were obtained to evaluate the vascular anatomy. RADIATION DOSE REDUCTION: This exam was performed according to the departmental dose-optimization program which includes automated exposure control,  adjustment of the mA and/or kV according to patient size and/or use of iterative reconstruction technique. CONTRAST:  OMNIPAQUE  IOHEXOL  350 MG/ML SOLN COMPARISON:  MRI brain 10/19/2023, CTA head and neck and reconstructions 11/08/2021, soft tissue neck CT 10/18/2023, CTA chest 10/18/2023 and 10/14/2023. FINDINGS: CT HEAD FINDINGS Brain: There is moderately advanced atrophy, small-vessel disease and atrophic ventriculomegaly. Multiple tiny chronic bilateral cerebellar lacunar infarcts are again noted, small chronic lacunar infarcts in the right thalamus and right pons, and a few bilateral chronic basal ganglia lacunae are again noted. No cortical based acute infarct, hemorrhage, mass or mass effect are seen. No new midline shift. Basal cisterns are clear. Vascular: Age advanced calcific plaque both siphons. No hyperdense vessel is seen. 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 If this is still present, it is not visible  with noncontrast CT. Skull: Negative for fractures or focal lesions. Sinuses/Orbits: Negative orbits. Increased patchy opacification ethmoid air cells and increased membrane thickening both maxillary and frontal sinuses. The sphenoid sinuses clear. Again noted is a diffuse right mastoid effusion and partial opacification in the right middle ear. There is increased moderate patchy left mastoid effusion with clear left middle ear cavity. Other: None. CT CERVICAL FINDINGS Alignment: There is a straightened slightly reversed cervical lordosis. There is no listhesis. No widening of the anterior atlantodental joint. No C1-2 lateral mass offset. Skull base and vertebrae: No acute fracture is evident. No primary bone lesion or focal pathologic process. Soft tissues and spinal canal: No prevertebral fluid or swelling. No visible canal hematoma. There is trace calcification at the carotid bifurcations.  There is no thyroid  or laryngeal mass. Unremarkable parotid and submandibular glands. Disc levels: The C5-6 disc is degenerative with small bidirectional osteophytes. The other discs are normal in heights. No significant soft tissue or bony encroachment on the thecal sac and cord is seen. There are slight facet spurring changes at most levels, uncinate spurring at C5-6. Foraminal stenosis is only seen at C5-6 where it is mild. The other foramina appear to be patent. Small anterior endplate spurring is seen at C3-4, C4-5 and C6-7 but no bulky osteophytes or bridging bone. Other:  None. CT CHEST FINDINGS Cardiovascular: There are extensive mediastinal, anterior chest wall and paraspinal venous collateral vessels filling with dense contrast. 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 The SVC was better demonstrated on last study due to the right-sided injection on that exam. Extensive but nonocclusive thrombus was previously noted in the right axillosubclavian vein and could still be present, but this is not evaluated due to the left-sided injection today. The distal SVC and cavoatrial junction, and the cardiac chambers opacify well. Nonocclusive thrombus in the azygous vein is also again noted. Partially occlusive thrombus also appears to still be present within an engorged right internal mammary vein on series 8 images 40-54. The heart is slightly enlarged, with left ventricular and septal hypertrophy and patchy calcification of the mitral ring. There is no enlargement of the right chambers. Small pericardial effusion is again noted. There are normal caliber pulmonary arteries, adequately opacified to the segmental level with no evidence of arterial embolism, but the subsegmental arteries largely unopacified and not evaluated. There are moderate patchy calcific plaques in the aorta and  proximal great vessels skull with heavy calcifications and thickening of the aortic valve leaflets without aortic aneurysm, dissection or stenosis. There are scattered three-vessel coronary artery calcific plaques. The pulmonary veins are normal in caliber. Mediastinum/Nodes: Stable prominent right mid hilar lymph node of 1.4 cm short axis on 8:54. Right paratracheal area is difficult to assess due to the engorged thrombosed SVC but no new abnormality is seen in the area. I do not see further adenopathy. Lungs/Pleura: Interval new small layering right pleural effusion. No left pleural effusion. Right upper lobe irregularly thick-walled cavitary lesion posteriorly again noted measuring 5.1 x 4.1 cm, not notably changed in size but with increased fluid now almost completely filling the cavity. Infectious complication not excluded. There are scarring changes, mild volume loss in the right middle lobe. Stable 7 mm ground-glass right upper lobe nodule on 10:66. Remaining lungs are generally clear. Upper abdomen: No acute abnormality. Musculoskeletal: No regional bone metastases. Review of the MIP images confirms  the above findings. IMPRESSION: 1. No acute intracranial CT findings or depressed skull fractures. 2. Atrophy, small-vessel disease and multiple chronic lacunar infarcts. 3. Sinus disease with increased membrane thickening in the frontal and maxillary sinuses, increased patchy opacification of the ethmoid air cells. 4. Persistent right mastoid effusion and partial opacification of the right middle ear. Interval increased left mastoid effusion. 5. No evidence of cervical fractures or malalignment. 6. C5-6 degenerative disc disease and spondylosis with mild foraminal stenosis. 7. No evidence of pulmonary arterial dilatation or embolus through the segmental level. Subsegmental arteries are largely unopacified and not evaluated. 8. 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 9. Not well evaluated, there may still be nonocclusive thrombus in the right axillosubclavian vein and azygous vein, and there is partially occlusive thrombus in the right internal mammary vein. 10. Interval new small layering right pleural effusion. 11. 5.1 x 4.1 cm right upper lobe cavitary mass is now almost fully filled with fluid. Infectious complication not excluded. 12. Stable 7 mm ground-glass right upper lobe nodule. 13. Stable prominent right mid hilar lymph node. 14. Aortic and coronary artery atherosclerosis. Heavily calcified thickened aortic valve leaflets. Aortic Atherosclerosis (ICD10-I70.0). Electronically Signed   By: Francis Quam M.D.   On: 11/11/2023 20:59   CT Head Wo Contrast Result Date: 11/11/2023 CLINICAL DATA:  Clemens 2 days ago with head and neck trauma and midline neck tenderness. Reports taking blood thinners. Increased leg weakness and swelling. Pulmonary embolism suspected. Prior history of lung cancer. EXAM: CT HEAD WITHOUT CONTRAST CT CERVICAL SPINE WITHOUT CONTRAST CT CHEST, ABDOMEN AND PELVIS WITH CONTRAST TECHNIQUE: Contiguous axial images were obtained from the base of the skull through the vertex without intravenous contrast. Multidetector CT imaging of the cervical spine was performed without intravenous contrast. Multiplanar CT image reconstructions were also generated. Multidetector CT imaging of the chest was performed following the standard protocol during bolus administration of intravenous contrast. Multiplanar CT image reconstructions and MIPS were obtained to evaluate the vascular anatomy. RADIATION DOSE REDUCTION: This exam was performed according to the departmental dose-optimization program which includes automated exposure control, adjustment of the mA and/or kV according to patient size and/or use of iterative reconstruction technique. CONTRAST:   OMNIPAQUE  IOHEXOL  350 MG/ML SOLN COMPARISON:  MRI brain 10/19/2023, CTA head and neck and reconstructions 11/08/2021, soft tissue neck CT 10/18/2023, CTA chest 10/18/2023 and 10/14/2023. FINDINGS: CT HEAD FINDINGS Brain: There is moderately advanced atrophy, small-vessel disease and atrophic ventriculomegaly. Multiple tiny chronic bilateral cerebellar lacunar infarcts are again noted, small chronic lacunar infarcts in the right thalamus and right pons, and a few bilateral chronic basal ganglia lacunae are again noted. No cortical based acute infarct, hemorrhage, mass or mass effect are seen. No new midline shift. Basal cisterns are clear. Vascular: Age advanced calcific plaque both siphons. No hyperdense vessel is seen. 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 If this is still present, it is not visible with noncontrast CT. Skull: Negative for fractures or focal lesions. Sinuses/Orbits: Negative orbits. Increased patchy opacification ethmoid air cells and increased membrane thickening both maxillary and frontal sinuses. The sphenoid sinuses clear. Again noted is a diffuse right mastoid effusion and partial opacification in the right middle ear. There is increased moderate patchy left mastoid effusion with clear left middle ear cavity. Other: None.  CT CERVICAL FINDINGS Alignment: There is a straightened slightly reversed cervical lordosis. There is no listhesis. No widening of the anterior atlantodental joint. No C1-2 lateral mass offset. Skull base and vertebrae: No acute fracture is evident. No primary bone lesion or focal pathologic process. Soft tissues and spinal canal: No prevertebral fluid or swelling. No visible canal hematoma. There is trace calcification at the carotid bifurcations. There is no thyroid  or laryngeal mass. Unremarkable parotid and submandibular glands. Disc levels: The C5-6 disc is  degenerative with small bidirectional osteophytes. The other discs are normal in heights. No significant soft tissue or bony encroachment on the thecal sac and cord is seen. There are slight facet spurring changes at most levels, uncinate spurring at C5-6. Foraminal stenosis is only seen at C5-6 where it is mild. The other foramina appear to be patent. Small anterior endplate spurring is seen at C3-4, C4-5 and C6-7 but no bulky osteophytes or bridging bone. Other:  None. CT CHEST FINDINGS Cardiovascular: There are extensive mediastinal, anterior chest wall and paraspinal venous collateral vessels filling with dense contrast. 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 The SVC was better demonstrated on last study due to the right-sided injection on that exam. Extensive but nonocclusive thrombus was previously noted in the right axillosubclavian vein and could still be present, but this is not evaluated due to the left-sided injection today. The distal SVC and cavoatrial junction, and the cardiac chambers opacify well. Nonocclusive thrombus in the azygous vein is also again noted. Partially occlusive thrombus also appears to still be present within an engorged right internal mammary vein on series 8 images 40-54. The heart is slightly enlarged, with left ventricular and septal hypertrophy and patchy calcification of the mitral ring. There is no enlargement of the right chambers. Small pericardial effusion is again noted. There are normal caliber pulmonary arteries, adequately opacified to the segmental level with no evidence of arterial embolism, but the subsegmental arteries largely unopacified and not evaluated. There are moderate patchy calcific plaques in the aorta and proximal great vessels skull with heavy calcifications and thickening of the aortic valve leaflets without aortic  aneurysm, dissection or stenosis. There are scattered three-vessel coronary artery calcific plaques. The pulmonary veins are normal in caliber. Mediastinum/Nodes: Stable prominent right mid hilar lymph node of 1.4 cm short axis on 8:54. Right paratracheal area is difficult to assess due to the engorged thrombosed SVC but no new abnormality is seen in the area. I do not see further adenopathy. Lungs/Pleura: Interval new small layering right pleural effusion. No left pleural effusion. Right upper lobe irregularly thick-walled cavitary lesion posteriorly again noted measuring 5.1 x 4.1 cm, not notably changed in size but with increased fluid now almost completely filling the cavity. Infectious complication not excluded. There are scarring changes, mild volume loss in the right middle lobe. Stable 7 mm ground-glass right upper lobe nodule on 10:66. Remaining lungs are generally clear. Upper abdomen: No acute abnormality. Musculoskeletal: No regional bone metastases. Review of the MIP images confirms the above findings. IMPRESSION: 1. No acute intracranial CT findings or depressed skull fractures. 2. Atrophy, small-vessel disease and multiple chronic lacunar infarcts. 3. Sinus disease with increased membrane thickening in the frontal and maxillary sinuses, increased patchy opacification of the ethmoid air cells. 4. Persistent right mastoid effusion and partial opacification of the right middle ear. Interval increased left mastoid effusion. 5. No  evidence of cervical fractures or malalignment. 6. C5-6 degenerative disc disease and spondylosis with mild foraminal stenosis. 7. No evidence of pulmonary arterial dilatation or embolus through the segmental level. Subsegmental arteries are largely unopacified and not evaluated. 8. 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 9. Not well evaluated, there may still be nonocclusive thrombus in the right axillosubclavian vein and azygous vein, and there is partially occlusive thrombus in the right internal mammary vein. 10. Interval new small layering right pleural effusion. 11. 5.1 x 4.1 cm right upper lobe cavitary mass is now almost fully filled with fluid. Infectious complication not excluded. 12. Stable 7 mm ground-glass right upper lobe nodule. 13. Stable prominent right mid hilar lymph node. 14. Aortic and coronary artery atherosclerosis. Heavily calcified thickened aortic valve leaflets. Aortic Atherosclerosis (ICD10-I70.0). Electronically Signed   By: Francis Quam M.D.   On: 11/11/2023 20:59   CT Cervical Spine Wo Contrast Result Date: 11/11/2023 CLINICAL DATA:  Clemens 2 days ago with head and neck trauma and midline neck tenderness. Reports taking blood thinners. Increased leg weakness and swelling. Pulmonary embolism suspected. Prior history of lung cancer. EXAM: CT HEAD WITHOUT CONTRAST CT CERVICAL SPINE WITHOUT CONTRAST CT CHEST, ABDOMEN AND PELVIS WITH CONTRAST TECHNIQUE: Contiguous axial images were obtained from the base of the skull through the vertex without intravenous contrast. Multidetector CT imaging of the cervical spine was performed without intravenous contrast. Multiplanar CT image reconstructions were also generated. Multidetector CT imaging of the chest was performed following the standard protocol during bolus administration of intravenous contrast. Multiplanar CT image reconstructions and MIPS were obtained to evaluate the vascular anatomy. RADIATION DOSE REDUCTION: This exam was performed according to the departmental dose-optimization program which includes automated exposure control, adjustment of the mA and/or kV according to patient size and/or use of iterative reconstruction technique. CONTRAST:  OMNIPAQUE  IOHEXOL  350 MG/ML SOLN COMPARISON:  MRI brain 10/19/2023, CTA head and neck and  reconstructions 11/08/2021, soft tissue neck CT 10/18/2023, CTA chest 10/18/2023 and 10/14/2023. FINDINGS: CT HEAD FINDINGS Brain: There is moderately advanced atrophy, small-vessel disease and atrophic ventriculomegaly. Multiple tiny chronic bilateral cerebellar lacunar infarcts are again noted, small chronic lacunar infarcts in the right thalamus and right pons, and a few bilateral chronic basal ganglia lacunae are again noted. No cortical based acute infarct, hemorrhage, mass or mass effect are seen. No new midline shift. Basal cisterns are clear. Vascular: Age advanced calcific plaque both siphons. No hyperdense vessel is seen. 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 If this is still present, it is not visible with noncontrast CT. Skull: Negative for fractures or focal lesions. Sinuses/Orbits: Negative orbits. Increased patchy opacification ethmoid air cells and increased membrane thickening both maxillary and frontal sinuses. The sphenoid sinuses clear. Again noted is a diffuse right mastoid effusion and partial opacification in the right middle ear. There is increased moderate patchy left mastoid effusion with clear left middle ear cavity. Other: None. CT CERVICAL FINDINGS Alignment: There is a straightened slightly reversed cervical lordosis. There is no listhesis. No widening of the anterior atlantodental joint. No C1-2 lateral mass offset. Skull base and vertebrae: No acute fracture is evident. No primary bone lesion or focal pathologic process. Soft tissues and spinal canal: No prevertebral fluid or swelling. No visible canal hematoma. There is trace calcification  at the carotid bifurcations. There is no thyroid  or laryngeal mass. Unremarkable parotid and submandibular glands. Disc levels: The C5-6 disc is degenerative with small bidirectional osteophytes. The other discs are normal in heights. No  significant soft tissue or bony encroachment on the thecal sac and cord is seen. There are slight facet spurring changes at most levels, uncinate spurring at C5-6. Foraminal stenosis is only seen at C5-6 where it is mild. The other foramina appear to be patent. Small anterior endplate spurring is seen at C3-4, C4-5 and C6-7 but no bulky osteophytes or bridging bone. Other:  None. CT CHEST FINDINGS Cardiovascular: There are extensive mediastinal, anterior chest wall and paraspinal venous collateral vessels filling with dense contrast. 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 The SVC was better demonstrated on last study due to the right-sided injection on that exam. Extensive but nonocclusive thrombus was previously noted in the right axillosubclavian vein and could still be present, but this is not evaluated due to the left-sided injection today. The distal SVC and cavoatrial junction, and the cardiac chambers opacify well. Nonocclusive thrombus in the azygous vein is also again noted. Partially occlusive thrombus also appears to still be present within an engorged right internal mammary vein on series 8 images 40-54. The heart is slightly enlarged, with left ventricular and septal hypertrophy and patchy calcification of the mitral ring. There is no enlargement of the right chambers. Small pericardial effusion is again noted. There are normal caliber pulmonary arteries, adequately opacified to the segmental level with no evidence of arterial embolism, but the subsegmental arteries largely unopacified and not evaluated. There are moderate patchy calcific plaques in the aorta and proximal great vessels skull with heavy calcifications and thickening of the aortic valve leaflets without aortic aneurysm, dissection or stenosis. There are scattered three-vessel coronary artery calcific plaques.  The pulmonary veins are normal in caliber. Mediastinum/Nodes: Stable prominent right mid hilar lymph node of 1.4 cm short axis on 8:54. Right paratracheal area is difficult to assess due to the engorged thrombosed SVC but no new abnormality is seen in the area. I do not see further adenopathy. Lungs/Pleura: Interval new small layering right pleural effusion. No left pleural effusion. Right upper lobe irregularly thick-walled cavitary lesion posteriorly again noted measuring 5.1 x 4.1 cm, not notably changed in size but with increased fluid now almost completely filling the cavity. Infectious complication not excluded. There are scarring changes, mild volume loss in the right middle lobe. Stable 7 mm ground-glass right upper lobe nodule on 10:66. Remaining lungs are generally clear. Upper abdomen: No acute abnormality. Musculoskeletal: No regional bone metastases. Review of the MIP images confirms the above findings. IMPRESSION: 1. No acute intracranial CT findings or depressed skull fractures. 2. Atrophy, small-vessel disease and multiple chronic lacunar infarcts. 3. Sinus disease with increased membrane thickening in the frontal and maxillary sinuses, increased patchy opacification of the ethmoid air cells. 4. Persistent right mastoid effusion and partial opacification of the right middle ear. Interval increased left mastoid effusion. 5. No evidence of cervical fractures or malalignment. 6. C5-6 degenerative disc disease and spondylosis with mild foraminal stenosis. 7. No evidence of pulmonary arterial dilatation or embolus through the segmental level. Subsegmental arteries are largely unopacified and not evaluated. 8. 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 9. Not well evaluated, there may still be nonocclusive thrombus in the right axillosubclavian vein  and azygous vein, and there is partially occlusive thrombus in the right internal mammary vein. 10. Interval new small layering right pleural effusion. 11. 5.1 x 4.1 cm right upper lobe cavitary mass is now almost fully filled with fluid. Infectious complication not excluded. 12. Stable 7 mm ground-glass right upper lobe nodule. 13. Stable prominent right mid hilar lymph node. 14. Aortic and coronary artery atherosclerosis. Heavily calcified thickened aortic valve leaflets. Aortic Atherosclerosis (ICD10-I70.0). Electronically Signed   By: Francis Quam M.D.   On: 11/11/2023 20:59   US  Venous Img Lower Right (DVT Study) Result Date: 11/11/2023 EXAM: ULTRASOUND DUPLEX OF THE RIGHT LOWER EXTREMITY VEINS TECHNIQUE: Duplex ultrasound using B-mode/gray scaled imaging and Doppler spectral analysis and color flow was obtained of the deep venous structures of the right lower extremity. COMPARISON: None. CLINICAL HISTORY: Swelling, lung cancer. FINDINGS: The common femoral vein, femoral vein, popliteal vein, and posterior tibial vein of the right lower extremity demonstrate normal compressibility with normal color flow and spectral analysis. IMPRESSION: 1. No evidence of DVT. 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  He was found to have a thick-walled cavitary lesion in the right upper lobe, mediastinal, and hilar lymphadenopathy.  She has non-specific prominent subcentimeter bilateral supraclavicular lymph nodes. She was diagnosed in September 2025.  She does have a history of small cell lung cancer in 2007 treated with chemo and radiation and PCI at [facility].   Insufficient material for PDL1 expression. ***moleculars ***    Dr. Sherrod recommend treatment with  concurrent chemoradiation with weekly carboplatin for an AUC of 2 and paclitaxel 45 mg/m.  She is expected to start on 12/06/23. She is expected to have similation for radiation ***.   The patient was seen with Dr. Sherrod today.  Dr. Sherrod personally and independently reviewed the PET scan and discussed results with the patient today.  The scan showed ***.  Dr. Sherrod recommends ***  We will see her in 2 weeks with cycle #2.   ***make sure she had chemo ed  She will continue with eliquis .   She will follow with cardiology for the aortic stenosis and pulmonary medicine.   ***Anemia  The patient was advised to call immediately if she has any concerning symptoms in the interval. The patient voices understanding of current disease status and treatment options and is in agreement with the current care plan. All questions were answered. The patient knows to call the clinic with any problems, questions or concerns. We can certainly see the patient much sooner if necessary   No orders of the defined types were placed in this encounter.    I spent {CHL ONC TIME VISIT - DTPQU:[id number]} counseling the patient face to face. The total time spent in the appointment was {CHL ONC TIME VISIT - DTPQU:8845999869}.  Wakisha Alberts L Quandarius Nill, PA-C 11/28/23

## 2023-11-29 ENCOUNTER — Encounter (HOSPITAL_COMMUNITY)
Admission: RE | Admit: 2023-11-29 | Discharge: 2023-11-29 | Disposition: A | Discharge status: Home / Self Care | Source: Ambulatory Visit | Attending: Physician Assistant | Admitting: Physician Assistant

## 2023-11-29 ENCOUNTER — Encounter: Payer: Self-pay | Admitting: Internal Medicine

## 2023-11-29 ENCOUNTER — Other Ambulatory Visit: Payer: Self-pay | Admitting: Physician Assistant

## 2023-11-29 ENCOUNTER — Other Ambulatory Visit (HOSPITAL_BASED_OUTPATIENT_CLINIC_OR_DEPARTMENT_OTHER): Payer: Self-pay

## 2023-11-29 ENCOUNTER — Telehealth: Payer: Self-pay | Admitting: Internal Medicine

## 2023-11-29 DIAGNOSIS — C3490 Malignant neoplasm of unspecified part of unspecified bronchus or lung: Secondary | ICD-10-CM | POA: Insufficient documentation

## 2023-11-29 DIAGNOSIS — C3491 Malignant neoplasm of unspecified part of right bronchus or lung: Secondary | ICD-10-CM | POA: Diagnosis present

## 2023-11-29 LAB — GLUCOSE, CAPILLARY: Glucose-Capillary: 103 mg/dL — ABNORMAL HIGH (ref 70–99)

## 2023-11-29 MED ORDER — FLUDEOXYGLUCOSE F - 18 (FDG) INJECTION
8.6900 | Freq: Once | INTRAVENOUS | Status: AC | PRN
  Administered 2023-11-29: 8.69 via INTRAVENOUS

## 2023-11-29 NOTE — Progress Notes (Signed)
 NN spoke to pt's dtrs, [place] and [employer]. NN wanted to verify pt's current location (home or rehab facility) [place] states the pt went home. NN wanted to let [place] know that the medical oncology appts for 10/21 would be cancelled so the pt only had to be at the [facility] for her 2pm Rad Onc appts.  [place] conferenced in [place] on the phone to inform her of the plan for pt's appts. No questions at this time.   NN received noticed from [employer] that pt's dtr, [name], call and left a VM concerning pt's scheduled appts. NN called [name] who states that no one is running the appts by her. [name] is the primary caregiver for the pt who lives with her and takes her to her appts. [name] states she will have to take her dtr out of school early in order to get the pt to her appt at 2pm. NN notified [name] that there is a note in pts chart that a scheduler spoke to pts daughter on 10/17 to notify them of the pts appt schedule on 10/21. NN asked [name] if 11am on 10/23 is an acceptable time for the pt's chemo education appt. [name] confirms that that appt time will work. [name] states she will get one of her sisters to go to stay with the pt for her Rad Onc appts so she can pick up her dtr. NN apologized for the misunderstanding.

## 2023-11-29 NOTE — Progress Notes (Signed)
 Location of tumor and Histology per Pathology Report: Non Small Cell Cancer of Right Lung       Past/Anticipated interventions by medical oncology, if any:   Hematology/Oncology Attending: I had a face-to-face encounter with the patient today.  I reviewed her records, lab, scan and recommended her care plan.  This is a 58 years old African-American female with past medical history significant for multiple comorbidities including history of right lower lobe small cell lung cancer treated at [facility] in 2007 with a course of concurrent chemoradiation followed by prophylactic cranial irradiation.  She also has a history of a stroke, NSTEMI, hypertension, aortic stenosis, superior vena cava thrombus as well as dyslipidemia for breast-feeding and polysubstance abuse including tobacco abuse.  The patient was seen recently in the hospital on 10/18/2023 complaining of shortness of breath and chest pain.  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  She is currently on Eliquis  for this condition and managed by neurology and her primary care physician.  During her evaluation she had CT angiogram that incidentally showed 4.4 x 4.8 x 4.6 cm thick-walled cavitary lesion in the posterior right upper lobe with mild right hilar lymphadenopathy.  Her previous CT angiogram on 10/14/2023 showed the same mass measuring 5.7 x 5.3 cm.  There was also suspicious prominent subcentimeter bilateral supraclavicular lymph nodes on that scan.  During her hospitalization she was seen by pulmonary medicine and she underwent bronchoscopy by Dr. Jude on 10/22/2023 and the final pathology from the 4R lymph node was consistent with non-small cell lung cancer, squamous cell carcinoma. The patient continues to complain of fatigue and weakness and sleeping most of the time.  She has a long history of smoking and would like to continue to smoke but her daughter has been working on helping  her on smoking cessation. I had a lengthy discussion with the patient and her daughter today about her current condition and treatment options.  The patient has stage IIIb (T3, N2, M0) non-small cell lung cancer, squamous cell carcinoma presented with cavitary right upper lobe lung mass in addition to mediastinal lymphadenopathy and there is concern also about possibility of bilateral supraclavicular lymph nodes pending a PET scan for staging workup. I recommended for the patient to have the PET scan to complete the staging workup. I recommended for her course of concurrent chemoradiation with weekly carboplatin for AUC of 2 and paclitaxel 45 MGs/M2.  She is expected to start the first dose of this treatment on November 15, 2023. I discussed with the patient the adverse effect of this treatment including but not limited to alopecia myelosuppression, nausea and vomiting, peripheral neuropathy, liver or renal dysfunction.  We will arrange for the patient to have a chemotherapy education class before the first dose of her treatment. Will also refer her to radiation oncology for discussion of the radiotherapy portion of this treatment.  She is also expected to receive consolidation treatment with immunotherapy with durvalumab after completion of the induction phase if she has no evidence for disease progression. Will also arrange for the patient to have Port-A-Cath placed for the treatment. She will come back for follow-up visit with the first dose of her treatment. The patient was advised to call immediately if she has any concerning symptoms in the interval. The total time spent in the appointment was 90 minutes including review of chart and various tests results, discussions about plan of care and  coordination of care plan . Disclaimer: This note was dictated with voice recognition software. Similar sounding words can inadvertently be transcribed and may be missed upon review. Sherrod MARLA Sherrod, MD           Electronically signed by Sherrod Sherrod, MD at 11/03/2023  9:24 PM   Pain issues, if any:  {:18581} {PAIN DESCRIPTION:21022940} Weight changes, if any: {:18581}  SAFETY ISSUES: Prior radiation? {:18581} Pacemaker/ICD? {:18581} Possible current pregnancy? no Is the patient on methotrexate? {:18581}  Current Complaints / other details:  ***     ***

## 2023-11-29 NOTE — Telephone Encounter (Signed)
 Left the patient a voicemail with the rescheduled patient education appointment details.

## 2023-11-30 ENCOUNTER — Inpatient Hospital Stay: Admitting: Physician Assistant

## 2023-11-30 ENCOUNTER — Encounter: Payer: Self-pay | Admitting: Radiology

## 2023-11-30 ENCOUNTER — Other Ambulatory Visit

## 2023-11-30 ENCOUNTER — Inpatient Hospital Stay

## 2023-11-30 ENCOUNTER — Ambulatory Visit
Admission: RE | Admit: 2023-11-30 | Discharge: 2023-11-30 | Disposition: A | Discharge status: Home / Self Care | Source: Ambulatory Visit | Attending: Radiation Oncology | Admitting: Radiation Oncology

## 2023-11-30 ENCOUNTER — Ambulatory Visit
Admission: RE | Admit: 2023-11-30 | Discharge: 2023-11-30 | Disposition: A | Discharge status: Home / Self Care | Source: Ambulatory Visit | Attending: Radiology | Admitting: Radiology

## 2023-11-30 VITALS — BP 142/82 | HR 113 | Resp 18

## 2023-11-30 DIAGNOSIS — C3411 Malignant neoplasm of upper lobe, right bronchus or lung: Secondary | ICD-10-CM

## 2023-11-30 DIAGNOSIS — Z510 Encounter for antineoplastic radiation therapy: Secondary | ICD-10-CM | POA: Insufficient documentation

## 2023-11-30 DIAGNOSIS — M50322 Other cervical disc degeneration at C5-C6 level: Secondary | ICD-10-CM | POA: Diagnosis not present

## 2023-11-30 DIAGNOSIS — M4802 Spinal stenosis, cervical region: Secondary | ICD-10-CM | POA: Diagnosis not present

## 2023-11-30 DIAGNOSIS — J90 Pleural effusion, not elsewhere classified: Secondary | ICD-10-CM | POA: Diagnosis not present

## 2023-11-30 DIAGNOSIS — Z79899 Other long term (current) drug therapy: Secondary | ICD-10-CM | POA: Diagnosis not present

## 2023-11-30 DIAGNOSIS — I700 Atherosclerosis of aorta: Secondary | ICD-10-CM | POA: Insufficient documentation

## 2023-11-30 DIAGNOSIS — I2510 Atherosclerotic heart disease of native coronary artery without angina pectoris: Secondary | ICD-10-CM | POA: Diagnosis not present

## 2023-11-30 DIAGNOSIS — Z9221 Personal history of antineoplastic chemotherapy: Secondary | ICD-10-CM | POA: Diagnosis not present

## 2023-11-30 DIAGNOSIS — M2578 Osteophyte, vertebrae: Secondary | ICD-10-CM | POA: Diagnosis not present

## 2023-11-30 DIAGNOSIS — Z923 Personal history of irradiation: Secondary | ICD-10-CM | POA: Insufficient documentation

## 2023-11-30 DIAGNOSIS — M47812 Spondylosis without myelopathy or radiculopathy, cervical region: Secondary | ICD-10-CM | POA: Diagnosis not present

## 2023-11-30 DIAGNOSIS — I517 Cardiomegaly: Secondary | ICD-10-CM | POA: Insufficient documentation

## 2023-11-30 DIAGNOSIS — I3139 Other pericardial effusion (noninflammatory): Secondary | ICD-10-CM | POA: Insufficient documentation

## 2023-11-30 DIAGNOSIS — Z8673 Personal history of transient ischemic attack (TIA), and cerebral infarction without residual deficits: Secondary | ICD-10-CM | POA: Diagnosis not present

## 2023-11-30 DIAGNOSIS — Z7901 Long term (current) use of anticoagulants: Secondary | ICD-10-CM | POA: Diagnosis not present

## 2023-11-30 MED ORDER — SODIUM CHLORIDE 0.9% FLUSH
10.0000 mL | Freq: Once | INTRAVENOUS | Status: AC
  Administered 2023-11-30: 10 mL via INTRAVENOUS

## 2023-11-30 NOTE — Progress Notes (Signed)
 Radiation Oncology         [phone]) [phone] ________________________________  Name: Andrea Mullen MRN: [id number]  Date: 11/30/2023  DOB: 03-10-1965  Re-Evaluation Note  CC: [facility], [facility]  Sherrod Sherrod, MD  No diagnosis found.  Diagnosis:  ***  Narrative:  The patient returns today to discuss radiation treatment options. She was seen by Dr. Izell while inpatient on 11/16/2023.  Patient is a 58 y.o. 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 She was treated with chemotherapy and radiation therapy at [facility]. She had also established care with Dr. Shelah on October of 2023 due to an abnormal chest CT on 11/09/21 showing a solid 10 mm right upper lobe pulmonary nodule, a 5 mm ground-glass right upper lobe nodule, and subpleural linear opacities in the right upper lobe consistent with scaring. Follow up imaging was recommended at that time which was never performed.    Pertaining to her present recurrence, she presented to the ED on 10/14/23 with SOB and a productive cough. A CTA of the chest was performed in this setting which revealed a right upper lobe mass measuring approximately 5.7 x 5.3 cm (at the site of the previously demonstrated small RUL pulmonary nodule on her prior chest CT from October 2023), mildly prominent sub-centimeter bilateral supraclavicular lymph nodes, and bulky mediastinal lymphadenopathy measuring up to 3.1 x 2.5 cm, concerning for primary lung malignancy. Further work-up was recommended, and she was discharged with instructions to pursue OP follow-up.   She presented to the ED on 10/18/2023 with complaints of persistent SOB and chest pain. 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 She was placed on a heparin  drip and transitioned to PO Eliquis . A CTA of the chest was also performed upon admission which redemonstrated the  4.4 cm x 4.8 cm x 4.6 cm thick walled cavitary lesion within the posterior aspect of the RUL, and mild right hilar lymphadenopathy. Pulmonary medicine was consulted and she was cleared to undergo and bronchoscopy with EBUS on 10/22/23 under the care of Dr. Jude. FNA of a 4R lymph node was collected at that time and showed non small cell carcinoma, consistent with squamous cell carcinoma. RUL lavage and brushings were also submitted for cytology; both showed no evidence of malignancy.   After being discharged, she established care with Dr. Sherrod on 11/03/23. Dr. Sherrod had recommended concurrent chemoradiation.   Patient presented to the ED with generalized weakness and falls. CT head showed no acute intracranial abnormality. CT cervical spine showed no fractures. 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 She was started on IV antibiotics. The patient was ultimately discharged on 11/18/2023 to a SNF to help with failure to thrive.  PET imaging on 11/29/2023 demonstrates ***.   I called the patient on 11/23/2023 to review radiation treatment. Patient's daughter and the patient expressed interest in proceeding with treatment at that time. She is seen today to discuss radiation treatment in detail today.  On review of systems, the patient reports ***. She denies *** and any other symptoms.     Allergies:  is allergic to bicillin c-r, penicillins, nsaids,  and prednisone .  Meds: Current Outpatient Medications  Medication Sig Dispense Refill   albuterol  (VENTOLIN  HFA) 108 (90 Base) MCG/ACT inhaler Inhale 2 puffs into the lungs every 4 (four) hours as needed for wheezing  or shortness of breath. 18 g 0   albuterol  (VENTOLIN  HFA) 108 (90 Base) MCG/ACT inhaler Inhale 2 puffs into the lungs every 6 (six) hours as needed for wheezing or shortness of breath. 8.5 g 2   apixaban  (ELIQUIS ) 5 MG TABS tablet Take 1 tablet (5 mg total) by mouth 2 (two) times daily. 60 tablet 0   cefadroxil (DURICEF) 500 MG capsule Take 1 capsule (500 mg total) by mouth 2 (two) times daily for 14 days. 28 capsule 0   cyclobenzaprine  (FLEXERIL ) 5 MG tablet Take 1 tablet (5 mg total) by mouth 3 (three) times daily as needed for muscle spasms. 30 tablet 0   HYDROcodone -acetaminophen  (NORCO/VICODIN) 5-325 MG tablet Take 1 tablet by mouth every 6 (six) hours as needed for moderate pain (pain score 4-6). 20 tablet 0   lidocaine -prilocaine  (EMLA ) cream Apply to affected area once 30 g 3   metroNIDAZOLE  (FLAGYL ) 500 MG tablet Take 1 tablet (500 mg total) by mouth 2 (two) times daily for 14 days. 28 tablet 0   Nystatin (GERHARDT'S BUTT CREAM) CREA Apply 1 Application topically 3 (three) times daily. Apply to affected area 60 each 3   ondansetron  (ZOFRAN ) 8 MG tablet Take 1 tablet (8 mg total) by mouth every 8 (eight) hours as needed for nausea or vomiting. Start on the third day after chemotherapy. 30 tablet 1   prochlorperazine  (COMPAZINE ) 10 MG tablet Take 1 tablet (10 mg total) by mouth every 6 (six) hours as needed. 30 tablet 2   senna-docusate (SENOKOT-S) 8.6-50 MG tablet Take 1 tablet by mouth 2 (two) times daily. 60 tablet 0   albuterol  (PROVENTIL ) (2.5 MG/3ML) 0.083% nebulizer solution Take 3 mLs (2.5 mg total) by nebulization every 6 (six) hours as needed for wheezing or shortness of breath. (Patient not taking: Reported on 11/30/2023) 75 mL 12   polyethylene glycol powder (GLYCOLAX /MIRALAX ) 17 GM/SCOOP powder Take 17 g by mouth 2 (two) times daily as needed for mild constipation or moderate constipation. Dissolve 1 capful (17g) in 4-8 ounces of liquid and take by mouth daily. (Patient not taking:  Reported on 11/30/2023) 238 g 0   rosuvastatin  (CRESTOR ) 40 MG tablet Take 1 tablet (40 mg total) by mouth daily. (Patient not taking: Reported on 11/30/2023) 30 tablet 3   No current facility-administered medications for this encounter.    Physical Findings: The patient is in no acute distress. Patient is alert and oriented.  blood pressure is 142/82 (abnormal) and her pulse is 113 (abnormal). Her respiration is 18 and oxygen saturation is 100%.  No significant changes. Lungs are clear to auscultation bilaterally. Heart has regular rate and rhythm. No palpable cervical, supraclavicular, or axillary adenopathy. Abdomen soft, non-tender, normal bowel sounds. ***  Lab Findings: Lab Results  Component Value Date   WBC 4.7 11/17/2023   HGB 10.2 (L) 11/17/2023   HCT 33.0 (L) 11/17/2023   MCV 87.5 11/17/2023   PLT 197 11/17/2023    Radiographic Findings: NM PET Image Initial (PI) Skull Base To Thigh Result Date: 11/30/2023 CLINICAL DATA:  Initial treatment strategy for non-small cell lung cancer. EXAM: NUCLEAR MEDICINE PET SKULL BASE TO THIGH TECHNIQUE: 8.69 mCi F-18 FDG was injected intravenously. Full-ring PET imaging was performed from the skull base to thigh after the radiotracer. CT  data was obtained and used for attenuation correction and anatomic localization. Fasting blood glucose: 103 mg/dl COMPARISON:  Chest CTA 11/11/2023 and 10/18/2023. Abdominopelvic CT 10/25/2023. Chest CT 11/09/2021 FINDINGS: Mediastinal blood pool activity: SUV max 2.5 NECK: No hypermetabolic cervical lymph nodes are identified. No suspicious activity identified within the pharyngeal mucosal space. Incidental CT findings: none CHEST: Centrally necrotic right paratracheal lymph node measures 3.2 x 3.1 cm on image 49/4 demonstrates moderate peripheral hypermetabolic activity (SUV max 9.6). No other enlarged or hypermetabolic mediastinal, hilar or axillary lymph nodes. A centrally cavitary mass posteriorly in the right  upper lobe measures 6.2 x 5.3 cm on image 48/4 and demonstrates peripheral hypermetabolic activity with an SUV max of 14.2. Stable 6 mm ground-glass nodule in the right middle lobe on image 36/7, too small to evaluate by PET-CT. No hypermetabolic pulmonary activity or suspicious left lung nodularity. Incidental CT findings: Atherosclerosis of the aorta, great vessels and coronary arteries. Dense calcifications of the aortic valve. No significant changes small dependent right pleural effusion without associated hypermetabolic activity. ABDOMEN/PELVIS: There is no hypermetabolic activity within the liver, adrenal glands, spleen or pancreas. There is no hypermetabolic nodal activity in the abdomen or pelvis. Incidental CT findings: Aortic and branch vessel atherosclerosis. Multiple partially calcified uterine fibroids. Postsurgical changes from previous ventral hernia repair. There are diverticular changes within the distal colon. SKELETON: There is no hypermetabolic activity to suggest osseous metastatic disease. Incidental CT findings: Multilevel spondylosis. Unchanged probable sebaceous cyst in the lower left back measuring 4.4 cm on image 101/4, without hypermetabolic activity. IMPRESSION: 1. Hypermetabolic cavitary mass posteriorly in the right upper lobe consistent with known primary bronchogenic carcinoma. 2. Hypermetabolic centrally necrotic right paratracheal lymph node consistent with nodal metastatic disease. 3. No evidence of distant metastatic disease. 4. Stable small right pleural effusion. 5. Stable small ground-glass right middle lobe nodule, too small to evaluate by PET-CT. Recommend attention on follow-up. 6.  Aortic Atherosclerosis (ICD10-I70.0). Electronically Signed   By: Elsie Perone M.D.   On: 11/30/2023 12:06   CT Angio Chest PE W and/or Wo Contrast Result Date: 11/11/2023 CLINICAL DATA:  Clemens 2 days ago with head and neck trauma and midline neck tenderness. Reports taking blood thinners.  Increased leg weakness and swelling. Pulmonary embolism suspected. Prior history of lung cancer. EXAM: CT HEAD WITHOUT CONTRAST CT CERVICAL SPINE WITHOUT CONTRAST CT CHEST, ABDOMEN AND PELVIS WITH CONTRAST TECHNIQUE: Contiguous axial images were obtained from the base of the skull through the vertex without intravenous contrast. Multidetector CT imaging of the cervical spine was performed without intravenous contrast. Multiplanar CT image reconstructions were also generated. Multidetector CT imaging of the chest was performed following the standard protocol during bolus administration of intravenous contrast. Multiplanar CT image reconstructions and MIPS were obtained to evaluate the vascular anatomy. RADIATION DOSE REDUCTION: This exam was performed according to the departmental dose-optimization program which includes automated exposure control, adjustment of the mA and/or kV according to patient size and/or use of iterative reconstruction technique. CONTRAST:  OMNIPAQUE  IOHEXOL  350 MG/ML SOLN COMPARISON:  MRI brain 10/19/2023, CTA head and neck and reconstructions 11/08/2021, soft tissue neck CT 10/18/2023, CTA chest 10/18/2023 and 10/14/2023. FINDINGS: CT HEAD FINDINGS Brain: There is moderately advanced atrophy, small-vessel disease and atrophic ventriculomegaly. Multiple tiny chronic bilateral cerebellar lacunar infarcts are again noted, small chronic lacunar infarcts in the right thalamus and right pons, and a few bilateral chronic basal ganglia lacunae are again noted. No cortical based acute infarct, hemorrhage, mass or mass effect  are seen. No new midline shift. Basal cisterns are clear. Vascular: Age advanced calcific plaque both siphons. No hyperdense vessel is seen. 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 If this is still present, it is not visible with noncontrast CT. Skull: Negative for fractures  or focal lesions. Sinuses/Orbits: Negative orbits. Increased patchy opacification ethmoid air cells and increased membrane thickening both maxillary and frontal sinuses. The sphenoid sinuses clear. Again noted is a diffuse right mastoid effusion and partial opacification in the right middle ear. There is increased moderate patchy left mastoid effusion with clear left middle ear cavity. Other: None. CT CERVICAL FINDINGS Alignment: There is a straightened slightly reversed cervical lordosis. There is no listhesis. No widening of the anterior atlantodental joint. No C1-2 lateral mass offset. Skull base and vertebrae: No acute fracture is evident. No primary bone lesion or focal pathologic process. Soft tissues and spinal canal: No prevertebral fluid or swelling. No visible canal hematoma. There is trace calcification at the carotid bifurcations. There is no thyroid  or laryngeal mass. Unremarkable parotid and submandibular glands. Disc levels: The C5-6 disc is degenerative with small bidirectional osteophytes. The other discs are normal in heights. No significant soft tissue or bony encroachment on the thecal sac and cord is seen. There are slight facet spurring changes at most levels, uncinate spurring at C5-6. Foraminal stenosis is only seen at C5-6 where it is mild. The other foramina appear to be patent. Small anterior endplate spurring is seen at C3-4, C4-5 and C6-7 but no bulky osteophytes or bridging bone. Other:  None. CT CHEST FINDINGS Cardiovascular: There are extensive mediastinal, anterior chest wall and paraspinal venous collateral vessels filling with dense contrast. 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 The SVC was better demonstrated on last study due to the right-sided injection on that exam. Extensive but nonocclusive thrombus was previously noted in the right  axillosubclavian vein and could still be present, but this is not evaluated due to the left-sided injection today. The distal SVC and cavoatrial junction, and the cardiac chambers opacify well. Nonocclusive thrombus in the azygous vein is also again noted. Partially occlusive thrombus also appears to still be present within an engorged right internal mammary vein on series 8 images 40-54. The heart is slightly enlarged, with left ventricular and septal hypertrophy and patchy calcification of the mitral ring. There is no enlargement of the right chambers. Small pericardial effusion is again noted. There are normal caliber pulmonary arteries, adequately opacified to the segmental level with no evidence of arterial embolism, but the subsegmental arteries largely unopacified and not evaluated. There are moderate patchy calcific plaques in the aorta and proximal great vessels skull with heavy calcifications and thickening of the aortic valve leaflets without aortic aneurysm, dissection or stenosis. There are scattered three-vessel coronary artery calcific plaques. The pulmonary veins are normal in caliber. Mediastinum/Nodes: Stable prominent right mid hilar lymph node of 1.4 cm short axis on 8:54. Right paratracheal area is difficult to assess due to the engorged thrombosed SVC but no new abnormality is seen in the area. I do not see further adenopathy. Lungs/Pleura: Interval new small layering right pleural effusion. No left pleural effusion. Right upper lobe irregularly thick-walled cavitary lesion posteriorly again noted measuring 5.1 x 4.1 cm, not notably changed in size but with increased fluid now  almost completely filling the cavity. Infectious complication not excluded. There are scarring changes, mild volume loss in the right middle lobe. Stable 7 mm ground-glass right upper lobe nodule on 10:66. Remaining lungs are generally clear. Upper abdomen: No acute abnormality. Musculoskeletal: No regional bone  metastases. Review of the MIP images confirms the above findings. IMPRESSION: 1. No acute intracranial CT findings or depressed skull fractures. 2. Atrophy, small-vessel disease and multiple chronic lacunar infarcts. 3. Sinus disease with increased membrane thickening in the frontal and maxillary sinuses, increased patchy opacification of the ethmoid air cells. 4. Persistent right mastoid effusion and partial opacification of the right middle ear. Interval increased left mastoid effusion. 5. No evidence of cervical fractures or malalignment. 6. C5-6 degenerative disc disease and spondylosis with mild foraminal stenosis. 7. No evidence of pulmonary arterial dilatation or embolus through the segmental level. Subsegmental arteries are largely unopacified and not evaluated. 8. 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 9. Not well evaluated, there may still be nonocclusive thrombus in the right axillosubclavian vein and azygous vein, and there is partially occlusive thrombus in the right internal mammary vein. 10. Interval new small layering right pleural effusion. 11. 5.1 x 4.1 cm right upper lobe cavitary mass is now almost fully filled with fluid. Infectious complication not excluded. 12. Stable 7 mm ground-glass right upper lobe nodule. 13. Stable prominent right mid hilar lymph node. 14. Aortic and coronary artery atherosclerosis. Heavily calcified thickened aortic valve leaflets. Aortic Atherosclerosis (ICD10-I70.0). Electronically Signed   By: Francis Quam M.D.   On: 11/11/2023 20:59   CT Head Wo Contrast Result Date: 11/11/2023 CLINICAL DATA:  Clemens 2 days ago with head and neck trauma and midline neck tenderness. Reports taking blood thinners. Increased leg weakness and swelling. Pulmonary embolism suspected. Prior history of lung cancer. EXAM: CT  HEAD WITHOUT CONTRAST CT CERVICAL SPINE WITHOUT CONTRAST CT CHEST, ABDOMEN AND PELVIS WITH CONTRAST TECHNIQUE: Contiguous axial images were obtained from the base of the skull through the vertex without intravenous contrast. Multidetector CT imaging of the cervical spine was performed without intravenous contrast. Multiplanar CT image reconstructions were also generated. Multidetector CT imaging of the chest was performed following the standard protocol during bolus administration of intravenous contrast. Multiplanar CT image reconstructions and MIPS were obtained to evaluate the vascular anatomy. RADIATION DOSE REDUCTION: This exam was performed according to the departmental dose-optimization program which includes automated exposure control, adjustment of the mA and/or kV according to patient size and/or use of iterative reconstruction technique. CONTRAST:  OMNIPAQUE  IOHEXOL  350 MG/ML SOLN COMPARISON:  MRI brain 10/19/2023, CTA head and neck and reconstructions 11/08/2021, soft tissue neck CT 10/18/2023, CTA chest 10/18/2023 and 10/14/2023. FINDINGS: CT HEAD FINDINGS Brain: There is moderately advanced atrophy, small-vessel disease and atrophic ventriculomegaly. Multiple tiny chronic bilateral cerebellar lacunar infarcts are again noted, small chronic lacunar infarcts in the right thalamus and right pons, and a few bilateral chronic basal ganglia lacunae are again noted. No cortical based acute infarct, hemorrhage, mass or mass effect are seen. No new midline shift. Basal cisterns are clear. Vascular: Age advanced calcific plaque both siphons. No hyperdense vessel is seen. 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 If this is still present, it is not visible with noncontrast CT. Skull: Negative for fractures or focal lesions. Sinuses/Orbits: Negative  orbits. Increased patchy opacification ethmoid air cells and  increased membrane thickening both maxillary and frontal sinuses. The sphenoid sinuses clear. Again noted is a diffuse right mastoid effusion and partial opacification in the right middle ear. There is increased moderate patchy left mastoid effusion with clear left middle ear cavity. Other: None. CT CERVICAL FINDINGS Alignment: There is a straightened slightly reversed cervical lordosis. There is no listhesis. No widening of the anterior atlantodental joint. No C1-2 lateral mass offset. Skull base and vertebrae: No acute fracture is evident. No primary bone lesion or focal pathologic process. Soft tissues and spinal canal: No prevertebral fluid or swelling. No visible canal hematoma. There is trace calcification at the carotid bifurcations. There is no thyroid  or laryngeal mass. Unremarkable parotid and submandibular glands. Disc levels: The C5-6 disc is degenerative with small bidirectional osteophytes. The other discs are normal in heights. No significant soft tissue or bony encroachment on the thecal sac and cord is seen. There are slight facet spurring changes at most levels, uncinate spurring at C5-6. Foraminal stenosis is only seen at C5-6 where it is mild. The other foramina appear to be patent. Small anterior endplate spurring is seen at C3-4, C4-5 and C6-7 but no bulky osteophytes or bridging bone. Other:  None. CT CHEST FINDINGS Cardiovascular: There are extensive mediastinal, anterior chest wall and paraspinal venous collateral vessels filling with dense contrast. 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 The SVC was better demonstrated on last study due to the right-sided injection on that exam. Extensive but nonocclusive thrombus was previously noted in the right axillosubclavian vein and could still be present, but this is not evaluated due to the left-sided injection today.  The distal SVC and cavoatrial junction, and the cardiac chambers opacify well. Nonocclusive thrombus in the azygous vein is also again noted. Partially occlusive thrombus also appears to still be present within an engorged right internal mammary vein on series 8 images 40-54. The heart is slightly enlarged, with left ventricular and septal hypertrophy and patchy calcification of the mitral ring. There is no enlargement of the right chambers. Small pericardial effusion is again noted. There are normal caliber pulmonary arteries, adequately opacified to the segmental level with no evidence of arterial embolism, but the subsegmental arteries largely unopacified and not evaluated. There are moderate patchy calcific plaques in the aorta and proximal great vessels skull with heavy calcifications and thickening of the aortic valve leaflets without aortic aneurysm, dissection or stenosis. There are scattered three-vessel coronary artery calcific plaques. The pulmonary veins are normal in caliber. Mediastinum/Nodes: Stable prominent right mid hilar lymph node of 1.4 cm short axis on 8:54. Right paratracheal area is difficult to assess due to the engorged thrombosed SVC but no new abnormality is seen in the area. I do not see further adenopathy. Lungs/Pleura: Interval new small layering right pleural effusion. No left pleural effusion. Right upper lobe irregularly thick-walled cavitary lesion posteriorly again noted measuring 5.1 x 4.1 cm, not notably changed in size but with increased fluid now almost completely filling the cavity. Infectious complication not excluded. There are scarring changes, mild volume loss in the right middle lobe. Stable 7 mm ground-glass right upper lobe nodule on 10:66. Remaining lungs are generally clear. Upper abdomen: No acute abnormality. Musculoskeletal: No regional bone metastases. Review of the MIP images confirms the above findings. IMPRESSION: 1. No acute intracranial CT findings or  depressed skull fractures. 2. Atrophy, small-vessel disease and multiple chronic lacunar infarcts. 3. Sinus disease with increased membrane thickening in the frontal and maxillary sinuses, increased patchy opacification of the ethmoid air cells. 4. Persistent right mastoid effusion and partial opacification of the right middle ear. Interval increased left mastoid effusion. 5. No evidence of cervical fractures or malalignment. 6. C5-6 degenerative disc disease and spondylosis with mild foraminal stenosis. 7. No evidence of pulmonary arterial dilatation or embolus through the segmental level. Subsegmental arteries are largely unopacified and not evaluated. 8. 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 9. Not well evaluated, there may still be nonocclusive thrombus in the right axillosubclavian vein and azygous vein, and there is partially occlusive thrombus in the right internal mammary vein. 10. Interval new small layering right pleural effusion. 11. 5.1 x 4.1 cm right upper lobe cavitary mass is now almost fully filled with fluid. Infectious complication not excluded. 12. Stable 7 mm ground-glass right upper lobe nodule. 13. Stable prominent right mid hilar lymph node. 14. Aortic and coronary artery atherosclerosis. Heavily calcified thickened aortic valve leaflets. Aortic Atherosclerosis (ICD10-I70.0). Electronically Signed   By: Francis Quam M.D.   On: 11/11/2023 20:59   CT Cervical Spine Wo Contrast Result Date: 11/11/2023 CLINICAL DATA:  Clemens 2 days ago with head and neck trauma and midline neck tenderness. Reports taking blood thinners. Increased leg weakness and swelling. Pulmonary embolism suspected. Prior history of lung cancer. EXAM: CT HEAD WITHOUT CONTRAST CT CERVICAL SPINE WITHOUT CONTRAST CT CHEST, ABDOMEN AND PELVIS WITH CONTRAST TECHNIQUE:  Contiguous axial images were obtained from the base of the skull through the vertex without intravenous contrast. Multidetector CT imaging of the cervical spine was performed without intravenous contrast. Multiplanar CT image reconstructions were also generated. Multidetector CT imaging of the chest was performed following the standard protocol during bolus administration of intravenous contrast. Multiplanar CT image reconstructions and MIPS were obtained to evaluate the vascular anatomy. RADIATION DOSE REDUCTION: This exam was performed according to the departmental dose-optimization program which includes automated exposure control, adjustment of the mA and/or kV according to patient size and/or use of iterative reconstruction technique. CONTRAST:  OMNIPAQUE  IOHEXOL  350 MG/ML SOLN COMPARISON:  MRI brain 10/19/2023, CTA head and neck and reconstructions 11/08/2021, soft tissue neck CT 10/18/2023, CTA chest 10/18/2023 and 10/14/2023. FINDINGS: CT HEAD FINDINGS Brain: There is moderately advanced atrophy, small-vessel disease and atrophic ventriculomegaly. Multiple tiny chronic bilateral cerebellar lacunar infarcts are again noted, small chronic lacunar infarcts in the right thalamus and right pons, and a few bilateral chronic basal ganglia lacunae are again noted. No cortical based acute infarct, hemorrhage, mass or mass effect are seen. No new midline shift. Basal cisterns are clear. Vascular: Age advanced calcific plaque both siphons. No hyperdense vessel is seen. 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 If this is still present, it is not visible with noncontrast CT. Skull: Negative for fractures or focal lesions. Sinuses/Orbits: Negative orbits. Increased patchy opacification ethmoid air cells and increased membrane thickening both maxillary and frontal sinuses. The sphenoid sinuses clear. Again noted is a  diffuse right mastoid effusion and partial opacification in the right middle ear. There is increased moderate patchy left mastoid effusion with clear left middle ear cavity. Other: None. CT CERVICAL FINDINGS Alignment: There is a straightened slightly reversed  cervical lordosis. There is no listhesis. No widening of the anterior atlantodental joint. No C1-2 lateral mass offset. Skull base and vertebrae: No acute fracture is evident. No primary bone lesion or focal pathologic process. Soft tissues and spinal canal: No prevertebral fluid or swelling. No visible canal hematoma. There is trace calcification at the carotid bifurcations. There is no thyroid  or laryngeal mass. Unremarkable parotid and submandibular glands. Disc levels: The C5-6 disc is degenerative with small bidirectional osteophytes. The other discs are normal in heights. No significant soft tissue or bony encroachment on the thecal sac and cord is seen. There are slight facet spurring changes at most levels, uncinate spurring at C5-6. Foraminal stenosis is only seen at C5-6 where it is mild. The other foramina appear to be patent. Small anterior endplate spurring is seen at C3-4, C4-5 and C6-7 but no bulky osteophytes or bridging bone. Other:  None. CT CHEST FINDINGS Cardiovascular: There are extensive mediastinal, anterior chest wall and paraspinal venous collateral vessels filling with dense contrast. 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 The SVC was better demonstrated on last study due to the right-sided injection on that exam. Extensive but nonocclusive thrombus was previously noted in the right axillosubclavian vein and could still be present, but this is not evaluated due to the left-sided injection today. The distal SVC and cavoatrial junction, and the cardiac chambers opacify well. Nonocclusive thrombus in the  azygous vein is also again noted. Partially occlusive thrombus also appears to still be present within an engorged right internal mammary vein on series 8 images 40-54. The heart is slightly enlarged, with left ventricular and septal hypertrophy and patchy calcification of the mitral ring. There is no enlargement of the right chambers. Small pericardial effusion is again noted. There are normal caliber pulmonary arteries, adequately opacified to the segmental level with no evidence of arterial embolism, but the subsegmental arteries largely unopacified and not evaluated. There are moderate patchy calcific plaques in the aorta and proximal great vessels skull with heavy calcifications and thickening of the aortic valve leaflets without aortic aneurysm, dissection or stenosis. There are scattered three-vessel coronary artery calcific plaques. The pulmonary veins are normal in caliber. Mediastinum/Nodes: Stable prominent right mid hilar lymph node of 1.4 cm short axis on 8:54. Right paratracheal area is difficult to assess due to the engorged thrombosed SVC but no new abnormality is seen in the area. I do not see further adenopathy. Lungs/Pleura: Interval new small layering right pleural effusion. No left pleural effusion. Right upper lobe irregularly thick-walled cavitary lesion posteriorly again noted measuring 5.1 x 4.1 cm, not notably changed in size but with increased fluid now almost completely filling the cavity. Infectious complication not excluded. There are scarring changes, mild volume loss in the right middle lobe. Stable 7 mm ground-glass right upper lobe nodule on 10:66. Remaining lungs are generally clear. Upper abdomen: No acute abnormality. Musculoskeletal: No regional bone metastases. Review of the MIP images confirms the above findings. IMPRESSION: 1. No acute intracranial CT findings or depressed skull fractures. 2. Atrophy, small-vessel disease and multiple chronic lacunar infarcts. 3. Sinus  disease with increased membrane thickening in the frontal and maxillary sinuses, increased patchy opacification of the ethmoid air cells. 4. Persistent right mastoid effusion and partial opacification of the right middle ear. Interval increased left mastoid effusion. 5. No evidence of cervical fractures or malalignment. 6. C5-6 degenerative disc  disease and spondylosis with mild foraminal stenosis. 7. No evidence of pulmonary arterial dilatation or embolus through the segmental level. Subsegmental arteries are largely unopacified and not evaluated. 8. 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 9. Not well evaluated, there may still be nonocclusive thrombus in the right axillosubclavian vein and azygous vein, and there is partially occlusive thrombus in the right internal mammary vein. 10. Interval new small layering right pleural effusion. 11. 5.1 x 4.1 cm right upper lobe cavitary mass is now almost fully filled with fluid. Infectious complication not excluded. 12. Stable 7 mm ground-glass right upper lobe nodule. 13. Stable prominent right mid hilar lymph node. 14. Aortic and coronary artery atherosclerosis. Heavily calcified thickened aortic valve leaflets. Aortic Atherosclerosis (ICD10-I70.0). Electronically Signed   By: Francis Quam M.D.   On: 11/11/2023 20:59   US  Venous Img Lower Right (DVT Study) Result Date: 11/11/2023 EXAM: ULTRASOUND DUPLEX OF THE RIGHT LOWER EXTREMITY VEINS TECHNIQUE: Duplex ultrasound using B-mode/gray scaled imaging and Doppler spectral analysis and color flow was obtained of the deep venous structures of the right lower extremity. COMPARISON: None. CLINICAL HISTORY: Swelling, lung cancer. FINDINGS: The common femoral vein, femoral vein, popliteal vein, and posterior tibial vein of the right lower extremity demonstrate  normal compressibility with normal color flow and spectral analysis. IMPRESSION: 1. No evidence of DVT. 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 She presents today with ***. Dr. Izell recommends an approximate 6.5 week course of radiation given concurrently with chemotherapy.   Today, I talked to the patient and family about the findings and work-up thus far.  We discussed the natural history of *** and general treatment, highlighting the role of radiotherapy in the management.  We discussed the available radiation techniques, and focused on the details of logistics and delivery.  We reviewed the anticipated acute and late sequelae associated with radiation in this setting.  The patient was encouraged to ask questions that I answered to the best of my ability. *** A patient consent form was discussed and signed.  We retained a copy for our records.  The patient would like to proceed with radiation and will be scheduled for CT simulation.   Plan:  Patient is scheduled for CT simulation later today. Dr. Izell anticipates *** given concurrently with chemotherapy. She is scheduled for her first cycle of chemotherapy on 12/06/2023.    -----------------------------------   Leeroy Due, PA-C

## 2023-11-30 NOTE — Progress Notes (Signed)
 Has armband been applied?  Yes.    Does patient have an allergy to IV contrast dye?: No.   Has patient ever received premedication for IV contrast dye?: N/A   Does patient take metformin?: No.  If patient does take metformin when was the last dose: N/A  Date of lab work: 11/17/2023 BUN: 11 CR: 0.60 eGfr: >60  IV site: Left AC  Has IV site been added to flowsheet?  Yes.    BP (!) 142/82   Pulse (!) 113   Resp 18   LMP 11/16/2016   SpO2 100%

## 2023-12-02 ENCOUNTER — Telehealth: Payer: Self-pay

## 2023-12-02 ENCOUNTER — Inpatient Hospital Stay

## 2023-12-02 NOTE — Telephone Encounter (Signed)
 Spoke with daughter [name]. Daughter stated that she was told to follow the appointments only on the schedule given to her from radiation. Explained to  [name] that is only for the radiation appointments. She needs to follow My Chart for the Chemotherapy appointments. [name] verbalized understanding. Rescheduled chemotherapy education for 12-06-23 at 0900 while patient is here for treatment.

## 2023-12-04 ENCOUNTER — Other Ambulatory Visit (HOSPITAL_BASED_OUTPATIENT_CLINIC_OR_DEPARTMENT_OTHER): Payer: Self-pay

## 2023-12-04 ENCOUNTER — Telehealth: Payer: Self-pay | Admitting: Internal Medicine

## 2023-12-04 ENCOUNTER — Other Ambulatory Visit (HOSPITAL_COMMUNITY): Payer: Self-pay

## 2023-12-04 MED ORDER — APIXABAN 5 MG PO TABS
5.0000 mg | ORAL_TABLET | Freq: Two times a day (BID) | ORAL | 0 refills | Status: DC
  Filled 2023-12-04: qty 60, 30d supply, fill #0

## 2023-12-04 NOTE — Telephone Encounter (Signed)
 Received a message from [facility] pharmacy that the patient has run out of eliquis  and is at the pharmacy asking for eliquis .  Sent a prescription for eliquis , 1 month.    Relayed to the pharmacist that patient needs to contact her primary care physician or her oncologist to refill any further medications.    Nydia Distance M.D.  [facility] 12/04/2023, 12:11 PM

## 2023-12-06 ENCOUNTER — Encounter: Payer: Self-pay | Admitting: *Deleted

## 2023-12-06 ENCOUNTER — Inpatient Hospital Stay

## 2023-12-06 ENCOUNTER — Ambulatory Visit

## 2023-12-06 DIAGNOSIS — Z510 Encounter for antineoplastic radiation therapy: Secondary | ICD-10-CM | POA: Diagnosis not present

## 2023-12-06 DIAGNOSIS — R531 Weakness: Secondary | ICD-10-CM

## 2023-12-06 DIAGNOSIS — G08 Intracranial and intraspinal phlebitis and thrombophlebitis: Secondary | ICD-10-CM

## 2023-12-06 DIAGNOSIS — I82A11 Acute embolism and thrombosis of right axillary vein: Secondary | ICD-10-CM

## 2023-12-06 LAB — ACID FAST CULTURE WITH REFLEXED SENSITIVITIES (MYCOBACTERIA): Acid Fast Culture: NEGATIVE

## 2023-12-06 NOTE — Therapy (Signed)
 [place] [facility] [address] [place], [place], [address] Phone: [phone]   Fax:  [phone]  Patient Details  Name: Andrea Mullen MRN: [id number] Date of Birth: 1965-06-14 Referring Provider:  Caleen Burgess BROCKS, MD  Encounter Date: 12/06/2023  Speech Therapy (ST) Evaluation Arrive-Cancel Pt was upset she missed [facility] appointments this morning and missed breakfast due to misunderstanding which appointments were at [facility].  [name] was unclear why she was here at our clinic today for ST evaluation and stated she did not wish to have this appointment today. Pt also has PT referral on her chart and wishes to reschedule the ST evaluation after she undergoes her PT evaluation. SLP agreed as pt does not wish to participate in ST evaluation today.   [facility], CCC-SLP 12/06/2023, 2:34 PM  [place] [facility] [address] [place], [place], [address] Phone: [phone]   Fax:  [phone]

## 2023-12-06 NOTE — Progress Notes (Signed)
 NN called [name] this morning at 9:10. NN asks if the pt was okay and if she was aware she had missed her lab and infusion appts this morning. [name] states she is on the other line with someone from the cancer center, unable to provide a name, but she thought the pt's appt at the cancer center was today at 2pm. NN told [name] the 2pm appt is for a Neuro appt and that she and this NN spoke on Friday 10/24 and confirmed the pt's appts for today.  NN let [name] go at this time to finish the other call with the cancer center.

## 2023-12-07 ENCOUNTER — Emergency Department (HOSPITAL_COMMUNITY)

## 2023-12-07 ENCOUNTER — Encounter (HOSPITAL_COMMUNITY): Payer: Self-pay

## 2023-12-07 ENCOUNTER — Ambulatory Visit: Admission: RE | Admit: 2023-12-07 | Source: Ambulatory Visit | Admitting: Radiation Oncology

## 2023-12-07 ENCOUNTER — Other Ambulatory Visit (HOSPITAL_COMMUNITY): Payer: Self-pay

## 2023-12-07 ENCOUNTER — Other Ambulatory Visit: Payer: Self-pay

## 2023-12-07 ENCOUNTER — Emergency Department (HOSPITAL_COMMUNITY)
Admission: EM | Admit: 2023-12-07 | Discharge: 2023-12-08 | Disposition: A | Discharge status: Home / Self Care | Attending: Emergency Medicine | Admitting: Emergency Medicine

## 2023-12-07 DIAGNOSIS — W19XXXA Unspecified fall, initial encounter: Secondary | ICD-10-CM

## 2023-12-07 DIAGNOSIS — Z8673 Personal history of transient ischemic attack (TIA), and cerebral infarction without residual deficits: Secondary | ICD-10-CM | POA: Insufficient documentation

## 2023-12-07 DIAGNOSIS — Z85118 Personal history of other malignant neoplasm of bronchus and lung: Secondary | ICD-10-CM | POA: Insufficient documentation

## 2023-12-07 DIAGNOSIS — M545 Low back pain, unspecified: Secondary | ICD-10-CM | POA: Insufficient documentation

## 2023-12-07 DIAGNOSIS — Z7901 Long term (current) use of anticoagulants: Secondary | ICD-10-CM | POA: Diagnosis not present

## 2023-12-07 DIAGNOSIS — W06XXXA Fall from bed, initial encounter: Secondary | ICD-10-CM | POA: Insufficient documentation

## 2023-12-07 DIAGNOSIS — F0390 Unspecified dementia without behavioral disturbance: Secondary | ICD-10-CM | POA: Insufficient documentation

## 2023-12-07 LAB — URINALYSIS, W/ REFLEX TO CULTURE (INFECTION SUSPECTED)
Bilirubin Urine: NEGATIVE
Glucose, UA: NEGATIVE mg/dL
Hgb urine dipstick: NEGATIVE
Ketones, ur: NEGATIVE mg/dL
Nitrite: NEGATIVE
Protein, ur: NEGATIVE mg/dL
Specific Gravity, Urine: 1.005 (ref 1.005–1.030)
pH: 7 (ref 5.0–8.0)

## 2023-12-07 LAB — COMPREHENSIVE METABOLIC PANEL WITH GFR
ALT: 13 U/L (ref 0–44)
AST: 18 U/L (ref 15–41)
Albumin: 3.3 g/dL — ABNORMAL LOW (ref 3.5–5.0)
Alkaline Phosphatase: 52 U/L (ref 38–126)
Anion gap: 8 (ref 5–15)
BUN: <5 mg/dL — ABNORMAL LOW (ref 6–20)
CO2: 28 mmol/L (ref 22–32)
Calcium: 9.1 mg/dL (ref 8.9–10.3)
Chloride: 101 mmol/L (ref 98–111)
Creatinine, Ser: 0.69 mg/dL (ref 0.44–1.00)
GFR, Estimated: >60 mL/min (ref >60)
Glucose, Bld: 102 mg/dL — ABNORMAL HIGH (ref 70–99)
Potassium: 3.8 mmol/L (ref 3.5–5.1)
Sodium: 137 mmol/L (ref 135–145)
Total Bilirubin: 0.5 mg/dL (ref 0.0–1.2)
Total Protein: 7.5 g/dL (ref 6.5–8.1)

## 2023-12-07 LAB — CBC WITH DIFFERENTIAL/PLATELET
Abs Immature Granulocytes: 0.02 K/uL (ref 0.00–0.07)
Basophils Absolute: 0 K/uL (ref 0.0–0.1)
Basophils Relative: 0 %
Eosinophils Absolute: 0.1 K/uL (ref 0.0–0.5)
Eosinophils Relative: 1 %
HCT: 34.8 % — ABNORMAL LOW (ref 36.0–46.0)
Hemoglobin: 10.8 g/dL — ABNORMAL LOW (ref 12.0–15.0)
Immature Granulocytes: 0 %
Lymphocytes Relative: 19 %
Lymphs Abs: 0.9 K/uL (ref 0.7–4.0)
MCH: 27.8 pg (ref 26.0–34.0)
MCHC: 31 g/dL (ref 30.0–36.0)
MCV: 89.5 fL (ref 80.0–100.0)
Monocytes Absolute: 0.8 K/uL (ref 0.1–1.0)
Monocytes Relative: 15 %
Neutro Abs: 3.1 K/uL (ref 1.7–7.7)
Neutrophils Relative %: 65 %
Platelets: 175 K/uL (ref 150–400)
RBC: 3.89 MIL/uL (ref 3.87–5.11)
RDW: 15.2 % (ref 11.5–15.5)
WBC: 4.9 K/uL (ref 4.0–10.5)
nRBC: 0 % (ref 0.0–0.2)

## 2023-12-07 NOTE — ED Notes (Signed)
 On assessment pt is eating and drinking with daughter at bedside. No complaints at this time

## 2023-12-07 NOTE — ED Notes (Signed)
 CCMD contacted.

## 2023-12-07 NOTE — ED Provider Notes (Signed)
 [place] EMERGENCY DEPARTMENT AT [facility] Provider Note   CSN: [id number] Arrival date & time: 12/07/23  1649     Patient presents with: Andrea Mullen Andrea Mullen is a 58 y.o. female.  With a history of dementia, CVA, PE on Eliquis , lung cancer and NSTEMI presents to the ED after fall.  Patient had an unwitnessed fall out of bed earlier today.  Lives at home with family.  Unclear exactly when this happened.  Reported some back pain after the fall.  Upon initial assessment patient does unable to provide additional history stating that she fell off the back of a truck today when she was on her way to purchase a cow with her husband.  She does report some lower back pain.  Denies headaches neck pain chest pain shortness of breath abdominal pain pain in extremities.  Additional history limited at this time secondary to dementia    Fall       Prior to Admission medications   Medication Sig Start Date End Date Taking? Authorizing Provider  albuterol  (PROVENTIL ) (2.5 MG/3ML) 0.083% nebulizer solution Take 3 mLs (2.5 mg total) by nebulization every 6 (six) hours as needed for wheezing or shortness of breath. Patient not taking: Reported on 11/30/2023 10/14/23   Ula Prentice SAUNDERS, MD  albuterol  (VENTOLIN  HFA) 108 [phone] Base) MCG/ACT inhaler Inhale 2 puffs into the lungs every 4 (four) hours as needed for wheezing or shortness of breath. 10/14/23   Ula Prentice SAUNDERS, MD  albuterol  (VENTOLIN  HFA) 108 [phone] Base) MCG/ACT inhaler Inhale 2 puffs into the lungs every 6 (six) hours as needed for wheezing or shortness of breath. 11/18/23   Rai, Ripudeep MARLA, MD  apixaban  (ELIQUIS ) 5 MG TABS tablet Take 1 tablet (5 mg total) by mouth 2 (two) times daily.NO MORE REFILLS. MUST FOLLOW UP WITH PCP OR ONCOLOGIST. 12/04/23 01/03/24  Davia Nydia MARLA, MD  cyclobenzaprine  (FLEXERIL ) 5 MG tablet Take 1 tablet (5 mg total) by mouth 3 (three) times daily as needed for muscle spasms. 10/27/23   Amin, Burgess BROCKS, MD   HYDROcodone -acetaminophen  (NORCO/VICODIN) 5-325 MG tablet Take 1 tablet by mouth every 6 (six) hours as needed for moderate pain (pain score 4-6). 11/18/23   Rai, Nydia MARLA, MD  lidocaine -prilocaine  (EMLA ) cream Apply to affected area once 11/03/23   Mohamed, Mohamed, MD  Nystatin (GERHARDT'S BUTT CREAM) CREA Apply 1 Application topically 3 (three) times daily. Apply to affected area 11/18/23   Rai, Nydia MARLA, MD  ondansetron  (ZOFRAN ) 8 MG tablet Take 1 tablet (8 mg total) by mouth every 8 (eight) hours as needed for nausea or vomiting. Start on the third day after chemotherapy. 11/03/23   Sherrod Sherrod, MD  polyethylene glycol powder (GLYCOLAX /MIRALAX ) 17 GM/SCOOP powder Take 17 g by mouth 2 (two) times daily as needed for mild constipation or moderate constipation. Dissolve 1 capful (17g) in 4-8 ounces of liquid and take by mouth daily. Patient not taking: Reported on 11/30/2023 10/27/23   Caleen Burgess BROCKS, MD  prochlorperazine  (COMPAZINE ) 10 MG tablet Take 1 tablet (10 mg total) by mouth every 6 (six) hours as needed. 11/03/23   Heilingoetter, Cassandra L, PA-C  rosuvastatin  (CRESTOR ) 40 MG tablet Take 1 tablet (40 mg total) by mouth daily. Patient not taking: Reported on 11/30/2023 01/07/22   Shelah Lamar RAMAN, MD  senna-docusate (SENOKOT-S) 8.6-50 MG tablet Take 1 tablet by mouth 2 (two) times daily. 10/27/23   Caleen Burgess BROCKS, MD    Allergies: Bicillin c-r,  Penicillins, Nsaids, and Prednisone     Review of Systems  Updated Vital Signs BP 104/60   Pulse (!) 114   Temp 97.8 F (36.6 C) (Oral)   Resp 14   Ht 5' 5 (1.651 m)   Wt 80.3 kg   LMP 11/16/2016   SpO2 100%   BMI 29.45 kg/m   Physical Exam Vitals and nursing note reviewed.  HENT:     Head: Normocephalic and atraumatic.  Eyes:     Pupils: Pupils are equal, round, and reactive to light.  Cardiovascular:     Rate and Rhythm: Normal rate and regular rhythm.  Pulmonary:     Effort: Pulmonary effort is normal.     Breath sounds:  Normal breath sounds.  Abdominal:     Palpations: Abdomen is soft.     Tenderness: There is no abdominal tenderness.  Musculoskeletal:     Comments: Some midline lumbar tenderness No step-off deformity 5 out of 5 motor strength bilateral upper and lower extremities sensation intact light touch throughout.  Skin:    General: Skin is warm and dry.  Neurological:     Mental Status: She is alert.     Comments: Oriented to self and time Not oriented to place or situation No focal neurologic deficits Full active range of motion bilateral upper and lower extremities clear fluent speech  Psychiatric:        Mood and Affect: Mood normal.     (all labs ordered are listed, but only abnormal results are displayed) Labs Reviewed  COMPREHENSIVE METABOLIC PANEL WITH GFR - Abnormal; Notable for the following components:      Result Value   Glucose, Bld 102 (*)    BUN <5 (*)    Albumin 3.3 (*)    All other components within normal limits  CBC WITH DIFFERENTIAL/PLATELET - Abnormal; Notable for the following components:   Hemoglobin 10.8 (*)    HCT 34.8 (*)    All other components within normal limits  URINALYSIS, W/ REFLEX TO CULTURE (INFECTION SUSPECTED) - Abnormal; Notable for the following components:   Leukocytes,Ua TRACE (*)    Bacteria, UA RARE (*)    All other components within normal limits    EKG: None  Radiology: CT Lumbar Spine Wo Contrast Result Date: 12/07/2023 CLINICAL DATA:  Right history: Back trauma, no prior imaging (Age >= 16y) EXAM: CT LUMBAR SPINE WITHOUT CONTRAST TECHNIQUE: Multidetector CT imaging of the lumbar spine was performed without intravenous contrast administration. Multiplanar CT image reconstructions were also generated. RADIATION DOSE REDUCTION: This exam was performed according to the departmental dose-optimization program which includes automated exposure control, adjustment of the mA and/or kV according to patient size and/or use of iterative  reconstruction technique. COMPARISON:  None Available. FINDINGS: Segmentation: 5 lumbar type vertebrae. Alignment: Exaggerated lumbar lordosis. No traumatic subluxation. No listhesis. Vertebrae: No acute fracture. Vertebral body heights are normal. Posterior elements are intact. No evidence of focal bone lesion Paraspinal and other soft tissues: Trace right pleural effusion, unchanged or diminished from recent PET. Abdominal aortic atherosclerosis. Disc levels: Slight L3-L4 disc bulge. Mild multilevel facet hypertrophy. No high-grade canal stenosis. IMPRESSION: 1. No acute fracture or subluxation of the lumbar spine. 2. Trace right pleural effusion, unchanged or diminished from recent PET. Aortic Atherosclerosis (ICD10-I70.0). Electronically Signed   By: Andrea Gasman M.D.   On: 12/07/2023 18:00   CT Cervical Spine Wo Contrast Result Date: 12/07/2023 CLINICAL DATA:  Neck trauma, dangerous injury mechanism (Age 63-64y) EXAM: CT CERVICAL SPINE  WITHOUT CONTRAST TECHNIQUE: Multidetector CT imaging of the cervical spine was performed without intravenous contrast. Multiplanar CT image reconstructions were also generated. RADIATION DOSE REDUCTION: This exam was performed according to the departmental dose-optimization program which includes automated exposure control, adjustment of the mA and/or kV according to patient size and/or use of iterative reconstruction technique. COMPARISON:  11/11/2023 FINDINGS: Alignment: Chronic straightening of normal lordosis. No traumatic subluxation. Skull base and vertebrae: No acute fracture. Vertebral body heights are maintained. The dens and skull base are intact. No evidence of focal bone lesion. Soft tissues and spinal canal: No prevertebral fluid or swelling. No visible canal hematoma. Disc levels: The disc spaces are preserved. No spinal canal stenosis per Upper chest: No acute findings. Known right upper lobe mass is minimally included in the inferior field of view. Other:  None. IMPRESSION: No acute fracture or traumatic subluxation of the cervical spine. Electronically Signed   By: Andrea Gasman M.D.   On: 12/07/2023 17:57   CT Head Wo Contrast Result Date: 12/07/2023 CLINICAL DATA:  Head trauma, moderate-severe EXAM: CT HEAD WITHOUT CONTRAST TECHNIQUE: Contiguous axial images were obtained from the base of the skull through the vertex without intravenous contrast. RADIATION DOSE REDUCTION: This exam was performed according to the departmental dose-optimization program which includes automated exposure control, adjustment of the mA and/or kV according to patient size and/or use of iterative reconstruction technique. COMPARISON:  Head CT 11/11/2023, MRI 10/19/2023 FINDINGS: Brain: No evidence of acute hemorrhage. Stable degree of atrophy and chronic small vessel ischemia. Remote lacunar infarcts in the basal ganglia. No evidence of acute ischemia. No subdural or extra-axial collection. No evidence of midline shift or mass effect. Vascular: Atherosclerosis of skullbase vasculature without hyperdense vessel or abnormal calcification. Skull: No fracture or focal lesion. Sinuses/Orbits: Opacification of the right greater than left mastoid air cells, chronic, not significantly changed. No acute findings. Other: No confluent scalp hematoma. IMPRESSION: 1. No acute intracranial abnormality. No skull fracture. 2. Stable atrophy and chronic small vessel ischemia. Electronically Signed   By: Andrea Gasman M.D.   On: 12/07/2023 17:54   DG Pelvis Portable Result Date: 12/07/2023 EXAM: 1 or 2 VIEW(S) XRAY OF THE PELVIS 12/07/2023 05:31:00 PM COMPARISON: CT 10/25/2018. CLINICAL HISTORY: Fall. Triage notes: Pt presents from home with family. Family reports pt fell out of bed unwitnessed. Hx of dementia. Pt is not sure how she got on the floor. NO LOC, unsure if she hit head. Complains of lower back pain. Family on scene making comments about having pt placed in facility. Alert and  oriented x3. Is on blood thinners. FINDINGS: LIMITATIONS/ARTIFACTS: BONES AND JOINTS: Iliac crests are incompletely included. SI joints are non-widened. Pubic symphysis and rami are intact. No definitive fracture or malalignment. No focal osseous lesion. No joint dislocation. SOFT TISSUES: The soft tissues are unremarkable. IMPRESSION: 1. No acute fracture or malalignment identified. Cross-sectional imaging follow-up if persistent concern for hip or pelvic fracture 2. Incompletely included iliac crests. Electronically signed by: Luke Bun MD 12/07/2023 05:43 PM EDT RP Workstation: [id number]   DG Chest Portable 1 View Result Date: 12/07/2023 EXAM: 1 VIEW(S) XRAY OF THE CHEST 12/07/2023 05:31:00 PM COMPARISON: Chest x-ray 10/23/2020, PET/CT 11/30/2020. CLINICAL HISTORY: fall. Triage notes:Pt presents from home with family. Family reports pt fell out of bed unwitnessed. Hx of dementia. Pt is not sure how she got on the floor. NO LOC, unsure if she hit head. Complains of lower back pain. Family on scene making comments about ; having pt placed  in facility. Alert and oriented x3. Is on blood thinners . ; Best images obtained as possible due to patient's lack of cooperation. FINDINGS: LUNGS AND PLEURA: Redemonstrated large right upper lobe cavitary mass with interval opacification compared to the prior chest radiograph. Streaky atelectasis at the bases. No pulmonary edema. No pleural effusion. No pneumothorax. HEART AND MEDIASTINUM: Stable cardiomegaly with aortic atherosclerosis. BONES AND SOFT TISSUES: No acute osseous abnormality. IMPRESSION: 1. Large right upper lobe cavitary mass with new internal opacification, corresponding to history of known malignancy 2. Bibasilar atelectasis or scarring.  Small right effusion . 3. Cardiomegaly with aortic atherosclerosis. 4. No pneumothorax. Electronically signed by: Luke Bun MD 12/07/2023 05:41 PM EDT RP Workstation: [id number]     Procedures   Medications  Ordered in the ED - No data to display  Clinical Course as of 12/07/23 2356  Tue Dec 07, 2023  2352 No traumatic findings.  No evidence of UTI or other severe abnormalities on labs.  Spoke with patient's daughter at bedside.  She feels comfortable discharging her home under the care of her other sisters.  They will work on potential home health services  through PCP office.  Stable for discharge at this time. [MP]    Clinical Course User Index [MP] Pamella Ozell LABOR, DO                                 Medical Decision Making 59 year old female with extensive medical history as above presenting from home after unwitnessed fall out of bed.  Reported some lower back pain.  Unable to contribute additional history secondary to dementia.  Given that she has a documented history of PE on Eliquis  will obtain CT head to evaluate for intracranial hemorrhage trauma.  Will also obtain CT C-spine and L-spine along with chest x-ray pelvis x-ray to evaluate for traumatic injury.  Will obtain infectious workup to evaluate for acute infectious cause of weakness that may have contributed to the fall.  Afebrile hemodynamically stable here.  No focal neurologic deficits that would be concerning for recurrent stroke.  Amount and/or Complexity of Data Reviewed Labs: ordered. Radiology: ordered.        Final diagnoses:  Fall, initial encounter    ED Discharge Orders     None          Pamella Ozell LABOR, DO 12/07/23 2356

## 2023-12-07 NOTE — ED Notes (Signed)
 Pt states that she puts herself on the floor at home on purpose. Pt states that the floor is more comfortable and she does it to annoy her family. Pt has said this to a couple different staff members now.

## 2023-12-07 NOTE — ED Triage Notes (Addendum)
 Pt presents from home with family. Family reports pt fell out of bed unwitnessed. Hx of dementia. Pt is not sure how she got on the floor. NO LOC, unsure if she hit head. Complains of lower back pain. Family on scene making comments about having pt placed in facility. Alert and oriented x3. Is on blood thinners.

## 2023-12-07 NOTE — ED Notes (Signed)
 Pt placed on bed pan for urine sample. Already incont of urine. Brief changed

## 2023-12-07 NOTE — Discharge Instructions (Addendum)
 Jet was seen in the emergency department after a fall There were no evidence of traumatic finding Her blood work looked okay She did not have a urinary tract infection or pneumonia She should follow-up with her primary care doctor within 1 week Consult with her primary care doctor and insurance company for additional home health services She should return to the emergency department for repeated falls or to be evaluated for potential nursing facility placement

## 2023-12-08 ENCOUNTER — Ambulatory Visit
Admission: RE | Admit: 2023-12-08 | Discharge: 2023-12-08 | Disposition: A | Source: Ambulatory Visit | Attending: Radiation Oncology | Admitting: Radiation Oncology

## 2023-12-08 ENCOUNTER — Other Ambulatory Visit: Payer: Self-pay

## 2023-12-08 ENCOUNTER — Inpatient Hospital Stay

## 2023-12-08 DIAGNOSIS — Z5111 Encounter for antineoplastic chemotherapy: Secondary | ICD-10-CM | POA: Insufficient documentation

## 2023-12-08 DIAGNOSIS — Z510 Encounter for antineoplastic radiation therapy: Secondary | ICD-10-CM | POA: Diagnosis not present

## 2023-12-08 DIAGNOSIS — Z79899 Other long term (current) drug therapy: Secondary | ICD-10-CM | POA: Diagnosis not present

## 2023-12-08 DIAGNOSIS — C3411 Malignant neoplasm of upper lobe, right bronchus or lung: Secondary | ICD-10-CM | POA: Diagnosis present

## 2023-12-08 DIAGNOSIS — J984 Other disorders of lung: Secondary | ICD-10-CM

## 2023-12-08 LAB — CBC WITH DIFFERENTIAL (CANCER CENTER ONLY)
Abs Immature Granulocytes: 0.02 K/uL (ref 0.00–0.07)
Basophils Absolute: 0 K/uL (ref 0.0–0.1)
Basophils Relative: 0 %
Eosinophils Absolute: 0.1 K/uL (ref 0.0–0.5)
Eosinophils Relative: 2 %
HCT: 35.4 % — ABNORMAL LOW (ref 36.0–46.0)
Hemoglobin: 11.5 g/dL — ABNORMAL LOW (ref 12.0–15.0)
Immature Granulocytes: 0 %
Lymphocytes Relative: 17 %
Lymphs Abs: 0.8 K/uL (ref 0.7–4.0)
MCH: 28.4 pg (ref 26.0–34.0)
MCHC: 32.5 g/dL (ref 30.0–36.0)
MCV: 87.4 fL (ref 80.0–100.0)
Monocytes Absolute: 0.4 K/uL (ref 0.1–1.0)
Monocytes Relative: 10 %
Neutro Abs: 3.2 K/uL (ref 1.7–7.7)
Neutrophils Relative %: 71 %
Platelet Count: 173 K/uL (ref 150–400)
RBC: 4.05 MIL/uL (ref 3.87–5.11)
RDW: 15.4 % (ref 11.5–15.5)
WBC Count: 4.5 K/uL (ref 4.0–10.5)
nRBC: 0 % (ref 0.0–0.2)

## 2023-12-08 LAB — RAD ONC ARIA SESSION SUMMARY
Course Elapsed Days: 0
Plan Fractions Treated to Date: 1
Plan Prescribed Dose Per Fraction: 2 Gy
Plan Total Fractions Prescribed: 33
Plan Total Prescribed Dose: 66 Gy
Reference Point Dosage Given to Date: 2 Gy
Reference Point Session Dosage Given: 2 Gy
Session Number: 1

## 2023-12-08 LAB — CMP (CANCER CENTER ONLY)
ALT: 10 U/L (ref 0–44)
AST: 15 U/L (ref 15–41)
Albumin: 3.9 g/dL (ref 3.5–5.0)
Alkaline Phosphatase: 64 U/L (ref 38–126)
Anion gap: 5 (ref 5–15)
BUN: 7 mg/dL (ref 6–20)
CO2: 30 mmol/L (ref 22–32)
Calcium: 9.5 mg/dL (ref 8.9–10.3)
Chloride: 104 mmol/L (ref 98–111)
Creatinine: 0.72 mg/dL (ref 0.44–1.00)
GFR, Estimated: >60 mL/min (ref >60)
Glucose, Bld: 132 mg/dL — ABNORMAL HIGH (ref 70–99)
Potassium: 3.7 mmol/L (ref 3.5–5.1)
Sodium: 139 mmol/L (ref 135–145)
Total Bilirubin: 0.6 mg/dL (ref 0.0–1.2)
Total Protein: 7.9 g/dL (ref 6.5–8.1)

## 2023-12-09 ENCOUNTER — Other Ambulatory Visit (HOSPITAL_BASED_OUTPATIENT_CLINIC_OR_DEPARTMENT_OTHER): Payer: Self-pay

## 2023-12-09 ENCOUNTER — Other Ambulatory Visit: Payer: Self-pay

## 2023-12-09 ENCOUNTER — Other Ambulatory Visit: Payer: Self-pay | Admitting: Physician Assistant

## 2023-12-09 ENCOUNTER — Inpatient Hospital Stay

## 2023-12-09 ENCOUNTER — Ambulatory Visit: Admission: RE | Admit: 2023-12-09 | Discharge: 2023-12-09 | Attending: Radiation Oncology

## 2023-12-09 VITALS — BP 116/74 | HR 98 | Temp 98.0°F | Resp 17

## 2023-12-09 DIAGNOSIS — Z510 Encounter for antineoplastic radiation therapy: Secondary | ICD-10-CM | POA: Diagnosis not present

## 2023-12-09 DIAGNOSIS — C3491 Malignant neoplasm of unspecified part of right bronchus or lung: Secondary | ICD-10-CM

## 2023-12-09 DIAGNOSIS — Z5111 Encounter for antineoplastic chemotherapy: Secondary | ICD-10-CM | POA: Diagnosis not present

## 2023-12-09 DIAGNOSIS — J984 Other disorders of lung: Secondary | ICD-10-CM

## 2023-12-09 LAB — RAD ONC ARIA SESSION SUMMARY
Course Elapsed Days: 1
Plan Fractions Treated to Date: 2
Plan Prescribed Dose Per Fraction: 2 Gy
Plan Total Fractions Prescribed: 33
Plan Total Prescribed Dose: 66 Gy
Reference Point Dosage Given to Date: 4 Gy
Reference Point Session Dosage Given: 2 Gy
Session Number: 2

## 2023-12-09 MED ORDER — SODIUM CHLORIDE 0.9 % IV SOLN: 45.0000 mg/m2 | Freq: Once | INTRAVENOUS | Status: DC

## 2023-12-09 MED ORDER — PALONOSETRON HCL INJECTION 0.25 MG/5ML
0.2500 mg | Freq: Once | INTRAVENOUS | Status: AC
  Administered 2023-12-09: 0.25 mg via INTRAVENOUS
  Filled 2023-12-09: qty 5

## 2023-12-09 MED ORDER — SODIUM CHLORIDE 0.9 % IV SOLN: INTRAVENOUS | Status: DC

## 2023-12-09 MED ORDER — PROCHLORPERAZINE MALEATE 10 MG PO TABS
10.0000 mg | ORAL_TABLET | Freq: Four times a day (QID) | ORAL | 2 refills | Status: AC | PRN
  Filled 2023-12-09: qty 30, 8d supply, fill #0

## 2023-12-09 MED ORDER — SODIUM CHLORIDE 0.9 % IV SOLN
45.0000 mg/m2 | Freq: Once | INTRAVENOUS | Status: AC
  Administered 2023-12-09: 84 mg via INTRAVENOUS
  Filled 2023-12-09: qty 14

## 2023-12-09 MED ORDER — SODIUM CHLORIDE 0.9 % IV SOLN
246.8000 mg | Freq: Once | INTRAVENOUS | Status: AC
  Administered 2023-12-09: 250 mg via INTRAVENOUS
  Filled 2023-12-09: qty 25

## 2023-12-09 MED ORDER — ONDANSETRON HCL 8 MG PO TABS
8.0000 mg | ORAL_TABLET | Freq: Three times a day (TID) | ORAL | 1 refills | Status: AC | PRN
  Filled 2023-12-09: qty 30, 10d supply, fill #0

## 2023-12-09 MED ORDER — SODIUM CHLORIDE 0.9 % IV SOLN: 291.8000 mg | Freq: Once | INTRAVENOUS | Status: DC

## 2023-12-09 MED ORDER — DIPHENHYDRAMINE HCL 50 MG/ML IJ SOLN
50.0000 mg | Freq: Once | INTRAMUSCULAR | Status: AC
  Administered 2023-12-09: 50 mg via INTRAVENOUS
  Filled 2023-12-09: qty 1

## 2023-12-09 MED ORDER — FAMOTIDINE IN NACL 20-0.9 MG/50ML-% IV SOLN
20.0000 mg | Freq: Once | INTRAVENOUS | Status: AC
  Administered 2023-12-09: 20 mg via INTRAVENOUS
  Filled 2023-12-09: qty 50

## 2023-12-09 NOTE — Progress Notes (Signed)
 Okay to adjust weight in treatment plan to current weight (80.3kg) per Dr. Sherrod.  Harlene Nasuti, PharmD Oncology Infusion Pharmacist 12/09/2023 3:31 PM

## 2023-12-09 NOTE — Patient Instructions (Signed)
 [facility] - A DEPT OF [place]  Discharge Instructions: Thank you for choosing [facility] to provide your oncology and hematology care.   If you have a lab appointment with the Cancer Center, please go directly to the Cancer Center and check in at the registration area.   Wear comfortable clothing and clothing appropriate for easy access to any Portacath or PICC line.   We strive to give you quality time with your provider. You may need to reschedule your appointment if you arrive late (15 or more minutes).  Arriving late affects you and other patients whose appointments are after yours.  Also, if you miss three or more appointments without notifying the office, you may be dismissed from the clinic at the provider's discretion.      For prescription refill requests, have your pharmacy contact our office and allow 72 hours for refills to be completed.    Today you received the following chemotherapy and/or immunotherapy agents: CARBOplatin  (PARAPLATIN ) , PACLitaxel  (TAXOL )     To help prevent nausea and vomiting after your treatment, we encourage you to take your nausea medication as directed.  BELOW ARE SYMPTOMS THAT SHOULD BE REPORTED IMMEDIATELY: *FEVER GREATER THAN 100.4 F (38 C) OR HIGHER *CHILLS OR SWEATING *NAUSEA AND VOMITING THAT IS NOT CONTROLLED WITH YOUR NAUSEA MEDICATION *UNUSUAL SHORTNESS OF BREATH *UNUSUAL BRUISING OR BLEEDING *URINARY PROBLEMS (pain or burning when urinating, or frequent urination) *BOWEL PROBLEMS (unusual diarrhea, constipation, pain near the anus) TENDERNESS IN MOUTH AND THROAT WITH OR WITHOUT PRESENCE OF ULCERS (sore throat, sores in mouth, or a toothache) UNUSUAL RASH, SWELLING OR PAIN  UNUSUAL VAGINAL DISCHARGE OR ITCHING   Items with * indicate a potential emergency and should be followed up as soon as possible or go to the Emergency Department if any problems should occur.  Please show the  CHEMOTHERAPY ALERT CARD or IMMUNOTHERAPY ALERT CARD at check-in to the Emergency Department and triage nurse.  Should you have questions after your visit or need to cancel or reschedule your appointment, please contact [facility] - A DEPT OF [facility]  Dept: [phone]  and follow the prompts.  Office hours are 8:00 a.m. to 4:30 p.m. Monday - Friday. Please note that voicemails left after 4:00 p.m. may not be returned until the following business day.  We are closed weekends and major holidays. You have access to a nurse at all times for urgent questions. Please call the main number to the clinic Dept: [phone] and follow the prompts.   For any non-urgent questions, you may also contact your provider using MyChart. We now offer e-Visits for anyone 77 and older to request care online for non-urgent symptoms. For details visit [email or web address].   Also download the MyChart app! Go to the app store, search MyChart, open the app, select [place], and log in with your MyChart username and password.

## 2023-12-10 ENCOUNTER — Other Ambulatory Visit: Payer: Self-pay

## 2023-12-10 ENCOUNTER — Other Ambulatory Visit (HOSPITAL_BASED_OUTPATIENT_CLINIC_OR_DEPARTMENT_OTHER): Payer: Self-pay

## 2023-12-10 ENCOUNTER — Ambulatory Visit
Admission: RE | Admit: 2023-12-10 | Discharge: 2023-12-10 | Disposition: A | Discharge status: Home / Self Care | Source: Ambulatory Visit | Attending: Radiation Oncology | Admitting: Radiation Oncology

## 2023-12-10 ENCOUNTER — Ambulatory Visit: Payer: Self-pay | Admitting: *Deleted

## 2023-12-10 ENCOUNTER — Inpatient Hospital Stay: Admitting: Licensed Clinical Social Worker

## 2023-12-10 DIAGNOSIS — C3491 Malignant neoplasm of unspecified part of right bronchus or lung: Secondary | ICD-10-CM

## 2023-12-10 DIAGNOSIS — Z510 Encounter for antineoplastic radiation therapy: Secondary | ICD-10-CM | POA: Diagnosis not present

## 2023-12-10 LAB — RAD ONC ARIA SESSION SUMMARY
Course Elapsed Days: 2
Plan Fractions Treated to Date: 3
Plan Prescribed Dose Per Fraction: 2 Gy
Plan Total Fractions Prescribed: 33
Plan Total Prescribed Dose: 66 Gy
Reference Point Dosage Given to Date: 6 Gy
Reference Point Session Dosage Given: 2 Gy
Session Number: 3

## 2023-12-11 ENCOUNTER — Encounter: Payer: Self-pay | Admitting: Internal Medicine

## 2023-12-11 NOTE — Progress Notes (Signed)
 [facility] Clinical Social Work  Initial Assessment   Andrea Mullen is a 58 y.o. year old female contacted caregiver by phone. CSW spoke w/ pt's daughter Andrea Mullen to obtain details for assessment.  Clinical Social Work was referred by medical provider for assessment of psychosocial needs.   SDOH (Social Determinants of Health) assessments performed: Yes   SDOH Screenings   Food Insecurity: No Food Insecurity (11/12/2023)  Recent Concern: Food Insecurity - Food Insecurity Present (10/05/2023)   Received from [facility]  Housing: Low Risk  (11/12/2023)  Recent Concern: Housing - High Risk (10/05/2023)   Received from [facility]  Transportation Needs: No Transportation Needs (11/12/2023)  Utilities: Not At Risk (11/12/2023)  Depression (PHQ2-9): Low Risk  (12/09/2023)  Financial Resource Strain: Medium Risk (10/05/2023)   Received from [facility]  Physical Activity: Inactive (10/05/2023)   Received from [facility]  Social Connections: Somewhat Isolated (10/05/2023)   Received from [facility]  Stress: Stress Concern Present (10/05/2023)   Received from [facility]  Tobacco Use: Medium Risk (12/07/2023)    PHQ 2/9:    12/09/2023    2:14 PM 11/03/2023    9:16 AM 12/11/2021    9:39 AM  Depression screen PHQ 2/9  Decreased Interest 0 0 0  Down, Depressed, Hopeless 0 0 0  PHQ - 2 Score 0 0 0     Distress Screen completed: Yes    12/09/2023   12:43 PM  ONCBCN DISTRESS SCREENING  Screening Type Initial Screening  How much distress have you been experiencing in the past week? (0-10) 7  Practical concerns type Taking care of myself;Transportation;Having enough food  Emotional concerns type Anger  Physical Concerns Type  Pain;Sleep;Changes in eating      Family/Social Information:  Housing Arrangement: patient lives with two of her daughters and 1 grandchild.  Pt reportedly has a Pmhx significant for a stroke and possible dementia (undiagnosed).  Pt requires full  care. Family members/support persons in your life? Pt has 5 daughters who reportedly all assist as needed in pt's care.   Transportation concerns: no  Employment: Unemployed .  Income source: Special Educational Needs Teacher Income Financial concerns: Yes, current concerns Type of concern: Utilities and Rent/ mortgage Food access concerns: no Religious or spiritual practice: Yes-[employer]. Advanced directives: Not known Services Currently in place:  none  Coping/ Adjustment to diagnosis: Patient understands treatment plan and what happens next? yes Concerns about diagnosis and/or treatment: Overwhelmed by information and Quality of life Patient reported stressors: Finances, Anxiety/ nervousness, and Adjusting to my illness Hopes and/or priorities: pt's priority is to continue treatment w/ the hope of positive results.  Patient enjoys time with family/ friends Current coping skills/ strengths: Motivation for treatment/growth  and Supportive family/friends     SUMMARY: Current SDOH Barriers:  Financial constraints related to limited/fixed income.  Clinical Social Work Clinical Goal(s):  Explore community resource options for unmet needs related to:  Financial Strain   Interventions: Discussed common feeling and emotions when being diagnosed with cancer, and the importance of support during treatment Informed patient of the support team roles and support services at [facility] Provided CSW contact information and encouraged patient to call with any questions or concerns Pt receives SNAP benefits and meets presumptive eligibility for the [employer].  Daughter informed and will apply on behalf of pt.  [name] daughter informed of emotional support available if deemed appropriate.     Follow Up Plan: Patient will contact CSW with any support or resource  needs Patient verbalizes understanding of plan: Yes    Andrea JONELLE Manna, LCSW Clinical Social Worker [facility]  Patient is  participating in a Managed Medicaid Plan:  Yes

## 2023-12-13 ENCOUNTER — Inpatient Hospital Stay

## 2023-12-13 ENCOUNTER — Other Ambulatory Visit: Payer: Self-pay

## 2023-12-13 ENCOUNTER — Inpatient Hospital Stay: Admitting: Physician Assistant

## 2023-12-13 ENCOUNTER — Ambulatory Visit
Admission: RE | Admit: 2023-12-13 | Discharge: 2023-12-13 | Disposition: A | Discharge status: Home / Self Care | Source: Ambulatory Visit | Attending: Radiation Oncology | Admitting: Radiation Oncology

## 2023-12-13 DIAGNOSIS — Z510 Encounter for antineoplastic radiation therapy: Secondary | ICD-10-CM | POA: Insufficient documentation

## 2023-12-13 DIAGNOSIS — C3490 Malignant neoplasm of unspecified part of unspecified bronchus or lung: Secondary | ICD-10-CM

## 2023-12-13 DIAGNOSIS — C3491 Malignant neoplasm of unspecified part of right bronchus or lung: Secondary | ICD-10-CM

## 2023-12-13 LAB — RAD ONC ARIA SESSION SUMMARY
Course Elapsed Days: 5
Plan Fractions Treated to Date: 4
Plan Prescribed Dose Per Fraction: 2 Gy
Plan Total Fractions Prescribed: 33
Plan Total Prescribed Dose: 66 Gy
Reference Point Dosage Given to Date: 8 Gy
Reference Point Session Dosage Given: 2 Gy
Session Number: 4

## 2023-12-13 MED ORDER — SONAFINE EX EMUL
1.0000 | Freq: Once | CUTANEOUS | Status: AC
  Administered 2023-12-13: 1 via TOPICAL

## 2023-12-14 ENCOUNTER — Ambulatory Visit
Admission: RE | Admit: 2023-12-14 | Discharge: 2023-12-14 | Disposition: A | Discharge status: Home / Self Care | Source: Ambulatory Visit | Attending: Radiation Oncology | Admitting: Radiation Oncology

## 2023-12-14 ENCOUNTER — Ambulatory Visit

## 2023-12-14 ENCOUNTER — Other Ambulatory Visit: Payer: Self-pay

## 2023-12-14 LAB — RAD ONC ARIA SESSION SUMMARY
Course Elapsed Days: 6
Plan Fractions Treated to Date: 5
Plan Prescribed Dose Per Fraction: 2 Gy
Plan Total Fractions Prescribed: 33
Plan Total Prescribed Dose: 66 Gy
Reference Point Dosage Given to Date: 10 Gy
Reference Point Session Dosage Given: 2 Gy
Session Number: 5

## 2023-12-15 ENCOUNTER — Ambulatory Visit

## 2023-12-15 ENCOUNTER — Ambulatory Visit
Admission: RE | Admit: 2023-12-15 | Discharge: 2023-12-15 | Disposition: A | Discharge status: Home / Self Care | Source: Ambulatory Visit | Attending: Radiation Oncology | Admitting: Radiation Oncology

## 2023-12-15 ENCOUNTER — Other Ambulatory Visit: Payer: Self-pay

## 2023-12-15 LAB — RAD ONC ARIA SESSION SUMMARY
Course Elapsed Days: 7
Plan Fractions Treated to Date: 6
Plan Prescribed Dose Per Fraction: 2 Gy
Plan Total Fractions Prescribed: 33
Plan Total Prescribed Dose: 66 Gy
Reference Point Dosage Given to Date: 12 Gy
Reference Point Session Dosage Given: 2 Gy
Session Number: 6

## 2023-12-16 ENCOUNTER — Inpatient Hospital Stay

## 2023-12-16 ENCOUNTER — Inpatient Hospital Stay: Attending: Internal Medicine

## 2023-12-16 ENCOUNTER — Ambulatory Visit
Admission: RE | Admit: 2023-12-16 | Discharge: 2023-12-16 | Disposition: A | Discharge status: Home / Self Care | Source: Ambulatory Visit | Attending: Radiation Oncology | Admitting: Radiation Oncology

## 2023-12-16 ENCOUNTER — Other Ambulatory Visit: Payer: Self-pay

## 2023-12-16 VITALS — BP 114/76 | HR 69 | Temp 98.2°F | Resp 17 | Wt 202.2 lb

## 2023-12-16 DIAGNOSIS — G4700 Insomnia, unspecified: Secondary | ICD-10-CM | POA: Insufficient documentation

## 2023-12-16 DIAGNOSIS — C3491 Malignant neoplasm of unspecified part of right bronchus or lung: Secondary | ICD-10-CM

## 2023-12-16 DIAGNOSIS — D6959 Other secondary thrombocytopenia: Secondary | ICD-10-CM | POA: Insufficient documentation

## 2023-12-16 DIAGNOSIS — Z5111 Encounter for antineoplastic chemotherapy: Secondary | ICD-10-CM | POA: Insufficient documentation

## 2023-12-16 DIAGNOSIS — Z79899 Other long term (current) drug therapy: Secondary | ICD-10-CM | POA: Insufficient documentation

## 2023-12-16 DIAGNOSIS — R1310 Dysphagia, unspecified: Secondary | ICD-10-CM | POA: Insufficient documentation

## 2023-12-16 DIAGNOSIS — C3411 Malignant neoplasm of upper lobe, right bronchus or lung: Secondary | ICD-10-CM | POA: Insufficient documentation

## 2023-12-16 LAB — CBC WITH DIFFERENTIAL (CANCER CENTER ONLY)
Abs Immature Granulocytes: 0.02 K/uL (ref 0.00–0.07)
Basophils Absolute: 0 K/uL (ref 0.0–0.1)
Basophils Relative: 0 %
Eosinophils Absolute: 0 K/uL (ref 0.0–0.5)
Eosinophils Relative: 2 %
HCT: 31.1 % — ABNORMAL LOW (ref 36.0–46.0)
Hemoglobin: 10.3 g/dL — ABNORMAL LOW (ref 12.0–15.0)
Immature Granulocytes: 1 %
Lymphocytes Relative: 18 %
Lymphs Abs: 0.5 K/uL — ABNORMAL LOW (ref 0.7–4.0)
MCH: 28.4 pg (ref 26.0–34.0)
MCHC: 33.1 g/dL (ref 30.0–36.0)
MCV: 85.7 fL (ref 80.0–100.0)
Monocytes Absolute: 0.1 K/uL (ref 0.1–1.0)
Monocytes Relative: 4 %
Neutro Abs: 2.1 K/uL (ref 1.7–7.7)
Neutrophils Relative %: 75 %
Platelet Count: 149 K/uL — ABNORMAL LOW (ref 150–400)
RBC: 3.63 MIL/uL — ABNORMAL LOW (ref 3.87–5.11)
RDW: 14.6 % (ref 11.5–15.5)
WBC Count: 2.7 K/uL — ABNORMAL LOW (ref 4.0–10.5)
nRBC: 0 % (ref 0.0–0.2)

## 2023-12-16 LAB — RAD ONC ARIA SESSION SUMMARY
Course Elapsed Days: 8
Plan Fractions Treated to Date: 7
Plan Prescribed Dose Per Fraction: 2 Gy
Plan Total Fractions Prescribed: 33
Plan Total Prescribed Dose: 66 Gy
Reference Point Dosage Given to Date: 14 Gy
Reference Point Session Dosage Given: 2 Gy
Session Number: 7

## 2023-12-16 LAB — CMP (CANCER CENTER ONLY)
ALT: 10 U/L (ref 0–44)
AST: 16 U/L (ref 15–41)
Albumin: 3.8 g/dL (ref 3.5–5.0)
Alkaline Phosphatase: 54 U/L (ref 38–126)
Anion gap: 6 (ref 5–15)
BUN: 9 mg/dL (ref 6–20)
CO2: 28 mmol/L (ref 22–32)
Calcium: 9.5 mg/dL (ref 8.9–10.3)
Chloride: 102 mmol/L (ref 98–111)
Creatinine: 0.61 mg/dL (ref 0.44–1.00)
GFR, Estimated: >60 mL/min (ref >60)
Glucose, Bld: 104 mg/dL — ABNORMAL HIGH (ref 70–99)
Potassium: 3.9 mmol/L (ref 3.5–5.1)
Sodium: 136 mmol/L (ref 135–145)
Total Bilirubin: 0.8 mg/dL (ref 0.0–1.2)
Total Protein: 7.8 g/dL (ref 6.5–8.1)

## 2023-12-16 MED ORDER — DIPHENHYDRAMINE HCL 50 MG/ML IJ SOLN
50.0000 mg | Freq: Once | INTRAMUSCULAR | Status: AC
  Administered 2023-12-16: 50 mg via INTRAVENOUS
  Filled 2023-12-16: qty 1

## 2023-12-16 MED ORDER — SODIUM CHLORIDE 0.9 % IV SOLN
45.0000 mg/m2 | Freq: Once | INTRAVENOUS | Status: AC
  Administered 2023-12-16: 84 mg via INTRAVENOUS
  Filled 2023-12-16: qty 14

## 2023-12-16 MED ORDER — FAMOTIDINE IN NACL 20-0.9 MG/50ML-% IV SOLN
20.0000 mg | Freq: Once | INTRAVENOUS | Status: AC
  Administered 2023-12-16: 20 mg via INTRAVENOUS
  Filled 2023-12-16: qty 50

## 2023-12-16 MED ORDER — SODIUM CHLORIDE 0.9 % IV SOLN: INTRAVENOUS | Status: DC

## 2023-12-16 MED ORDER — PALONOSETRON HCL INJECTION 0.25 MG/5ML
0.2500 mg | Freq: Once | INTRAVENOUS | Status: AC
  Administered 2023-12-16: 0.25 mg via INTRAVENOUS
  Filled 2023-12-16: qty 5

## 2023-12-16 MED ORDER — SODIUM CHLORIDE 0.9 % IV SOLN
246.8000 mg | Freq: Once | INTRAVENOUS | Status: AC
  Administered 2023-12-16: 250 mg via INTRAVENOUS
  Filled 2023-12-16: qty 25

## 2023-12-16 NOTE — Patient Instructions (Signed)
 [facility] - A DEPT OF [facility]  Discharge Instructions: Thank you for choosing [facility] to provide your oncology and hematology care.   If you have a lab appointment with the Cancer Center, please go directly to the Cancer Center and check in at the registration area.   Wear comfortable clothing and clothing appropriate for easy access to any Portacath or PICC line.   We strive to give you quality time with your provider. You may need to reschedule your appointment if you arrive late (15 or more minutes).  Arriving late affects you and other patients whose appointments are after yours.  Also, if you miss three or more appointments without notifying the office, you may be dismissed from the clinic at the provider's discretion.      For prescription refill requests, have your pharmacy contact our office and allow 72 hours for refills to be completed.    Today you received the following chemotherapy and/or immunotherapy agents: Paclitaxel and Carboplatin      To help prevent nausea and vomiting after your treatment, we encourage you to take your nausea medication as directed.  BELOW ARE SYMPTOMS THAT SHOULD BE REPORTED IMMEDIATELY: *FEVER GREATER THAN 100.4 F (38 C) OR HIGHER *CHILLS OR SWEATING *NAUSEA AND VOMITING THAT IS NOT CONTROLLED WITH YOUR NAUSEA MEDICATION *UNUSUAL SHORTNESS OF BREATH *UNUSUAL BRUISING OR BLEEDING *URINARY PROBLEMS (pain or burning when urinating, or frequent urination) *BOWEL PROBLEMS (unusual diarrhea, constipation, pain near the anus) TENDERNESS IN MOUTH AND THROAT WITH OR WITHOUT PRESENCE OF ULCERS (sore throat, sores in mouth, or a toothache) UNUSUAL RASH, SWELLING OR PAIN  UNUSUAL VAGINAL DISCHARGE OR ITCHING   Items with * indicate a potential emergency and should be followed up as soon as possible or go to the Emergency Department if any problems should occur.  Please show the CHEMOTHERAPY ALERT CARD  or IMMUNOTHERAPY ALERT CARD at check-in to the Emergency Department and triage nurse.  Should you have questions after your visit or need to cancel or reschedule your appointment, please contact [facility] - A DEPT OF [facility]  Dept: [phone]  and follow the prompts.  Office hours are 8:00 a.m. to 4:30 p.m. Monday - Friday. Please note that voicemails left after 4:00 p.m. may not be returned until the following business day.  We are closed weekends and major holidays. You have access to a nurse at all times for urgent questions. Please call the main number to the clinic Dept: [phone] and follow the prompts.   For any non-urgent questions, you may also contact your provider using MyChart. We now offer e-Visits for anyone 54 and older to request care online for non-urgent symptoms. For details visit [email or web address].   Also download the MyChart app! Go to the app store, search "MyChart", open the app, select [place], and log in with your MyChart username and password.

## 2023-12-17 ENCOUNTER — Ambulatory Visit
Admission: RE | Admit: 2023-12-17 | Discharge: 2023-12-17 | Disposition: A | Discharge status: Home / Self Care | Source: Ambulatory Visit | Attending: Radiation Oncology | Admitting: Radiation Oncology

## 2023-12-17 ENCOUNTER — Other Ambulatory Visit: Payer: Self-pay

## 2023-12-17 LAB — RAD ONC ARIA SESSION SUMMARY
Course Elapsed Days: 9
Plan Fractions Treated to Date: 8
Plan Prescribed Dose Per Fraction: 2 Gy
Plan Total Fractions Prescribed: 33
Plan Total Prescribed Dose: 66 Gy
Reference Point Dosage Given to Date: 16 Gy
Reference Point Session Dosage Given: 2 Gy
Session Number: 8

## 2023-12-20 ENCOUNTER — Ambulatory Visit

## 2023-12-20 ENCOUNTER — Other Ambulatory Visit

## 2023-12-20 ENCOUNTER — Ambulatory Visit
Admission: RE | Admit: 2023-12-20 | Discharge: 2023-12-20 | Disposition: A | Discharge status: Home / Self Care | Source: Ambulatory Visit | Attending: Radiation Oncology | Admitting: Radiation Oncology

## 2023-12-20 ENCOUNTER — Other Ambulatory Visit: Payer: Self-pay | Admitting: Radiation Oncology

## 2023-12-20 ENCOUNTER — Other Ambulatory Visit: Payer: Self-pay

## 2023-12-20 ENCOUNTER — Other Ambulatory Visit (HOSPITAL_BASED_OUTPATIENT_CLINIC_OR_DEPARTMENT_OTHER): Payer: Self-pay

## 2023-12-20 DIAGNOSIS — C3411 Malignant neoplasm of upper lobe, right bronchus or lung: Secondary | ICD-10-CM

## 2023-12-20 LAB — RAD ONC ARIA SESSION SUMMARY
Course Elapsed Days: 12
Plan Fractions Treated to Date: 9
Plan Prescribed Dose Per Fraction: 2 Gy
Plan Total Fractions Prescribed: 33
Plan Total Prescribed Dose: 66 Gy
Reference Point Dosage Given to Date: 18 Gy
Reference Point Session Dosage Given: 2 Gy
Session Number: 9

## 2023-12-20 MED ORDER — NICOTINE 14 MG/24HR TD PT24
14.0000 mg | MEDICATED_PATCH | Freq: Every day | TRANSDERMAL | 2 refills | Status: AC
  Filled 2023-12-20: qty 14, 14d supply, fill #0

## 2023-12-20 MED ORDER — NICOTINE 7 MG/24HR TD PT24
7.0000 mg | MEDICATED_PATCH | Freq: Every day | TRANSDERMAL | 0 refills | Status: AC
  Filled 2023-12-20: qty 14, 14d supply, fill #0

## 2023-12-20 NOTE — Progress Notes (Signed)
 TOBACCO CESSATION COUNSELING: I asked the patient today about tobacco use. The patient uses tobacco.  I advised the patient to quit. Services were offered by me today including outpatient counseling and pharmacotherapy. I assessed for the willingness to attempt to quit and provided encouragement and demonstrated willingness to make referrals and/or prescriptions to help the patient attempt to quit. The patient has follow-up with the oncologic team to touch base on their tobacco use and /or cessation efforts.  Over 3 minutes were spent on this issue. She will quit today.  Nicotine  patches Rx'd today.  -----------------------------------  Lauraine Golden, MD

## 2023-12-21 ENCOUNTER — Telehealth: Payer: Self-pay | Admitting: Radiation Oncology

## 2023-12-21 ENCOUNTER — Ambulatory Visit

## 2023-12-21 ENCOUNTER — Other Ambulatory Visit: Payer: Self-pay | Admitting: Physician Assistant

## 2023-12-21 NOTE — Telephone Encounter (Signed)
 VM received from pt's daughter advising pt would be missing today's tx appt due to family emergency. They were on the way to [facility] and pt's daughter requested c/b for confirmation. VM forwarded to Support Rtt and L4.

## 2023-12-21 NOTE — Progress Notes (Deleted)
 [facility] OFFICE PROGRESS NOTE  Llc, [facility] [address] [place] [address]  DIAGNOSIS: ***  PRIOR THERAPY:  CURRENT THERAPY:  INTERVAL HISTORY: Andrea Mullen 58 y.o. female returns for *** regular *** visit for followup of ***   MEDICAL HISTORY: Past Medical History:  Diagnosis Date   Anxiety    Asthma    Blind    left eye   Cancer (HCC)    lung, tx radiation and chemo   Hyperlipemia    Hypertension    Stroke (HCC)     ALLERGIES:  is allergic to bicillin c-r, penicillins, nsaids, and prednisone .  MEDICATIONS:  Current Outpatient Medications  Medication Sig Dispense Refill   albuterol  (PROVENTIL ) (2.5 MG/3ML) 0.083% nebulizer solution Take 3 mLs (2.5 mg total) by nebulization every 6 (six) hours as needed for wheezing or shortness of breath. (Patient not taking: Reported on 11/30/2023) 75 mL 12   albuterol  (VENTOLIN  HFA) 108 (90 Base) MCG/ACT inhaler Inhale 2 puffs into the lungs every 4 (four) hours as needed for wheezing or shortness of breath. 18 g 0   albuterol  (VENTOLIN  HFA) 108 (90 Base) MCG/ACT inhaler Inhale 2 puffs into the lungs every 6 (six) hours as needed for wheezing or shortness of breath. 8.5 g 2   apixaban  (ELIQUIS ) 5 MG TABS tablet Take 1 tablet (5 mg total) by mouth 2 (two) times daily.NO MORE REFILLS. MUST FOLLOW UP WITH PCP OR ONCOLOGIST. 60 tablet 0   cyclobenzaprine  (FLEXERIL ) 5 MG tablet Take 1 tablet (5 mg total) by mouth 3 (three) times daily as needed for muscle spasms. 30 tablet 0   HYDROcodone -acetaminophen  (NORCO/VICODIN) 5-325 MG tablet Take 1 tablet by mouth every 6 (six) hours as needed for moderate pain (pain score 4-6). 20 tablet 0   lidocaine -prilocaine  (EMLA ) cream Apply to affected area once 30 g 3   nicotine  (NICODERM CQ  - DOSED IN MG/24 HOURS) 14 mg/24hr patch Place 1 patch (14 mg total) onto the skin daily. Apply 14mg  patch daily x 6 wk, then 7 mg patch daily x 2 wk 14 patch 2    nicotine  (NICODERM CQ  - DOSED IN MG/24 HR) 7 mg/24hr patch Place 1 patch (7 mg total) onto the skin daily. Apply 14mg  patch daily x 6 wk, then 7 mg patch daily x 2 wk 14 patch 0   Nystatin (GERHARDT'S BUTT CREAM) CREA Apply 1 Application topically 3 (three) times daily. Apply to affected area 60 each 3   ondansetron  (ZOFRAN ) 8 MG tablet Take 1 tablet (8 mg total) by mouth every 8 (eight) hours as needed for nausea or vomiting. Start on the third day after chemotherapy. 30 tablet 1   polyethylene glycol powder (GLYCOLAX /MIRALAX ) 17 GM/SCOOP powder Take 17 g by mouth 2 (two) times daily as needed for mild constipation or moderate constipation. Dissolve 1 capful (17g) in 4-8 ounces of liquid and take by mouth daily. (Patient not taking: Reported on 11/30/2023) 238 g 0   prochlorperazine  (COMPAZINE ) 10 MG tablet Take 1 tablet (10 mg total) by mouth every 6 (six) hours as needed. 30 tablet 2   rosuvastatin  (CRESTOR ) 40 MG tablet Take 1 tablet (40 mg total) by mouth daily. (Patient not taking: Reported on 11/30/2023) 30 tablet 3   senna-docusate (SENOKOT-S) 8.6-50 MG tablet Take 1 tablet by mouth 2 (two) times daily. 60 tablet 0   No current facility-administered medications for this visit.    SURGICAL HISTORY:  Past Surgical History:  Procedure Laterality Date   CARDIAC CATHETERIZATION N/A 11/01/2014   Procedure: Left Heart Cath and Coronary Angiography;  Surgeon: Victory LELON Sharps, MD;  Location: [facility] INVASIVE CV LAB;  Service: Cardiovascular;  Laterality: N/A;   CESAREAN SECTION     x3   HERNIA REPAIR     VIDEO BRONCHOSCOPY WITH ENDOBRONCHIAL ULTRASOUND Right 10/22/2023   Procedure: BRONCHOSCOPY, WITH EBUS;  Surgeon: Jude Harden GAILS, MD;  Location: [facility] ENDOSCOPY;  Service: Cardiopulmonary;  Laterality: Right;    REVIEW OF SYSTEMS:   Review of Systems  Constitutional: Negative for appetite change, chills, fatigue, fever and unexpected weight change.  HENT:   Negative for mouth sores, nosebleeds, sore  throat and trouble swallowing.   Eyes: Negative for eye problems and icterus.  Respiratory: Negative for cough, hemoptysis, shortness of breath and wheezing.   Cardiovascular: Negative for chest pain and leg swelling.  Gastrointestinal: Negative for abdominal pain, constipation, diarrhea, nausea and vomiting.  Genitourinary: Negative for bladder incontinence, difficulty urinating, dysuria, frequency and hematuria.   Musculoskeletal: Negative for back pain, gait problem, neck pain and neck stiffness.  Skin: Negative for itching and rash.  Neurological: Negative for dizziness, extremity weakness, gait problem, headaches, light-headedness and seizures.  Hematological: Negative for adenopathy. Does not bruise/bleed easily.  Psychiatric/Behavioral: Negative for confusion, depression and sleep disturbance. The patient is not nervous/anxious.     PHYSICAL EXAMINATION:  Last menstrual period 11/16/2016.  ECOG PERFORMANCE STATUS: {[facility]L ONC ECOG [id number]  Physical Exam  Constitutional: Oriented to person, place, and time and well-developed, well-nourished, and in no distress. No distress.  HENT:  Head: Normocephalic and atraumatic.  Mouth/Throat: Oropharynx is clear and moist. No oropharyngeal exudate.  Eyes: Conjunctivae are normal. Right eye exhibits no discharge. Left eye exhibits no discharge. No scleral icterus.  Neck: Normal range of motion. Neck supple.  Cardiovascular: Normal rate, regular rhythm, normal heart sounds and intact distal pulses.   Pulmonary/Chest: Effort normal and breath sounds normal. No respiratory distress. No wheezes. No rales.  Abdominal: Soft. Bowel sounds are normal. Exhibits no distension and no mass. There is no tenderness.  Musculoskeletal: Normal range of motion. Exhibits no edema.  Lymphadenopathy:    No cervical adenopathy.  Neurological: Alert and oriented to person, place, and time. Exhibits normal muscle tone. Gait normal. Coordination normal.  Skin:  Skin is warm and dry. No rash noted. Not diaphoretic. No erythema. No pallor.  Psychiatric: Mood, memory and judgment normal.  Vitals reviewed.  LABORATORY DATA: Lab Results  Component Value Date   WBC 2.7 (L) 12/16/2023   HGB 10.3 (L) 12/16/2023   HCT 31.1 (L) 12/16/2023   MCV 85.7 12/16/2023   PLT 149 (L) 12/16/2023      Chemistry      Component Value Date/Time   NA 136 12/16/2023 0803   K 3.9 12/16/2023 0803   CL 102 12/16/2023 0803   CO2 28 12/16/2023 0803   BUN 9 12/16/2023 0803   CREATININE 0.61 12/16/2023 0803   CREATININE 0.72 04/03/2021 0000      Component Value Date/Time   CALCIUM  9.5 12/16/2023 0803   ALKPHOS 54 12/16/2023 0803   AST 16 12/16/2023 0803   ALT 10 12/16/2023 0803   BILITOT 0.8 12/16/2023 0803       RADIOGRAPHIC STUDIES:  CT Lumbar Spine Wo Contrast Result Date: 12/07/2023 CLINICAL DATA:  Right history: Back trauma, no prior imaging (Age >= 16y) EXAM: CT LUMBAR SPINE WITHOUT CONTRAST TECHNIQUE: Multidetector CT imaging of the lumbar spine was performed without  intravenous contrast administration. Multiplanar CT image reconstructions were also generated. RADIATION DOSE REDUCTION: This exam was performed according to the departmental dose-optimization program which includes automated exposure control, adjustment of the mA and/or kV according to patient size and/or use of iterative reconstruction technique. COMPARISON:  None Available. FINDINGS: Segmentation: 5 lumbar type vertebrae. Alignment: Exaggerated lumbar lordosis. No traumatic subluxation. No listhesis. Vertebrae: No acute fracture. Vertebral body heights are normal. Posterior elements are intact. No evidence of focal bone lesion Paraspinal and other soft tissues: Trace right pleural effusion, unchanged or diminished from recent PET. Abdominal aortic atherosclerosis. Disc levels: Slight L3-L4 disc bulge. Mild multilevel facet hypertrophy. No high-grade canal stenosis. IMPRESSION: 1. No acute  fracture or subluxation of the lumbar spine. 2. Trace right pleural effusion, unchanged or diminished from recent PET. Aortic Atherosclerosis (ICD10-I70.0). Electronically Signed   By: Andrea Gasman M.D.   On: 12/07/2023 18:00   CT Cervical Spine Wo Contrast Result Date: 12/07/2023 CLINICAL DATA:  Neck trauma, dangerous injury mechanism (Age 60-64y) EXAM: CT CERVICAL SPINE WITHOUT CONTRAST TECHNIQUE: Multidetector CT imaging of the cervical spine was performed without intravenous contrast. Multiplanar CT image reconstructions were also generated. RADIATION DOSE REDUCTION: This exam was performed according to the departmental dose-optimization program which includes automated exposure control, adjustment of the mA and/or kV according to patient size and/or use of iterative reconstruction technique. COMPARISON:  11/11/2023 FINDINGS: Alignment: Chronic straightening of normal lordosis. No traumatic subluxation. Skull base and vertebrae: No acute fracture. Vertebral body heights are maintained. The dens and skull base are intact. No evidence of focal bone lesion. Soft tissues and spinal canal: No prevertebral fluid or swelling. No visible canal hematoma. Disc levels: The disc spaces are preserved. No spinal canal stenosis per Upper chest: No acute findings. Known right upper lobe mass is minimally included in the inferior field of view. Other: None. IMPRESSION: No acute fracture or traumatic subluxation of the cervical spine. Electronically Signed   By: Andrea Gasman M.D.   On: 12/07/2023 17:57   CT Head Wo Contrast Result Date: 12/07/2023 CLINICAL DATA:  Head trauma, moderate-severe EXAM: CT HEAD WITHOUT CONTRAST TECHNIQUE: Contiguous axial images were obtained from the base of the skull through the vertex without intravenous contrast. RADIATION DOSE REDUCTION: This exam was performed according to the departmental dose-optimization program which includes automated exposure control, adjustment of the mA  and/or kV according to patient size and/or use of iterative reconstruction technique. COMPARISON:  Head CT 11/11/2023, MRI 10/19/2023 FINDINGS: Brain: No evidence of acute hemorrhage. Stable degree of atrophy and chronic small vessel ischemia. Remote lacunar infarcts in the basal ganglia. No evidence of acute ischemia. No subdural or extra-axial collection. No evidence of midline shift or mass effect. Vascular: Atherosclerosis of skullbase vasculature without hyperdense vessel or abnormal calcification. Skull: No fracture or focal lesion. Sinuses/Orbits: Opacification of the right greater than left mastoid air cells, chronic, not significantly changed. No acute findings. Other: No confluent scalp hematoma. IMPRESSION: 1. No acute intracranial abnormality. No skull fracture. 2. Stable atrophy and chronic small vessel ischemia. Electronically Signed   By: Andrea Gasman M.D.   On: 12/07/2023 17:54   DG Pelvis Portable Result Date: 12/07/2023 EXAM: 1 or 2 VIEW(S) XRAY OF THE PELVIS 12/07/2023 05:31:00 PM COMPARISON: CT 10/25/2018. CLINICAL HISTORY: Fall. Triage notes: Pt presents from home with family. Family reports pt fell out of bed unwitnessed. Hx of dementia. Pt is not sure how she got on the floor. NO LOC, unsure if she hit head. Complains of  lower back pain. Family on scene making comments about having pt placed in facility. Alert and oriented x3. Is on blood thinners. FINDINGS: LIMITATIONS/ARTIFACTS: BONES AND JOINTS: Iliac crests are incompletely included. SI joints are non-widened. Pubic symphysis and rami are intact. No definitive fracture or malalignment. No focal osseous lesion. No joint dislocation. SOFT TISSUES: The soft tissues are unremarkable. IMPRESSION: 1. No acute fracture or malalignment identified. Cross-sectional imaging follow-up if persistent concern for hip or pelvic fracture 2. Incompletely included iliac crests. Electronically signed by: Luke Bun MD 12/07/2023 05:43 PM EDT RP  Workstation: [id number]   DG Chest Portable 1 View Result Date: 12/07/2023 EXAM: 1 VIEW(S) XRAY OF THE CHEST 12/07/2023 05:31:00 PM COMPARISON: Chest x-ray 10/23/2020, PET/CT 11/30/2020. CLINICAL HISTORY: fall. Triage notes:Pt presents from home with family. Family reports pt fell out of bed unwitnessed. Hx of dementia. Pt is not sure how she got on the floor. NO LOC, unsure if she hit head. Complains of lower back pain. Family on scene making comments about ; having pt placed in facility. Alert and oriented x3. Is on blood thinners . ; Best images obtained as possible due to patient's lack of cooperation. FINDINGS: LUNGS AND PLEURA: Redemonstrated large right upper lobe cavitary mass with interval opacification compared to the prior chest radiograph. Streaky atelectasis at the bases. No pulmonary edema. No pleural effusion. No pneumothorax. HEART AND MEDIASTINUM: Stable cardiomegaly with aortic atherosclerosis. BONES AND SOFT TISSUES: No acute osseous abnormality. IMPRESSION: 1. Large right upper lobe cavitary mass with new internal opacification, corresponding to history of known malignancy 2. Bibasilar atelectasis or scarring.  Small right effusion . 3. Cardiomegaly with aortic atherosclerosis. 4. No pneumothorax. Electronically signed by: Luke Bun MD 12/07/2023 05:41 PM EDT RP Workstation: [id number]   NM PET Image Initial (PI) Skull Base To Thigh Result Date: 11/30/2023 CLINICAL DATA:  Initial treatment strategy for non-small cell lung cancer. EXAM: NUCLEAR MEDICINE PET SKULL BASE TO THIGH TECHNIQUE: 8.69 mCi F-18 FDG was injected intravenously. Full-ring PET imaging was performed from the skull base to thigh after the radiotracer. CT data was obtained and used for attenuation correction and anatomic localization. Fasting blood glucose: 103 mg/dl COMPARISON:  Chest CTA 11/11/2023 and 10/18/2023. Abdominopelvic CT 10/25/2023. Chest CT 11/09/2021 FINDINGS: Mediastinal blood pool activity: SUV max  2.5 NECK: No hypermetabolic cervical lymph nodes are identified. No suspicious activity identified within the pharyngeal mucosal space. Incidental CT findings: none CHEST: Centrally necrotic right paratracheal lymph node measures 3.2 x 3.1 cm on image 49/4 demonstrates moderate peripheral hypermetabolic activity (SUV max 9.6). No other enlarged or hypermetabolic mediastinal, hilar or axillary lymph nodes. A centrally cavitary mass posteriorly in the right upper lobe measures 6.2 x 5.3 cm on image 48/4 and demonstrates peripheral hypermetabolic activity with an SUV max of 14.2. Stable 6 mm ground-glass nodule in the right middle lobe on image 36/7, too small to evaluate by PET-CT. No hypermetabolic pulmonary activity or suspicious left lung nodularity. Incidental CT findings: Atherosclerosis of the aorta, great vessels and coronary arteries. Dense calcifications of the aortic valve. No significant changes small dependent right pleural effusion without associated hypermetabolic activity. ABDOMEN/PELVIS: There is no hypermetabolic activity within the liver, adrenal glands, spleen or pancreas. There is no hypermetabolic nodal activity in the abdomen or pelvis. Incidental CT findings: Aortic and branch vessel atherosclerosis. Multiple partially calcified uterine fibroids. Postsurgical changes from previous ventral hernia repair. There are diverticular changes within the distal colon. SKELETON: There is no hypermetabolic activity to suggest osseous metastatic disease. Incidental  CT findings: Multilevel spondylosis. Unchanged probable sebaceous cyst in the lower left back measuring 4.4 cm on image 101/4, without hypermetabolic activity. IMPRESSION: 1. Hypermetabolic cavitary mass posteriorly in the right upper lobe consistent with known primary bronchogenic carcinoma. 2. Hypermetabolic centrally necrotic right paratracheal lymph node consistent with nodal metastatic disease. 3. No evidence of distant metastatic disease. 4.  Stable small right pleural effusion. 5. Stable small ground-glass right middle lobe nodule, too small to evaluate by PET-CT. Recommend attention on follow-up. 6.  Aortic Atherosclerosis (ICD10-I70.0). Electronically Signed   By: Elsie Perone M.D.   On: 11/30/2023 12:06     ASSESSMENT/PLAN:  No problem-specific Assessment & Plan notes found for this encounter.   No orders of the defined types were placed in this encounter.    I spent {CHL ONC TIME VISIT - DTPQU:[id number]} counseling the patient face to face. The total time spent in the appointment was {CHL ONC TIME VISIT - DTPQU:[id number]}.  Angelise Petrich L Omkar Stratmann, PA-C 12/21/23

## 2023-12-22 ENCOUNTER — Ambulatory Visit
Admission: RE | Admit: 2023-12-22 | Discharge: 2023-12-22 | Disposition: A | Discharge status: Home / Self Care | Source: Ambulatory Visit | Attending: Radiation Oncology | Admitting: Radiation Oncology

## 2023-12-22 ENCOUNTER — Other Ambulatory Visit: Payer: Self-pay

## 2023-12-22 LAB — RAD ONC ARIA SESSION SUMMARY
Course Elapsed Days: 14
Plan Fractions Treated to Date: 10
Plan Prescribed Dose Per Fraction: 2 Gy
Plan Total Fractions Prescribed: 33
Plan Total Prescribed Dose: 66 Gy
Reference Point Dosage Given to Date: 20 Gy
Reference Point Session Dosage Given: 2 Gy
Session Number: 10

## 2023-12-23 ENCOUNTER — Inpatient Hospital Stay

## 2023-12-23 ENCOUNTER — Inpatient Hospital Stay: Admitting: Internal Medicine

## 2023-12-23 ENCOUNTER — Ambulatory Visit

## 2023-12-24 ENCOUNTER — Ambulatory Visit

## 2023-12-27 ENCOUNTER — Other Ambulatory Visit

## 2023-12-27 ENCOUNTER — Ambulatory Visit

## 2023-12-27 ENCOUNTER — Ambulatory Visit: Admitting: Internal Medicine

## 2023-12-28 ENCOUNTER — Ambulatory Visit

## 2023-12-29 ENCOUNTER — Ambulatory Visit

## 2023-12-29 ENCOUNTER — Inpatient Hospital Stay

## 2023-12-29 ENCOUNTER — Ambulatory Visit: Admission: RE | Admit: 2023-12-29 | Source: Ambulatory Visit

## 2023-12-29 ENCOUNTER — Telehealth: Payer: Self-pay | Admitting: Radiation Oncology

## 2023-12-29 NOTE — Telephone Encounter (Signed)
 11/19 Called patient's daughter after received voicemail for confirm date/time for her treatment appt. If she was here on today.  Patient sch on 11/20. Patient's daughter is aware.

## 2023-12-30 ENCOUNTER — Other Ambulatory Visit (HOSPITAL_BASED_OUTPATIENT_CLINIC_OR_DEPARTMENT_OTHER): Payer: Self-pay

## 2023-12-30 ENCOUNTER — Inpatient Hospital Stay

## 2023-12-30 ENCOUNTER — Ambulatory Visit
Admission: RE | Admit: 2023-12-30 | Discharge: 2023-12-30 | Disposition: A | Discharge status: Home / Self Care | Source: Ambulatory Visit | Attending: Radiation Oncology | Admitting: Radiation Oncology

## 2023-12-30 ENCOUNTER — Ambulatory Visit

## 2023-12-30 ENCOUNTER — Other Ambulatory Visit: Payer: Self-pay

## 2023-12-30 LAB — RAD ONC ARIA SESSION SUMMARY
Course Elapsed Days: 22
Plan Fractions Treated to Date: 11
Plan Prescribed Dose Per Fraction: 2 Gy
Plan Total Fractions Prescribed: 33
Plan Total Prescribed Dose: 66 Gy
Reference Point Dosage Given to Date: 22 Gy
Reference Point Session Dosage Given: 2 Gy
Session Number: 11

## 2023-12-31 ENCOUNTER — Ambulatory Visit
Admission: RE | Admit: 2023-12-31 | Discharge: 2023-12-31 | Disposition: A | Discharge status: Home / Self Care | Source: Ambulatory Visit | Attending: Radiation Oncology | Admitting: Radiation Oncology

## 2023-12-31 ENCOUNTER — Telehealth: Payer: Self-pay

## 2023-12-31 ENCOUNTER — Encounter: Payer: Self-pay | Admitting: Internal Medicine

## 2023-12-31 ENCOUNTER — Other Ambulatory Visit: Payer: Self-pay

## 2023-12-31 LAB — RAD ONC ARIA SESSION SUMMARY
Course Elapsed Days: 23
Plan Fractions Treated to Date: 12
Plan Prescribed Dose Per Fraction: 2 Gy
Plan Total Fractions Prescribed: 33
Plan Total Prescribed Dose: 66 Gy
Reference Point Dosage Given to Date: 24 Gy
Reference Point Session Dosage Given: 2 Gy
Session Number: 12

## 2023-12-31 NOTE — Telephone Encounter (Signed)
 Received a message from Radiation Oncology regarding the patient experiencing nausea. Contacted the patient's daughter for follow-up. Daughter reports the patient has had no nausea or vomiting for approximately one week. She states the patient is taking Zofran  and Compazine  as prescribed, and symptoms are well controlled. Reviewed and reminded the daughter of the patient's upcoming appointment on 01/04/24. Daughter verbalized understanding and agreed to call with any concerns or questions.

## 2024-01-02 ENCOUNTER — Ambulatory Visit
Admission: RE | Admit: 2024-01-02 | Discharge: 2024-01-02 | Disposition: A | Source: Ambulatory Visit | Attending: Radiation Oncology

## 2024-01-02 ENCOUNTER — Other Ambulatory Visit: Payer: Self-pay

## 2024-01-02 LAB — RAD ONC ARIA SESSION SUMMARY
Course Elapsed Days: 25
Plan Fractions Treated to Date: 13
Plan Prescribed Dose Per Fraction: 2 Gy
Plan Total Fractions Prescribed: 33
Plan Total Prescribed Dose: 66 Gy
Reference Point Dosage Given to Date: 26 Gy
Reference Point Session Dosage Given: 2 Gy
Session Number: 13

## 2024-01-03 ENCOUNTER — Ambulatory Visit
Admission: RE | Admit: 2024-01-03 | Discharge: 2024-01-03 | Disposition: A | Source: Ambulatory Visit | Attending: Radiation Oncology

## 2024-01-03 ENCOUNTER — Other Ambulatory Visit: Payer: Self-pay

## 2024-01-03 ENCOUNTER — Ambulatory Visit
Admission: RE | Admit: 2024-01-03 | Discharge: 2024-01-03 | Disposition: A | Discharge status: Home / Self Care | Source: Ambulatory Visit | Attending: Radiation Oncology | Admitting: Radiation Oncology

## 2024-01-03 LAB — RAD ONC ARIA SESSION SUMMARY
Course Elapsed Days: 26
Plan Fractions Treated to Date: 14
Plan Prescribed Dose Per Fraction: 2 Gy
Plan Total Fractions Prescribed: 33
Plan Total Prescribed Dose: 66 Gy
Reference Point Dosage Given to Date: 28 Gy
Reference Point Session Dosage Given: 2 Gy
Session Number: 14

## 2024-01-04 ENCOUNTER — Other Ambulatory Visit: Payer: Self-pay

## 2024-01-04 ENCOUNTER — Inpatient Hospital Stay: Admitting: Internal Medicine

## 2024-01-04 ENCOUNTER — Inpatient Hospital Stay

## 2024-01-04 ENCOUNTER — Ambulatory Visit
Admission: RE | Admit: 2024-01-04 | Discharge: 2024-01-04 | Disposition: A | Discharge status: Home / Self Care | Source: Ambulatory Visit | Attending: Radiation Oncology | Admitting: Radiation Oncology

## 2024-01-04 VITALS — BP 99/61 | HR 100 | Temp 97.6°F | Resp 17 | Ht 65.0 in | Wt 192.0 lb

## 2024-01-04 DIAGNOSIS — C3491 Malignant neoplasm of unspecified part of right bronchus or lung: Secondary | ICD-10-CM

## 2024-01-04 LAB — CBC WITH DIFFERENTIAL (CANCER CENTER ONLY)
Abs Immature Granulocytes: 0.01 K/uL (ref 0.00–0.07)
Basophils Absolute: 0 K/uL (ref 0.0–0.1)
Basophils Relative: 0 %
Eosinophils Absolute: 0 K/uL (ref 0.0–0.5)
Eosinophils Relative: 1 %
HCT: 30.8 % — ABNORMAL LOW (ref 36.0–46.0)
Hemoglobin: 9.9 g/dL — ABNORMAL LOW (ref 12.0–15.0)
Immature Granulocytes: 0 %
Lymphocytes Relative: 14 %
Lymphs Abs: 0.4 K/uL — ABNORMAL LOW (ref 0.7–4.0)
MCH: 28.1 pg (ref 26.0–34.0)
MCHC: 32.1 g/dL (ref 30.0–36.0)
MCV: 87.5 fL (ref 80.0–100.0)
Monocytes Absolute: 0.3 K/uL (ref 0.1–1.0)
Monocytes Relative: 10 %
Neutro Abs: 2.3 K/uL (ref 1.7–7.7)
Neutrophils Relative %: 75 %
Platelet Count: 64 K/uL — ABNORMAL LOW (ref 150–400)
RBC: 3.52 MIL/uL — ABNORMAL LOW (ref 3.87–5.11)
RDW: 14.8 % (ref 11.5–15.5)
WBC Count: 3.1 K/uL — ABNORMAL LOW (ref 4.0–10.5)
nRBC: 0 % (ref 0.0–0.2)

## 2024-01-04 LAB — CMP (CANCER CENTER ONLY)
ALT: 10 U/L (ref 0–44)
AST: 16 U/L (ref 15–41)
Albumin: 3.9 g/dL (ref 3.5–5.0)
Alkaline Phosphatase: 65 U/L (ref 38–126)
Anion gap: 8 (ref 5–15)
BUN: 13 mg/dL (ref 6–20)
CO2: 27 mmol/L (ref 22–32)
Calcium: 9.5 mg/dL (ref 8.9–10.3)
Chloride: 104 mmol/L (ref 98–111)
Creatinine: 0.66 mg/dL (ref 0.44–1.00)
GFR, Estimated: >60 mL/min (ref >60)
Glucose, Bld: 114 mg/dL — ABNORMAL HIGH (ref 70–99)
Potassium: 3.8 mmol/L (ref 3.5–5.1)
Sodium: 138 mmol/L (ref 135–145)
Total Bilirubin: 0.5 mg/dL (ref 0.0–1.2)
Total Protein: 7.7 g/dL (ref 6.5–8.1)

## 2024-01-04 LAB — RAD ONC ARIA SESSION SUMMARY
Course Elapsed Days: 27
Plan Fractions Treated to Date: 15
Plan Prescribed Dose Per Fraction: 2 Gy
Plan Total Fractions Prescribed: 33
Plan Total Prescribed Dose: 66 Gy
Reference Point Dosage Given to Date: 30 Gy
Reference Point Session Dosage Given: 2 Gy
Session Number: 15

## 2024-01-04 NOTE — Progress Notes (Signed)
 [facility] [phone]   [phone]  OFFICE PROGRESS NOTE  [facility], [facility] [address] [place] [address]  DIAGNOSIS: stage IIIC (T3, N3, M0) non-small cell lung cancer, squamous cell carcinoma presented with cavitary right upper lobe lung mass in addition to mediastinal lymphadenopathy and there is concern also about possibility of bilateral supraclavicular lymph nodes diagnosed in September 2025  PRIOR THERAPY: None  CURRENT THERAPY: A course of concurrent chemoradiation with weekly carboplatin  for AUC of 2 and paclitaxel  45 MGs/M2 status post 2 cycles.  INTERVAL HISTORY: Andrea Mullen 58 y.o. female returns to the clinic today for follow-up visit accompanied by her daughter. Discussed the use of AI scribe software for clinical note transcription with the patient, who gave verbal consent to proceed.  History of Present Illness Andrea Mullen is a 58 year old female with stage III C squamous cell carcinoma who presents for follow-up during her concurrent chemoradiation treatment. She is accompanied by her daughter.  Diagnosed with stage III C squamous cell carcinoma in September 2025, she is undergoing concurrent chemoradiation therapy with weekly carboplatin  (AUC of 2) and paclitaxel  (45 mg/m2). She has completed two cycles of this treatment.  She has not experienced significant side effects from the chemotherapy and radiation, such as swallowing difficulties, nausea, vomiting, or diarrhea. However, she missed a chemotherapy session last week due to flu-like symptoms, which were confirmed not to be flu or COVID-19.  She reports recent right-sided chest pain associated with eating and swallowing difficulties.  Her platelet count is currently low at 68,000.  She has significant sleep disturbances, unable to sleep for four to five days. She has tried Tylenol  PM without success.     MEDICAL HISTORY: Past  Medical History:  Diagnosis Date   Anxiety    Asthma    Blind    left eye   Cancer (HCC)    lung, tx radiation and chemo   Hyperlipemia    Hypertension    Stroke (HCC)     ALLERGIES:  is allergic to bicillin c-r, penicillins, nsaids, and prednisone .  MEDICATIONS:  Current Outpatient Medications  Medication Sig Dispense Refill   albuterol  (PROVENTIL ) (2.5 MG/3ML) 0.083% nebulizer solution Take 3 mLs (2.5 mg total) by nebulization every 6 (six) hours as needed for wheezing or shortness of breath. (Patient not taking: Reported on 11/30/2023) 75 mL 12   albuterol  (VENTOLIN  HFA) 108 (90 Base) MCG/ACT inhaler Inhale 2 puffs into the lungs every 4 (four) hours as needed for wheezing or shortness of breath. 18 g 0   albuterol  (VENTOLIN  HFA) 108 (90 Base) MCG/ACT inhaler Inhale 2 puffs into the lungs every 6 (six) hours as needed for wheezing or shortness of breath. 8.5 g 2   apixaban  (ELIQUIS ) 5 MG TABS tablet Take 1 tablet (5 mg total) by mouth 2 (two) times daily.NO MORE REFILLS. MUST FOLLOW UP WITH PCP OR ONCOLOGIST. 60 tablet 0   cyclobenzaprine  (FLEXERIL ) 5 MG tablet Take 1 tablet (5 mg total) by mouth 3 (three) times daily as needed for muscle spasms. 30 tablet 0   HYDROcodone -acetaminophen  (NORCO/VICODIN) 5-325 MG tablet Take 1 tablet by mouth every 6 (six) hours as needed for moderate pain (pain score 4-6). 20 tablet 0   lidocaine -prilocaine  (EMLA ) cream Apply to affected area once 30 g 3   nicotine  (NICODERM CQ  - DOSED IN MG/24 HOURS) 14 mg/24hr patch Place 1 patch (14 mg total) onto the skin  daily. Apply 14mg  patch daily x 6 wk, then 7 mg patch daily x 2 wk 14 patch 2   nicotine  (NICODERM CQ  - DOSED IN MG/24 HR) 7 mg/24hr patch Place 1 patch (7 mg total) onto the skin daily. Apply 14mg  patch daily x 6 wk, then 7 mg patch daily x 2 wk 14 patch 0   Nystatin (GERHARDT'S BUTT CREAM) CREA Apply 1 Application topically 3 (three) times daily. Apply to affected area 60 each 3   ondansetron   (ZOFRAN ) 8 MG tablet Take 1 tablet (8 mg total) by mouth every 8 (eight) hours as needed for nausea or vomiting. Start on the third day after chemotherapy. 30 tablet 1   polyethylene glycol powder (GLYCOLAX /MIRALAX ) 17 GM/SCOOP powder Take 17 g by mouth 2 (two) times daily as needed for mild constipation or moderate constipation. Dissolve 1 capful (17g) in 4-8 ounces of liquid and take by mouth daily. (Patient not taking: Reported on 11/30/2023) 238 g 0   prochlorperazine  (COMPAZINE ) 10 MG tablet Take 1 tablet (10 mg total) by mouth every 6 (six) hours as needed. 30 tablet 2   rosuvastatin  (CRESTOR ) 40 MG tablet Take 1 tablet (40 mg total) by mouth daily. (Patient not taking: Reported on 11/30/2023) 30 tablet 3   senna-docusate (SENOKOT-S) 8.6-50 MG tablet Take 1 tablet by mouth 2 (two) times daily. 60 tablet 0   No current facility-administered medications for this visit.    SURGICAL HISTORY:  Past Surgical History:  Procedure Laterality Date   CARDIAC CATHETERIZATION N/A 11/01/2014   Procedure: Left Heart Cath and Coronary Angiography;  Surgeon: Victory LELON Sharps, MD;  Location: [facility] INVASIVE CV LAB;  Service: Cardiovascular;  Laterality: N/A;   CESAREAN SECTION     x3   HERNIA REPAIR     VIDEO BRONCHOSCOPY WITH ENDOBRONCHIAL ULTRASOUND Right 10/22/2023   Procedure: BRONCHOSCOPY, WITH EBUS;  Surgeon: Jude Harden GAILS, MD;  Location: [facility] ENDOSCOPY;  Service: Cardiopulmonary;  Laterality: Right;    REVIEW OF SYSTEMS:  Constitutional: positive for fatigue Eyes: negative Ears, nose, mouth, throat, and face: negative Respiratory: positive for cough and dyspnea on exertion Cardiovascular: negative Gastrointestinal: positive for odynophagia Genitourinary:negative Integument/breast: negative Hematologic/lymphatic: negative Musculoskeletal:negative Neurological: negative Behavioral/Psych: negative Endocrine: negative Allergic/Immunologic: negative   PHYSICAL EXAMINATION: General appearance: alert,  cooperative, fatigued, and no distress Head: Normocephalic, without obvious abnormality, atraumatic Neck: no adenopathy, no JVD, supple, symmetrical, trachea midline, and thyroid  not enlarged, symmetric, no tenderness/mass/nodules Lymph nodes: Cervical, supraclavicular, and axillary nodes normal. Resp: clear to auscultation bilaterally Back: symmetric, no curvature. ROM normal. No CVA tenderness. Cardio: regular rate and rhythm, S1, S2 normal, no murmur, click, rub or gallop GI: soft, non-tender; bowel sounds normal; no masses,  no organomegaly Extremities: extremities normal, atraumatic, no cyanosis or edema Neurologic: Alert and oriented X 3, normal strength and tone. Normal symmetric reflexes. Normal coordination and gait  ECOG PERFORMANCE STATUS: 1 - Symptomatic but completely ambulatory  Blood pressure 99/61, pulse 100, temperature 97.6 F (36.4 C), temperature source Temporal, resp. rate 17, height 5' 5 (1.651 m), weight 192 lb (87.1 kg), last menstrual period 11/16/2016, SpO2 100%.  LABORATORY DATA: Lab Results  Component Value Date   WBC 3.1 (L) 01/04/2024   HGB 9.9 (L) 01/04/2024   HCT 30.8 (L) 01/04/2024   MCV 87.5 01/04/2024   PLT 64 (L) 01/04/2024      Chemistry      Component Value Date/Time   NA 136 12/16/2023 0803   K 3.9 12/16/2023 0803  CL 102 12/16/2023 0803   CO2 28 12/16/2023 0803   BUN 9 12/16/2023 0803   CREATININE 0.61 12/16/2023 0803   CREATININE 0.72 04/03/2021 0000      Component Value Date/Time   CALCIUM  9.5 12/16/2023 0803   ALKPHOS 54 12/16/2023 0803   AST 16 12/16/2023 0803   ALT 10 12/16/2023 0803   BILITOT 0.8 12/16/2023 0803       RADIOGRAPHIC STUDIES: CT Lumbar Spine Wo Contrast Result Date: 12/07/2023 CLINICAL DATA:  Right history: Back trauma, no prior imaging (Age >= 16y) EXAM: CT LUMBAR SPINE WITHOUT CONTRAST TECHNIQUE: Multidetector CT imaging of the lumbar spine was performed without intravenous contrast administration.  Multiplanar CT image reconstructions were also generated. RADIATION DOSE REDUCTION: This exam was performed according to the departmental dose-optimization program which includes automated exposure control, adjustment of the mA and/or kV according to patient size and/or use of iterative reconstruction technique. COMPARISON:  None Available. FINDINGS: Segmentation: 5 lumbar type vertebrae. Alignment: Exaggerated lumbar lordosis. No traumatic subluxation. No listhesis. Vertebrae: No acute fracture. Vertebral body heights are normal. Posterior elements are intact. No evidence of focal bone lesion Paraspinal and other soft tissues: Trace right pleural effusion, unchanged or diminished from recent PET. Abdominal aortic atherosclerosis. Disc levels: Slight L3-L4 disc bulge. Mild multilevel facet hypertrophy. No high-grade canal stenosis. IMPRESSION: 1. No acute fracture or subluxation of the lumbar spine. 2. Trace right pleural effusion, unchanged or diminished from recent PET. Aortic Atherosclerosis (ICD10-I70.0). Electronically Signed   By: Andrea Gasman M.D.   On: 12/07/2023 18:00   CT Cervical Spine Wo Contrast Result Date: 12/07/2023 CLINICAL DATA:  Neck trauma, dangerous injury mechanism (Age 35-64y) EXAM: CT CERVICAL SPINE WITHOUT CONTRAST TECHNIQUE: Multidetector CT imaging of the cervical spine was performed without intravenous contrast. Multiplanar CT image reconstructions were also generated. RADIATION DOSE REDUCTION: This exam was performed according to the departmental dose-optimization program which includes automated exposure control, adjustment of the mA and/or kV according to patient size and/or use of iterative reconstruction technique. COMPARISON:  11/11/2023 FINDINGS: Alignment: Chronic straightening of normal lordosis. No traumatic subluxation. Skull base and vertebrae: No acute fracture. Vertebral body heights are maintained. The dens and skull base are intact. No evidence of focal bone  lesion. Soft tissues and spinal canal: No prevertebral fluid or swelling. No visible canal hematoma. Disc levels: The disc spaces are preserved. No spinal canal stenosis per Upper chest: No acute findings. Known right upper lobe mass is minimally included in the inferior field of view. Other: None. IMPRESSION: No acute fracture or traumatic subluxation of the cervical spine. Electronically Signed   By: Andrea Gasman M.D.   On: 12/07/2023 17:57   CT Head Wo Contrast Result Date: 12/07/2023 CLINICAL DATA:  Head trauma, moderate-severe EXAM: CT HEAD WITHOUT CONTRAST TECHNIQUE: Contiguous axial images were obtained from the base of the skull through the vertex without intravenous contrast. RADIATION DOSE REDUCTION: This exam was performed according to the departmental dose-optimization program which includes automated exposure control, adjustment of the mA and/or kV according to patient size and/or use of iterative reconstruction technique. COMPARISON:  Head CT 11/11/2023, MRI 10/19/2023 FINDINGS: Brain: No evidence of acute hemorrhage. Stable degree of atrophy and chronic small vessel ischemia. Remote lacunar infarcts in the basal ganglia. No evidence of acute ischemia. No subdural or extra-axial collection. No evidence of midline shift or mass effect. Vascular: Atherosclerosis of skullbase vasculature without hyperdense vessel or abnormal calcification. Skull: No fracture or focal lesion. Sinuses/Orbits: Opacification of the right greater  than left mastoid air cells, chronic, not significantly changed. No acute findings. Other: No confluent scalp hematoma. IMPRESSION: 1. No acute intracranial abnormality. No skull fracture. 2. Stable atrophy and chronic small vessel ischemia. Electronically Signed   By: Andrea Gasman M.D.   On: 12/07/2023 17:54   DG Pelvis Portable Result Date: 12/07/2023 EXAM: 1 or 2 VIEW(S) XRAY OF THE PELVIS 12/07/2023 05:31:00 PM COMPARISON: CT 10/25/2018. CLINICAL HISTORY: Fall.  Triage notes: Pt presents from home with family. Family reports pt fell out of bed unwitnessed. Hx of dementia. Pt is not sure how she got on the floor. NO LOC, unsure if she hit head. Complains of lower back pain. Family on scene making comments about having pt placed in facility. Alert and oriented x3. Is on blood thinners. FINDINGS: LIMITATIONS/ARTIFACTS: BONES AND JOINTS: Iliac crests are incompletely included. SI joints are non-widened. Pubic symphysis and rami are intact. No definitive fracture or malalignment. No focal osseous lesion. No joint dislocation. SOFT TISSUES: The soft tissues are unremarkable. IMPRESSION: 1. No acute fracture or malalignment identified. Cross-sectional imaging follow-up if persistent concern for hip or pelvic fracture 2. Incompletely included iliac crests. Electronically signed by: Luke Bun MD 12/07/2023 05:43 PM EDT RP Workstation: [id number]   DG Chest Portable 1 View Result Date: 12/07/2023 EXAM: 1 VIEW(S) XRAY OF THE CHEST 12/07/2023 05:31:00 PM COMPARISON: Chest x-ray 10/23/2020, PET/CT 11/30/2020. CLINICAL HISTORY: fall. Triage notes:Pt presents from home with family. Family reports pt fell out of bed unwitnessed. Hx of dementia. Pt is not sure how she got on the floor. NO LOC, unsure if she hit head. Complains of lower back pain. Family on scene making comments about ; having pt placed in facility. Alert and oriented x3. Is on blood thinners . ; Best images obtained as possible due to patient's lack of cooperation. FINDINGS: LUNGS AND PLEURA: Redemonstrated large right upper lobe cavitary mass with interval opacification compared to the prior chest radiograph. Streaky atelectasis at the bases. No pulmonary edema. No pleural effusion. No pneumothorax. HEART AND MEDIASTINUM: Stable cardiomegaly with aortic atherosclerosis. BONES AND SOFT TISSUES: No acute osseous abnormality. IMPRESSION: 1. Large right upper lobe cavitary mass with new internal opacification,  corresponding to history of known malignancy 2. Bibasilar atelectasis or scarring.  Small right effusion . 3. Cardiomegaly with aortic atherosclerosis. 4. No pneumothorax. Electronically signed by: Luke Bun MD 12/07/2023 05:41 PM EDT RP Workstation: [id number]    ASSESSMENT AND PLAN: This is a 58 years old African-American female with stage IIIC (T3, N3, M0) non-small cell lung cancer, squamous cell carcinoma presented with cavitary right upper lobe lung mass in addition to mediastinal lymphadenopathy and there is concern also about possibility of bilateral supraclavicular lymph nodes diagnosed in September 2025 She is currently undergoing course of concurrent chemoradiation with weekly carboplatin  and paclitaxel .  Status post 2 cycles. 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 Post two cycles. Missed last week's chemotherapy due to flu-like symptoms, which have resolved. Radiation therapy is ongoing without significant side effects. - Continue radiation therapy this week - Hold chemotherapy this week due to thrombocytopenia - Will resume chemotherapy next week if platelet count improves  Thrombocytopenia secondary to chemoradiation Platelet count is low at 68,000, likely secondary to chemoradiation. This level is insufficient for chemotherapy administration. - Held chemotherapy this week - Will reassess platelet count next week  Dysphagia secondary to radiation Experiencing dysphagia, likely  secondary to radiation therapy. No significant swallowing issues reported during the visit.  Insomnia Reports difficulty sleeping for the past four to five days. Try over-the-counter melatonin for sleep  Right-sided chest pain Reported right-sided chest pain, possibly related to swallowing issues from radiation. The patient was advised  to call immediately if she has any other concerning symptoms in the interval. The patient voices understanding of current disease status and treatment options and is in agreement with the current care plan.  All questions were answered. The patient knows to call the clinic with any problems, questions or concerns. We can certainly see the patient much sooner if necessary. The total time spent in the appointment was 30 minutes including review of chart and various tests results, discussions about plan of care and coordination of care plan .   Disclaimer: This note was dictated with voice recognition software. Similar sounding words can inadvertently be transcribed and may not be corrected upon review.

## 2024-01-05 ENCOUNTER — Telehealth: Payer: Self-pay

## 2024-01-05 ENCOUNTER — Other Ambulatory Visit: Payer: Self-pay

## 2024-01-05 ENCOUNTER — Ambulatory Visit
Admission: RE | Admit: 2024-01-05 | Discharge: 2024-01-05 | Disposition: A | Discharge status: Home / Self Care | Source: Ambulatory Visit | Attending: Radiation Oncology | Admitting: Radiation Oncology

## 2024-01-05 ENCOUNTER — Encounter: Payer: Self-pay | Admitting: Internal Medicine

## 2024-01-05 ENCOUNTER — Ambulatory Visit: Admitting: Emergency Medicine

## 2024-01-05 ENCOUNTER — Encounter: Payer: Self-pay | Admitting: Emergency Medicine

## 2024-01-05 ENCOUNTER — Other Ambulatory Visit: Payer: Self-pay | Admitting: Internal Medicine

## 2024-01-05 ENCOUNTER — Other Ambulatory Visit (HOSPITAL_BASED_OUTPATIENT_CLINIC_OR_DEPARTMENT_OTHER): Payer: Self-pay

## 2024-01-05 ENCOUNTER — Other Ambulatory Visit: Payer: Self-pay | Admitting: Physician Assistant

## 2024-01-05 LAB — RAD ONC ARIA SESSION SUMMARY
Course Elapsed Days: 28
Plan Fractions Treated to Date: 16
Plan Prescribed Dose Per Fraction: 2 Gy
Plan Total Fractions Prescribed: 33
Plan Total Prescribed Dose: 66 Gy
Reference Point Dosage Given to Date: 32 Gy
Reference Point Session Dosage Given: 2 Gy
Session Number: 16

## 2024-01-05 MED ORDER — APIXABAN 5 MG PO TABS
5.0000 mg | ORAL_TABLET | Freq: Two times a day (BID) | ORAL | 3 refills | Status: AC
  Filled 2024-01-05: qty 60, 30d supply, fill #0
  Filled 2024-03-15: qty 60, 30d supply, fill #1

## 2024-01-05 NOTE — Telephone Encounter (Signed)
 Spoke with patients daughter, [name] Hun in regards to refill on Eliquis .  Informed Dr. Sherrod and he sent refill to preferred pharmacy, [facility]. She voiced understanding.

## 2024-01-10 ENCOUNTER — Ambulatory Visit
Admission: RE | Admit: 2024-01-10 | Discharge: 2024-01-10 | Disposition: A | Source: Ambulatory Visit | Attending: Radiation Oncology

## 2024-01-10 ENCOUNTER — Inpatient Hospital Stay: Attending: Internal Medicine

## 2024-01-10 ENCOUNTER — Inpatient Hospital Stay

## 2024-01-10 ENCOUNTER — Inpatient Hospital Stay (HOSPITAL_BASED_OUTPATIENT_CLINIC_OR_DEPARTMENT_OTHER): Admitting: Internal Medicine

## 2024-01-10 ENCOUNTER — Other Ambulatory Visit: Payer: Self-pay

## 2024-01-10 ENCOUNTER — Inpatient Hospital Stay: Admitting: Internal Medicine

## 2024-01-10 VITALS — BP 142/80 | HR 102 | Temp 97.6°F | Resp 17 | Ht 65.0 in | Wt 198.0 lb

## 2024-01-10 DIAGNOSIS — D6959 Other secondary thrombocytopenia: Secondary | ICD-10-CM | POA: Diagnosis not present

## 2024-01-10 DIAGNOSIS — Z79899 Other long term (current) drug therapy: Secondary | ICD-10-CM | POA: Insufficient documentation

## 2024-01-10 DIAGNOSIS — G4700 Insomnia, unspecified: Secondary | ICD-10-CM | POA: Diagnosis not present

## 2024-01-10 DIAGNOSIS — Z5111 Encounter for antineoplastic chemotherapy: Secondary | ICD-10-CM | POA: Insufficient documentation

## 2024-01-10 DIAGNOSIS — C3491 Malignant neoplasm of unspecified part of right bronchus or lung: Secondary | ICD-10-CM

## 2024-01-10 DIAGNOSIS — C3411 Malignant neoplasm of upper lobe, right bronchus or lung: Secondary | ICD-10-CM | POA: Insufficient documentation

## 2024-01-10 DIAGNOSIS — R1310 Dysphagia, unspecified: Secondary | ICD-10-CM | POA: Diagnosis not present

## 2024-01-10 DIAGNOSIS — C3490 Malignant neoplasm of unspecified part of unspecified bronchus or lung: Secondary | ICD-10-CM | POA: Diagnosis present

## 2024-01-10 DIAGNOSIS — Z510 Encounter for antineoplastic radiation therapy: Secondary | ICD-10-CM | POA: Diagnosis present

## 2024-01-10 DIAGNOSIS — D61818 Other pancytopenia: Secondary | ICD-10-CM | POA: Insufficient documentation

## 2024-01-10 LAB — CBC WITH DIFFERENTIAL (CANCER CENTER ONLY)
Abs Immature Granulocytes: 0 K/uL (ref 0.00–0.07)
Basophils Absolute: 0 K/uL (ref 0.0–0.1)
Basophils Relative: 1 %
Eosinophils Absolute: 0 K/uL (ref 0.0–0.5)
Eosinophils Relative: 1 %
HCT: 31.8 % — ABNORMAL LOW (ref 36.0–46.0)
Hemoglobin: 10.4 g/dL — ABNORMAL LOW (ref 12.0–15.0)
Immature Granulocytes: 0 %
Lymphocytes Relative: 20 %
Lymphs Abs: 0.4 K/uL — ABNORMAL LOW (ref 0.7–4.0)
MCH: 28.7 pg (ref 26.0–34.0)
MCHC: 32.7 g/dL (ref 30.0–36.0)
MCV: 87.6 fL (ref 80.0–100.0)
Monocytes Absolute: 0.2 K/uL (ref 0.1–1.0)
Monocytes Relative: 9 %
Neutro Abs: 1.5 K/uL — ABNORMAL LOW (ref 1.7–7.7)
Neutrophils Relative %: 69 %
Platelet Count: 87 K/uL — ABNORMAL LOW (ref 150–400)
RBC: 3.63 MIL/uL — ABNORMAL LOW (ref 3.87–5.11)
RDW: 15.8 % — ABNORMAL HIGH (ref 11.5–15.5)
WBC Count: 2.1 K/uL — ABNORMAL LOW (ref 4.0–10.5)
nRBC: 0 % (ref 0.0–0.2)

## 2024-01-10 LAB — RAD ONC ARIA SESSION SUMMARY
Course Elapsed Days: 33
Plan Fractions Treated to Date: 17
Plan Prescribed Dose Per Fraction: 2 Gy
Plan Total Fractions Prescribed: 33
Plan Total Prescribed Dose: 66 Gy
Reference Point Dosage Given to Date: 34 Gy
Reference Point Session Dosage Given: 2 Gy
Session Number: 17

## 2024-01-10 NOTE — Progress Notes (Signed)
 [facility] [phone]   [phone]  OFFICE PROGRESS NOTE  [facility], [facility] [address] [place] [address]  DIAGNOSIS: stage IIIC (T3, N3, M0) non-small cell lung cancer, squamous cell carcinoma presented with cavitary right upper lobe lung mass in addition to mediastinal lymphadenopathy and there is concern also about possibility of bilateral supraclavicular lymph nodes diagnosed in September 2025  PRIOR THERAPY: None  CURRENT THERAPY: A course of concurrent chemoradiation with weekly carboplatin  for AUC of 2 and paclitaxel  45 MGs/M2 status post 2 cycles.  INTERVAL HISTORY: Andrea Mullen 58 y.o. female returns to the clinic today for follow-up visit accompanied by her daughter.Discussed the use of AI scribe software for clinical note transcription with the patient, who gave verbal consent to proceed.  History of Present Illness Andrea Mullen is a 59 year old female with stage 3C non-small cell lung cancer who presents for evaluation before starting cycle number three of chemotherapy.  She was diagnosed with stage 3C non-small cell lung cancer in September 2025 and is currently undergoing concurrent chemoradiation therapy with weekly carboplatin  and paclitaxel . She has completed two cycles of chemotherapy and is here for evaluation before starting the third cycle.  She experiences persistent and bothersome upper back pain, which she associates with the areas where she received radiation.  She has occasional shortness of breath and a persistent productive cough with phlegm that varies in color, sometimes appearing yellow, green, or clear.  No nausea, vomiting, or diarrhea. Her platelet count has improved to 87.    MEDICAL HISTORY: Past Medical History:  Diagnosis Date   Anxiety    Asthma    Blind    left eye   Cancer (HCC)    lung, tx radiation and chemo   Hyperlipemia    Hypertension    Stroke  (HCC)     ALLERGIES:  is allergic to bicillin c-r, penicillins, nsaids, and prednisone .  MEDICATIONS:  Current Outpatient Medications  Medication Sig Dispense Refill   albuterol  (PROVENTIL ) (2.5 MG/3ML) 0.083% nebulizer solution Take 3 mLs (2.5 mg total) by nebulization every 6 (six) hours as needed for wheezing or shortness of breath. (Patient not taking: Reported on 11/30/2023) 75 mL 12   albuterol  (VENTOLIN  HFA) 108 (90 Base) MCG/ACT inhaler Inhale 2 puffs into the lungs every 4 (four) hours as needed for wheezing or shortness of breath. 18 g 0   albuterol  (VENTOLIN  HFA) 108 (90 Base) MCG/ACT inhaler Inhale 2 puffs into the lungs every 6 (six) hours as needed for wheezing or shortness of breath. 8.5 g 2   apixaban  (ELIQUIS ) 5 MG TABS tablet Take 1 tablet (5 mg total) by mouth 2 (two) times daily. 60 tablet 3   cyclobenzaprine  (FLEXERIL ) 5 MG tablet Take 1 tablet (5 mg total) by mouth 3 (three) times daily as needed for muscle spasms. 30 tablet 0   HYDROcodone -acetaminophen  (NORCO/VICODIN) 5-325 MG tablet Take 1 tablet by mouth every 6 (six) hours as needed for moderate pain (pain score 4-6). 20 tablet 0   lidocaine -prilocaine  (EMLA ) cream Apply to affected area once 30 g 3   nicotine  (NICODERM CQ  - DOSED IN MG/24 HOURS) 14 mg/24hr patch Place 1 patch (14 mg total) onto the skin daily. Apply 14mg  patch daily x 6 wk, then 7 mg patch daily x 2 wk 14 patch 2   nicotine  (NICODERM CQ  - DOSED IN MG/24 HR) 7 mg/24hr patch Place 1 patch (7 mg  total) onto the skin daily. Apply 14mg  patch daily x 6 wk, then 7 mg patch daily x 2 wk 14 patch 0   Nystatin (GERHARDT'S BUTT CREAM) CREA Apply 1 Application topically 3 (three) times daily. Apply to affected area 60 each 3   ondansetron  (ZOFRAN ) 8 MG tablet Take 1 tablet (8 mg total) by mouth every 8 (eight) hours as needed for nausea or vomiting. Start on the third day after chemotherapy. 30 tablet 1   polyethylene glycol powder (GLYCOLAX /MIRALAX ) 17 GM/SCOOP  powder Take 17 g by mouth 2 (two) times daily as needed for mild constipation or moderate constipation. Dissolve 1 capful (17g) in 4-8 ounces of liquid and take by mouth daily. (Patient not taking: Reported on 11/30/2023) 238 g 0   prochlorperazine  (COMPAZINE ) 10 MG tablet Take 1 tablet (10 mg total) by mouth every 6 (six) hours as needed. 30 tablet 2   rosuvastatin  (CRESTOR ) 40 MG tablet Take 1 tablet (40 mg total) by mouth daily. (Patient not taking: Reported on 11/30/2023) 30 tablet 3   senna-docusate (SENOKOT-S) 8.6-50 MG tablet Take 1 tablet by mouth 2 (two) times daily. 60 tablet 0   No current facility-administered medications for this visit.    SURGICAL HISTORY:  Past Surgical History:  Procedure Laterality Date   CARDIAC CATHETERIZATION N/A 11/01/2014   Procedure: Left Heart Cath and Coronary Angiography;  Surgeon: Victory LELON Sharps, MD;  Location: [facility] INVASIVE CV LAB;  Service: Cardiovascular;  Laterality: N/A;   CESAREAN SECTION     x3   HERNIA REPAIR     VIDEO BRONCHOSCOPY WITH ENDOBRONCHIAL ULTRASOUND Right 10/22/2023   Procedure: BRONCHOSCOPY, WITH EBUS;  Surgeon: Jude Harden GAILS, MD;  Location: [facility] ENDOSCOPY;  Service: Cardiopulmonary;  Laterality: Right;    REVIEW OF SYSTEMS:  A comprehensive review of systems was negative except for: Constitutional: positive for fatigue Musculoskeletal: positive for back pain   PHYSICAL EXAMINATION: General appearance: alert, cooperative, fatigued, and no distress Head: Normocephalic, without obvious abnormality, atraumatic Neck: no adenopathy, no JVD, supple, symmetrical, trachea midline, and thyroid  not enlarged, symmetric, no tenderness/mass/nodules Lymph nodes: Cervical, supraclavicular, and axillary nodes normal. Resp: clear to auscultation bilaterally Back: symmetric, no curvature. ROM normal. No CVA tenderness. Cardio: regular rate and rhythm, S1, S2 normal, no murmur, click, rub or gallop GI: soft, non-tender; bowel sounds normal; no  masses,  no organomegaly Extremities: extremities normal, atraumatic, no cyanosis or edema  ECOG PERFORMANCE STATUS: 1 - Symptomatic but completely ambulatory  Blood pressure (!) 142/80, pulse (!) 102, temperature 97.6 F (36.4 C), temperature source Temporal, resp. rate 17, height 5' 5 (1.651 m), weight 198 lb (89.8 kg), last menstrual period 11/16/2016, SpO2 97%.  LABORATORY DATA: Lab Results  Component Value Date   WBC 3.1 (L) 01/04/2024   HGB 9.9 (L) 01/04/2024   HCT 30.8 (L) 01/04/2024   MCV 87.5 01/04/2024   PLT 64 (L) 01/04/2024      Chemistry      Component Value Date/Time   NA 138 01/04/2024 1316   K 3.8 01/04/2024 1316   CL 104 01/04/2024 1316   CO2 27 01/04/2024 1316   BUN 13 01/04/2024 1316   CREATININE 0.66 01/04/2024 1316   CREATININE 0.72 04/03/2021 0000      Component Value Date/Time   CALCIUM  9.5 01/04/2024 1316   ALKPHOS 65 01/04/2024 1316   AST 16 01/04/2024 1316   ALT 10 01/04/2024 1316   BILITOT 0.5 01/04/2024 1316       RADIOGRAPHIC STUDIES: No  results found.   ASSESSMENT AND PLAN: This is a 58 years old African-American female with stage IIIC (T3, N3, M0) non-small cell lung cancer, squamous cell carcinoma presented with cavitary right upper lobe lung mass in addition to mediastinal lymphadenopathy and there is concern also about possibility of bilateral supraclavicular lymph nodes diagnosed in September 2025 She is currently undergoing course of concurrent chemoradiation with weekly carboplatin  and paclitaxel .  Status post 2 cycles. The patient has been tolerating her treatment well except for the pancytopenia. Assessment and Plan Assessment & Plan Stage 3C non-small cell lung cancer on concurrent chemoradiation Stage 3C non-small cell lung cancer diagnosed in September 2025. Currently undergoing concurrent chemoradiation with weekly carboplatin  and paclitaxel . Post two cycles of chemotherapy. Upper back pain likely due to radiation-induced  muscle problems. Platelet count improved to 87, acceptable for treatment continuation. Treatment may cause further decrease in blood counts, necessitating monitoring. - Continue concurrent chemoradiation with weekly carboplatin  and paclitaxel . - Administered chemotherapy tomorrow. - Monitor blood counts next week to determine if further chemotherapy is needed.  Thrombocytopenia secondary to chemotherapy Thrombocytopenia secondary to chemotherapy. Platelet count improved to 87, acceptable for treatment continuation. Anticipate potential decrease in blood counts with ongoing treatment.  We will treat her tomorrow despite the low white blood count as well as low platelets. - Continue to monitor platelet count weekly to assess treatment continuation.  Cough with phlegm Persistent cough with phlegm, varying in color from yellow, green, to clear. No specific cause identified during this visit.  Shortness of breath Intermittent shortness of breath reported. No specific cause identified during this visit. She was advised to call immediately if she has any concerning symptoms in the interval.  The patient voices understanding of current disease status and treatment options and is in agreement with the current care plan.  All questions were answered. The patient knows to call the clinic with any problems, questions or concerns. We can certainly see the patient much sooner if necessary. The total time spent in the appointment was 30 minutes including review of chart and various tests results, discussions about plan of care and coordination of care plan .   Disclaimer: This note was dictated with voice recognition software. Similar sounding words can inadvertently be transcribed and may not be corrected upon review.

## 2024-01-11 ENCOUNTER — Inpatient Hospital Stay

## 2024-01-11 ENCOUNTER — Ambulatory Visit
Admission: RE | Admit: 2024-01-11 | Discharge: 2024-01-11 | Disposition: A | Source: Ambulatory Visit | Attending: Radiation Oncology

## 2024-01-11 ENCOUNTER — Encounter: Payer: Self-pay | Admitting: Internal Medicine

## 2024-01-11 ENCOUNTER — Other Ambulatory Visit: Payer: Self-pay

## 2024-01-11 VITALS — BP 114/63 | HR 92 | Temp 98.0°F | Resp 19

## 2024-01-11 DIAGNOSIS — C3491 Malignant neoplasm of unspecified part of right bronchus or lung: Secondary | ICD-10-CM

## 2024-01-11 DIAGNOSIS — Z510 Encounter for antineoplastic radiation therapy: Secondary | ICD-10-CM | POA: Diagnosis not present

## 2024-01-11 LAB — RAD ONC ARIA SESSION SUMMARY
Course Elapsed Days: 34
Plan Fractions Treated to Date: 18
Plan Prescribed Dose Per Fraction: 2 Gy
Plan Total Fractions Prescribed: 33
Plan Total Prescribed Dose: 66 Gy
Reference Point Dosage Given to Date: 36 Gy
Reference Point Session Dosage Given: 2 Gy
Session Number: 18

## 2024-01-11 LAB — CMP (CANCER CENTER ONLY)
ALT: 7 U/L (ref 0–44)
AST: 16 U/L (ref 15–41)
Albumin: 3.8 g/dL (ref 3.5–5.0)
Alkaline Phosphatase: 68 U/L (ref 38–126)
Anion gap: 8 (ref 5–15)
BUN: 11 mg/dL (ref 6–20)
CO2: 27 mmol/L (ref 22–32)
Calcium: 9.8 mg/dL (ref 8.9–10.3)
Chloride: 104 mmol/L (ref 98–111)
Creatinine: 0.64 mg/dL (ref 0.44–1.00)
Glucose, Bld: 92 mg/dL (ref 70–99)
Potassium: 4.2 mmol/L (ref 3.5–5.1)
Sodium: 139 mmol/L (ref 135–145)
Total Bilirubin: 0.4 mg/dL (ref 0.0–1.2)
Total Protein: 7.8 g/dL (ref 6.5–8.1)

## 2024-01-11 MED ORDER — FAMOTIDINE IN NACL 20-0.9 MG/50ML-% IV SOLN
20.0000 mg | Freq: Once | INTRAVENOUS | Status: AC
  Administered 2024-01-11: 20 mg via INTRAVENOUS
  Filled 2024-01-11: qty 50

## 2024-01-11 MED ORDER — SODIUM CHLORIDE 0.9 % IV SOLN: INTRAVENOUS | Status: DC

## 2024-01-11 MED ORDER — SODIUM CHLORIDE 0.9 % IV SOLN
246.8000 mg | Freq: Once | INTRAVENOUS | Status: AC
  Administered 2024-01-11: 250 mg via INTRAVENOUS
  Filled 2024-01-11: qty 25

## 2024-01-11 MED ORDER — PALONOSETRON HCL INJECTION 0.25 MG/5ML
0.2500 mg | Freq: Once | INTRAVENOUS | Status: AC
  Administered 2024-01-11: 0.25 mg via INTRAVENOUS
  Filled 2024-01-11: qty 5

## 2024-01-11 MED ORDER — DIPHENHYDRAMINE HCL 50 MG/ML IJ SOLN
50.0000 mg | Freq: Once | INTRAMUSCULAR | Status: AC
  Administered 2024-01-11: 50 mg via INTRAVENOUS
  Filled 2024-01-11: qty 1

## 2024-01-11 MED ORDER — SODIUM CHLORIDE 0.9 % IV SOLN
45.0000 mg/m2 | Freq: Once | INTRAVENOUS | Status: AC
  Administered 2024-01-11: 84 mg via INTRAVENOUS
  Filled 2024-01-11: qty 14

## 2024-01-11 NOTE — Patient Instructions (Signed)
 [facility] - A DEPT OF [facility]  Discharge Instructions: Thank you for choosing [facility] to provide your oncology and hematology care.   If you have a lab appointment with the Cancer Center, please go directly to the Cancer Center and check in at the registration area.   Wear comfortable clothing and clothing appropriate for easy access to any Portacath or PICC line.   We strive to give you quality time with your provider. You may need to reschedule your appointment if you arrive late (15 or more minutes).  Arriving late affects you and other patients whose appointments are after yours.  Also, if you miss three or more appointments without notifying the office, you may be dismissed from the clinic at the provider's discretion.      For prescription refill requests, have your pharmacy contact our office and allow 72 hours for refills to be completed.    Today you received the following chemotherapy and/or immunotherapy agents: Paclitaxel and Carboplatin      To help prevent nausea and vomiting after your treatment, we encourage you to take your nausea medication as directed.  BELOW ARE SYMPTOMS THAT SHOULD BE REPORTED IMMEDIATELY: *FEVER GREATER THAN 100.4 F (38 C) OR HIGHER *CHILLS OR SWEATING *NAUSEA AND VOMITING THAT IS NOT CONTROLLED WITH YOUR NAUSEA MEDICATION *UNUSUAL SHORTNESS OF BREATH *UNUSUAL BRUISING OR BLEEDING *URINARY PROBLEMS (pain or burning when urinating, or frequent urination) *BOWEL PROBLEMS (unusual diarrhea, constipation, pain near the anus) TENDERNESS IN MOUTH AND THROAT WITH OR WITHOUT PRESENCE OF ULCERS (sore throat, sores in mouth, or a toothache) UNUSUAL RASH, SWELLING OR PAIN  UNUSUAL VAGINAL DISCHARGE OR ITCHING   Items with * indicate a potential emergency and should be followed up as soon as possible or go to the Emergency Department if any problems should occur.  Please show the CHEMOTHERAPY ALERT CARD  or IMMUNOTHERAPY ALERT CARD at check-in to the Emergency Department and triage nurse.  Should you have questions after your visit or need to cancel or reschedule your appointment, please contact [facility] - A DEPT OF [facility]  Dept: [phone]  and follow the prompts.  Office hours are 8:00 a.m. to 4:30 p.m. Monday - Friday. Please note that voicemails left after 4:00 p.m. may not be returned until the following business day.  We are closed weekends and major holidays. You have access to a nurse at all times for urgent questions. Please call the main number to the clinic Dept: [phone] and follow the prompts.   For any non-urgent questions, you may also contact your provider using MyChart. We now offer e-Visits for anyone 54 and older to request care online for non-urgent symptoms. For details visit [email or web address].   Also download the MyChart app! Go to the app store, search "MyChart", open the app, select [place], and log in with your MyChart username and password.

## 2024-01-12 ENCOUNTER — Other Ambulatory Visit: Payer: Self-pay

## 2024-01-12 ENCOUNTER — Ambulatory Visit
Admission: RE | Admit: 2024-01-12 | Discharge: 2024-01-12 | Disposition: A | Source: Ambulatory Visit | Attending: Radiation Oncology

## 2024-01-12 DIAGNOSIS — Z510 Encounter for antineoplastic radiation therapy: Secondary | ICD-10-CM | POA: Diagnosis not present

## 2024-01-12 LAB — RAD ONC ARIA SESSION SUMMARY
Course Elapsed Days: 35
Plan Fractions Treated to Date: 19
Plan Prescribed Dose Per Fraction: 2 Gy
Plan Total Fractions Prescribed: 33
Plan Total Prescribed Dose: 66 Gy
Reference Point Dosage Given to Date: 38 Gy
Reference Point Session Dosage Given: 2 Gy
Session Number: 19

## 2024-01-13 ENCOUNTER — Ambulatory Visit

## 2024-01-14 ENCOUNTER — Telehealth: Payer: Self-pay | Admitting: Radiation Oncology

## 2024-01-14 ENCOUNTER — Ambulatory Visit

## 2024-01-14 NOTE — Telephone Encounter (Signed)
 12/5 Received call from patient's daughter to cancel patient treatment appt on today due to being very sick.  Email sent to L3 machine and copied nursing/Support RTT, so they are aware.

## 2024-01-17 ENCOUNTER — Ambulatory Visit
Admission: RE | Admit: 2024-01-17 | Discharge: 2024-01-17 | Disposition: A | Source: Ambulatory Visit | Attending: Radiation Oncology

## 2024-01-17 ENCOUNTER — Ambulatory Visit: Admission: RE | Admit: 2024-01-17 | Discharge: 2024-01-17 | Attending: Radiation Oncology

## 2024-01-17 ENCOUNTER — Inpatient Hospital Stay: Admitting: Licensed Clinical Social Worker

## 2024-01-17 ENCOUNTER — Other Ambulatory Visit: Payer: Self-pay

## 2024-01-17 DIAGNOSIS — Z510 Encounter for antineoplastic radiation therapy: Secondary | ICD-10-CM | POA: Diagnosis not present

## 2024-01-17 LAB — RAD ONC ARIA SESSION SUMMARY
Course Elapsed Days: 40
Plan Fractions Treated to Date: 20
Plan Prescribed Dose Per Fraction: 2 Gy
Plan Total Fractions Prescribed: 33
Plan Total Prescribed Dose: 66 Gy
Reference Point Dosage Given to Date: 40 Gy
Reference Point Session Dosage Given: 2 Gy
Session Number: 20

## 2024-01-17 NOTE — Progress Notes (Unsigned)
 Patient Care Team: [place], [facility] as PCP - General Shelah, Lamar RAMAN, MD as Consulting Physician (Pulmonary Disease) Prentis Duwaine BROCKS, RN as Oncology Nurse Navigator  Clinic Day:  01/18/2024  Referring physician: Steva Meeter [facility]*  ASSESSMENT & PLAN:   Assessment & Plan: Non-small cell lung cancer (HCC) Stage 3C non-small cell lung cancer diagnosed in September 2025. Currently undergoing concurrent chemoradiation with weekly carboplatin  and paclitaxel . Post two cycles of chemotherapy. Upper back pain likely due to radiation-induced muscle problems. Platelet count improved to 125, acceptable for treatment continuation, however, she has significant neutropenia today with WBC 1.7 and ANC 1.1. hold cycle 4 chemotherapy with carboplatin  and paclitaxel .  -Treatment may cause further decrease in blood counts, necessitating monitoring. - proceed with radiation as scheduled.  - Monitor blood counts next week to determine appropriateness of additional chemotherapy.      Neutropenia WBC 1.7 with ANC 1.1 today. Consulted with Cassie, PA. We agree that it is best to hold chemotherapy carboplatin  and paclitaxel  today. Proceed with radiation as scheduled. Will continue with labs and infusion next week as scheduled. Discussed infection protection strategies with the patient. These include good hand washing, avoiding sick contacts, avoiding large crowds of people, and wearing a mask in public when gatherings cannot be avoided. She and her daughter both voiced understanding and agreement.   Thrombocytopenia Platelet count continues to improve with value of 125 today. Continue to monitor values prior to every visit for chemotherapy.   Anemia Mild and stable anemia. Hgb is 9.3 and Hct 28.0. values are within treatment parameters however, she is significantly neutropenic, so chemotherapy will be held today. Recheck levels next week prior to chemotherapy and determine appropriateness for  additional treatment.   Plan Labs reviewed.  -significant neutropenia.  --hold chemotherapy today. -CMP unremarkable.  Proceed with radiation as scheduled.  Plan for labs, follow up, and treatments as scheduled.   The patient understands the plans discussed today and is in agreement with them.  She knows to contact our office if she develops concerns prior to her next appointment.  I provided 15 minutes of face-to-face time during this encounter and > 50% was spent counseling as documented under my assessment and plan.    Powell FORBES Lessen, NP  [facility] - A DEPT OF [facility] [address] [place] [address] Dept: [phone] Dept Fax: [phone]   No orders of the defined types were placed in this encounter.     CHIEF COMPLAINT:  CC: NSCLC  Current Treatment:  concurrent chemoradiation with weekly carboplatin  for AUC of 2 and paclitaxel  45 MGs/M2 status post 3 cycles   INTERVAL HISTORY:  Andrea Mullen is here today for repeat clinical assessment.  He last saw Dr. Gatha on 01/10/2024.  Today, he presents for cycle 4 day 1 concurrent chemoradiation with weekly carboplatin  for AUC of 2 and paclitaxel  45 mg/m. She reports moderate fatigue. States this was a problem prior to undergoing chemoradiation. She continues to have productive cough. She states that she has started to develop some numbness in her lower legs. This has been going on for the past few weeks. She denies chest pain, chest pressure, or shortness of breath. She denies headaches or visual disturbances. He denies abdominal pain, nausea, vomiting, or changes in bowel or bladder habits. She denies fevers or chills. She denies pain. Her appetite is good. Her weight has increased 7 pounds over last week.  I have reviewed the past medical history, past surgical history, social history and family history with the patient and they are unchanged from previous  note.  ALLERGIES:  is allergic to bicillin c-r, penicillins, nsaids, and prednisone .  MEDICATIONS:  Current Outpatient Medications  Medication Sig Dispense Refill   albuterol  (PROVENTIL ) (2.5 MG/3ML) 0.083% nebulizer solution Take 3 mLs (2.5 mg total) by nebulization every 6 (six) hours as needed for wheezing or shortness of breath. (Patient not taking: Reported on 11/30/2023) 75 mL 12   albuterol  (VENTOLIN  HFA) 108 (90 Base) MCG/ACT inhaler Inhale 2 puffs into the lungs every 4 (four) hours as needed for wheezing or shortness of breath. 18 g 0   albuterol  (VENTOLIN  HFA) 108 (90 Base) MCG/ACT inhaler Inhale 2 puffs into the lungs every 6 (six) hours as needed for wheezing or shortness of breath. 8.5 g 2   apixaban  (ELIQUIS ) 5 MG TABS tablet Take 1 tablet (5 mg total) by mouth 2 (two) times daily. 60 tablet 3   cyclobenzaprine  (FLEXERIL ) 5 MG tablet Take 1 tablet (5 mg total) by mouth 3 (three) times daily as needed for muscle spasms. 30 tablet 0   HYDROcodone -acetaminophen  (NORCO/VICODIN) 5-325 MG tablet Take 1 tablet by mouth every 6 (six) hours as needed for moderate pain (pain score 4-6). 20 tablet 0   lidocaine -prilocaine  (EMLA ) cream Apply to affected area once 30 g 3   nicotine  (NICODERM CQ  - DOSED IN MG/24 HOURS) 14 mg/24hr patch Place 1 patch (14 mg total) onto the skin daily. Apply 14mg  patch daily x 6 wk, then 7 mg patch daily x 2 wk 14 patch 2   nicotine  (NICODERM CQ  - DOSED IN MG/24 HR) 7 mg/24hr patch Place 1 patch (7 mg total) onto the skin daily. Apply 14mg  patch daily x 6 wk, then 7 mg patch daily x 2 wk 14 patch 0   Nystatin (GERHARDT'S BUTT CREAM) CREA Apply 1 Application topically 3 (three) times daily. Apply to affected area 60 each 3   ondansetron  (ZOFRAN ) 8 MG tablet Take 1 tablet (8 mg total) by mouth every 8 (eight) hours as needed for nausea or vomiting. Start on the third day after chemotherapy. 30 tablet 1   polyethylene glycol powder (GLYCOLAX /MIRALAX ) 17 GM/SCOOP powder  Take 17 g by mouth 2 (two) times daily as needed for mild constipation or moderate constipation. Dissolve 1 capful (17g) in 4-8 ounces of liquid and take by mouth daily. (Patient not taking: Reported on 11/30/2023) 238 g 0   prochlorperazine  (COMPAZINE ) 10 MG tablet Take 1 tablet (10 mg total) by mouth every 6 (six) hours as needed. 30 tablet 2   rosuvastatin  (CRESTOR ) 40 MG tablet Take 1 tablet (40 mg total) by mouth daily. (Patient not taking: Reported on 11/30/2023) 30 tablet 3   senna-docusate (SENOKOT-S) 8.6-50 MG tablet Take 1 tablet by mouth 2 (two) times daily. 60 tablet 0   No current facility-administered medications for this visit.    HISTORY OF PRESENT ILLNESS:   Oncology History  Non-small cell lung cancer (HCC)  11/03/2023 Initial Diagnosis   Non-small cell lung cancer (HCC)   11/03/2023 Cancer Staging   Staging form: Lung, AJCC V9 - Clinical: Stage IIIB (cT3, cN2b, cM0) - Signed by Sherrod Sherrod, MD on 11/03/2023 Method of lymph node assessment: Clinical   12/09/2023 -  Chemotherapy   Patient is on Treatment Plan : LUNG Carboplatin  + Paclitaxel  + XRT q7d         REVIEW OF SYSTEMS:  Constitutional: Denies fevers, chills or abnormal weight loss Eyes: Denies blurriness of vision Ears, nose, mouth, throat, and face: Denies mucositis or sore throat Respiratory: persistent congested cough which is productive. No SOB or wheezing reported . Cardiovascular: Denies palpitation, chest discomfort or lower extremity swelling Gastrointestinal:  Denies nausea, heartburn or change in bowel habits Skin: Denies abnormal skin rashes Lymphatics: Denies new lymphadenopathy or easy bruising Neurological:Denies tingling or new weaknesses. States that she is starting to develop new numbness in both of her lower legs.  Behavioral/Psych: Mood is stable, no new changes  All other systems were reviewed with the patient and are negative.   VITALS:   Today's Vitals   01/18/24 1302  BP:  130/76  Pulse: (!) 103  Resp: 17  Temp: 97.8 F (36.6 C)  SpO2: 99%  Weight: 205 lb 12.8 oz (93.4 kg)   Body mass index is 34.25 kg/m.   Wt Readings from Last 3 Encounters:  01/18/24 205 lb 12.8 oz (93.4 kg)  01/10/24 198 lb (89.8 kg)  01/04/24 192 lb (87.1 kg)    Body mass index is 34.25 kg/m.  Performance status (ECOG): 2 - Symptomatic, <50% confined to bed  PHYSICAL EXAM:   GENERAL:alert, no distress and comfortable SKIN: skin color, texture, turgor are normal, no rashes or significant lesions EYES: normal, Conjunctiva are pink and non-injected, sclera clear OROPHARYNX:no exudate, no erythema and lips, buccal mucosa, and tongue normal  NECK: supple, thyroid  normal size, non-tender, without nodularity LYMPH:  no palpable lymphadenopathy in the cervical, axillary or inguinal LUNGS: clear to auscultation and percussion with normal breathing effort. Deep, congested cough noted during today's visit . HEART: regular rate & rhythm and no murmurs and no lower extremity edema ABDOMEN:abdomen soft, non-tender and normal bowel sounds Musculoskeletal:no cyanosis of digits and no clubbing  NEURO: alert & oriented x 3 with fluent speech, no focal motor/sensory deficits  LABORATORY DATA:  I have reviewed the data as listed    Component Value Date/Time   NA 139 01/18/2024 1238   K 4.5 01/18/2024 1238   CL 103 01/18/2024 1238   CO2 27 01/18/2024 1238   GLUCOSE 90 01/18/2024 1238   BUN 9 01/18/2024 1238   CREATININE 0.54 01/18/2024 1238   CREATININE 0.72 04/03/2021 0000   CALCIUM  9.7 01/18/2024 1238   PROT 7.7 01/18/2024 1238   ALBUMIN 4.0 01/18/2024 1238   AST 27 01/18/2024 1238   ALT 22 01/18/2024 1238   ALKPHOS 65 01/18/2024 1238   BILITOT 0.6 01/18/2024 1238   GFRNONAA >60 01/18/2024 1238   GFRAA >60 08/04/2018 1304    Lab Results  Component Value Date   WBC 1.7 (L) 01/18/2024   NEUTROABS 1.1 (L) 01/18/2024   HGB 9.3 (L) 01/18/2024   HCT 28.0 (L) 01/18/2024   MCV  87.5 01/18/2024   PLT 125 (L) 01/18/2024

## 2024-01-17 NOTE — Telephone Encounter (Signed)
 Tried contacting pt to advise that  Dr. Arleta is not taking pt with medicaid insurance at the moment. Please advise pt to resched w a different provider

## 2024-01-17 NOTE — Assessment & Plan Note (Signed)
 Stage 3C non-small cell lung cancer diagnosed in September 2025. Currently undergoing concurrent chemoradiation with weekly carboplatin  and paclitaxel . Post two cycles of chemotherapy. Upper back pain likely due to radiation-induced muscle problems. Platelet count improved to 87, acceptable for treatment continuation. Treatment may cause further decrease in blood counts, necessitating monitoring. - Continue concurrent chemoradiation with weekly carboplatin  and paclitaxel . - Administered chemotherapy tomorrow. - Monitor blood counts next week to determine if further chemotherapy is needed.

## 2024-01-18 ENCOUNTER — Encounter: Payer: Self-pay | Admitting: Nurse Practitioner

## 2024-01-18 ENCOUNTER — Inpatient Hospital Stay

## 2024-01-18 ENCOUNTER — Ambulatory Visit
Admission: RE | Admit: 2024-01-18 | Discharge: 2024-01-18 | Disposition: A | Source: Ambulatory Visit | Attending: Radiation Oncology

## 2024-01-18 ENCOUNTER — Other Ambulatory Visit: Payer: Self-pay

## 2024-01-18 ENCOUNTER — Inpatient Hospital Stay: Admitting: Nurse Practitioner

## 2024-01-18 ENCOUNTER — Encounter: Payer: Self-pay | Admitting: Internal Medicine

## 2024-01-18 VITALS — BP 130/76 | HR 103 | Temp 97.8°F | Resp 17 | Wt 205.8 lb

## 2024-01-18 DIAGNOSIS — C3491 Malignant neoplasm of unspecified part of right bronchus or lung: Secondary | ICD-10-CM

## 2024-01-18 DIAGNOSIS — Z510 Encounter for antineoplastic radiation therapy: Secondary | ICD-10-CM | POA: Diagnosis not present

## 2024-01-18 LAB — RAD ONC ARIA SESSION SUMMARY
Course Elapsed Days: 41
Plan Fractions Treated to Date: 21
Plan Prescribed Dose Per Fraction: 2 Gy
Plan Total Fractions Prescribed: 33
Plan Total Prescribed Dose: 66 Gy
Reference Point Dosage Given to Date: 42 Gy
Reference Point Session Dosage Given: 2 Gy
Session Number: 21

## 2024-01-18 LAB — CMP (CANCER CENTER ONLY)
ALT: 22 U/L (ref 0–44)
AST: 27 U/L (ref 15–41)
Albumin: 4 g/dL (ref 3.5–5.0)
Alkaline Phosphatase: 65 U/L (ref 38–126)
Anion gap: 9 (ref 5–15)
BUN: 9 mg/dL (ref 6–20)
CO2: 27 mmol/L (ref 22–32)
Calcium: 9.7 mg/dL (ref 8.9–10.3)
Chloride: 103 mmol/L (ref 98–111)
Creatinine: 0.54 mg/dL (ref 0.44–1.00)
GFR, Estimated: >60 mL/min (ref >60)
Glucose, Bld: 90 mg/dL (ref 70–99)
Potassium: 4.5 mmol/L (ref 3.5–5.1)
Sodium: 139 mmol/L (ref 135–145)
Total Bilirubin: 0.6 mg/dL (ref 0.0–1.2)
Total Protein: 7.7 g/dL (ref 6.5–8.1)

## 2024-01-18 LAB — CBC WITH DIFFERENTIAL (CANCER CENTER ONLY)
Abs Immature Granulocytes: 0 K/uL (ref 0.00–0.07)
Basophils Absolute: 0 K/uL (ref 0.0–0.1)
Basophils Relative: 1 %
Eosinophils Absolute: 0.1 K/uL (ref 0.0–0.5)
Eosinophils Relative: 3 %
HCT: 28 % — ABNORMAL LOW (ref 36.0–46.0)
Hemoglobin: 9.3 g/dL — ABNORMAL LOW (ref 12.0–15.0)
Immature Granulocytes: 0 %
Lymphocytes Relative: 27 %
Lymphs Abs: 0.5 K/uL — ABNORMAL LOW (ref 0.7–4.0)
MCH: 29.1 pg (ref 26.0–34.0)
MCHC: 33.2 g/dL (ref 30.0–36.0)
MCV: 87.5 fL (ref 80.0–100.0)
Monocytes Absolute: 0.1 K/uL (ref 0.1–1.0)
Monocytes Relative: 6 %
Neutro Abs: 1.1 K/uL — ABNORMAL LOW (ref 1.7–7.7)
Neutrophils Relative %: 63 %
Platelet Count: 125 K/uL — ABNORMAL LOW (ref 150–400)
RBC: 3.2 MIL/uL — ABNORMAL LOW (ref 3.87–5.11)
RDW: 15.2 % (ref 11.5–15.5)
WBC Count: 1.7 K/uL — ABNORMAL LOW (ref 4.0–10.5)
nRBC: 0 % (ref 0.0–0.2)

## 2024-01-19 ENCOUNTER — Other Ambulatory Visit: Payer: Self-pay

## 2024-01-19 ENCOUNTER — Ambulatory Visit
Admission: RE | Admit: 2024-01-19 | Discharge: 2024-01-19 | Disposition: A | Discharge status: Home / Self Care | Source: Ambulatory Visit | Attending: Radiation Oncology | Admitting: Radiation Oncology

## 2024-01-19 DIAGNOSIS — Z510 Encounter for antineoplastic radiation therapy: Secondary | ICD-10-CM | POA: Diagnosis not present

## 2024-01-19 LAB — RAD ONC ARIA SESSION SUMMARY
Course Elapsed Days: 42
Plan Fractions Treated to Date: 22
Plan Prescribed Dose Per Fraction: 2 Gy
Plan Total Fractions Prescribed: 33
Plan Total Prescribed Dose: 66 Gy
Reference Point Dosage Given to Date: 44 Gy
Reference Point Session Dosage Given: 2 Gy
Session Number: 22

## 2024-01-20 ENCOUNTER — Ambulatory Visit
Admission: RE | Admit: 2024-01-20 | Discharge: 2024-01-20 | Disposition: A | Discharge status: Home / Self Care | Source: Ambulatory Visit | Attending: Radiation Oncology | Admitting: Radiation Oncology

## 2024-01-20 ENCOUNTER — Other Ambulatory Visit: Payer: Self-pay

## 2024-01-20 DIAGNOSIS — Z510 Encounter for antineoplastic radiation therapy: Secondary | ICD-10-CM | POA: Diagnosis not present

## 2024-01-20 LAB — RAD ONC ARIA SESSION SUMMARY
Course Elapsed Days: 43
Plan Fractions Treated to Date: 23
Plan Prescribed Dose Per Fraction: 2 Gy
Plan Total Fractions Prescribed: 33
Plan Total Prescribed Dose: 66 Gy
Reference Point Dosage Given to Date: 46 Gy
Reference Point Session Dosage Given: 2 Gy
Session Number: 23

## 2024-01-21 ENCOUNTER — Other Ambulatory Visit: Payer: Self-pay

## 2024-01-21 ENCOUNTER — Ambulatory Visit
Admission: RE | Admit: 2024-01-21 | Discharge: 2024-01-21 | Disposition: A | Source: Ambulatory Visit | Attending: Radiation Oncology

## 2024-01-21 DIAGNOSIS — Z510 Encounter for antineoplastic radiation therapy: Secondary | ICD-10-CM | POA: Diagnosis not present

## 2024-01-21 LAB — RAD ONC ARIA SESSION SUMMARY
Course Elapsed Days: 44
Plan Fractions Treated to Date: 24
Plan Prescribed Dose Per Fraction: 2 Gy
Plan Total Fractions Prescribed: 33
Plan Total Prescribed Dose: 66 Gy
Reference Point Dosage Given to Date: 48 Gy
Reference Point Session Dosage Given: 2 Gy
Session Number: 24

## 2024-01-24 ENCOUNTER — Ambulatory Visit

## 2024-01-24 ENCOUNTER — Telehealth: Payer: Self-pay | Admitting: Radiation Oncology

## 2024-01-24 NOTE — Telephone Encounter (Signed)
 [name] daughter called asking to delay pt's tx appt today since she has a last min dr's appt that was added onto her schedule the same time as pt's tx. Call transferred to Support RTT.

## 2024-01-25 ENCOUNTER — Ambulatory Visit

## 2024-01-25 ENCOUNTER — Other Ambulatory Visit: Payer: Self-pay | Admitting: Physician Assistant

## 2024-01-25 ENCOUNTER — Inpatient Hospital Stay

## 2024-01-25 ENCOUNTER — Ambulatory Visit: Admission: RE | Admit: 2024-01-25 | Source: Ambulatory Visit

## 2024-01-25 DIAGNOSIS — Z510 Encounter for antineoplastic radiation therapy: Secondary | ICD-10-CM | POA: Diagnosis not present

## 2024-01-25 DIAGNOSIS — J984 Other disorders of lung: Secondary | ICD-10-CM

## 2024-01-25 LAB — CMP (CANCER CENTER ONLY)
ALT: 12 U/L (ref 0–44)
AST: 20 U/L (ref 15–41)
Albumin: 4.1 g/dL (ref 3.5–5.0)
Alkaline Phosphatase: 68 U/L (ref 38–126)
Anion gap: 9 (ref 5–15)
BUN: 12 mg/dL (ref 6–20)
CO2: 27 mmol/L (ref 22–32)
Calcium: 9.7 mg/dL (ref 8.9–10.3)
Chloride: 103 mmol/L (ref 98–111)
Creatinine: 0.74 mg/dL (ref 0.44–1.00)
GFR, Estimated: >60 mL/min (ref >60)
Glucose, Bld: 98 mg/dL (ref 70–99)
Potassium: 4.1 mmol/L (ref 3.5–5.1)
Sodium: 138 mmol/L (ref 135–145)
Total Bilirubin: 0.5 mg/dL (ref 0.0–1.2)
Total Protein: 7.9 g/dL (ref 6.5–8.1)

## 2024-01-25 LAB — CBC WITH DIFFERENTIAL (CANCER CENTER ONLY)
Abs Immature Granulocytes: 0 K/uL (ref 0.00–0.07)
Basophils Absolute: 0 K/uL (ref 0.0–0.1)
Basophils Relative: 0 %
Eosinophils Absolute: 0 K/uL (ref 0.0–0.5)
Eosinophils Relative: 1 %
HCT: 30.4 % — ABNORMAL LOW (ref 36.0–46.0)
Hemoglobin: 10.2 g/dL — ABNORMAL LOW (ref 12.0–15.0)
Immature Granulocytes: 0 %
Lymphocytes Relative: 29 %
Lymphs Abs: 0.5 K/uL — ABNORMAL LOW (ref 0.7–4.0)
MCH: 29.3 pg (ref 26.0–34.0)
MCHC: 33.6 g/dL (ref 30.0–36.0)
MCV: 87.4 fL (ref 80.0–100.0)
Monocytes Absolute: 0.4 K/uL (ref 0.1–1.0)
Monocytes Relative: 19 %
Neutro Abs: 0.9 K/uL — ABNORMAL LOW (ref 1.7–7.7)
Neutrophils Relative %: 51 %
Platelet Count: 155 K/uL (ref 150–400)
RBC: 3.48 MIL/uL — ABNORMAL LOW (ref 3.87–5.11)
RDW: 16.2 % — ABNORMAL HIGH (ref 11.5–15.5)
WBC Count: 1.8 K/uL — ABNORMAL LOW (ref 4.0–10.5)
nRBC: 0 % (ref 0.0–0.2)

## 2024-01-26 ENCOUNTER — Ambulatory Visit: Admission: RE | Admit: 2024-01-26 | Discharge: 2024-01-26 | Attending: Radiation Oncology

## 2024-01-26 ENCOUNTER — Ambulatory Visit

## 2024-01-26 ENCOUNTER — Other Ambulatory Visit: Payer: Self-pay

## 2024-01-26 DIAGNOSIS — Z510 Encounter for antineoplastic radiation therapy: Secondary | ICD-10-CM | POA: Diagnosis not present

## 2024-01-26 LAB — RAD ONC ARIA SESSION SUMMARY
Course Elapsed Days: 49
Plan Fractions Treated to Date: 25
Plan Prescribed Dose Per Fraction: 2 Gy
Plan Total Fractions Prescribed: 33
Plan Total Prescribed Dose: 66 Gy
Reference Point Dosage Given to Date: 50 Gy
Reference Point Session Dosage Given: 2 Gy
Session Number: 25

## 2024-01-27 ENCOUNTER — Other Ambulatory Visit: Payer: Self-pay

## 2024-01-27 ENCOUNTER — Ambulatory Visit: Admission: RE | Admit: 2024-01-27 | Discharge: 2024-01-27 | Attending: Radiation Oncology

## 2024-01-27 ENCOUNTER — Encounter: Payer: Self-pay | Admitting: Licensed Clinical Social Worker

## 2024-01-27 ENCOUNTER — Ambulatory Visit
Admission: RE | Admit: 2024-01-27 | Discharge: 2024-01-27 | Disposition: A | Source: Ambulatory Visit | Attending: Radiation Oncology

## 2024-01-27 ENCOUNTER — Encounter: Payer: Self-pay | Admitting: Internal Medicine

## 2024-01-27 ENCOUNTER — Ambulatory Visit

## 2024-01-27 ENCOUNTER — Other Ambulatory Visit (HOSPITAL_BASED_OUTPATIENT_CLINIC_OR_DEPARTMENT_OTHER): Payer: Self-pay

## 2024-01-27 ENCOUNTER — Other Ambulatory Visit: Payer: Self-pay | Admitting: Radiation Oncology

## 2024-01-27 DIAGNOSIS — C3411 Malignant neoplasm of upper lobe, right bronchus or lung: Secondary | ICD-10-CM

## 2024-01-27 DIAGNOSIS — Z510 Encounter for antineoplastic radiation therapy: Secondary | ICD-10-CM | POA: Diagnosis not present

## 2024-01-27 LAB — RAD ONC ARIA SESSION SUMMARY
Course Elapsed Days: 50
Plan Fractions Treated to Date: 26
Plan Prescribed Dose Per Fraction: 2 Gy
Plan Total Fractions Prescribed: 33
Plan Total Prescribed Dose: 66 Gy
Reference Point Dosage Given to Date: 52 Gy
Reference Point Session Dosage Given: 2 Gy
Session Number: 26

## 2024-01-27 MED ORDER — HYDROCODONE BIT-HOMATROP MBR 5-1.5 MG/5ML PO SOLN
5.0000 mL | Freq: Four times a day (QID) | ORAL | 0 refills | Status: AC | PRN
  Filled 2024-01-27: qty 120, 6d supply, fill #0

## 2024-01-28 ENCOUNTER — Other Ambulatory Visit: Payer: Self-pay

## 2024-01-28 ENCOUNTER — Other Ambulatory Visit (HOSPITAL_BASED_OUTPATIENT_CLINIC_OR_DEPARTMENT_OTHER): Payer: Self-pay

## 2024-01-28 ENCOUNTER — Ambulatory Visit

## 2024-01-28 ENCOUNTER — Inpatient Hospital Stay: Admitting: Licensed Clinical Social Worker

## 2024-01-28 ENCOUNTER — Ambulatory Visit: Admission: RE | Admit: 2024-01-28 | Discharge: 2024-01-28 | Attending: Radiation Oncology

## 2024-01-28 DIAGNOSIS — C3491 Malignant neoplasm of unspecified part of right bronchus or lung: Secondary | ICD-10-CM

## 2024-01-28 DIAGNOSIS — Z510 Encounter for antineoplastic radiation therapy: Secondary | ICD-10-CM | POA: Diagnosis not present

## 2024-01-28 LAB — RAD ONC ARIA SESSION SUMMARY
Course Elapsed Days: 51
Plan Fractions Treated to Date: 27
Plan Prescribed Dose Per Fraction: 2 Gy
Plan Total Fractions Prescribed: 33
Plan Total Prescribed Dose: 66 Gy
Reference Point Dosage Given to Date: 54 Gy
Reference Point Session Dosage Given: 2 Gy
Session Number: 27

## 2024-01-28 NOTE — Progress Notes (Signed)
 [facility] CSW Progress Note    Interventions: CSW received a referral for pt in need of clothing.  CSW provided radiation w/ two $50 [employer] gift cards to give to pt to purchase clothing.      Follow Up Plan:  Patient will contact CSW with any support or resource needs    Devere JONELLE Manna, LCSW Clinical Social Worker [facility]    Patient is participating in a Managed Medicaid Plan:  Yes

## 2024-01-28 NOTE — Progress Notes (Signed)
 Patient met in the main cancer center lobby.  She was given 2 gift cards provided by the social work department.

## 2024-01-30 NOTE — Progress Notes (Unsigned)
 [facility] OFFICE PROGRESS NOTE  [facility], [facility] [address] [place] [address]  DIAGNOSIS: stage IIIC (T3, N3, M0) non-small cell lung cancer, squamous cell carcinoma presented with cavitary right upper lobe lung mass in addition to mediastinal lymphadenopathy and there is concern also about possibility of bilateral supraclavicular lymph nodes diagnosed in September 2025   PDL1: insufficient  material   PRIOR THERAPY: None  CURRENT THERAPY:  A course of concurrent chemoradiation with weekly carboplatin  for AUC of 2 and paclitaxel  45 MGs/M2 status post 3 cycles.   INTERVAL HISTORY: Andrea Mullen 58 y.o. female returns to the clinic today for follow-up visit accompanied by her daughter Andrea Mullen.  The patient was last seen in the clinic by my colleague on 01/18/2024.  The patient is currently undergoing a course of concurrent chemoradiation.  The last 2 weeks of treatment were held secondary to neutropenia.   Her last day radiation is scheduled for 02/09/2024.    She reports no acute symptoms and maintains adequate oral hydration. She is taking Eliquis  for anticoagulation and is not on antihypertensive medications.  She describes fatigue as she had to get up early for her appointment today. However, he experiences persistent sleep disturbance, with difficulty initiating and maintaining sleep despite attempts with melatonin and Tylenol ; she has taken more than the recommended dose of Tylenol  on one occasion. Her daughter had to take this away from her.   She experiences intermittent nausea, including an episode on the morning of the visit, but denies vomiting. Constipation occurs at times, for which she has Miralax  available but does not use regularly. She denies diarrhea. She drinks three bottles of water daily and notes worsened symptoms when not well hydrated.  Respiratory symptoms include a chronic cough productive of phlegm, unchanged from  baseline. She sometimes induces coughing to clear mucus. She denies hemoptysis, shortness of breath at rest, or recent changes in breathing. She was prescribed a cough medicine by her radiation oncologist on December 18 but had not picked it up prior to this visit. She denies headaches or vision changes.  She is here today for evaluation repeat blood work before undergoing cycle #4.   MEDICAL HISTORY: Past Medical History:  Diagnosis Date   Anxiety    Asthma    Blind    left eye   Cancer (HCC)    lung, tx radiation and chemo   Hyperlipemia    Hypertension    Stroke (HCC)     ALLERGIES:  is allergic to bicillin c-r, penicillins, nsaids, and prednisone .  MEDICATIONS:  Current Outpatient Medications  Medication Sig Dispense Refill   albuterol  (PROVENTIL ) (2.5 MG/3ML) 0.083% nebulizer solution Take 3 mLs (2.5 mg total) by nebulization every 6 (six) hours as needed for wheezing or shortness of breath. (Patient not taking: Reported on 11/30/2023) 75 mL 12   albuterol  (VENTOLIN  HFA) 108 (90 Base) MCG/ACT inhaler Inhale 2 puffs into the lungs every 4 (four) hours as needed for wheezing or shortness of breath. 18 g 0   albuterol  (VENTOLIN  HFA) 108 (90 Base) MCG/ACT inhaler Inhale 2 puffs into the lungs every 6 (six) hours as needed for wheezing or shortness of breath. 8.5 g 2   apixaban  (ELIQUIS ) 5 MG TABS tablet Take 1 tablet (5 mg total) by mouth 2 (two) times daily. 60 tablet 3   cyclobenzaprine  (FLEXERIL ) 5 MG tablet Take 1 tablet (5 mg total) by mouth 3 (three) times daily as needed for muscle spasms.  30 tablet 0   HYDROcodone  bit-homatropine (HYCODAN) 5-1.5 MG/5ML syrup Take 5 mLs by mouth every 6 (six) hours as needed for cough. 120 mL 0   HYDROcodone -acetaminophen  (NORCO/VICODIN) 5-325 MG tablet Take 1 tablet by mouth every 6 (six) hours as needed for moderate pain (pain score 4-6). 20 tablet 0   lidocaine -prilocaine  (EMLA ) cream Apply to affected area once 30 g 3   nicotine  (NICODERM  CQ - DOSED IN MG/24 HOURS) 14 mg/24hr patch Place 1 patch (14 mg total) onto the skin daily. Apply 14mg  patch daily x 6 wk, then 7 mg patch daily x 2 wk 14 patch 2   nicotine  (NICODERM CQ  - DOSED IN MG/24 HR) 7 mg/24hr patch Place 1 patch (7 mg total) onto the skin daily. Apply 14mg  patch daily x 6 wk, then 7 mg patch daily x 2 wk 14 patch 0   Nystatin (GERHARDT'S BUTT CREAM) CREA Apply 1 Application topically 3 (three) times daily. Apply to affected area 60 each 3   ondansetron  (ZOFRAN ) 8 MG tablet Take 1 tablet (8 mg total) by mouth every 8 (eight) hours as needed for nausea or vomiting. Start on the third day after chemotherapy. 30 tablet 1   polyethylene glycol powder (GLYCOLAX /MIRALAX ) 17 GM/SCOOP powder Take 17 g by mouth 2 (two) times daily as needed for mild constipation or moderate constipation. Dissolve 1 capful (17g) in 4-8 ounces of liquid and take by mouth daily. (Patient not taking: Reported on 11/30/2023) 238 g 0   prochlorperazine  (COMPAZINE ) 10 MG tablet Take 1 tablet (10 mg total) by mouth every 6 (six) hours as needed. 30 tablet 2   rosuvastatin  (CRESTOR ) 40 MG tablet Take 1 tablet (40 mg total) by mouth daily. (Patient not taking: Reported on 11/30/2023) 30 tablet 3   senna-docusate (SENOKOT-S) 8.6-50 MG tablet Take 1 tablet by mouth 2 (two) times daily. 60 tablet 0   No current facility-administered medications for this visit.    SURGICAL HISTORY:  Past Surgical History:  Procedure Laterality Date   CARDIAC CATHETERIZATION N/A 11/01/2014   Procedure: Left Heart Cath and Coronary Angiography;  Surgeon: Victory LELON Sharps, MD;  Location: [facility] INVASIVE CV LAB;  Service: Cardiovascular;  Laterality: N/A;   CESAREAN SECTION     x3   HERNIA REPAIR     VIDEO BRONCHOSCOPY WITH ENDOBRONCHIAL ULTRASOUND Right 10/22/2023   Procedure: BRONCHOSCOPY, WITH EBUS;  Surgeon: Jude Harden GAILS, MD;  Location[facility] ENDOSCOPY;  Service: Cardiopulmonary;  Laterality: Right;    REVIEW OF SYSTEMS:   Review  of Systems  Constitutional: Positive for fatigue. Negative for appetite change, chills, fever and unexpected weight change.  HENT: Negative for mouth sores, nosebleeds, sore throat and trouble swallowing.   Eyes: Negative for eye problems and icterus.  Respiratory: Positive for stable cough. Negative for hemoptysis, shortness of breath and wheezing.   Cardiovascular: Negative for chest pain and leg swelling.  Gastrointestinal: mild nausea this AM. Negative for abdominal pain, constipation, diarrhea, and vomiting.  Genitourinary: Negative for bladder incontinence, difficulty urinating, dysuria, frequency and hematuria.   Musculoskeletal: Negative for back pain, gait problem, neck pain and neck stiffness.  Skin: Negative for itching and rash.  Neurological: Negative for dizziness, extremity weakness, gait problem, headaches, light-headedness and seizures.  Hematological: Negative for adenopathy. Does not bruise/bleed easily.  Psychiatric/Behavioral: Negative for confusion, depression and sleep disturbance. The patient is not nervous/anxious.     PHYSICAL EXAMINATION:  Blood pressure 97/60, pulse 86, temperature 97.9 F (36.6 C), temperature source Temporal,  resp. rate 18, weight 203 lb 14.4 oz (92.5 kg), last menstrual period 11/16/2016, SpO2 95%.  ECOG PERFORMANCE STATUS: 1  Physical Exam  Constitutional: Oriented to person, place, and time and well-developed, well-nourished, and in no distress.  HENT:  Head: Normocephalic and atraumatic.  Mouth/Throat: Oropharynx is clear and moist. No oropharyngeal exudate.  Eyes: Conjunctivae are normal. Right eye exhibits no discharge. Left eye exhibits no discharge. No scleral icterus.  Neck: Normal range of motion. Neck supple.  Cardiovascular: Normal rate, regular rhythm, normal heart sounds and intact distal pulses.   Pulmonary/Chest: Effort normal. Rhonchi in right lung that clear with cough. No respiratory distress. No wheezes.  Abdominal:  Soft. Bowel sounds are normal. Exhibits no distension and no mass. There is no tenderness.  Musculoskeletal: Normal range of motion. Exhibits no edema.  Lymphadenopathy:    No cervical adenopathy.  Neurological: Alert and oriented to person, place, and time. Exhibits normal muscle tone. Examined in the wheelchair.  Skin: Skin is warm and dry. No rash noted. Not diaphoretic. No erythema. No pallor.  Psychiatric: Mood, memory. Abnormal judgement and acts younger than age.  Vitals reviewed.  LABORATORY DATA: Lab Results  Component Value Date   WBC 2.2 (L) 02/01/2024   HGB 10.7 (L) 02/01/2024   HCT 32.1 (L) 02/01/2024   MCV 88.7 02/01/2024   PLT 132 (L) 02/01/2024      Chemistry      Component Value Date/Time   NA 140 02/01/2024 0809   K 4.4 02/01/2024 0809   CL 104 02/01/2024 0809   CO2 27 02/01/2024 0809   BUN 10 02/01/2024 0809   CREATININE 0.74 02/01/2024 0809   CREATININE 0.72 04/03/2021 0000      Component Value Date/Time   CALCIUM  9.8 02/01/2024 0809   ALKPHOS 68 02/01/2024 0809   AST 21 02/01/2024 0809   ALT 9 02/01/2024 0809   BILITOT 0.6 02/01/2024 0809       RADIOGRAPHIC STUDIES:  No results found.   ASSESSMENT/PLAN:  This is a 58 year old African-American female with stage IIIC (T3, N3, M0) non-small cell lung cancer, squamous cell carcinoma presented with cavitary right upper lobe lung mass in addition to mediastinal lymphadenopathy and there is concern also about possibility of bilateral supraclavicular lymph nodes diagnosed in September 2025  She is currently undergoing course of concurrent chemoradiation with weekly carboplatin  and paclitaxel .  Status post 3 cycles. The patient has been tolerating her treatment well except for the pancytopenia.  The last 2 weeks of treatment were held secondary to neutropenia.   Labs were reviewed. Her WBC is 2.2 and ANC 1.3. She missed the last two weeks of treatment due to it being cancelled due to neutropenia.  Reviewed with Dr. Sherrod. Recommend that she proceed with cycle #4 today scheduled.  I will arrange for restaging CT scan of the chest approximately 3 weeks after her last day radiation.  We will then see her back approximately 1 week later to review the results in the office and next apps.  As long as her labs permit, next week will be her last cycle of chemotherapy on 02/07/2024.  Hypotension Low blood pressure noted. Not on antihypertensives, reports adequate hydration. - Reinforced importance of maintaining adequate oral hydration at home. - I will arrange for 1/2 L over 1 hour today to run with treatment  Radiation oncology prescribed cough medication last week. I reminded them of this.   - Provided antiemetic for chemotherapy-associated nausea.  Recommend reading the bottle  closely of her medication and not to exceed dosing instructions.  The patient was advised to call immediately if she has any concerning symptoms in the interval. The patient voices understanding of current disease status and treatment options and is in agreement with the current care plan. All questions were answered. The patient knows to call the clinic with any problems, questions or concerns. We can certainly see the patient much sooner if necessary   Orders Placed This Encounter  Procedures   CT Chest W Contrast    Standing Status:   Future    Expected Date:   03/01/2024    Expiration Date:   01/31/2025    If indicated for the ordered procedure, I authorize the administration of contrast media per Radiology protocol:   Yes    Does the patient have a contrast media/X-ray dye allergy?:   No    Is patient pregnant?:   No    Preferred imaging location?:   [facility]     The total time spent in the appointment was 20-29 minutes  Quaron Delacruz L Jaiyden Laur, PA-C 02/01/2024

## 2024-01-31 ENCOUNTER — Ambulatory Visit: Admission: RE | Admit: 2024-01-31 | Discharge: 2024-01-31 | Attending: Radiation Oncology

## 2024-01-31 ENCOUNTER — Ambulatory Visit

## 2024-01-31 ENCOUNTER — Other Ambulatory Visit: Payer: Self-pay

## 2024-01-31 ENCOUNTER — Ambulatory Visit
Admission: RE | Admit: 2024-01-31 | Discharge: 2024-01-31 | Disposition: A | Discharge status: Home / Self Care | Source: Ambulatory Visit | Attending: Radiation Oncology | Admitting: Radiation Oncology

## 2024-01-31 DIAGNOSIS — Z510 Encounter for antineoplastic radiation therapy: Secondary | ICD-10-CM | POA: Diagnosis not present

## 2024-01-31 LAB — RAD ONC ARIA SESSION SUMMARY
Course Elapsed Days: 54
Plan Fractions Treated to Date: 28
Plan Prescribed Dose Per Fraction: 2 Gy
Plan Total Fractions Prescribed: 33
Plan Total Prescribed Dose: 66 Gy
Reference Point Dosage Given to Date: 56 Gy
Reference Point Session Dosage Given: 2 Gy
Session Number: 28

## 2024-02-01 ENCOUNTER — Other Ambulatory Visit: Payer: Self-pay

## 2024-02-01 ENCOUNTER — Ambulatory Visit
Admission: RE | Admit: 2024-02-01 | Discharge: 2024-02-01 | Disposition: A | Discharge status: Home / Self Care | Source: Ambulatory Visit | Attending: Radiation Oncology | Admitting: Radiation Oncology

## 2024-02-01 ENCOUNTER — Inpatient Hospital Stay

## 2024-02-01 ENCOUNTER — Telehealth: Payer: Self-pay | Admitting: Physician Assistant

## 2024-02-01 ENCOUNTER — Ambulatory Visit

## 2024-02-01 ENCOUNTER — Inpatient Hospital Stay: Admitting: Physician Assistant

## 2024-02-01 VITALS — BP 97/60 | HR 86 | Temp 97.9°F | Resp 18 | Wt 203.9 lb

## 2024-02-01 VITALS — BP 119/76 | HR 99 | Temp 97.9°F | Resp 20

## 2024-02-01 DIAGNOSIS — Z5111 Encounter for antineoplastic chemotherapy: Secondary | ICD-10-CM

## 2024-02-01 DIAGNOSIS — Z510 Encounter for antineoplastic radiation therapy: Secondary | ICD-10-CM | POA: Diagnosis not present

## 2024-02-01 DIAGNOSIS — C3491 Malignant neoplasm of unspecified part of right bronchus or lung: Secondary | ICD-10-CM | POA: Diagnosis not present

## 2024-02-01 DIAGNOSIS — J984 Other disorders of lung: Secondary | ICD-10-CM

## 2024-02-01 DIAGNOSIS — I959 Hypotension, unspecified: Secondary | ICD-10-CM | POA: Diagnosis not present

## 2024-02-01 LAB — CMP (CANCER CENTER ONLY)
ALT: 9 U/L (ref 0–44)
AST: 21 U/L (ref 15–41)
Albumin: 4.2 g/dL (ref 3.5–5.0)
Alkaline Phosphatase: 68 U/L (ref 38–126)
Anion gap: 9 (ref 5–15)
BUN: 10 mg/dL (ref 6–20)
CO2: 27 mmol/L (ref 22–32)
Calcium: 9.8 mg/dL (ref 8.9–10.3)
Chloride: 104 mmol/L (ref 98–111)
Creatinine: 0.74 mg/dL (ref 0.44–1.00)
GFR, Estimated: >60 mL/min
Glucose, Bld: 92 mg/dL (ref 70–99)
Potassium: 4.4 mmol/L (ref 3.5–5.1)
Sodium: 140 mmol/L (ref 135–145)
Total Bilirubin: 0.6 mg/dL (ref 0.0–1.2)
Total Protein: 7.9 g/dL (ref 6.5–8.1)

## 2024-02-01 LAB — RAD ONC ARIA SESSION SUMMARY
Course Elapsed Days: 55
Plan Fractions Treated to Date: 29
Plan Prescribed Dose Per Fraction: 2 Gy
Plan Total Fractions Prescribed: 33
Plan Total Prescribed Dose: 66 Gy
Reference Point Dosage Given to Date: 58 Gy
Reference Point Session Dosage Given: 2 Gy
Session Number: 29

## 2024-02-01 LAB — CBC WITH DIFFERENTIAL (CANCER CENTER ONLY)
Abs Immature Granulocytes: 0.01 K/uL (ref 0.00–0.07)
Basophils Absolute: 0 K/uL (ref 0.0–0.1)
Basophils Relative: 0 %
Eosinophils Absolute: 0 K/uL (ref 0.0–0.5)
Eosinophils Relative: 0 %
HCT: 32.1 % — ABNORMAL LOW (ref 36.0–46.0)
Hemoglobin: 10.7 g/dL — ABNORMAL LOW (ref 12.0–15.0)
Immature Granulocytes: 1 %
Lymphocytes Relative: 21 %
Lymphs Abs: 0.5 K/uL — ABNORMAL LOW (ref 0.7–4.0)
MCH: 29.6 pg (ref 26.0–34.0)
MCHC: 33.3 g/dL (ref 30.0–36.0)
MCV: 88.7 fL (ref 80.0–100.0)
Monocytes Absolute: 0.4 K/uL (ref 0.1–1.0)
Monocytes Relative: 18 %
Neutro Abs: 1.3 K/uL — ABNORMAL LOW (ref 1.7–7.7)
Neutrophils Relative %: 60 %
Platelet Count: 132 K/uL — ABNORMAL LOW (ref 150–400)
RBC: 3.62 MIL/uL — ABNORMAL LOW (ref 3.87–5.11)
RDW: 17 % — ABNORMAL HIGH (ref 11.5–15.5)
WBC Count: 2.2 K/uL — ABNORMAL LOW (ref 4.0–10.5)
nRBC: 0 % (ref 0.0–0.2)

## 2024-02-01 MED ORDER — PALONOSETRON HCL INJECTION 0.25 MG/5ML
0.2500 mg | Freq: Once | INTRAVENOUS | Status: AC
  Administered 2024-02-01: 0.25 mg via INTRAVENOUS
  Filled 2024-02-01: qty 5

## 2024-02-01 MED ORDER — SODIUM CHLORIDE 0.9 % IV SOLN
246.8000 mg | Freq: Once | INTRAVENOUS | Status: AC
  Administered 2024-02-01: 250 mg via INTRAVENOUS
  Filled 2024-02-01: qty 25

## 2024-02-01 MED ORDER — FAMOTIDINE IN NACL 20-0.9 MG/50ML-% IV SOLN
20.0000 mg | Freq: Once | INTRAVENOUS | Status: AC
  Administered 2024-02-01: 20 mg via INTRAVENOUS
  Filled 2024-02-01: qty 50

## 2024-02-01 MED ORDER — SODIUM CHLORIDE 0.9 % IV SOLN: Freq: Once | INTRAVENOUS | Status: AC

## 2024-02-01 MED ORDER — DIPHENHYDRAMINE HCL 50 MG/ML IJ SOLN
50.0000 mg | Freq: Once | INTRAMUSCULAR | Status: AC
  Administered 2024-02-01: 50 mg via INTRAVENOUS
  Filled 2024-02-01: qty 1

## 2024-02-01 MED ORDER — SODIUM CHLORIDE 0.9 % IV SOLN
45.0000 mg/m2 | Freq: Once | INTRAVENOUS | Status: AC
  Administered 2024-02-01: 84 mg via INTRAVENOUS
  Filled 2024-02-01: qty 14

## 2024-02-01 MED ORDER — SODIUM CHLORIDE 0.9 % IV SOLN: INTRAVENOUS | Status: DC

## 2024-02-01 NOTE — Telephone Encounter (Signed)
 Pt and daughter is aware of appt

## 2024-02-02 ENCOUNTER — Encounter: Payer: Self-pay | Admitting: Internal Medicine

## 2024-02-02 ENCOUNTER — Ambulatory Visit

## 2024-02-02 ENCOUNTER — Other Ambulatory Visit: Payer: Self-pay

## 2024-02-02 ENCOUNTER — Ambulatory Visit
Admission: RE | Admit: 2024-02-02 | Discharge: 2024-02-02 | Disposition: A | Source: Ambulatory Visit | Attending: Radiation Oncology

## 2024-02-02 DIAGNOSIS — Z510 Encounter for antineoplastic radiation therapy: Secondary | ICD-10-CM | POA: Diagnosis not present

## 2024-02-02 LAB — RAD ONC ARIA SESSION SUMMARY
Course Elapsed Days: 56
Plan Fractions Treated to Date: 30
Plan Prescribed Dose Per Fraction: 2 Gy
Plan Total Fractions Prescribed: 33
Plan Total Prescribed Dose: 66 Gy
Reference Point Dosage Given to Date: 60 Gy
Reference Point Session Dosage Given: 2 Gy
Session Number: 30

## 2024-02-03 ENCOUNTER — Ambulatory Visit

## 2024-02-04 ENCOUNTER — Encounter: Payer: Self-pay | Admitting: Internal Medicine

## 2024-02-04 NOTE — Progress Notes (Signed)
 Received email from patient's daughter asking about resources.  Called patient directly to do financial introduction and person whom answered was a daughter whom states she is on the ROI but was not the daughter whom sent the email it was her sister. Asked her to have patient to call me at her earliest convenience when she awakes. She verbalized understanding.  She has my name and contact number.

## 2024-02-07 ENCOUNTER — Ambulatory Visit

## 2024-02-07 ENCOUNTER — Ambulatory Visit
Admission: RE | Admit: 2024-02-07 | Discharge: 2024-02-07 | Disposition: A | Discharge status: Home / Self Care | Source: Ambulatory Visit | Attending: Radiation Oncology | Admitting: Radiation Oncology

## 2024-02-07 ENCOUNTER — Other Ambulatory Visit: Payer: Self-pay

## 2024-02-07 DIAGNOSIS — Z510 Encounter for antineoplastic radiation therapy: Secondary | ICD-10-CM | POA: Diagnosis not present

## 2024-02-07 LAB — RAD ONC ARIA SESSION SUMMARY
Course Elapsed Days: 61
Plan Fractions Treated to Date: 31
Plan Prescribed Dose Per Fraction: 2 Gy
Plan Total Fractions Prescribed: 33
Plan Total Prescribed Dose: 66 Gy
Reference Point Dosage Given to Date: 62 Gy
Reference Point Session Dosage Given: 2 Gy
Session Number: 31

## 2024-02-08 ENCOUNTER — Other Ambulatory Visit: Payer: Self-pay

## 2024-02-08 ENCOUNTER — Inpatient Hospital Stay

## 2024-02-08 ENCOUNTER — Ambulatory Visit
Admission: RE | Admit: 2024-02-08 | Discharge: 2024-02-08 | Disposition: A | Discharge status: Home / Self Care | Source: Ambulatory Visit | Attending: Radiation Oncology | Admitting: Radiation Oncology

## 2024-02-08 ENCOUNTER — Encounter: Payer: Self-pay | Admitting: Licensed Clinical Social Worker

## 2024-02-08 ENCOUNTER — Other Ambulatory Visit (HOSPITAL_BASED_OUTPATIENT_CLINIC_OR_DEPARTMENT_OTHER): Payer: Self-pay

## 2024-02-08 ENCOUNTER — Ambulatory Visit

## 2024-02-08 VITALS — BP 94/68 | HR 93 | Temp 98.3°F | Resp 17 | Wt 200.0 lb

## 2024-02-08 DIAGNOSIS — Z510 Encounter for antineoplastic radiation therapy: Secondary | ICD-10-CM | POA: Diagnosis not present

## 2024-02-08 DIAGNOSIS — C3491 Malignant neoplasm of unspecified part of right bronchus or lung: Secondary | ICD-10-CM

## 2024-02-08 LAB — CMP (CANCER CENTER ONLY)
ALT: 9 U/L (ref 0–44)
AST: 20 U/L (ref 15–41)
Albumin: 4.2 g/dL (ref 3.5–5.0)
Alkaline Phosphatase: 65 U/L (ref 38–126)
Anion gap: 9 (ref 5–15)
BUN: 12 mg/dL (ref 6–20)
CO2: 26 mmol/L (ref 22–32)
Calcium: 9.7 mg/dL (ref 8.9–10.3)
Chloride: 105 mmol/L (ref 98–111)
Creatinine: 0.65 mg/dL (ref 0.44–1.00)
GFR, Estimated: >60 mL/min
Glucose, Bld: 94 mg/dL (ref 70–99)
Potassium: 4 mmol/L (ref 3.5–5.1)
Sodium: 140 mmol/L (ref 135–145)
Total Bilirubin: 0.8 mg/dL (ref 0.0–1.2)
Total Protein: 7.8 g/dL (ref 6.5–8.1)

## 2024-02-08 LAB — CBC WITH DIFFERENTIAL (CANCER CENTER ONLY)
Abs Immature Granulocytes: 0.02 K/uL (ref 0.00–0.07)
Basophils Absolute: 0 K/uL (ref 0.0–0.1)
Basophils Relative: 1 %
Eosinophils Absolute: 0 K/uL (ref 0.0–0.5)
Eosinophils Relative: 2 %
HCT: 30.9 % — ABNORMAL LOW (ref 36.0–46.0)
Hemoglobin: 10.6 g/dL — ABNORMAL LOW (ref 12.0–15.0)
Immature Granulocytes: 1 %
Lymphocytes Relative: 24 %
Lymphs Abs: 0.4 K/uL — ABNORMAL LOW (ref 0.7–4.0)
MCH: 29.8 pg (ref 26.0–34.0)
MCHC: 34.3 g/dL (ref 30.0–36.0)
MCV: 86.8 fL (ref 80.0–100.0)
Monocytes Absolute: 0.1 K/uL (ref 0.1–1.0)
Monocytes Relative: 8 %
Neutro Abs: 1.1 K/uL — ABNORMAL LOW (ref 1.7–7.7)
Neutrophils Relative %: 64 %
Platelet Count: 93 K/uL — ABNORMAL LOW (ref 150–400)
RBC: 3.56 MIL/uL — ABNORMAL LOW (ref 3.87–5.11)
RDW: 16 % — ABNORMAL HIGH (ref 11.5–15.5)
WBC Count: 1.7 K/uL — ABNORMAL LOW (ref 4.0–10.5)
nRBC: 0 % (ref 0.0–0.2)

## 2024-02-08 LAB — RAD ONC ARIA SESSION SUMMARY
Course Elapsed Days: 62
Plan Fractions Treated to Date: 32
Plan Prescribed Dose Per Fraction: 2 Gy
Plan Total Fractions Prescribed: 33
Plan Total Prescribed Dose: 66 Gy
Reference Point Dosage Given to Date: 64 Gy
Reference Point Session Dosage Given: 2 Gy
Session Number: 32

## 2024-02-08 MED ORDER — DIPHENHYDRAMINE HCL 50 MG/ML IJ SOLN
50.0000 mg | Freq: Once | INTRAMUSCULAR | Status: AC
  Administered 2024-02-08: 50 mg via INTRAVENOUS
  Filled 2024-02-08: qty 1

## 2024-02-08 MED ORDER — FAMOTIDINE IN NACL 20-0.9 MG/50ML-% IV SOLN
20.0000 mg | Freq: Once | INTRAVENOUS | Status: AC
  Administered 2024-02-08: 20 mg via INTRAVENOUS
  Filled 2024-02-08: qty 50

## 2024-02-08 MED ORDER — SODIUM CHLORIDE 0.9 % IV SOLN
246.8000 mg | Freq: Once | INTRAVENOUS | Status: AC
  Administered 2024-02-08: 250 mg via INTRAVENOUS
  Filled 2024-02-08: qty 25

## 2024-02-08 MED ORDER — SODIUM CHLORIDE 0.9 % IV SOLN
45.0000 mg/m2 | Freq: Once | INTRAVENOUS | Status: AC
  Administered 2024-02-08: 84 mg via INTRAVENOUS
  Filled 2024-02-08: qty 14

## 2024-02-08 MED ORDER — PALONOSETRON HCL INJECTION 0.25 MG/5ML
0.2500 mg | Freq: Once | INTRAVENOUS | Status: AC
  Administered 2024-02-08: 0.25 mg via INTRAVENOUS
  Filled 2024-02-08: qty 5

## 2024-02-08 MED ORDER — SODIUM CHLORIDE 0.9 % IV SOLN: INTRAVENOUS | Status: DC

## 2024-02-08 NOTE — Patient Instructions (Signed)
 [facility] - A DEPT OF [facility]  Discharge Instructions: Thank you for choosing [facility] to provide your oncology and hematology care.   If you have a lab appointment with the Cancer Center, please go directly to the Cancer Center and check in at the registration area.   Wear comfortable clothing and clothing appropriate for easy access to any Portacath or PICC line.   We strive to give you quality time with your provider. You may need to reschedule your appointment if you arrive late (15 or more minutes).  Arriving late affects you and other patients whose appointments are after yours.  Also, if you miss three or more appointments without notifying the office, you may be dismissed from the clinic at the provider's discretion.      For prescription refill requests, have your pharmacy contact our office and allow 72 hours for refills to be completed.    Today you received the following chemotherapy and/or immunotherapy agents taxol and carboplatin      To help prevent nausea and vomiting after your treatment, we encourage you to take your nausea medication as directed.  BELOW ARE SYMPTOMS THAT SHOULD BE REPORTED IMMEDIATELY: *FEVER GREATER THAN 100.4 F (38 C) OR HIGHER *CHILLS OR SWEATING *NAUSEA AND VOMITING THAT IS NOT CONTROLLED WITH YOUR NAUSEA MEDICATION *UNUSUAL SHORTNESS OF BREATH *UNUSUAL BRUISING OR BLEEDING *URINARY PROBLEMS (pain or burning when urinating, or frequent urination) *BOWEL PROBLEMS (unusual diarrhea, constipation, pain near the anus) TENDERNESS IN MOUTH AND THROAT WITH OR WITHOUT PRESENCE OF ULCERS (sore throat, sores in mouth, or a toothache) UNUSUAL RASH, SWELLING OR PAIN  UNUSUAL VAGINAL DISCHARGE OR ITCHING   Items with * indicate a potential emergency and should be followed up as soon as possible or go to the Emergency Department if any problems should occur.  Please show the CHEMOTHERAPY ALERT CARD or  IMMUNOTHERAPY ALERT CARD at check-in to the Emergency Department and triage nurse.  Should you have questions after your visit or need to cancel or reschedule your appointment, please contact [facility] - A DEPT OF [facility]  Dept: [phone]  and follow the prompts.  Office hours are 8:00 a.m. to 4:30 p.m. Monday - Friday. Please note that voicemails left after 4:00 p.m. may not be returned until the following business day.  We are closed weekends and major holidays. You have access to a nurse at all times for urgent questions. Please call the main number to the clinic Dept: [phone] and follow the prompts.   For any non-urgent questions, you may also contact your provider using MyChart. We now offer e-Visits for anyone 62 and older to request care online for non-urgent symptoms. For details visit [email or web address].   Also download the MyChart app! Go to the app store, search "MyChart", open the app, select [place], and log in with your MyChart username and password.

## 2024-02-09 ENCOUNTER — Ambulatory Visit

## 2024-02-09 ENCOUNTER — Other Ambulatory Visit: Payer: Self-pay

## 2024-02-09 ENCOUNTER — Ambulatory Visit
Admission: RE | Admit: 2024-02-09 | Discharge: 2024-02-09 | Disposition: A | Discharge status: Home / Self Care | Source: Ambulatory Visit | Attending: Radiation Oncology | Admitting: Radiation Oncology

## 2024-02-09 DIAGNOSIS — Z510 Encounter for antineoplastic radiation therapy: Secondary | ICD-10-CM | POA: Diagnosis not present

## 2024-02-09 LAB — RAD ONC ARIA SESSION SUMMARY
Course Elapsed Days: 63
Plan Fractions Treated to Date: 33
Plan Prescribed Dose Per Fraction: 2 Gy
Plan Total Fractions Prescribed: 33
Plan Total Prescribed Dose: 66 Gy
Reference Point Dosage Given to Date: 66 Gy
Reference Point Session Dosage Given: 2 Gy
Session Number: 33

## 2024-02-10 NOTE — Radiation Completion Notes (Signed)
 Patient Name: Andrea Mullen, Andrea Mullen MRN: [id number] Date of Birth: [birth year] Referring Physician: SHERROD SHERROD, M.D. Date of Service: 2024-02-10 Radiation Oncologist: Lauraine Golden, M.D. [facility] [place]                             RADIATION ONCOLOGY END OF TREATMENT NOTE     Diagnosis: C34.11 Malignant neoplasm of upper lobe, right bronchus or lung Staging on 2023-11-03: Non-small cell lung cancer (HCC) T=cT3, N=cN2b, M=cM0 Intent: Curative     ==========DELIVERED PLANS==========  First Treatment Date: 2023-12-08 Last Treatment Date: 2024-02-09   Plan Name: Chest_R Site: Lung, Right Technique: IMRT Mode: Photon Dose Per Fraction: 2 Gy Prescribed Dose (Delivered / Prescribed): 66 Gy / 66 Gy Prescribed Fxs (Delivered / Prescribed): 33 / 33     ==========ON TREATMENT VISIT DATES========== 2023-12-13, 2023-12-20, 2023-12-31, 2024-01-03, 2024-01-10, 2024-01-17, 2024-01-27, 2024-01-31, 2024-02-08     ==========UPCOMING VISITS========== 03/13/2024 [facility] FOLLOW UP 20 Golden Lauraine, MD  03/07/2024 [facility]-MED ONCOLOGY EST PT 30 Heilingoetter, Cassandra L, PA-C  03/02/2024 [facility]-CT IMAGING CT CHEST W CONTRAST [facility] 2  03/01/2024 [facility] FU Rosemarie Eather RAMAN, MD        ==========APPENDIX - ON TREATMENT VISIT NOTES==========   See weekly On Treatment Notes in Epic for details in the Media tab (listed as Progress notes on the On Treatment Visit Dates listed above).

## 2024-02-11 ENCOUNTER — Encounter: Payer: Self-pay | Admitting: Internal Medicine

## 2024-02-28 NOTE — Progress Notes (Incomplete)
 Andrea Mullen is here today for follow up post radiation to the lung.  Lung Side: ***  Does the patient complain of any of the following: Pain:*** Shortness of breath w/wo exertion: *** Cough: *** Hemoptysis: *** Pain with swallowing: *** Swallowing/choking concerns: *** Appetite: *** Energy Level: *** Post radiation skin Changes: ***    Additional comments if applicable:

## 2024-03-01 ENCOUNTER — Inpatient Hospital Stay: Admitting: Neurology

## 2024-03-01 ENCOUNTER — Telehealth: Payer: Self-pay | Admitting: Neurology

## 2024-03-01 ENCOUNTER — Encounter: Payer: Self-pay | Admitting: Neurology

## 2024-03-01 NOTE — Telephone Encounter (Signed)
 Patient's daughter called to reschedule appointment on 05/25/24 at 10:30 am

## 2024-03-02 ENCOUNTER — Ambulatory Visit (HOSPITAL_COMMUNITY)

## 2024-03-02 ENCOUNTER — Other Ambulatory Visit: Payer: Self-pay

## 2024-03-06 NOTE — Progress Notes (Unsigned)
 [facility] OFFICE PROGRESS NOTE  Llc, [facility] [address] [place] [address]  DIAGNOSIS: stage IIIC (T3, N3, M0) non-small cell lung cancer, squamous cell carcinoma presented with cavitary right upper lobe lung mass in addition to mediastinal lymphadenopathy and there is concern also about possibility of bilateral supraclavicular lymph nodes diagnosed in September 2025    PDL1: insufficient  material   PRIOR THERAPY: A course of concurrent chemoradiation with weekly carboplatin  for AUC of 2 and paclitaxel  45 MGs/M2 status post 5 cycles. Last dose on 02/08/24  CURRENT THERAPY:   INTERVAL HISTORY: Andrea Mullen 59 y.o. female returns to the clinic today for a follow-up visit accompanied by ***.  She was last seen in the clinic by myself on 02/01/2024.  The patient completed a course of concurrent chemoradiation for total of 5 cycles.  She tolerated it fair.  She did experience some fatigue.  She denies any fever, chills, or night sweats.  Appetite?  She does have a chronic cough which is ***from baseline.  She denies any hemoptysis, shortness of breath at rest, or chest pain.  She denies any diarrhea.  She does have intermittent nausea which has since ***.  She denies any vomiting.  Constipation at time for which she takes MiraLAX .  She denies any headache or visual changes.  She recently had a restaging CT scan.  She continues to take her Eliquis  due to her history of blood clot.  She is here today to review her scan and for more detailed discussion about her current condition and treatment options. MEDICAL HISTORY: Past Medical History:  Diagnosis Date   Anxiety    Asthma    Blind    left eye   Cancer (HCC)    lung, tx radiation and chemo   Hyperlipemia    Hypertension    Stroke (HCC)     ALLERGIES:  is allergic to bicillin c-r, penicillins, nsaids, and prednisone .  MEDICATIONS:  Current Outpatient Medications  Medication Sig  Dispense Refill   albuterol  (PROVENTIL ) (2.5 MG/3ML) 0.083% nebulizer solution Take 3 mLs (2.5 mg total) by nebulization every 6 (six) hours as needed for wheezing or shortness of breath. (Patient not taking: Reported on 11/30/2023) 75 mL 12   albuterol  (VENTOLIN  HFA) 108 (90 Base) MCG/ACT inhaler Inhale 2 puffs into the lungs every 4 (four) hours as needed for wheezing or shortness of breath. 18 g 0   albuterol  (VENTOLIN  HFA) 108 (90 Base) MCG/ACT inhaler Inhale 2 puffs into the lungs every 6 (six) hours as needed for wheezing or shortness of breath. 8.5 g 2   apixaban  (ELIQUIS ) 5 MG TABS tablet Take 1 tablet (5 mg total) by mouth 2 (two) times daily. 60 tablet 3   cyclobenzaprine  (FLEXERIL ) 5 MG tablet Take 1 tablet (5 mg total) by mouth 3 (three) times daily as needed for muscle spasms. 30 tablet 0   HYDROcodone  bit-homatropine (HYCODAN) 5-1.5 MG/5ML syrup Take 5 mLs by mouth every 6 (six) hours as needed for cough. 120 mL 0   HYDROcodone -acetaminophen  (NORCO/VICODIN) 5-325 MG tablet Take 1 tablet by mouth every 6 (six) hours as needed for moderate pain (pain score 4-6). 20 tablet 0   lidocaine -prilocaine  (EMLA ) cream Apply to affected area once 30 g 3   nicotine  (NICODERM CQ  - DOSED IN MG/24 HOURS) 14 mg/24hr patch Place 1 patch (14 mg total) onto the skin daily. Apply 14mg  patch daily x 6 wk, then 7 mg patch daily  x 2 wk 14 patch 2   nicotine  (NICODERM CQ  - DOSED IN MG/24 HR) 7 mg/24hr patch Place 1 patch (7 mg total) onto the skin daily. Apply 14mg  patch daily x 6 wk, then 7 mg patch daily x 2 wk 14 patch 0   Nystatin (GERHARDT'S BUTT CREAM) CREA Apply 1 Application topically 3 (three) times daily. Apply to affected area 60 each 3   ondansetron  (ZOFRAN ) 8 MG tablet Take 1 tablet (8 mg total) by mouth every 8 (eight) hours as needed for nausea or vomiting. Start on the third day after chemotherapy. 30 tablet 1   polyethylene glycol powder (GLYCOLAX /MIRALAX ) 17 GM/SCOOP powder Take 17 g by mouth 2  (two) times daily as needed for mild constipation or moderate constipation. Dissolve 1 capful (17g) in 4-8 ounces of liquid and take by mouth daily. (Patient not taking: Reported on 11/30/2023) 238 g 0   prochlorperazine  (COMPAZINE ) 10 MG tablet Take 1 tablet (10 mg total) by mouth every 6 (six) hours as needed. 30 tablet 2   rosuvastatin  (CRESTOR ) 40 MG tablet Take 1 tablet (40 mg total) by mouth daily. (Patient not taking: Reported on 11/30/2023) 30 tablet 3   senna-docusate (SENOKOT-S) 8.6-50 MG tablet Take 1 tablet by mouth 2 (two) times daily. 60 tablet 0   No current facility-administered medications for this visit.    SURGICAL HISTORY:  Past Surgical History:  Procedure Laterality Date   CARDIAC CATHETERIZATION N/A 11/01/2014   Procedure: Left Heart Cath and Coronary Angiography;  Surgeon: Victory LELON Sharps, MD;  Location: [facility] INVASIVE CV LAB;  Service: Cardiovascular;  Laterality: N/A;   CESAREAN SECTION     x3   HERNIA REPAIR     VIDEO BRONCHOSCOPY WITH ENDOBRONCHIAL ULTRASOUND Right 10/22/2023   Procedure: BRONCHOSCOPY, WITH EBUS;  Surgeon: Jude Harden GAILS, MD;  Location: [facility] ENDOSCOPY;  Service: Cardiopulmonary;  Laterality: Right;    REVIEW OF SYSTEMS:   Review of Systems  Constitutional: Negative for appetite change, chills, fatigue, fever and unexpected weight change.  HENT:   Negative for mouth sores, nosebleeds, sore throat and trouble swallowing.   Eyes: Negative for eye problems and icterus.  Respiratory: Negative for cough, hemoptysis, shortness of breath and wheezing.   Cardiovascular: Negative for chest pain and leg swelling.  Gastrointestinal: Negative for abdominal pain, constipation, diarrhea, nausea and vomiting.  Genitourinary: Negative for bladder incontinence, difficulty urinating, dysuria, frequency and hematuria.   Musculoskeletal: Negative for back pain, gait problem, neck pain and neck stiffness.  Skin: Negative for itching and rash.  Neurological: Negative for  dizziness, extremity weakness, gait problem, headaches, light-headedness and seizures.  Hematological: Negative for adenopathy. Does not bruise/bleed easily.  Psychiatric/Behavioral: Negative for confusion, depression and sleep disturbance. The patient is not nervous/anxious.     PHYSICAL EXAMINATION:  Last menstrual period 11/16/2016.  ECOG PERFORMANCE STATUS: {CHL ONC ECOG [id number]  Physical Exam  Constitutional: Oriented to person, place, and time and well-developed, well-nourished, and in no distress. No distress.  HENT:  Head: Normocephalic and atraumatic.  Mouth/Throat: Oropharynx is clear and moist. No oropharyngeal exudate.  Eyes: Conjunctivae are normal. Right eye exhibits no discharge. Left eye exhibits no discharge. No scleral icterus.  Neck: Normal range of motion. Neck supple.  Cardiovascular: Normal rate, regular rhythm, normal heart sounds and intact distal pulses.   Pulmonary/Chest: Effort normal and breath sounds normal. No respiratory distress. No wheezes. No rales.  Abdominal: Soft. Bowel sounds are normal. Exhibits no distension and no mass. There is no tenderness.  Musculoskeletal: Normal range of motion. Exhibits no edema.  Lymphadenopathy:    No cervical adenopathy.  Neurological: Alert and oriented to person, place, and time. Exhibits normal muscle tone. Gait normal. Coordination normal.  Skin: Skin is warm and dry. No rash noted. Not diaphoretic. No erythema. No pallor.  Psychiatric: Mood, memory and judgment normal.  Vitals reviewed.  LABORATORY DATA: Lab Results  Component Value Date   WBC 1.7 (L) 02/08/2024   HGB 10.6 (L) 02/08/2024   HCT 30.9 (L) 02/08/2024   MCV 86.8 02/08/2024   PLT 93 (L) 02/08/2024      Chemistry      Component Value Date/Time   NA 140 02/08/2024 0748   K 4.0 02/08/2024 0748   CL 105 02/08/2024 0748   CO2 26 02/08/2024 0748   BUN 12 02/08/2024 0748   CREATININE 0.65 02/08/2024 0748   CREATININE 0.72 04/03/2021 0000       Component Value Date/Time   CALCIUM  9.7 02/08/2024 0748   ALKPHOS 65 02/08/2024 0748   AST 20 02/08/2024 0748   ALT 9 02/08/2024 0748   BILITOT 0.8 02/08/2024 0748       RADIOGRAPHIC STUDIES:  No results found.   ASSESSMENT/PLAN:  This is a 59 year old African-American female with stage IIIC (T3, N3, M0) non-small cell lung cancer, squamous cell carcinoma presented with cavitary right upper lobe lung mass in addition to mediastinal lymphadenopathy and there is concern also about possibility of bilateral supraclavicular lymph nodes diagnosed in September 2025   She completed 5 cycles of concurrent chemoradiation with carboplatin  for an AUC of 2 and paclitaxel  45 mg/m.  She is status post 5 cycles.  Her last day of treatment was on 02/08/2024.  The patient was seen with Dr. Sherrod today.  Dr. Sherrod personally and independently reviewed the scan and discussed results with the patient today.  The scan showed ***.  Dr. Sherrod recommends ***  Dr. Sherrod discussed consolidation immunotherapy with Imfinzi 1500 mg IV every 4 weeks.  The patient is interested in this option and she is expected to undergo her first cycle of treatment on 2//26.  I discussed with her the adverse effect of the immunotherapy including but not limited to immunotherapy mediated skin rash, diarrhea, inflammation of the lung, kidney, liver, thyroid  or other endocrine dysfunction  We will see her back for labs and a follow-up visit in 5 weeks before undergoing cycle #2.  Blood pressure?  Cough?  The patient was advised to call immediately if she has any concerning symptoms in the interval. The patient voices understanding of current disease status and treatment options and is in agreement with the current care plan. All questions were answered. The patient knows to call the clinic with any problems, questions or concerns. We can certainly see the patient much sooner if necessary   No orders of the  defined types were placed in this encounter.    I spent {CHL ONC TIME VISIT - DTPQU:[id number]} counseling the patient face to face. The total time spent in the appointment was {CHL ONC TIME VISIT - DTPQU:[id number]}.  Andrea Morici L Bhakti Labella, PA-C 03/06/24

## 2024-03-07 ENCOUNTER — Ambulatory Visit (HOSPITAL_COMMUNITY)

## 2024-03-07 ENCOUNTER — Inpatient Hospital Stay: Admitting: Physician Assistant

## 2024-03-09 ENCOUNTER — Inpatient Hospital Stay: Attending: Radiation Oncology | Admitting: Physician Assistant

## 2024-03-09 ENCOUNTER — Telehealth: Payer: Self-pay

## 2024-03-09 NOTE — Telephone Encounter (Signed)
 Tried to reach patients daughter, [name] in regards to appt with Cassie, PA today.  LVM stating that since patient has not received her CT scan yet, appt with Cassie will need to be rescheduled until after the CT scan is performed.  Asked for a return call to reschedule appt today.

## 2024-03-10 ENCOUNTER — Encounter: Payer: Self-pay | Admitting: Internal Medicine

## 2024-03-10 ENCOUNTER — Telehealth: Payer: Self-pay | Admitting: Physician Assistant

## 2024-03-10 NOTE — Telephone Encounter (Signed)
 Called pt daughter and scheduled the lab for the ct and the MD visit after the ct daughter is aware

## 2024-03-13 ENCOUNTER — Ambulatory Visit: Admitting: Radiation Oncology

## 2024-03-14 ENCOUNTER — Telehealth: Payer: Self-pay | Admitting: Radiation Oncology

## 2024-03-14 NOTE — Telephone Encounter (Signed)
 Unable to lvm to r/s cx 2/2 f/u appt.

## 2024-03-15 ENCOUNTER — Other Ambulatory Visit (HOSPITAL_BASED_OUTPATIENT_CLINIC_OR_DEPARTMENT_OTHER): Payer: Self-pay

## 2024-03-20 ENCOUNTER — Inpatient Hospital Stay: Attending: Radiation Oncology

## 2024-03-20 ENCOUNTER — Ambulatory Visit (HOSPITAL_COMMUNITY)

## 2024-03-28 ENCOUNTER — Inpatient Hospital Stay: Admitting: Physician Assistant

## 2024-05-25 ENCOUNTER — Inpatient Hospital Stay: Admitting: Neurology
